# Patient Record
Sex: Female | Born: 1935 | Race: White | Hispanic: No | Smoking: Former smoker
Health system: Southern US, Community
[De-identification: ages and names within clinical notes are randomized; demographics above are authoritative.]

## PROBLEM LIST (undated history)

## (undated) DIAGNOSIS — K2970 Gastritis, unspecified, without bleeding: Secondary | ICD-10-CM

## (undated) DIAGNOSIS — E0590 Thyrotoxicosis, unspecified without thyrotoxic crisis or storm: Secondary | ICD-10-CM

## (undated) DIAGNOSIS — K449 Diaphragmatic hernia without obstruction or gangrene: Secondary | ICD-10-CM

## (undated) DIAGNOSIS — D751 Secondary polycythemia: Secondary | ICD-10-CM

## (undated) DIAGNOSIS — R0989 Other specified symptoms and signs involving the circulatory and respiratory systems: Secondary | ICD-10-CM

## (undated) DIAGNOSIS — Z860100 Personal history of colon polyps, unspecified: Secondary | ICD-10-CM

## (undated) DIAGNOSIS — Z9889 Other specified postprocedural states: Secondary | ICD-10-CM

## (undated) DIAGNOSIS — Z720 Tobacco use: Secondary | ICD-10-CM

## (undated) DIAGNOSIS — E119 Type 2 diabetes mellitus without complications: Secondary | ICD-10-CM

## (undated) DIAGNOSIS — E785 Hyperlipidemia, unspecified: Secondary | ICD-10-CM

## (undated) DIAGNOSIS — K2270 Barrett's esophagus without dysplasia: Secondary | ICD-10-CM

## (undated) DIAGNOSIS — K219 Gastro-esophageal reflux disease without esophagitis: Secondary | ICD-10-CM

## (undated) DIAGNOSIS — I10 Essential (primary) hypertension: Secondary | ICD-10-CM

## (undated) DIAGNOSIS — J189 Pneumonia, unspecified organism: Secondary | ICD-10-CM

## (undated) DIAGNOSIS — N63 Unspecified lump in unspecified breast: Secondary | ICD-10-CM

## (undated) HISTORY — DX: Gastro-esophageal reflux disease without esophagitis: K21.9

## (undated) HISTORY — PX: CHOLECYSTECTOMY: SHX55

## (undated) HISTORY — DX: Gastritis, unspecified, without bleeding: K29.70

## (undated) HISTORY — DX: Other specified symptoms and signs involving the circulatory and respiratory systems: R09.89

## (undated) HISTORY — DX: Personal history of colon polyps, unspecified: Z86.0100

## (undated) HISTORY — DX: Unspecified lump in unspecified breast: N63.0

## (undated) HISTORY — DX: Hyperlipidemia, unspecified: E78.5

## (undated) HISTORY — DX: Pneumonia, unspecified organism: J18.9

## (undated) HISTORY — PX: ABDOMINAL HYSTERECTOMY: SHX81

## (undated) HISTORY — DX: Tobacco use: Z72.0

## (undated) HISTORY — PX: APPENDECTOMY: SHX54

## (undated) HISTORY — DX: Diaphragmatic hernia without obstruction or gangrene: K44.9

## (undated) HISTORY — DX: Essential (primary) hypertension: I10

## (undated) HISTORY — DX: Personal history of colonic polyps: Z86.010

## (undated) SURGERY — Surgical Case
Anesthesia: *Unknown

## (undated) NOTE — *Deleted (*Deleted)
[facility] ([facility]) Care Management  09/07/2019  Regina Harris 07/04/1936 [id number]    Telephone Assessment-post SNF discharge  RN was able to speak briefly with the pt's granddaughter ([name]- [phone]) who provided permission to contact the pt directly. States she was very busy at this time but indicated pt has an sitter in the home that I can also speak with if needed. RN will follow up with pt at the provided contact number and further inquired on possible services.   RN spoke with pt who indicates she was doing well and able to verify her identifiers. Pt states she has assistance in the home at this time and provied permission to speak with the sitter in the home (Tina). RN provided information on the [facility] program and services.  RN offered assistance with listing pt into a program based upon the information discussed however pt does not want weekly Transition of care as indicated. Pt states she is doing very well and does not need close monitoring on a weekly bases due to the involved services with [facility] (PT/ aide and a personal sitter daily who provides 3 meals a day and administered all medications). RN verified pt has made contact with her provider since discharged from the facility and has all prescribed medications as the daughter delivery after preparing for the aide to administer. No signs or symptoms of previous UTI or coughing. RN further inquired on HTN/DM as aide unable aware these diagnosis with no encountered issues. States these diagnoses are not monitored. States pt is doing very well eating with a good appetite no GI/GU issues as pt continue to adhere to her ongoing recovery.    Again the request has been for monthly and does not wish to receive weekly transition of care calls. RN has explained and inquired further on information needed for the initial assessment as RN has been directed to contact the granddaughter for this information at a later date. Will initiate  the preventive program and notify the primary provider of pt's disposition with [facility] services.  PLAN: RN will follow up next week with the granddaughter on obtaining additional information for the initial assessment due to her busy working schedule today.   [facility] CM Care Plan Problem One     Most Recent Value  Care Plan Problem One  Preventive Measures related to recent SNF d/c  Role Documenting the Problem One  Care Management Telephonic Coordinator  Care Plan for Problem One  Active  [facility] Long Term Goal   Pt will not have any readmission to a hospital or SNF over the next 60 days.  [facility] Long Term Goal Start Date  09/07/19  Interventions for Problem One Long Term Goal  Discussed the importance of SNF/hospital prevention to avoid the risk by seek medical attention with acute issues and needs.  [facility] CM Short Term Goal #1   Adherence with medications post op SNF.  [facility] CM Short Term Goal #1 Start Date  09/07/19  Interventions for Short Term Goal #1  Discussed the importance of taking all her medications as prescribed to avoid acute issues from occuring  [facility] CM Short Term Goal #2   Adherence with all medical appointments  [facility] CM Short Term Goal #2 Start Date  09/07/19  Interventions for Short Term Goal #2  Verified pt has sufficient transportation to all medical appointments and offer additional resources such as SCATs if needed      Lisa Matthews, RN Care Management Coordinator [facility]   Office [phone] 

---

## 1994-11-05 HISTORY — PX: BREAST BIOPSY: SHX20

## 2000-08-05 ENCOUNTER — Encounter: Admission: RE | Admit: 2000-08-05 | Discharge: 2000-08-05 | Payer: Self-pay | Admitting: Surgery

## 2000-08-05 ENCOUNTER — Encounter: Payer: Self-pay | Admitting: Surgery

## 2001-08-12 ENCOUNTER — Encounter: Admission: RE | Admit: 2001-08-12 | Discharge: 2001-08-12 | Payer: Self-pay | Admitting: Surgery

## 2001-08-12 ENCOUNTER — Encounter: Payer: Self-pay | Admitting: Surgery

## 2001-08-22 ENCOUNTER — Encounter (INDEPENDENT_AMBULATORY_CARE_PROVIDER_SITE_OTHER): Payer: Self-pay | Admitting: *Deleted

## 2001-08-22 ENCOUNTER — Encounter: Admission: RE | Admit: 2001-08-22 | Discharge: 2001-08-22 | Payer: Self-pay | Admitting: Surgery

## 2001-08-22 ENCOUNTER — Encounter: Payer: Self-pay | Admitting: Surgery

## 2001-08-23 ENCOUNTER — Other Ambulatory Visit: Admission: RE | Admit: 2001-08-23 | Discharge: 2001-08-23 | Payer: Self-pay | Admitting: Surgery

## 2002-08-24 ENCOUNTER — Encounter: Payer: Self-pay | Admitting: Internal Medicine

## 2002-08-24 ENCOUNTER — Encounter: Admission: RE | Admit: 2002-08-24 | Discharge: 2002-08-24 | Payer: Self-pay | Admitting: Internal Medicine

## 2003-08-26 ENCOUNTER — Encounter: Payer: Self-pay | Admitting: Internal Medicine

## 2003-08-26 ENCOUNTER — Encounter: Admission: RE | Admit: 2003-08-26 | Discharge: 2003-08-26 | Payer: Self-pay | Admitting: Internal Medicine

## 2004-03-02 ENCOUNTER — Other Ambulatory Visit: Admission: RE | Admit: 2004-03-02 | Discharge: 2004-03-02 | Payer: Self-pay | Admitting: Family Medicine

## 2004-03-02 ENCOUNTER — Encounter: Payer: Self-pay | Admitting: Family Medicine

## 2004-03-02 LAB — CONVERTED CEMR LAB: Pap Smear: NORMAL

## 2004-04-05 LAB — HM DEXA SCAN: HM Dexa Scan: NORMAL

## 2004-08-28 ENCOUNTER — Encounter: Admission: RE | Admit: 2004-08-28 | Discharge: 2004-08-28 | Payer: Self-pay | Admitting: Family Medicine

## 2004-10-06 ENCOUNTER — Ambulatory Visit: Payer: Self-pay | Admitting: Family Medicine

## 2004-11-04 ENCOUNTER — Ambulatory Visit: Payer: Self-pay

## 2005-01-05 ENCOUNTER — Ambulatory Visit: Payer: Self-pay | Admitting: Family Medicine

## 2005-04-09 ENCOUNTER — Ambulatory Visit: Payer: Self-pay | Admitting: Family Medicine

## 2005-04-23 ENCOUNTER — Ambulatory Visit: Payer: Self-pay | Admitting: Family Medicine

## 2005-07-13 ENCOUNTER — Ambulatory Visit: Payer: Self-pay | Admitting: Family Medicine

## 2005-08-30 ENCOUNTER — Encounter: Admission: RE | Admit: 2005-08-30 | Discharge: 2005-08-30 | Payer: Self-pay | Admitting: Family Medicine

## 2005-10-05 ENCOUNTER — Ambulatory Visit: Payer: Self-pay | Admitting: Family Medicine

## 2005-10-18 ENCOUNTER — Ambulatory Visit: Payer: Self-pay | Admitting: Family Medicine

## 2005-11-29 ENCOUNTER — Ambulatory Visit: Payer: Self-pay | Admitting: Family Medicine

## 2006-03-04 ENCOUNTER — Ambulatory Visit: Payer: Self-pay | Admitting: Family Medicine

## 2006-06-10 ENCOUNTER — Ambulatory Visit: Payer: Self-pay | Admitting: Family Medicine

## 2006-06-10 LAB — CONVERTED CEMR LAB: Hgb A1c MFr Bld: 5.5 %

## 2006-06-11 ENCOUNTER — Emergency Department: Payer: Self-pay | Admitting: Emergency Medicine

## 2006-06-13 ENCOUNTER — Ambulatory Visit: Payer: Self-pay | Admitting: Urology

## 2006-06-13 ENCOUNTER — Ambulatory Visit: Payer: Self-pay | Admitting: Family Medicine

## 2006-06-17 ENCOUNTER — Ambulatory Visit: Payer: Self-pay | Admitting: Family Medicine

## 2006-09-02 ENCOUNTER — Encounter: Admission: RE | Admit: 2006-09-02 | Discharge: 2006-09-02 | Payer: Self-pay | Admitting: Family Medicine

## 2006-09-18 ENCOUNTER — Ambulatory Visit: Payer: Self-pay | Admitting: Unknown Physician Specialty

## 2006-12-18 ENCOUNTER — Ambulatory Visit: Payer: Self-pay | Admitting: Family Medicine

## 2006-12-18 LAB — CONVERTED CEMR LAB
ALT: 17 units/L (ref 0–40)
AST: 17 units/L (ref 0–37)
Albumin: 3.5 g/dL (ref 3.5–5.2)
BUN: 14 mg/dL (ref 6–23)
Basophils Absolute: 0.1 10*3/uL (ref 0.0–0.1)
Basophils Relative: 0.7 % (ref 0.0–1.0)
CO2: 31 meq/L (ref 19–32)
Calcium: 9.7 mg/dL (ref 8.4–10.5)
Chloride: 103 meq/L (ref 96–112)
Creatinine, Ser: 0.8 mg/dL (ref 0.4–1.2)
Eosinophils Absolute: 0.2 10*3/uL (ref 0.0–0.6)
Eosinophils Relative: 3 % (ref 0.0–5.0)
GFR calc Af Amer: 92 mL/min
GFR calc non Af Amer: 76 mL/min
Glucose, Bld: 106 mg/dL — ABNORMAL HIGH (ref 70–99)
HCT: 50.1 % — ABNORMAL HIGH (ref 36.0–46.0)
Hemoglobin: 17.5 g/dL — ABNORMAL HIGH (ref 12.0–15.0)
Hgb A1c MFr Bld: 6.2 % — ABNORMAL HIGH (ref 4.6–6.0)
Lymphocytes Relative: 28 % (ref 12.0–46.0)
MCHC: 34.9 g/dL (ref 30.0–36.0)
MCV: 92.3 fL (ref 78.0–100.0)
Monocytes Absolute: 0.6 10*3/uL (ref 0.2–0.7)
Monocytes Relative: 7.9 % (ref 3.0–11.0)
Neutro Abs: 4.6 10*3/uL (ref 1.4–7.7)
Neutrophils Relative %: 60.4 % (ref 43.0–77.0)
Phosphorus: 3.5 mg/dL (ref 2.3–4.6)
Platelets: 201 10*3/uL (ref 150–400)
Potassium: 3.8 meq/L (ref 3.5–5.1)
RBC: 5.42 M/uL — ABNORMAL HIGH (ref 3.87–5.11)
RDW: 11.9 % (ref 11.5–14.6)
Sodium: 142 meq/L (ref 135–145)
WBC: 7.7 10*3/uL (ref 4.5–10.5)

## 2007-03-19 ENCOUNTER — Encounter: Payer: Self-pay | Admitting: Family Medicine

## 2007-03-19 DIAGNOSIS — R0989 Other specified symptoms and signs involving the circulatory and respiratory systems: Secondary | ICD-10-CM

## 2007-03-19 DIAGNOSIS — Z860100 Personal history of colon polyps, unspecified: Secondary | ICD-10-CM | POA: Insufficient documentation

## 2007-03-19 DIAGNOSIS — I10 Essential (primary) hypertension: Secondary | ICD-10-CM | POA: Insufficient documentation

## 2007-03-19 DIAGNOSIS — F17200 Nicotine dependence, unspecified, uncomplicated: Secondary | ICD-10-CM

## 2007-04-01 ENCOUNTER — Ambulatory Visit: Payer: Self-pay | Admitting: Family Medicine

## 2007-04-02 LAB — CONVERTED CEMR LAB
Cholesterol: 173 mg/dL (ref 0–200)
Glucose, Bld: 118 mg/dL — ABNORMAL HIGH (ref 70–99)
HDL: 37.9 mg/dL — ABNORMAL LOW (ref >39.0)
Hgb A1c MFr Bld: 6.3 % — ABNORMAL HIGH (ref 4.6–6.0)
LDL Cholesterol: 108 mg/dL — ABNORMAL HIGH (ref 0–99)
Total CHOL/HDL Ratio: 4.6
Triglycerides: 137 mg/dL (ref 0–149)
VLDL: 27 mg/dL (ref 0–40)

## 2007-07-10 ENCOUNTER — Ambulatory Visit: Payer: Self-pay | Admitting: Family Medicine

## 2007-07-10 DIAGNOSIS — E7800 Pure hypercholesterolemia, unspecified: Secondary | ICD-10-CM

## 2007-07-14 LAB — CONVERTED CEMR LAB
ALT: 15 units/L (ref 0–35)
AST: 16 units/L (ref 0–37)
Cholesterol: 181 mg/dL (ref 0–200)
HDL: 36.9 mg/dL — ABNORMAL LOW (ref >39.0)
Hgb A1c MFr Bld: 6.3 % — ABNORMAL HIGH (ref 4.6–6.0)
LDL Cholesterol: 120 mg/dL — ABNORMAL HIGH (ref 0–99)
Total CHOL/HDL Ratio: 4.9
Triglycerides: 120 mg/dL (ref 0–149)
VLDL: 24 mg/dL (ref 0–40)

## 2007-11-06 HISTORY — PX: COLONOSCOPY: SHX174

## 2008-05-14 ENCOUNTER — Ambulatory Visit: Payer: Self-pay | Admitting: Family Medicine

## 2008-05-17 ENCOUNTER — Ambulatory Visit: Payer: Self-pay | Admitting: Family Medicine

## 2008-05-17 ENCOUNTER — Encounter: Payer: Self-pay | Admitting: Family Medicine

## 2008-09-14 ENCOUNTER — Encounter: Payer: Self-pay | Admitting: Family Medicine

## 2008-10-26 ENCOUNTER — Ambulatory Visit: Payer: Self-pay | Admitting: Family Medicine

## 2008-11-01 ENCOUNTER — Telehealth: Payer: Self-pay | Admitting: Family Medicine

## 2008-12-08 ENCOUNTER — Encounter: Payer: Self-pay | Admitting: Family Medicine

## 2009-01-13 ENCOUNTER — Encounter: Payer: Self-pay | Admitting: Family Medicine

## 2009-01-13 ENCOUNTER — Ambulatory Visit: Payer: Self-pay | Admitting: Unknown Physician Specialty

## 2009-01-13 LAB — HM COLONOSCOPY

## 2009-10-27 ENCOUNTER — Telehealth: Payer: Self-pay | Admitting: Family Medicine

## 2009-11-03 ENCOUNTER — Encounter: Admission: RE | Admit: 2009-11-03 | Discharge: 2009-11-03 | Payer: Self-pay | Admitting: Family Medicine

## 2009-11-07 ENCOUNTER — Encounter (INDEPENDENT_AMBULATORY_CARE_PROVIDER_SITE_OTHER): Payer: Self-pay | Admitting: *Deleted

## 2009-11-08 ENCOUNTER — Encounter: Payer: Self-pay | Admitting: Family Medicine

## 2010-01-03 ENCOUNTER — Ambulatory Visit: Payer: Self-pay | Admitting: Family Medicine

## 2010-01-03 HISTORY — PX: CATARACT EXTRACTION: SUR2

## 2010-01-13 ENCOUNTER — Ambulatory Visit: Payer: Self-pay | Admitting: Family Medicine

## 2010-01-13 LAB — HM DIABETES EYE EXAM: HM Diabetic Eye Exam: NORMAL

## 2010-01-16 DIAGNOSIS — J984 Other disorders of lung: Secondary | ICD-10-CM

## 2010-01-16 LAB — CONVERTED CEMR LAB
ALT: 18 units/L (ref 0–35)
AST: 17 units/L (ref 0–37)
Albumin: 3.6 g/dL (ref 3.5–5.2)
Alkaline Phosphatase: 53 units/L (ref 39–117)
BUN: 13 mg/dL (ref 6–23)
Basophils Absolute: 0 10*3/uL (ref 0.0–0.1)
Basophils Relative: 0.1 % (ref 0.0–3.0)
Bilirubin, Direct: 0 mg/dL (ref 0.0–0.3)
CO2: 33 meq/L — ABNORMAL HIGH (ref 19–32)
Calcium: 10.1 mg/dL (ref 8.4–10.5)
Chloride: 106 meq/L (ref 96–112)
Cholesterol: 165 mg/dL (ref 0–200)
Creatinine, Ser: 0.7 mg/dL (ref 0.4–1.2)
Creatinine,U: 72.6 mg/dL
Eosinophils Absolute: 0.2 10*3/uL (ref 0.0–0.7)
Eosinophils Relative: 1.8 % (ref 0.0–5.0)
GFR calc non Af Amer: 87.44 mL/min (ref >60)
Glucose, Bld: 115 mg/dL — ABNORMAL HIGH (ref 70–99)
HCT: 52.8 % — ABNORMAL HIGH (ref 36.0–46.0)
HDL: 45.9 mg/dL (ref >39.00)
Hemoglobin: 17.7 g/dL — ABNORMAL HIGH (ref 12.0–15.0)
Hgb A1c MFr Bld: 6.2 % (ref 4.6–6.5)
LDL Cholesterol: 90 mg/dL (ref 0–99)
Lymphocytes Relative: 20.9 % (ref 12.0–46.0)
Lymphs Abs: 1.8 10*3/uL (ref 0.7–4.0)
MCHC: 33.5 g/dL (ref 30.0–36.0)
MCV: 97.7 fL (ref 78.0–100.0)
Microalb Creat Ratio: 8.3 mg/g (ref 0.0–30.0)
Microalb, Ur: 0.6 mg/dL (ref 0.0–1.9)
Monocytes Absolute: 0.6 10*3/uL (ref 0.1–1.0)
Monocytes Relative: 7.1 % (ref 3.0–12.0)
Neutro Abs: 6.2 10*3/uL (ref 1.4–7.7)
Neutrophils Relative %: 70.1 % (ref 43.0–77.0)
Phosphorus: 3 mg/dL (ref 2.3–4.6)
Platelets: 179 10*3/uL (ref 150.0–400.0)
Potassium: 3.9 meq/L (ref 3.5–5.1)
RBC: 5.41 M/uL — ABNORMAL HIGH (ref 3.87–5.11)
RDW: 11.9 % (ref 11.5–14.6)
Sodium: 142 meq/L (ref 135–145)
TSH: 0.52 microintl units/mL (ref 0.35–5.50)
Total Bilirubin: 0.3 mg/dL (ref 0.3–1.2)
Total CHOL/HDL Ratio: 4
Total Protein: 7.1 g/dL (ref 6.0–8.3)
Triglycerides: 144 mg/dL (ref 0.0–149.0)
VLDL: 28.8 mg/dL (ref 0.0–40.0)
WBC: 8.8 10*3/uL (ref 4.5–10.5)

## 2010-01-18 ENCOUNTER — Telehealth: Payer: Self-pay | Admitting: Family Medicine

## 2010-02-13 ENCOUNTER — Encounter: Payer: Self-pay | Admitting: Family Medicine

## 2010-02-14 ENCOUNTER — Ambulatory Visit: Payer: Self-pay | Admitting: Family Medicine

## 2010-07-17 ENCOUNTER — Encounter: Payer: Self-pay | Admitting: Family Medicine

## 2010-07-17 ENCOUNTER — Ambulatory Visit: Payer: Self-pay | Admitting: Family Medicine

## 2010-07-18 ENCOUNTER — Encounter (INDEPENDENT_AMBULATORY_CARE_PROVIDER_SITE_OTHER): Payer: Self-pay | Admitting: *Deleted

## 2010-07-19 ENCOUNTER — Encounter: Payer: Self-pay | Admitting: Family Medicine

## 2010-07-24 DIAGNOSIS — D490 Neoplasm of unspecified behavior of digestive system: Secondary | ICD-10-CM | POA: Insufficient documentation

## 2010-08-02 ENCOUNTER — Encounter: Payer: Self-pay | Admitting: Family Medicine

## 2010-08-02 ENCOUNTER — Ambulatory Visit: Payer: Self-pay | Admitting: Unknown Physician Specialty

## 2010-08-03 LAB — PATHOLOGY REPORT

## 2010-08-14 ENCOUNTER — Ambulatory Visit: Payer: Self-pay | Admitting: Family Medicine

## 2010-08-14 LAB — CONVERTED CEMR LAB
ALT: 15 units/L (ref 0–35)
AST: 18 units/L (ref 0–37)
Albumin: 3.6 g/dL (ref 3.5–5.2)
BUN: 16 mg/dL (ref 6–23)
CO2: 31 meq/L (ref 19–32)
Calcium: 10.2 mg/dL (ref 8.4–10.5)
Chloride: 102 meq/L (ref 96–112)
Cholesterol: 178 mg/dL (ref 0–200)
Creatinine, Ser: 0.8 mg/dL (ref 0.4–1.2)
GFR calc non Af Amer: 73.77 mL/min (ref >60)
Glucose, Bld: 95 mg/dL (ref 70–99)
HDL: 38.5 mg/dL — ABNORMAL LOW (ref >39.00)
Hgb A1c MFr Bld: 6.4 % (ref 4.6–6.5)
LDL Cholesterol: 118 mg/dL — ABNORMAL HIGH (ref 0–99)
Phosphorus: 3.7 mg/dL (ref 2.3–4.6)
Potassium: 3.6 meq/L (ref 3.5–5.1)
Sodium: 142 meq/L (ref 135–145)
Total CHOL/HDL Ratio: 5
Triglycerides: 109 mg/dL (ref 0.0–149.0)
VLDL: 21.8 mg/dL (ref 0.0–40.0)

## 2010-08-21 ENCOUNTER — Ambulatory Visit: Payer: Self-pay | Admitting: Family Medicine

## 2010-08-21 DIAGNOSIS — R7309 Other abnormal glucose: Secondary | ICD-10-CM

## 2010-08-21 LAB — HM DIABETES FOOT EXAM

## 2010-12-05 NOTE — Letter (Signed)
Summary: [place] No Show Letter  [place] [facility]  [address] [place], [place] [address]   Phone: [phone]  Fax: [phone]    07/18/2010 MRN: [id number]  Regina Harris [address] [place], [place]  [address]   Dear [name],   Our records indicate that you missed your scheduled appointment with ______Lab_______________ on __9-12-11__________.  Please contact this office to reschedule your appointment as soon as possible.  It is important that you keep your scheduled appointments with your physician, so we can provide you the best care possible.  Please be advised that there may be a charge for "no show" appointments.    Sincerely,   [place] [facility]

## 2010-12-05 NOTE — Miscellaneous (Signed)
Summary: mammogram results  Clinical Lists Changes  Observations: Added new observation of MAMMO DUE: 11/2010 (11/07/2009 8:39) Added new observation of MAMMOGRAM: normal (11/07/2009 8:39)      Preventive Care Screening  Mammogram:    Date:  11/07/2009    Next Due:  11/2010    Results:  normal

## 2010-12-05 NOTE — Consult Note (Signed)
Summary: [facility]  [facility]   Imported By: Laural Benes 02/27/2010 15:48:48  _____________________________________________________________________  External Attachment:    Type:   Image     Comment:   External Document

## 2010-12-05 NOTE — Assessment & Plan Note (Signed)
Summary: NOT ANY BETTER   Vital Signs:  Patient profile:   75 year old female Height:      66 inches Weight:      209.25 pounds BMI:     33.90 Temp:     98.2 degrees F oral Pulse rate:   80 / minute Pulse rhythm:   regular BP sitting:   128 / 78  (left arm) Cuff size:   large  Vitals Entered By: Ozzie Hoyle LPN (March 11, 624THL [id number] AM)  History of Present Illness: is still hurting in the same spot -- in L upper chest  worse when seatbelt rubs on it or when she presses on it   no pain to take deep breath and no cough    had cataract surgery last week- that went well with no problems   no sob/ wheeze no nausea / sweating or exertional symptoms  no fever or malaise no rash or skin change     Allergies (verified): No Known Drug Allergies  Past History:  Past Medical History: Last updated: 05/14/2008 Colonic polyps, hx of Diabetes mellitus, type II Hypertension tabacco abuse carotid bruit  hyperlipidemia   Past Surgical History: Last updated: 02/05/2009 Appendectomy Cholecystectomy Hysterectomy/ BSO Lumpectomy x 2 Neg. work up for hematuria (1995) Carotid doppler o.k.-bruit (01/2004) Dexa- normal (04/2004) Pneumonia (10/2004) Colonoscopy- polyps (09/2006) colonoscopy polyps (01/2009)- hyperplastic  Family History: Last updated: 03/19/2007 Father: Renal cancer Mother: DM Siblings:   Social History: Last updated: 03/19/2007 Marital Status:  Children:  Occupation:  Current Smoker  Risk Factors: Smoking Status: current (03/19/2007)  Review of Systems General:  Denies chills, fatigue, fever, loss of appetite, malaise, and weight loss. Eyes:  Denies blurring and eye irritation. ENT:  Denies sinus pressure and sore throat. CV:  Complains of chest pain or discomfort; denies lightheadness, palpitations, shortness of breath with exertion, and swelling of feet. Resp:  Denies cough and wheezing. GI:  Denies abdominal pain, bloody stools, and change in  bowel habits. MS:  Denies joint pain and thoracic pain. Derm:  Denies itching, lesion(s), poor wound healing, and rash. Neuro:  Denies headaches, numbness, and tingling. Endo:  Denies cold intolerance and heat intolerance. Heme:  Denies abnormal bruising and bleeding.  Physical Exam  Msk:  point tender over L mid ant chest and ribs no skin change or crepitice or breast change   Impression & Recommendations:  Problem # 1:  CHEST WALL PAIN, ACUTE (ICD-786.52) Assessment Unchanged ongoing with smoker -- and no back pain (and no acute change on her x rays )  will order ct chest with and without contrast to eval further did urge to quit smoking  update if worse or any sob Her updated medication list for this problem includes:    Aspirin 325 Mg Tabs (Aspirin) .Marland Kitchen... Take one by mouth once daily  Orders: Radiology Referral (Radiology)  Problem # 2:  Hx of TOBACCO ABUSE (ICD-305.1) Assessment: Unchanged stressed need to quit with copd  pt is thinking about it - not very motivated overall discussed in detail risks of smoking, and possible outcomes including COPD, vascular dz, cancer and also respiratory infections/sinus problems  - pt voiced understanding of thhis   Problem # 3:  HYPERCHOLESTEROLEMIA, PURE (ICD-272.0) Assessment: Comment Only overdue lab and f/u lab today and f/u 73mo  Orders: Venipuncture [id number]) TLB-Lipid Panel (80061-LIPID) TLB-Renal Function Panel (80069-RENAL) TLB-CBC Platelet - w/Differential (85025-CBCD) TLB-Hepatic/Liver Function Pnl (80076-HEPATIC) TLB-TSH (Thyroid Stimulating Hormone) (84443-TSH) TLB-A1C / Hgb A1C (Glycohemoglobin) (83036-A1C) TLB-Microalbumin/Creat Ratio,  Urine (82043-MALB)  Problem # 4:  HYPERTENSION (ICD-401.9) Assessment: Unchanged overdue lab - done today bp is stable Her updated medication list for this problem includes:    Hydrochlorothiazide 25 Mg Tabs (Hydrochlorothiazide) .Marland Kitchen... Take one by mouth  daily  Orders: Venipuncture [id number]) TLB-Lipid Panel (80061-LIPID) TLB-Renal Function Panel (80069-RENAL) TLB-CBC Platelet - w/Differential (85025-CBCD) TLB-Hepatic/Liver Function Pnl (80076-HEPATIC) TLB-TSH (Thyroid Stimulating Hormone) (84443-TSH) TLB-A1C / Hgb A1C (Glycohemoglobin) (83036-A1C) TLB-Microalbumin/Creat Ratio, Urine (82043-MALB)  BP today: 128/78 Prior BP: 148/80 (01/03/2010)  Labs Reviewed: K+: 3.8 (12/18/2006) Creat: : 0.8 (12/18/2006)   Chol: 181 (07/10/2007)   HDL: 36.9 (07/10/2007)   LDL: 120 (07/10/2007)   TG: 120 (07/10/2007)  Problem # 5:  DIABETES MELLITUS, TYPE II (ICD-250.00) Assessment: Unchanged  overdue lab and f/u eats DM diet with husb utd opthy  f/u 1 mo  Her updated medication list for this problem includes:    Aspirin 325 Mg Tabs (Aspirin) .Marland Kitchen... Take one by mouth once daily  Orders: Venipuncture [id number]) TLB-Lipid Panel (80061-LIPID) TLB-Renal Function Panel (80069-RENAL) TLB-CBC Platelet - w/Differential (85025-CBCD) TLB-Hepatic/Liver Function Pnl (80076-HEPATIC) TLB-TSH (Thyroid Stimulating Hormone) (84443-TSH) TLB-A1C / Hgb A1C (Glycohemoglobin) (83036-A1C) TLB-Microalbumin/Creat Ratio, Urine (82043-MALB)  Labs Reviewed: Creat: 0.8 (12/18/2006)    Reviewed HgBA1c results: 6.3 (07/10/2007)  6.3 (04/01/2007)  Complete Medication List: 1)  Aspirin 325 Mg Tabs (Aspirin) .... Take one by mouth once daily 2)  Hydrochlorothiazide 25 Mg Tabs (Hydrochlorothiazide) .... Take one by mouth daily 3)  Fish Oil Caps (Omega-3 fatty acids caps) .... Take by mouth daily as directed 4)  Calcium-vitamin D Tabs (Calcium-vitamin d tabs) .... Take one by mouth daily 5)  Multiple Vitamins Tabs (Multiple vitamin) .... Take by mouth as directed  Patient Instructions: 1)  we will set up ct of chest at check out  2)  make best effort you can to quit smoking 3)  let me know if pain worsens or any other symptoms 4)  lab today 5)  follow up in 1 month to  review labs   Current Allergies (reviewed today): No known allergies

## 2010-12-05 NOTE — Assessment & Plan Note (Signed)
Summary: knot above breast/ alc   Vital Signs:  Patient profile:   75 year old female Height:      66 inches Weight:      209.25 pounds BMI:     33.90 Temp:     97.9 degrees F oral Pulse rate:   80 / minute Pulse rhythm:   regular BP sitting:   148 / 80  (left arm) Cuff size:   large  Vitals Entered By: Ozzie Hoyle LPN (March  1, 624THL [id number] AM)  Serial Vital Signs/Assessments:  Time      Position  BP       Pulse  Resp  Temp     By                     138/80                         Allena Earing MD   History of Present Illness: here to address knot on chest today is over L breast at times is painful- when seatbelt rubs on it 2-3 mo now  is not getting  bigger  is not movable  no rash or skin change or nipple discharge   had mam and focal dx [place] in dec- all nl  wt is up 7 lb today  still smoking  thinks about quitting some time - but not motivated or ready yet  knows the risks   bp up a bit on first check today - has been well controlled   Allergies (verified): No Known Drug Allergies  Past History:  Past Medical History: Last updated: 05/14/2008 Colonic polyps, hx of Diabetes mellitus, type II Hypertension tabacco abuse carotid bruit  hyperlipidemia   Past Surgical History: Last updated: 02/05/2009 Appendectomy Cholecystectomy Hysterectomy/ BSO Lumpectomy x 2 Neg. work up for hematuria (1995) Carotid doppler o.k.-bruit (01/2004) Dexa- normal (04/2004) Pneumonia (10/2004) Colonoscopy- polyps (09/2006) colonoscopy polyps (01/2009)- hyperplastic  Family History: Last updated: 03/19/2007 Father: Renal cancer Mother: DM Siblings:   Social History: Last updated: 03/19/2007 Marital Status:  Children:  Occupation:  Current Smoker  Risk Factors: Smoking Status: current (03/19/2007)  Review of Systems General:  Denies fatigue, fever, loss of appetite, and malaise. Eyes:  Denies blurring and eye irritation. CV:  Denies chest pain or  discomfort, lightheadness, palpitations, and shortness of breath with exertion. Resp:  Denies cough and wheezing. GI:  Denies abdominal pain, bloody stools, change in bowel habits, indigestion, and nausea. GU:  Denies discharge and dysuria. MS:  Complains of stiffness; denies cramps and muscle weakness. Derm:  Denies itching, lesion(s), poor wound healing, and rash. Neuro:  Denies numbness and tingling. Psych:  Denies anxiety and depression. Endo:  Denies cold intolerance, excessive thirst, excessive urination, and heat intolerance. Heme:  Denies abnormal bruising and bleeding.  Physical Exam  General:  overweight but generally well appearing  Head:  normocephalic, atraumatic, and no abnormalities observed.   Eyes:  vision grossly intact, pupils equal, pupils round, and pupils reactive to light.  no conjunctival pallor, injection or icterus  Mouth:  pharynx pink and moist.   Neck:  supple with full rom and no masses or thyromegally, no JVD or carotid bruit  Chest Wall:  small tender area on ant L chest wall  no  skin changes  Breasts:  no breast lumps felt  tender area in L ant cw in upper breast without skin change or crepitice no nipple d/c  Lungs:  diffusely distant bs without wheeze today Heart:  Normal rate and regular rhythm. S1 and S2 normal without gallop, murmur, click, rub or other extra sounds. Abdomen:  Bowel sounds positive,abdomen soft and non-tender without masses, organomegaly or hernias noted. Msk:  no acute joint changes  Extremities:  no cce Neurologic:  sensation intact to light touch, gait normal, and DTRs symmetrical and normal.   Skin:  SKs diffusely on back Cervical Nodes:  No lymphadenopathy noted Axillary Nodes:  No palpable lymphadenopathy Psych:  normal affect, talkative and pleasant    Impression & Recommendations:  Problem # 1:  CHEST WALL PAIN, ACUTE [id number]) Assessment New chest wall pain - also L breast with tender area  sched cxr and rib  films - esp in light of smoking  if allneg- she may be interested in surgeon exam  I could not appreciate mass today adv to quit smoking rev recent mam and [place] neg today Her updated medication list for this problem includes:    Aspirin 325 Mg Tabs (Aspirin) .Marland Kitchen... Take one by mouth once daily  Orders: Radiology Referral (Radiology)  Problem # 2:  HYPERTENSION (ICD-401.9) Assessment: Unchanged  bp better on second check today- pt is nervous Her updated medication list for this problem includes:    Hydrochlorothiazide 25 Mg Tabs (Hydrochlorothiazide) .Marland Kitchen... Take one by mouth daily  BP today: 148/80-- re check 136/80 at rest  Prior BP: 142/80 (10/26/2008)  Labs Reviewed: K+: 3.8 (12/18/2006) Creat: : 0.8 (12/18/2006)   Chol: 181 (07/10/2007)   HDL: 36.9 (07/10/2007)   LDL: 120 (07/10/2007)   TG: 120 (07/10/2007)  Problem # 3:  Hx of TOBACCO ABUSE (ICD-305.1) Assessment: Unchanged discussed in detail risks of smoking, and possible outcomes including COPD, vascular dz, cancer and also respiratory infections/sinus problems   pt voiced understanding - but states she is not ready to quit at this time Orders: Radiology Referral (Radiology)  Complete Medication List: 1)  Aspirin 325 Mg Tabs (Aspirin) .... Take one by mouth once daily 2)  Hydrochlorothiazide 25 Mg Tabs (Hydrochlorothiazide) .... Take one by mouth daily 3)  Fish Oil Caps (Omega-3 fatty acids caps) .... Take by mouth daily as directed 4)  Calcium-vitamin D Tabs (Calcium-vitamin d tabs) .... Take one by mouth daily 5)  Multiple Vitamins Tabs (Multiple vitamin) .... Take by mouth as directed  Other Orders: Flu Vaccine 14yrs + [id number]) Admin 1st Vaccine [id number])  Patient Instructions: 1)  we will set up chest x ray at check out  2)  let me know if any increase in breast pain   Current Allergies (reviewed today): No known allergies    Immunizations Administered:  Influenza Vaccine # 1:    Vaccine Type: Fluvax 3+     Site: right deltoid    Mfr: GlaxoSmithKline    Dose: 0.5 ml    Route: IM    Given by: Ozzie Hoyle LPN    Exp. Date: 05/04/2010    Lot #: [id number]    VIS given: 05/29/07 version given January 03, 2010.  Flu Vaccine Consent Questions:    Do you have a history of severe allergic reactions to this vaccine? no    Any prior history of allergic reactions to egg and/or gelatin? no    Do you have a sensitivity to the preservative Thimersol? no    Do you have a past history of Guillan-Barre Syndrome? no    Do you currently have an acute febrile illness? no    Have you  ever had a severe reaction to latex? no    Vaccine information given and explained to patient? yes    Are you currently pregnant? no

## 2010-12-05 NOTE — Procedures (Signed)
Summary: Upper GI Endoscopy by Dr.Robert [facility]  Upper GI Endoscopy by Dr[facility]Robert [facility]   Imported By: Virgia Land 08/08/2010 09:33:34  _____________________________________________________________________  External Attachment:    Type:   Image     Comment:   External Document  Appended Document: Upper GI Endoscopy by Dr.Robert [facility]    Clinical Lists Changes  Observations: Added new observation of PAST MED HX: Colonic polyps, hx of Diabetes mellitus, type II Hypertension tabacco abuse carotid bruit  hyperlipidemia  gerd / esophagitis/ HH    GI- Dr Vira Agar   (08/08/2010 19:44) Added new observation of PAST SURG HX: Appendectomy Cholecystectomy Hysterectomy/ BSO Lumpectomy x 2 Neg. work up for hematuria (1995) Carotid doppler o.k.-bruit (01/2004) Dexa- normal (04/2004) Pneumonia (10/2004) Colonoscopy- polyps (09/2006) colonoscopy polyps (01/2009)- hyperplastic 3/11 cataract surg Dr Charise Killian 9/11 CT of chest - stable nodule (thickened wall of esophagus vs artifact) EGD - 10/11 HH and gastritis and esophagitis   (08/08/2010 19:44)       Past Medical History:    Colonic polyps, hx of    Diabetes mellitus, type II    Hypertension    tabacco abuse    carotid bruit     hyperlipidemia     gerd / esophagitis/ HH                GI- Dr Vira Agar      Past Surgical History:    Appendectomy    Cholecystectomy    Hysterectomy/ BSO    Lumpectomy x 2    Neg. work up for hematuria (1995)    Carotid doppler o.k.-bruit (01/2004)    Dexa- normal (04/2004)    Pneumonia (10/2004)    Colonoscopy- polyps (09/2006)    colonoscopy polyps (01/2009)- hyperplastic    3/11 cataract surg Dr Charise Killian    9/11 CT of chest - stable nodule (thickened wall of esophagus vs artifact)    EGD - 10/11 HH and gastritis and esophagitis

## 2010-12-05 NOTE — Progress Notes (Signed)
  Phone Note From Other Clinic   Caller: Referral Coordinator Summary of Call: Called and faxxed all notes and diagnostice studies on this patient over to [facility]. Their Dr reviewed the notes and said that he thought the patient needed to see a general surgeon not Orthopedics, so they would not schedule an appt with their office. Pls advise? Initial call taken by: Haynes Bast,  January 18, 2010 12:34 PM  Follow-up for Phone Call        that is fine with me - let pt know that and I will do ref for general surgeon instead  Follow-up by: Allena Earing MD,  January 18, 2010 1:12 PM  Additional Follow-up for Phone Call Additional follow up Details #1::        Appt made with Dr Bary Castilla on 02/13/2010 at 9:15.  Additional Follow-up by: Haynes Bast,  January 20, 2010 3:10 PM

## 2010-12-05 NOTE — Assessment & Plan Note (Signed)
Summary: 6 MONTH FOLLOW UP/[facility]   Vital Signs:  Patient profile:   75 year old female Height:      66 inches Weight:      203.25 pounds BMI:     32.92 Temp:     97.6 degrees F oral Pulse rate:   84 / minute Pulse rhythm:   regular BP sitting:   110 / 66  (left arm) Cuff size:   large  Vitals Entered By: Ozzie Hoyle LPN (October 17, 624THL 9:05 AM) CC: six month f/u   History of Present Illness: here forf/u of HTN and lipids and DM wt is down 6 lb  is feeling ok overall  just had endoscopy was dx with gastritis and duodenitis  doing better now  omeprazole two times a day   bp is great at 110/66--very well controlled   DM is fair with AIC of 6.4 -- up from 6.2 not as much exercise as she should  eats sweets - once in a while  does not eat much bread / pasta or starches   tab-- about the same  thinks about quitting but not ready yet    lipids up a bit -- LDL 118 from the 90s did eat some more cholesterol   diet thinks she can eat a little   Allergies (verified): No Known Drug Allergies  Past History:  Past Medical History: Last updated: 08/08/2010 Colonic polyps, hx of Diabetes mellitus, type II Hypertension tabacco abuse carotid bruit  hyperlipidemia  gerd / esophagitis/ HH    GI- Dr Vira Agar  Past Surgical History: Last updated: 08/08/2010 Appendectomy Cholecystectomy Hysterectomy/ BSO Lumpectomy x 2 Neg. work up for hematuria (1995) Carotid doppler o.k.-bruit (01/2004) Dexa- normal (04/2004) Pneumonia (10/2004) Colonoscopy- polyps (09/2006) colonoscopy polyps (01/2009)- hyperplastic 3/11 cataract surg Dr Charise Killian 9/11 CT of chest - stable nodule (thickened wall of esophagus vs artifact) EGD - 10/11 HH and gastritis and esophagitis   Family History: Last updated: 03/19/2007 Father: Renal cancer Mother: DM Siblings:   Social History: Last updated: 03/19/2007 Marital Status:  Children:  Occupation:  Current Smoker  Risk Factors: Smoking  Status: current (03/19/2007)  Review of Systems General:  Denies fatigue, loss of appetite, and malaise. Eyes:  Denies blurring and eye irritation. CV:  Denies chest pain or discomfort, lightheadness, and palpitations. Resp:  Denies cough, shortness of breath, and wheezing. GI:  Denies abdominal pain, change in bowel habits, and indigestion. GU:  Denies discharge and urinary frequency. MS:  Denies joint pain, joint swelling, and loss of strength. Derm:  Denies itching, lesion(s), poor wound healing, and rash. Neuro:  Denies numbness and tingling. Psych:  mood is ok . Endo:  Denies cold intolerance, excessive thirst, excessive urination, and heat intolerance. Heme:  Denies abnormal bruising and bleeding.  Physical Exam  General:  overweight but generally well appearing  Head:  normocephalic, atraumatic, and no abnormalities observed.   Eyes:  vision grossly intact, pupils equal, pupils round, and pupils reactive to light.  no conjunctival pallor, injection or icterus  Mouth:  pharynx pink and moist.   Neck:  carotid bruit is stable  supple/ no M or thyromeg or jvd  Lungs:  diffusely distant bs without wheeze today Heart:  Normal rate and regular rhythm. S1 and S2 normal without gallop, murmur, click, rub or other extra sounds. Abdomen:  Bowel sounds positive,abdomen soft and non-tender without masses, organomegaly or hernias noted. no renal bruits  Msk:  No deformity or scoliosis noted of thoracic or lumbar  spine.   Pulses:  R and L carotid,radial,femoral,dorsalis pedis and posterior tibial pulses are full and equal bilaterally Extremities:  no cce Neurologic:  sensation intact to light touch, gait normal, and DTRs symmetrical and normal.   Skin:  Intact without suspicious lesions or rashes Cervical Nodes:  No lymphadenopathy noted Inguinal Nodes:  No significant adenopathy Psych:  normal affect, talkative and pleasant   Diabetes Management Exam:    Foot Exam (with socks and/or  shoes not present):       Sensory-Pinprick/Light touch:          Left medial foot (L-4): normal          Left dorsal foot (L-5): normal          Left lateral foot (S-1): normal          Right medial foot (L-4): normal          Right dorsal foot (L-5): normal          Right lateral foot (S-1): normal       Sensory-Monofilament:          Left foot: normal          Right foot: normal       Inspection:          Left foot: normal          Right foot: normal       Nails:          Left foot: normal          Right foot: normal   Impression & Recommendations:  Problem # 1:  HYPERCHOLESTEROLEMIA, PURE (ICD-272.0) Assessment Unchanged  this is up a bit and disc cv risks - esp in light of smoking rev low sat fat diet  rev labs with pt  re check 6 mo and f/u  Labs Reviewed: SGOT: 18 (08/14/2010)   SGPT: 15 (08/14/2010)   HDL:38.50 (08/14/2010), 45.90 (01/13/2010)  LDL:118 (08/14/2010), 90 (01/13/2010)  Chol:178 (08/14/2010), 165 (01/13/2010)  Trig:109.0 (08/14/2010), 144.0 (01/13/2010)  Problem # 2:  HYPERGLYCEMIA (ICD-790.29) Assessment: Unchanged  hyperglycemia is controlled with diet at this time disc imp of low glycemic diet in detail  re check AIC and f/u in 6 mo    Labs Reviewed: Creat: 0.8 (08/14/2010)     Last Eye Exam: normal (01/13/2010) Reviewed HgBA1c results: 6.4 (08/14/2010)  6.2 (01/13/2010)  Problem # 3:  Hx of TOBACCO ABUSE (ICD-305.1) Assessment: Unchanged once again - counseled briefly on smoking cessation discussed in detail risks of smoking, and possible outcomes including COPD, vascular dz, cancer and also respiratory infections/sinus problems  pt is contemplating cessation but  not ready yet  voices understanding of above  Problem # 4:  HYPERTENSION (ICD-401.9) Assessment: Unchanged  well controlled with hctz at this time  no changes  labs reviewed  Her updated medication list for this problem includes:    Hydrochlorothiazide 25 Mg Tabs  (Hydrochlorothiazide) .Marland Kitchen... Take one by mouth daily  BP today: 110/66 Prior BP: 126/74 (02/14/2010)  Labs Reviewed: K+: 3.6 (08/14/2010) Creat: : 0.8 (08/14/2010)   Chol: 178 (08/14/2010)   HDL: 38.50 (08/14/2010)   LDL: 118 (08/14/2010)   TG: 109.0 (08/14/2010)  Complete Medication List: 1)  Aspirin 325 Mg Tabs (Aspirin) .... Take one by mouth once daily 2)  Hydrochlorothiazide 25 Mg Tabs (Hydrochlorothiazide) .... Take one by mouth daily 3)  Fish Oil Caps (Omega-3 fatty acids caps) .... Take by mouth daily as directed 4)  Calcium-vitamin D Tabs (Calcium-vitamin  d tabs) .... Take one by mouth daily 5)  Multiple Vitamins Tabs (Multiple vitamin) .... Take by mouth as directed 6)  Omeprazole 20 Mg Cpdr (Omeprazole) .... One tablet by mouth twice a day 7)  Clarithromycin 500 Mg Xr24h-tab (Clarithromycin) .... Two tabs daily for 10 days 8)  Amoxicillin 500 Mg Caps (Amoxicillin) .... Two capsules by mouth twice daily for 10 days  Other Orders: Flu Vaccine 70yrs + MEDICARE PATIENTS [id number]) Administration Flu vaccine - MCR [id number])  Patient Instructions: 1)  please watch diet for sugar and fats  2)  you can raise your HDL (good cholesterol) by increasing exercise and eating omega 3 fatty acid supplement like fish oil or flax seed oil over the counter 3)  you can lower LDL (bad cholesterol) by limiting saturated fats in diet like red meat, fried foods, egg yolks, fatty breakfast meats, high fat dairy products and shellfish  4)  blood pressure is good  5)  no change in medicines  6)  schedule fasting lab and follow up in 6 months -- lipid/ ast /alt/ renal /AIc for hyperglycemia and 272 and 401.1   Orders Added: 1)  Flu Vaccine 36yrs + MEDICARE PATIENTS [Q2039] 2)  Administration Flu vaccine - MCR [[id number]] 3)  Est. Patient Level IV [id number]    Current Allergies (reviewed today): No known allergies    Flu Vaccine Consent Questions     Do you have a history of severe allergic reactions to  this vaccine? no    Any prior history of allergic reactions to egg and/or gelatin? no    Do you have a sensitivity to the preservative Thimersol? no    Do you have a past history of Guillan-Barre Syndrome? no    Do you currently have an acute febrile illness? no    Have you ever had a severe reaction to latex? no    Vaccine information given and explained to patient? yes    Are you currently pregnant? no    Lot Number:[id number]   Exp Date:05/05/2011   Site Given  Right Deltoid [id number] Ozzie Hoyle LPN  October 17, 624THL 9:41 AM

## 2010-12-05 NOTE — Letter (Signed)
Summary: Regina Harris   Imported By: Virgia Land 07/20/2010 15:11:17  _____________________________________________________________________  External Attachment:    Type:   Image     Comment:   External Document

## 2010-12-05 NOTE — Assessment & Plan Note (Signed)
Summary: 1 month follow up review labs/rbh   Vital Signs:  Patient profile:   75 year old female Height:      66 inches Weight:      209.25 pounds BMI:     33.90 Temp:     97.9 degrees F oral Pulse rate:   84 / minute Pulse rhythm:   regular BP sitting:   126 / 74  (left arm) Cuff size:   large  Vitals Entered By: Ozzie Hoyle LPN (April 12, 624THL [id number] AM) CC: one month follow up and review labs   History of Present Illness: here for f/u of HTN and lipids and mild DM2 and tab  wt is stable at bmi 33  bp well controlled with 126/74 today   DM -- AIC good at 6.2 opthy- last week also has cataract surg - Dr Precious Bard - no retinop  microalb is ok   tab-- is about the same- no plans to quit yet -- not ready   lipids are improved with trig of 144/ HDL 45 and LDL down from 120 to 90  is improving diet in generally -- not eating as much  some fatty foods   chest wall pain saw Dr Fleet Contras yesterday -- said he thinks it is more of a strain than anything  pain is on and off/ better at times  overall tolerating it   otherwise feeling good   Allergies (verified): No Known Drug Allergies  Past History:  Past Medical History: Last updated: 05/14/2008 Colonic polyps, hx of Diabetes mellitus, type II Hypertension tabacco abuse carotid bruit  hyperlipidemia   Family History: Last updated: 03/19/2007 Father: Renal cancer Mother: DM Siblings:   Social History: Last updated: 03/19/2007 Marital Status:  Children:  Occupation:  Current Smoker  Risk Factors: Smoking Status: current (03/19/2007)  Past Surgical History: Appendectomy Cholecystectomy Hysterectomy/ BSO Lumpectomy x 2 Neg. work up for hematuria (1995) Carotid doppler o.k.-bruit (01/2004) Dexa- normal (04/2004) Pneumonia (10/2004) Colonoscopy- polyps (09/2006) colonoscopy polyps (01/2009)- hyperplastic 3/11 cataract surg Dr Charise Killian  Review of Systems General:  Denies fatigue, fever, loss of appetite, and  malaise. Eyes:  Denies eye irritation. CV:  Denies chest pain or discomfort, lightheadness, palpitations, and shortness of breath with exertion. Resp:  Complains of chest pain with inspiration; denies cough, shortness of breath, and wheezing. GI:  Denies abdominal pain, change in bowel habits, indigestion, and nausea. MS:  Denies joint pain, joint redness, and joint swelling. Derm:  Denies itching, lesion(s), poor wound healing, and rash. Neuro:  Denies numbness and tingling. Psych:  Denies anxiety and depression. Endo:  Denies cold intolerance, excessive thirst, excessive urination, and heat intolerance. Heme:  Denies abnormal bruising and bleeding.  Physical Exam  General:  overweight but generally well appearing  Head:  normocephalic, atraumatic, and no abnormalities observed.   Eyes:  vision grossly intact, pupils equal, pupils round, and pupils reactive to light.  no conjunctival pallor, injection or icterus  Mouth:  pharynx pink and moist.   Neck:  carotid bruit is stable  supple/ no M or thyromeg or jvd  Chest Wall:  small tender area on ant L chest wall  no  skin changes  Lungs:  diffusely distant bs without wheeze today Heart:  Normal rate and regular rhythm. S1 and S2 normal without gallop, murmur, click, rub or other extra sounds. Abdomen:  Bowel sounds positive,abdomen soft and non-tender without masses, organomegaly or hernias noted. no renal bruits  Msk:  No deformity or scoliosis noted of  thoracic or lumbar spine.   Pulses:  R and L carotid,radial,femoral,dorsalis pedis and posterior tibial pulses are full and equal bilaterally Extremities:  no cce Neurologic:  sensation intact to light touch, gait normal, and DTRs symmetrical and normal.   Skin:  Intact without suspicious lesions or rashes is tanned  Cervical Nodes:  No lymphadenopathy noted Psych:  normal affect, talkative and pleasant   Diabetes Management Exam:    Foot Exam (with socks and/or shoes not  present):       Sensory-Pinprick/Light touch:          Left medial foot (L-4): normal          Left dorsal foot (L-5): normal          Left lateral foot (S-1): normal          Right medial foot (L-4): normal          Right dorsal foot (L-5): normal          Right lateral foot (S-1): normal       Sensory-Monofilament:          Left foot: normal          Right foot: normal       Inspection:          Left foot: normal          Right foot: normal       Nails:          Left foot: thickened          Right foot: thickened    Eye Exam:       Eye Exam done elsewhere          Date: 01/13/2010          Results: normal          Done by: Dr Precious Bard    Impression & Recommendations:  Problem # 1:  HYPERCHOLESTEROLEMIA, PURE (ICD-272.0) Assessment Improved  overall improved and commended on that with LDL of 90  goal is 70- may be able to get there with diet disc low sat fat foods and what to avoid f/u in 6 mo after lab   Labs Reviewed: SGOT: 17 (01/13/2010)   SGPT: 18 (01/13/2010)   HDL:45.90 (01/13/2010), 36.9 (07/10/2007)  LDL:90 (01/13/2010), 120 (07/10/2007)  Chol:165 (01/13/2010), 181 (07/10/2007)  Trig:144.0 (01/13/2010), 120 (07/10/2007)  Problem # 2:  CHEST WALL PAIN, ACUTE (ICD-786.52) Assessment: Improved suspect sprain-/ strain- is slowly imp pending surgeon note  chest x ray and CT are ok  enc smoking cessaton Her updated medication list for this problem includes:    Aspirin 325 Mg Tabs (Aspirin) .Marland Kitchen... Take one by mouth once daily  Problem # 3:  Hx of TOBACCO ABUSE (ICD-305.1) Assessment: Comment Only discussed in detail risks of smoking, and possible outcomes including COPD, vascular dz, cancer and also respiratory infections/sinus problems   pt voiced understanding is not ready to quit yet   Problem # 4:  HYPERTENSION (ICD-401.9) Assessment: Unchanged  very good control with hct now  disc exercise and wt loss rev all labs re check lab and f/u in 6 mo  Her  updated medication list for this problem includes:    Hydrochlorothiazide 25 Mg Tabs (Hydrochlorothiazide) .Marland Kitchen... Take one by mouth daily  BP today: 126/74 Prior BP: 128/78 (01/13/2010)  Labs Reviewed: K+: 3.9 (01/13/2010) Creat: : 0.7 (01/13/2010)   Chol: 165 (01/13/2010)   HDL: 45.90 (01/13/2010)   LDL: 90 (01/13/2010)   TG: 144.0 (01/13/2010)  Problem # 5:  DIABETES MELLITUS, TYPE II (ICD-250.00) Assessment: Unchanged  overall control is good with diet  opthy up to date foot care good  no symptoms disc healthy diet (low simple sugar/ choose complex carbs/ low sat fat) diet and exercise in detail  lab and f/u 6 mo  Her updated medication list for this problem includes:    Aspirin 325 Mg Tabs (Aspirin) .Marland Kitchen... Take one by mouth once daily  Labs Reviewed: Creat: 0.7 (01/13/2010)    Reviewed HgBA1c results: 6.2 (01/13/2010)  6.3 (07/10/2007)  Complete Medication List: 1)  Aspirin 325 Mg Tabs (Aspirin) .... Take one by mouth once daily 2)  Hydrochlorothiazide 25 Mg Tabs (Hydrochlorothiazide) .... Take one by mouth daily 3)  Fish Oil Caps (Omega-3 fatty acids caps) .... Take by mouth daily as directed 4)  Calcium-vitamin D Tabs (Calcium-vitamin d tabs) .... Take one by mouth daily 5)  Multiple Vitamins Tabs (Multiple vitamin) .... Take by mouth as directed  Patient Instructions: 1)  you can raise your HDL (good cholesterol) by increasing exercise and eating omega 3 fatty acid supplement like fish oil or flax seed oil over the counter 2)  you can lower LDL (bad cholesterol) by limiting saturated fats in diet like red meat, fried foods, egg yolks, fatty breakfast meats, high fat dairy products and shellfish  3)  if you make these changes- we could get cholesterol to goal without medication  4)  labs are ok  5)  work on weight loss  6)  keep thinking about quitting smoking  7)  schedule fasting lab and f/u in 6 months lipid/ast/alt/renal / AIC 250.0, 272 , 401.1   Current Allergies  (reviewed today): No known allergies

## 2010-12-06 ENCOUNTER — Other Ambulatory Visit: Payer: Self-pay | Admitting: Family Medicine

## 2010-12-06 DIAGNOSIS — Z1231 Encounter for screening mammogram for malignant neoplasm of breast: Secondary | ICD-10-CM

## 2010-12-06 DIAGNOSIS — Z1239 Encounter for other screening for malignant neoplasm of breast: Secondary | ICD-10-CM

## 2010-12-14 ENCOUNTER — Ambulatory Visit
Admission: RE | Admit: 2010-12-14 | Discharge: 2010-12-14 | Disposition: A | Discharge status: Home / Self Care | Payer: Medicare Other | Source: Ambulatory Visit | Attending: Family Medicine | Admitting: Family Medicine

## 2010-12-14 DIAGNOSIS — Z1231 Encounter for screening mammogram for malignant neoplasm of breast: Secondary | ICD-10-CM

## 2010-12-14 LAB — HM MAMMOGRAPHY: HM Mammogram: NORMAL

## 2010-12-18 ENCOUNTER — Encounter (INDEPENDENT_AMBULATORY_CARE_PROVIDER_SITE_OTHER): Payer: Self-pay | Admitting: *Deleted

## 2010-12-27 NOTE — Letter (Signed)
Summary: Results Follow up Letter  [place] [facility]  [address] [place], [place] [address]   Phone: [phone]  Fax: [phone]    12/18/2010 MRN: [id number]  [place] [address], [place]  [address]  Dear Ms. Altergott,  The following are the results of your recent test(s):  Test         Result    Pap Smear:        Normal _____  Not Normal _____ Comments: ______________________________________________________ Cholesterol: LDL(Bad cholesterol):         Your goal is less than:         HDL (Good cholesterol):       Your goal is more than: Comments:  ______________________________________________________ Mammogram:        Normal __X___  Not Normal _____ Comments: Repeat in 1 year.  ___________________________________________________________________ Hemoccult:        Normal _____  Not normal _______ Comments:    _____________________________________________________________________ Other Tests:    We routinely do not discuss normal results over the telephone.  If you desire a copy of the results, or you have any questions about this information we can discuss them at your next office visit.   Sincerely,      Sheldon Silvan for Dr. Loura Pardon

## 2010-12-27 NOTE — Miscellaneous (Signed)
Summary: Mammogram to flow sheet  Clinical Lists Changes  Observations: Added new observation of MAMMO DUE: 12/2011 (12/18/2010 14:45) Added new observation of MAMMOGRAM: normal (12/14/2010 14:45)      Preventive Care Screening  Mammogram:    Date:  12/14/2010    Next Due:  12/2011    Results:  normal

## 2011-01-06 ENCOUNTER — Encounter: Payer: Self-pay | Admitting: Family Medicine

## 2011-02-14 ENCOUNTER — Other Ambulatory Visit: Payer: Self-pay | Admitting: Family Medicine

## 2011-02-14 DIAGNOSIS — I10 Essential (primary) hypertension: Secondary | ICD-10-CM

## 2011-02-14 DIAGNOSIS — E7800 Pure hypercholesterolemia, unspecified: Secondary | ICD-10-CM

## 2011-02-19 ENCOUNTER — Other Ambulatory Visit (INDEPENDENT_AMBULATORY_CARE_PROVIDER_SITE_OTHER): Payer: Medicare Other | Admitting: Family Medicine

## 2011-02-19 DIAGNOSIS — E7800 Pure hypercholesterolemia, unspecified: Secondary | ICD-10-CM

## 2011-02-19 DIAGNOSIS — I10 Essential (primary) hypertension: Secondary | ICD-10-CM

## 2011-02-19 LAB — RENAL FUNCTION PANEL
Albumin: 3.4 g/dL — ABNORMAL LOW (ref 3.5–5.2)
BUN: 20 mg/dL (ref 6–23)
CO2: 31 mEq/L (ref 19–32)
Calcium: 10.3 mg/dL (ref 8.4–10.5)
Chloride: 101 mEq/L (ref 96–112)
Creatinine, Ser: 0.8 mg/dL (ref 0.4–1.2)
GFR: 71.61 mL/min (ref >60.00)
Glucose, Bld: 105 mg/dL — ABNORMAL HIGH (ref 70–99)
Phosphorus: 3.5 mg/dL (ref 2.3–4.6)
Potassium: 3.5 mEq/L (ref 3.5–5.1)
Sodium: 140 mEq/L (ref 135–145)

## 2011-02-19 LAB — LIPID PANEL
Cholesterol: 179 mg/dL (ref 0–200)
HDL: 47 mg/dL (ref >39.00)
LDL Cholesterol: 115 mg/dL — ABNORMAL HIGH (ref 0–99)
Total CHOL/HDL Ratio: 4
Triglycerides: 84 mg/dL (ref 0.0–149.0)
VLDL: 16.8 mg/dL (ref 0.0–40.0)

## 2011-02-19 LAB — AST: AST: 17 U/L (ref 0–37)

## 2011-02-19 LAB — ALT: ALT: 16 U/L (ref 0–35)

## 2011-02-19 LAB — HEMOGLOBIN A1C: Hgb A1c MFr Bld: 6.6 % — ABNORMAL HIGH (ref 4.6–6.5)

## 2011-02-21 ENCOUNTER — Ambulatory Visit: Payer: Self-pay | Admitting: Family Medicine

## 2011-02-25 ENCOUNTER — Other Ambulatory Visit: Payer: Self-pay | Admitting: Family Medicine

## 2011-03-12 ENCOUNTER — Encounter: Payer: Self-pay | Admitting: Family Medicine

## 2011-03-12 ENCOUNTER — Ambulatory Visit (INDEPENDENT_AMBULATORY_CARE_PROVIDER_SITE_OTHER): Payer: Medicare Other | Admitting: Family Medicine

## 2011-03-12 VITALS — BP 128/70 | HR 72 | Temp 97.9°F | Ht 66.0 in | Wt 200.2 lb

## 2011-03-12 DIAGNOSIS — R739 Hyperglycemia, unspecified: Secondary | ICD-10-CM | POA: Insufficient documentation

## 2011-03-12 DIAGNOSIS — F17200 Nicotine dependence, unspecified, uncomplicated: Secondary | ICD-10-CM

## 2011-03-12 DIAGNOSIS — R7303 Prediabetes: Secondary | ICD-10-CM | POA: Insufficient documentation

## 2011-03-12 DIAGNOSIS — E119 Type 2 diabetes mellitus without complications: Secondary | ICD-10-CM

## 2011-03-12 DIAGNOSIS — I10 Essential (primary) hypertension: Secondary | ICD-10-CM

## 2011-03-12 DIAGNOSIS — E7800 Pure hypercholesterolemia, unspecified: Secondary | ICD-10-CM

## 2011-03-12 NOTE — Assessment & Plan Note (Signed)
Greatly commended on being quit for 2 days! Sounds motivated  Disc what to expect

## 2011-03-12 NOTE — Progress Notes (Signed)
Subjective:    Patient ID: Regina Harris, female    DOB: February 22, 1936, 75 y.o.   MRN: [id number]  HPI Here for f/u of hyperlipidemia and HTN and tab abuse and hyperglycemia  Also obese Feeling fine overall   Has not smoked in 2 days -- decided to stop  Feeling fine overall  No nicotine withdrawal yet  Has had never quit before -not as bad as before  Wants to get her daughter to quit too   Wt is down 2 lb with high bmi  HTN - in good control 128/70 today No cp or edema or headaches  Lipids are fairly stable and overall fair LDL 115- a little improved and HDL over 40 Lab Results  Component Value Date   CHOL 179 02/19/2011   CHOL 178 08/14/2010   CHOL 165 01/13/2010   Lab Results  Component Value Date   HDL 47.00 02/19/2011   HDL 38.50* 08/14/2010   HDL 45.90 01/13/2010   Lab Results  Component Value Date   LDLCALC 115* 02/19/2011   LDLCALC 118* 08/14/2010   LDLCALC 90 01/13/2010   Lab Results  Component Value Date   TRIG 84.0 02/19/2011   TRIG 109.0 08/14/2010   TRIG 144.0 01/13/2010   Lab Results  Component Value Date   CHOLHDL 4 02/19/2011   CHOLHDL 5 08/14/2010   CHOLHDL 4 01/13/2010   No results found for this basename: LDLDIRECT    Hyperglycemia is a bit worse with Aic of 6.6 up from 6.4 This puts her into DM range She went to DM classes with her husband - and remembers what to do  Thinks she could give up sweets  Does not eat a lot of bread and no potato  Likes her green vegetables  Does a little walking   Past Medical History  Diagnosis Date  . History of colonic polyps   . Diabetes mellitus   . Hypertension   . Tobacco abuse   . Carotid bruit   . Hyperlipidemia   . GERD (gastroesophageal reflux disease)   . Esophagitis   . HH (hiatus hernia)   . Pneumonia     History   Social History  . Marital Status: Married    Spouse Name: N/A    Number of Children: N/A  . Years of Education: N/A   Occupational History  . Not on file.   Social History  Main Topics  . Smoking status: Former Smoker    Quit date: 03/21/1911  . Smokeless tobacco: Not on file  . Alcohol Use: Not on file  . Drug Use: Not on file  . Sexually Active: Not on file   Other Topics Concern  . Not on file   Social History Narrative  . No narrative on file    Family History  Problem Relation Age of Onset  . Kidney cancer Father   . Diabetes Mother         Review of Systems Review of Systems  Constitutional: Negative for fever, appetite change, fatigue and unexpected weight change.  Eyes: Negative for pain and visual disturbance.  Respiratory: Negative for cough and shortness of breath.   Cardiovascular: Negative for cp or sob or edema   Gastrointestinal: Negative for nausea, diarrhea and constipation.  Genitourinary: Negative for urgency and frequency.  Skin: Negative for pallor.  Neurological: Negative for weakness, light-headedness, numbness and headaches.  Hematological: Negative for adenopathy. Does not bruise/bleed easily.  Psychiatric/Behavioral: Negative for dysphoric mood. The patient is not nervous/anxious.  Objective:   Physical Exam  Constitutional: She appears well-developed and well-nourished. No distress.       overwt and well appearing   HENT:  Head: Normocephalic and atraumatic.  Eyes: Conjunctivae and EOM are normal. Pupils are equal, round, and reactive to light.  Neck: Normal range of motion. Neck supple. No JVD present. Carotid bruit is present. No thyromegaly present.  Cardiovascular: Normal rate, regular rhythm and normal heart sounds.   Pulmonary/Chest: Effort normal and breath sounds normal. No respiratory distress. She has no wheezes.       Diffusely distant bs   Abdominal: Soft. Bowel sounds are normal. She exhibits no distension, no abdominal bruit and no mass. There is no tenderness.  Musculoskeletal: She exhibits no edema and no tenderness.  Lymphadenopathy:    She has no cervical adenopathy.    Neurological: She is alert. She has normal reflexes. Coordination normal.  Skin: Skin is warm and dry. No rash noted. No erythema. No pallor.  Psychiatric: She has a normal mood and affect.          Assessment & Plan:

## 2011-03-12 NOTE — Assessment & Plan Note (Signed)
bp is stable No change in meds  Disc imp of exercise

## 2011-03-12 NOTE — Patient Instructions (Signed)
Great job so far with quitting smoking! Keep up the good work  For diabetes - stop sweets and sugar drinks and juices Work hard on weight loss -- with exercise 5 days per week 20-30 minutes Also cut portions of starches (bread/ pasta/ rice/ potato)- to 2-3 oz per meal  Eat small servings of fruit Lots of green vegatables Schedule non fasting labs in 3 months for a1c and then follow up

## 2011-03-12 NOTE — Assessment & Plan Note (Signed)
New DM with a1c of 6.6 Pt declined DM teaching as she has gone with husb Disc inc exercise to 5 d per week Disc wt loss plan and low glycemic diet a1c and f/u in 3 mo

## 2011-03-12 NOTE — Assessment & Plan Note (Signed)
This is overall stable Would like LDL to be under 100 in light of Dm status  Disc low sat fat diet

## 2011-06-07 ENCOUNTER — Other Ambulatory Visit (INDEPENDENT_AMBULATORY_CARE_PROVIDER_SITE_OTHER): Payer: Medicare Other

## 2011-06-07 DIAGNOSIS — E119 Type 2 diabetes mellitus without complications: Secondary | ICD-10-CM

## 2011-06-07 LAB — HEMOGLOBIN A1C: Hgb A1c MFr Bld: 6.1 % (ref 4.6–6.5)

## 2011-06-12 ENCOUNTER — Encounter: Payer: Self-pay | Admitting: Family Medicine

## 2011-06-12 ENCOUNTER — Ambulatory Visit (INDEPENDENT_AMBULATORY_CARE_PROVIDER_SITE_OTHER): Payer: Medicare Other | Admitting: Family Medicine

## 2011-06-12 DIAGNOSIS — E119 Type 2 diabetes mellitus without complications: Secondary | ICD-10-CM

## 2011-06-12 DIAGNOSIS — E7800 Pure hypercholesterolemia, unspecified: Secondary | ICD-10-CM

## 2011-06-12 DIAGNOSIS — F17200 Nicotine dependence, unspecified, uncomplicated: Secondary | ICD-10-CM

## 2011-06-12 DIAGNOSIS — I10 Essential (primary) hypertension: Secondary | ICD-10-CM

## 2011-06-12 NOTE — Patient Instructions (Signed)
Keep cutting portions Drink water and avoid other beverages  Eat sweets only on holidays/ special occasions  Start exercise at 5 minutes a day-  Gradually work up to 10 and then 20 --- at least 5 days per week Walk outside or get video to walk in front in front of TV  Schedule fasting lab and then follow up in 6 months

## 2011-06-12 NOTE — Assessment & Plan Note (Signed)
Good control No change in med Disc gradual inc in exercise

## 2011-06-12 NOTE — Progress Notes (Signed)
Subjective:    Patient ID: Regina Harris, female    DOB: 1936-07-14, 75 y.o.   MRN: [id number]  HPI Here for f/u of new DM and smoking   HTN in good control 118/72  Last visit disc a1c of 6.6 and diet/ exercise to change that Despite wt gain of 2 lb -- a1c down to 6.1-- good!! Cutting portions in general  Also cut out sweet drinks and has a lot of water  Does eat sweets -- maybe once per week  Is prettty careful with portions of starches  Not exercising  Can walk 5-10 minutes  Is busy running after children - until they go back to school   Smoking - has cut back a whole lot  4-5 cig per day - doing ok with that  Some days does not smoke any Thinks she is ready to quit   Patient Active Problem List  Diagnoses  . NEOPLASM UNSPECIFIED NATURE DIGESTIVE SYSTEM  . HYPERCHOLESTEROLEMIA, PURE  . TOBACCO ABUSE  . HYPERTENSION  . LUNG NODULE  . CAROTID BRUIT, RIGHT  . COLONIC POLYPS, HX OF  . Diabetes type 2, controlled   Past Medical History  Diagnosis Date  . History of colonic polyps   . Diabetes mellitus   . Hypertension   . Tobacco abuse   . Carotid bruit   . Hyperlipidemia   . GERD (gastroesophageal reflux disease)   . Esophagitis   . HH (hiatus hernia)   . Pneumonia    Past Surgical History  Procedure Date  . Appendectomy   . Cholecystectomy   . Abdominal hysterectomy   . Cataract extraction 3/11    Dr Charise Killian   History  Substance Use Topics  . Smoking status: Current Everyday Smoker -- 0.3 packs/day    Last Attempt to Quit: 03/21/1911  . Smokeless tobacco: Not on file  . Alcohol Use: Not on file   Family History  Problem Relation Age of Onset  . Kidney cancer Father   . Diabetes Mother    No Known Allergies Current Outpatient Prescriptions on File Prior to Visit  Medication Sig Dispense Refill  . aspirin 325 MG tablet Take 325 mg by mouth daily.        Marland Kitchen CALCIUM-VITAMIN D PO Take 1 tablet by mouth daily.        . hydrochlorothiazide 25 MG tablet  TAKE ONE BY MOUTH DAILY  30 tablet  6  . Multiple Vitamin (MULTIVITAMIN) tablet Take 1 tablet by mouth daily.        . Omega-3 Fatty Acids (FISH OIL) 1000 MG CAPS Take 1 capsule by mouth as directed.        . pantoprazole (PROTONIX) 40 MG tablet Take 40 mg by mouth daily.        Marland Kitchen omeprazole (PRILOSEC) 20 MG capsule Take 20 mg by mouth 2 (two) times daily.          Review of Systems Review of Systems  Constitutional: Negative for fever, appetite change, fatigue and unexpected weight change.  Eyes: Negative for pain and visual disturbance.  Respiratory: Negative for cough and shortness of breath.   Cardiovascular: Negative.for cp or palpitations   Gastrointestinal: Negative for nausea, diarrhea and constipation.  Genitourinary: Negative for urgency and frequency.  Skin: Negative for pallor. or rash Neurological: Negative for weakness, light-headedness, numbness and headaches.  Hematological: Negative for adenopathy. Does not bruise/bleed easily.  Psychiatric/Behavioral: Negative for dysphoric mood. The patient is not nervous/anxious.  Objective:   Physical Exam  Constitutional: She appears well-developed and well-nourished. No distress.       overwt and well appearing   HENT:  Head: Normocephalic and atraumatic.  Mouth/Throat: Oropharynx is clear and moist.  Eyes: Conjunctivae and EOM are normal. Pupils are equal, round, and reactive to light. Right eye exhibits no discharge. Left eye exhibits no discharge.  Neck: Normal range of motion. Neck supple. No JVD present. Carotid bruit is not present. Erythema present. No thyromegaly present.  Cardiovascular: Normal rate, regular rhythm, normal heart sounds and intact distal pulses.   Pulmonary/Chest: Effort normal and breath sounds normal. No respiratory distress. She has no wheezes. She exhibits no tenderness.       Diffusely distant bs   Abdominal: Soft. Bowel sounds are normal. She exhibits no distension, no abdominal bruit and  no mass. There is no tenderness.  Musculoskeletal: Normal range of motion. She exhibits no edema and no tenderness.  Lymphadenopathy:    She has no cervical adenopathy.  Neurological: She is alert. She has normal reflexes. No cranial nerve deficit. Coordination normal.  Skin: Skin is warm and dry. No rash noted. No erythema. No pallor.  Psychiatric: She has a normal mood and affect.          Assessment & Plan:

## 2011-06-12 NOTE — Assessment & Plan Note (Signed)
Improved and I am thrilled about this  Disc plan to further cut sugar in diet and exercise a1c down to 6.1  Lab and f/u 6 months

## 2011-06-12 NOTE — Assessment & Plan Note (Signed)
Commended on cutting down  Has not set a quit date Disc in detail risks of smoking and possible outcomes including copd, vascular/ heart disease, cancer , respiratory and sinus infections  Pt voices understanding  Given info on [facility] with dates

## 2011-06-19 ENCOUNTER — Encounter: Payer: Self-pay | Admitting: Family Medicine

## 2011-06-19 ENCOUNTER — Ambulatory Visit (INDEPENDENT_AMBULATORY_CARE_PROVIDER_SITE_OTHER): Payer: Medicare Other | Admitting: Family Medicine

## 2011-06-19 VITALS — BP 140/70 | HR 108 | Temp 98.3°F | Ht 67.0 in | Wt 200.8 lb

## 2011-06-19 DIAGNOSIS — J40 Bronchitis, not specified as acute or chronic: Secondary | ICD-10-CM

## 2011-06-19 MED ORDER — ALBUTEROL SULFATE HFA 108 (90 BASE) MCG/ACT IN AERS: 2.0000 | INHALATION_SPRAY | Freq: Four times a day (QID) | RESPIRATORY_TRACT | Status: DC | PRN

## 2011-06-19 MED ORDER — MOXIFLOXACIN HCL 400 MG PO TABS: 400.0000 mg | ORAL_TABLET | Freq: Every day | ORAL | Status: AC

## 2011-06-19 NOTE — Progress Notes (Signed)
Subjective:    Patient ID: Regina Harris, female    DOB: [birth year], 75 y.o.   MRN: [id number]  HPI 75 yo new to me here for cough, congestion, wheezing.  Started last week as some mild congestion. Does not have a h/o COPD or PNA. She is however a smoker.  Last few days, she can feel herself wheezing and cannot catch her breath. Cough is productive of yellow mucous.  No fever that she is aware of.   Patient Active Problem List  Diagnoses  . NEOPLASM UNSPECIFIED NATURE DIGESTIVE SYSTEM  . HYPERCHOLESTEROLEMIA, PURE  . TOBACCO ABUSE  . HYPERTENSION  . LUNG NODULE  . CAROTID BRUIT, RIGHT  . COLONIC POLYPS, HX OF  . Diabetes type 2, controlled   Past Medical History  Diagnosis Date  . History of colonic polyps   . Diabetes mellitus   . Hypertension   . Tobacco abuse   . Carotid bruit   . Hyperlipidemia   . GERD (gastroesophageal reflux disease)   . Esophagitis   . HH (hiatus hernia)   . Pneumonia    Past Surgical History  Procedure Date  . Appendectomy   . Cholecystectomy   . Abdominal hysterectomy   . Cataract extraction 3/11    Dr Charise Killian   History  Substance Use Topics  . Smoking status: Current Everyday Smoker -- 0.3 packs/day    Last Attempt to Quit: 03/21/1911  . Smokeless tobacco: Not on file  . Alcohol Use: Not on file   Family History  Problem Relation Age of Onset  . Kidney cancer Father   . Diabetes Mother    No Known Allergies Current Outpatient Prescriptions on File Prior to Visit  Medication Sig Dispense Refill  . aspirin 325 MG tablet Take 325 mg by mouth daily.        Marland Kitchen CALCIUM-VITAMIN D PO Take 1 tablet by mouth daily.        . hydrochlorothiazide 25 MG tablet TAKE ONE BY MOUTH DAILY  30 tablet  6  . Multiple Vitamin (MULTIVITAMIN) tablet Take 1 tablet by mouth daily.        . Omega-3 Fatty Acids (FISH OIL) 1000 MG CAPS Take 1 capsule by mouth as directed.        . pantoprazole (PROTONIX) 40 MG tablet Take 40 mg by mouth daily.            The PMH, PSH, Social History, Family History, Medications, and allergies have been reviewed in [facility], and have been updated if relevant.  Review of Systems See HPI Cardiovascular: Negative.for cp or palpitations   Gastrointestinal: Negative for nausea, diarrhea and constipation.  Skin: Negative for pallor. or rash      Objective:   Physical Exam  BP 140/70  Pulse 108  Temp(Src) 98.3 F (36.8 C) (Oral)  Ht 5\' 7"  (1.702 m)  Wt 200 lb 12.8 oz (91.082 kg)  BMI 31.45 kg/m2  SpO2 93% O2 sats increased to 95%, HR 82 after albuterol/atrovent nebs.  Constitutional: She appears well-developed and well-nourished. No distress.  HENT:  Head: Normocephalic and atraumatic.  Mouth/Throat: +PND Eyes: Conjunctivae and EOM are normal. Pupils are equal, round, and reactive to light. Right eye exhibits no discharge. Left eye exhibits no discharge.  Neck: Normal range of motion. Neck supple. No JVD present. Carotid bruit is not present. Erythema present. No thyromegaly present.  Cardiovascular: Normal rate, regular rhythm, normal heart sounds and intact distal pulses.   Pulmonary/Chest: No increased WOB, +diffuse exp  wheeze, rhonchi RLL  Abdominal: Soft. Bowel sounds are normal. She exhibits no distension, no abdominal bruit and no mass. There is no tenderness.  Musculoskeletal: Normal range of motion. She exhibits no edema and no tenderness.  Lymphadenopathy:    She has no cervical adenopathy.  Neurological: She is alert. She has normal reflexes. No cranial nerve deficit. Coordination normal.  Skin: Skin is warm and dry. No rash noted. No erythema. No pallor.  Psychiatric: She has a normal mood and affect.      Assessment & Plan:   1. Bronchitis   with ?PNA. Will not get CXR as it will not change treatment. Will place on Avelox 400 mg x 7 days and albuterol inh. The patient indicates understanding of these issues and agrees with the plan.

## 2011-08-22 ENCOUNTER — Other Ambulatory Visit: Payer: Self-pay | Admitting: *Deleted

## 2011-08-22 MED ORDER — PANTOPRAZOLE SODIUM 40 MG PO TBEC: 40.0000 mg | DELAYED_RELEASE_TABLET | Freq: Every day | ORAL | Status: DC

## 2011-09-03 ENCOUNTER — Other Ambulatory Visit: Payer: Self-pay | Admitting: Family Medicine

## 2011-09-03 NOTE — Telephone Encounter (Signed)
Left message on machine to call back  

## 2011-09-03 NOTE — Telephone Encounter (Signed)
Called a week ago for refills and she received a call from local [facility] that it was ready.  She wanted to see if the refill can be sent to [place] because it will be less.  The [facility] number is [phone].  Please call patient to confirm med refill request info at [phone].  Thanks

## 2011-09-04 ENCOUNTER — Other Ambulatory Visit: Payer: Self-pay

## 2011-09-04 MED ORDER — PANTOPRAZOLE SODIUM 40 MG PO TBEC: 40.0000 mg | DELAYED_RELEASE_TABLET | Freq: Every day | ORAL | Status: DC

## 2011-09-04 NOTE — Telephone Encounter (Signed)
[facility] cancelled protonix at their location and cannot forward to [facility]. Medication sent electronically to [facility] as instructed. Patient notified as instructed by telephone.

## 2011-09-04 NOTE — Telephone Encounter (Signed)
Pt called and wanted med sent to [facility] instead of [place]. Mikki Santee at [facility] said he cannot transfer but will cancel refills at [place]. Medication sent electronically to [facility] as instructed. Patient notified as instructed by telephone.

## 2011-10-05 ENCOUNTER — Other Ambulatory Visit: Payer: Self-pay | Admitting: Family Medicine

## 2011-10-05 NOTE — Telephone Encounter (Signed)
[facility] [address] request refill [id number] #[id number] x 6.

## 2011-11-06 DIAGNOSIS — K2970 Gastritis, unspecified, without bleeding: Secondary | ICD-10-CM

## 2011-11-06 HISTORY — PX: UPPER GI ENDOSCOPY: SHX6162

## 2011-11-06 HISTORY — DX: Gastritis, unspecified, without bleeding: K29.70

## 2011-12-11 ENCOUNTER — Other Ambulatory Visit (INDEPENDENT_AMBULATORY_CARE_PROVIDER_SITE_OTHER): Payer: Medicare Other

## 2011-12-11 DIAGNOSIS — E119 Type 2 diabetes mellitus without complications: Secondary | ICD-10-CM

## 2011-12-11 DIAGNOSIS — I10 Essential (primary) hypertension: Secondary | ICD-10-CM | POA: Diagnosis not present

## 2011-12-11 DIAGNOSIS — E7800 Pure hypercholesterolemia, unspecified: Secondary | ICD-10-CM | POA: Diagnosis not present

## 2011-12-11 LAB — COMPREHENSIVE METABOLIC PANEL
ALT: 14 U/L (ref 0–35)
AST: 18 U/L (ref 0–37)
Alkaline Phosphatase: 56 U/L (ref 39–117)
Sodium: 141 mEq/L (ref 135–145)
Total Bilirubin: 1 mg/dL (ref 0.3–1.2)
Total Protein: 7.1 g/dL (ref 6.0–8.3)

## 2011-12-11 LAB — LIPID PANEL
HDL: 45.8 mg/dL (ref >39.00)
LDL Cholesterol: 116 mg/dL — ABNORMAL HIGH (ref 0–99)
Total CHOL/HDL Ratio: 4
VLDL: 27.4 mg/dL (ref 0.0–40.0)

## 2011-12-11 LAB — HEMOGLOBIN A1C: Hgb A1c MFr Bld: 6.3 % (ref 4.6–6.5)

## 2011-12-17 ENCOUNTER — Ambulatory Visit: Payer: Medicare Other | Admitting: Family Medicine

## 2011-12-24 ENCOUNTER — Encounter: Payer: Self-pay | Admitting: Family Medicine

## 2011-12-24 ENCOUNTER — Ambulatory Visit (INDEPENDENT_AMBULATORY_CARE_PROVIDER_SITE_OTHER): Payer: Medicare Other | Admitting: Family Medicine

## 2011-12-24 VITALS — BP 116/72 | HR 80 | Temp 97.9°F | Ht 67.0 in | Wt 202.2 lb

## 2011-12-24 DIAGNOSIS — E669 Obesity, unspecified: Secondary | ICD-10-CM

## 2011-12-24 DIAGNOSIS — F17200 Nicotine dependence, unspecified, uncomplicated: Secondary | ICD-10-CM | POA: Diagnosis not present

## 2011-12-24 DIAGNOSIS — R739 Hyperglycemia, unspecified: Secondary | ICD-10-CM

## 2011-12-24 DIAGNOSIS — I10 Essential (primary) hypertension: Secondary | ICD-10-CM | POA: Diagnosis not present

## 2011-12-24 DIAGNOSIS — R7309 Other abnormal glucose: Secondary | ICD-10-CM

## 2011-12-24 DIAGNOSIS — E7800 Pure hypercholesterolemia, unspecified: Secondary | ICD-10-CM

## 2011-12-24 MED ORDER — HYDROCHLOROTHIAZIDE 25 MG PO TABS: 25.0000 mg | ORAL_TABLET | Freq: Every day | ORAL | Status: DC

## 2011-12-24 NOTE — Assessment & Plan Note (Signed)
Disc goals for lipids and reasons to control them Rev labs with pt Rev low sat fat diet in detail  Could be better - will watch diet more carefully I would like LDL under 100 in light of smoking

## 2011-12-24 NOTE — Assessment & Plan Note (Signed)
Lab Results  Component Value Date   HGBA1C 6.3 12/11/2011   up a bit  Disc need for wt loss and low glycemic diet

## 2011-12-24 NOTE — Patient Instructions (Addendum)
Cholesterol is stable but could be better  Avoid red meat/ fried foods/ egg yolks/ fatty breakfast meats/ butter, cheese and high fat dairy/ and shellfish   Try to incorporate exercise 5 days per week 20-30 minutes indoors or out - to help loose weight Eat a small banana per day for potassium Also get rid of sweets for sugar level and weight  Plan annual exam in 6 months with labs prior  If you are interested in a shingles/zoster vaccine - call your insurance to check on coverage,( you should not get it within 1 month of other vaccines) , then call us for a prescription  for it to take to a pharmacy that gives the shot or get it here

## 2011-12-24 NOTE — Assessment & Plan Note (Signed)
bp in fair control at this time  No changes needed  Disc lifstyle change with low sodium diet and exercise   

## 2011-12-24 NOTE — Assessment & Plan Note (Signed)
Disc in detail risks of smoking and possible outcomes including copd, vascular/ heart disease, cancer , respiratory and sinus infections  Pt voices understanding  She is not ready to quit yet She does have some smokers cough She skipped flu shot this year-- expl this is very important- not to miss it in the future  Also needs shingles vaccine if covered- she will check this out

## 2011-12-24 NOTE — Assessment & Plan Note (Signed)
Discussed how this problem influences overall health and the risks it imposes  Reviewed plan for weight loss with lower calorie diet (via better food choices and also portion control or program like weight watchers) and exercise building up to or more than 30 minutes 5 days per week including some aerobic activity    

## 2011-12-24 NOTE — Progress Notes (Signed)
Subjective:    Patient ID: Regina Harris, female    DOB: [birth year], 76 y.o.   MRN: [id number]  HPI Here for chronic medical problems  Feels fine    Lab Results  Component Value Date   CHOL 189 12/11/2011   CHOL 179 02/19/2011   CHOL 178 08/14/2010   Lab Results  Component Value Date   HDL 45.80 12/11/2011   HDL 47.00 02/19/2011   HDL 38.50* 08/14/2010   Lab Results  Component Value Date   LDLCALC 116* 12/11/2011   LDLCALC 115* 02/19/2011   LDLCALC 118* 08/14/2010   Lab Results  Component Value Date   TRIG 137.0 12/11/2011   TRIG 84.0 02/19/2011   TRIG 109.0 08/14/2010   Lab Results  Component Value Date   CHOLHDL 4 12/11/2011   CHOLHDL 4 02/19/2011   CHOLHDL 5 08/14/2010   No results found for this basename: LDLDIRECT   overall stable  No meds Diet-does eat some fried foods and red meat - not much   Wt is up 2 lb with bmi of 31- ? From the holidays  Diet-- is eating less that she used to - more fruit and vegetables  Exercise- walks a little , not much    Still smokes No desire to quit - not yet , did cut down a bit , though (she did quit for about 3 days) Breathing -- no change / still coughs chronically  bp is  116/72   Today No cp or palpitations or headaches or edema  No side effects to medicines     Chemistry      Component Value Date/Time   NA 141 12/11/2011 0820   K 3.4* 12/11/2011 0820   CL 105 12/11/2011 0820   CO2 28 12/11/2011 0820   BUN 18 12/11/2011 0820   CREATININE 0.8 12/11/2011 0820      Component Value Date/Time   CALCIUM 9.7 12/11/2011 0820   ALKPHOS 56 12/11/2011 0820   AST 18 12/11/2011 0820   ALT 14 12/11/2011 0820   BILITOT 1.0 12/11/2011 0820     K is slt low - is on hctz  Is on mvi  Needs high K food   Hyperglycemia fairly stable  a1c 6.3 Does not eat as many sweets / sugars   Patient Active Problem List  Diagnoses  . NEOPLASM UNSPECIFIED NATURE DIGESTIVE SYSTEM  . HYPERCHOLESTEROLEMIA, PURE  . TOBACCO ABUSE  . HYPERTENSION  . LUNG NODULE  .  CAROTID BRUIT, RIGHT  . COLONIC POLYPS, HX OF  . Hyperglycemia  . Bronchitis  . Obesity   Past Medical History  Diagnosis Date  . History of colonic polyps   . Diabetes mellitus   . Hypertension   . Tobacco abuse   . Carotid bruit   . Hyperlipidemia   . GERD (gastroesophageal reflux disease)   . Esophagitis   . HH (hiatus hernia)   . Pneumonia    Past Surgical History  Procedure Date  . Appendectomy   . Cholecystectomy   . Abdominal hysterectomy   . Cataract extraction 3/11    Dr Charise Killian   History  Substance Use Topics  . Smoking status: Current Everyday Smoker -- 0.3 packs/day    Last Attempt to Quit: 03/21/1911  . Smokeless tobacco: Not on file  . Alcohol Use: Not on file   Family History  Problem Relation Age of Onset  . Kidney cancer Father   . Diabetes Mother    No Known Allergies Current Outpatient  Prescriptions on File Prior to Visit  Medication Sig Dispense Refill  . albuterol (PROVENTIL HFA;VENTOLIN HFA) 108 (90 BASE) MCG/ACT inhaler Inhale 2 puffs into the lungs every 6 (six) hours as needed for wheezing.  1 Inhaler  0  . aspirin 325 MG tablet Take 325 mg by mouth daily.        Marland Kitchen CALCIUM-VITAMIN D PO Take 1 tablet by mouth daily.        . Multiple Vitamin (MULTIVITAMIN) tablet Take 1 tablet by mouth daily.        . Omega-3 Fatty Acids (FISH OIL) 1000 MG CAPS Take 1 capsule by mouth as directed.        . pantoprazole (PROTONIX) 40 MG tablet Take 1 tablet (40 mg total) by mouth daily.  90 tablet  2     Review of Systems Review of Systems  Constitutional: Negative for fever, appetite change, fatigue and unexpected weight change.  Eyes: Negative for pain and visual disturbance.  Respiratory: Negative for shortness of breath.  pos for occ smokers cough Cardiovascular: Negative for cp or palpitations    Gastrointestinal: Negative for nausea, diarrhea and constipation.  Genitourinary: Negative for urgency and frequency.  Skin: Negative for pallor or rash     Neurological: Negative for weakness, light-headedness, numbness and headaches.  Hematological: Negative for adenopathy. Does not bruise/bleed easily.  Psychiatric/Behavioral: Negative for dysphoric mood. The patient is not nervous/anxious.          Objective:   Physical Exam  Constitutional: She appears well-developed and well-nourished. No distress.  HENT:  Head: Normocephalic and atraumatic.  Mouth/Throat: Oropharynx is clear and moist.  Eyes: Conjunctivae and EOM are normal. Pupils are equal, round, and reactive to light. No scleral icterus.  Neck: Normal range of motion. Neck supple. No JVD present. Carotid bruit is present. No thyromegaly present.       Baseline R carotid bruit  Cardiovascular: Normal rate, regular rhythm, normal heart sounds and intact distal pulses.  Exam reveals no gallop.   Pulmonary/Chest: Effort normal and breath sounds normal. No respiratory distress. She has no wheezes.       Diffusely distant bs   Abdominal: Soft. Bowel sounds are normal. She exhibits no distension, no abdominal bruit and no mass. There is no tenderness.  Musculoskeletal: She exhibits no edema and no tenderness.  Lymphadenopathy:    She has no cervical adenopathy.  Neurological: She is alert. She has normal reflexes. No cranial nerve deficit. She exhibits normal muscle tone. Coordination normal.  Skin: Skin is warm and dry. No rash noted. No erythema. No pallor.  Psychiatric: She has a normal mood and affect.          Assessment & Plan:

## 2012-04-15 ENCOUNTER — Ambulatory Visit: Payer: Self-pay | Admitting: General Surgery

## 2012-04-15 DIAGNOSIS — N63 Unspecified lump in unspecified breast: Secondary | ICD-10-CM | POA: Diagnosis not present

## 2012-04-15 DIAGNOSIS — R920 Mammographic microcalcification found on diagnostic imaging of breast: Secondary | ICD-10-CM | POA: Diagnosis not present

## 2012-06-16 ENCOUNTER — Telehealth: Payer: Self-pay | Admitting: Family Medicine

## 2012-06-16 DIAGNOSIS — E7800 Pure hypercholesterolemia, unspecified: Secondary | ICD-10-CM

## 2012-06-16 DIAGNOSIS — I10 Essential (primary) hypertension: Secondary | ICD-10-CM

## 2012-06-16 DIAGNOSIS — R739 Hyperglycemia, unspecified: Secondary | ICD-10-CM

## 2012-06-16 NOTE — Telephone Encounter (Signed)
Message copied by Abner Greenspan on Mon Jun 16, 2012 10:27 PM ------      Message from: Ellamae Sia      Created: Wed Jun 11, 2012 12:44 PM      Regarding: Lab orders for Tuesday, 8.13.13       Patient is scheduled for CPX labs, please order future labs, Thanks , Karna Christmas

## 2012-06-17 ENCOUNTER — Other Ambulatory Visit (INDEPENDENT_AMBULATORY_CARE_PROVIDER_SITE_OTHER): Payer: Medicare Other

## 2012-06-17 DIAGNOSIS — E7800 Pure hypercholesterolemia, unspecified: Secondary | ICD-10-CM | POA: Diagnosis not present

## 2012-06-17 DIAGNOSIS — I10 Essential (primary) hypertension: Secondary | ICD-10-CM | POA: Diagnosis not present

## 2012-06-17 DIAGNOSIS — R739 Hyperglycemia, unspecified: Secondary | ICD-10-CM

## 2012-06-17 DIAGNOSIS — R7309 Other abnormal glucose: Secondary | ICD-10-CM

## 2012-06-17 LAB — LIPID PANEL
Cholesterol: 171 mg/dL (ref 0–200)
LDL Cholesterol: 100 mg/dL — ABNORMAL HIGH (ref 0–99)
Total CHOL/HDL Ratio: 4
VLDL: 22.2 mg/dL (ref 0.0–40.0)

## 2012-06-17 LAB — COMPREHENSIVE METABOLIC PANEL
ALT: 15 U/L (ref 0–35)
AST: 17 U/L (ref 0–37)
Albumin: 3.7 g/dL (ref 3.5–5.2)
Alkaline Phosphatase: 52 U/L (ref 39–117)
BUN: 20 mg/dL (ref 6–23)
Potassium: 3.7 mEq/L (ref 3.5–5.1)
Sodium: 138 mEq/L (ref 135–145)

## 2012-06-17 LAB — CBC WITH DIFFERENTIAL/PLATELET
Basophils Absolute: 0 10*3/uL (ref 0.0–0.1)
Basophils Relative: 0.4 % (ref 0.0–3.0)
Eosinophils Absolute: 0.3 10*3/uL (ref 0.0–0.7)
Lymphocytes Relative: 30.9 % (ref 12.0–46.0)
MCHC: 33.2 g/dL (ref 30.0–36.0)
MCV: 98 fl (ref 78.0–100.0)
Monocytes Absolute: 0.6 10*3/uL (ref 0.1–1.0)
Neutrophils Relative %: 57 % (ref 43.0–77.0)
Platelets: 164 10*3/uL (ref 150.0–400.0)
RDW: 13.1 % (ref 11.5–14.6)

## 2012-06-24 ENCOUNTER — Ambulatory Visit (INDEPENDENT_AMBULATORY_CARE_PROVIDER_SITE_OTHER): Payer: Medicare Other | Admitting: Family Medicine

## 2012-06-24 ENCOUNTER — Encounter: Payer: Self-pay | Admitting: Family Medicine

## 2012-06-24 VITALS — BP 138/80 | HR 87 | Temp 97.7°F | Ht 67.0 in | Wt 190.8 lb

## 2012-06-24 DIAGNOSIS — E669 Obesity, unspecified: Secondary | ICD-10-CM

## 2012-06-24 DIAGNOSIS — Z780 Asymptomatic menopausal state: Secondary | ICD-10-CM

## 2012-06-24 DIAGNOSIS — Z86010 Personal history of colonic polyps: Secondary | ICD-10-CM

## 2012-06-24 DIAGNOSIS — R7309 Other abnormal glucose: Secondary | ICD-10-CM

## 2012-06-24 DIAGNOSIS — F17200 Nicotine dependence, unspecified, uncomplicated: Secondary | ICD-10-CM

## 2012-06-24 DIAGNOSIS — E7800 Pure hypercholesterolemia, unspecified: Secondary | ICD-10-CM

## 2012-06-24 DIAGNOSIS — Z23 Encounter for immunization: Secondary | ICD-10-CM | POA: Diagnosis not present

## 2012-06-24 DIAGNOSIS — I10 Essential (primary) hypertension: Secondary | ICD-10-CM

## 2012-06-24 DIAGNOSIS — R739 Hyperglycemia, unspecified: Secondary | ICD-10-CM

## 2012-06-24 NOTE — Assessment & Plan Note (Signed)
Improved with better diet  Enc to keep up the good work Disc goals for lipids and reasons to control them Rev labs with pt Rev low sat fat diet in detail

## 2012-06-24 NOTE — Assessment & Plan Note (Signed)
Disc in detail risks of smoking and possible outcomes including copd, vascular/ heart disease, cancer , respiratory and sinus infections  Pt voices understanding In addn has high hb Pt states she is not ready to quit

## 2012-06-24 NOTE — Assessment & Plan Note (Signed)
utd colonosc  No bowel changes

## 2012-06-24 NOTE — Assessment & Plan Note (Signed)
bp in fair control at this time  No changes needed  Disc lifstyle change with low sodium diet and exercise  Labs rev Adv to quit smoking

## 2012-06-24 NOTE — Assessment & Plan Note (Signed)
Stable Lab Results  Component Value Date   HGBA1C 6.0 06/17/2012   disc low glycemic diet Enc further wt loss

## 2012-06-24 NOTE — Patient Instructions (Signed)
Tetanus shot today  If you are interested in a shingles/zoster vaccine - call your insurance to check on coverage,( you should not get it within 1 month of other vaccines) , then call us for a prescription  for it to take to a pharmacy that gives the shot   we will refer you for bone density test at check out

## 2012-06-24 NOTE — Assessment & Plan Note (Signed)
Better diet and exercise Wt down 12 lb Enc her to keep going- congratulated

## 2012-06-24 NOTE — Progress Notes (Signed)
Subjective:    Patient ID: Regina Harris, female    DOB: November 17, 1935, 76 y.o.   MRN: [id number]  HPI Here for check up of chronic medical conditions and to review health mt list   Wt is down 12 lb - bmi of29  Is working on it  Is working outdoors and walking more  Is eating pretty healthy   bp is stable today  No cp or palpitations or headaches or edema  No side effects to medicines  BP Readings from Last 3 Encounters:  06/24/12 138/80  12/24/11 116/72  06/19/11 140/70     Hb is 17.7 in a smoker-stable Lab Results  Component Value Date   WBC 7.4 06/17/2012   HGB 17.7* 06/17/2012   HCT 53.4* 06/17/2012   MCV 98.0 06/17/2012   PLT 164.0 06/17/2012     Smoking status-- smoking 1-11/2 pk per day  Not quite ready to quit yet - thinking about it   dexa 05 Has not had one  Never broken a bone  Takes ca and vit D   mammo 6/13-- turned out fine  Self exam- tends to be lumpy Saw Dr Fleet Contras for her breast exam in June - was reassured  She does not want a breast exam today  She has a carotid bruit Still declines any further w/u of that Takes an asa daily  Zoster status Never had dz or vaccine  ? If interested in vaccine   Td due-will do today  F/u shot- last fall   colonosc 3/10 - 5-10 year recal? Hx of polyps No stool changes  Lipids Lab Results  Component Value Date   CHOL 171 06/17/2012   CHOL 189 12/11/2011   CHOL 179 02/19/2011   Lab Results  Component Value Date   HDL 48.80 06/17/2012   HDL 45.80 12/11/2011   HDL 47.00 02/19/2011   Lab Results  Component Value Date   LDLCALC 100* 06/17/2012   LDLCALC 116* 12/11/2011   LDLCALC 115* 02/19/2011   Lab Results  Component Value Date   TRIG 111.0 06/17/2012   TRIG 137.0 12/11/2011   TRIG 84.0 02/19/2011   Lab Results  Component Value Date   CHOLHDL 4 06/17/2012   CHOLHDL 4 12/11/2011   CHOLHDL 4 02/19/2011   No results found for this basename: LDLDIRECT   is eating better   Patient Active Problem List  Diagnosis    . NEOPLASM UNSPECIFIED NATURE DIGESTIVE SYSTEM  . HYPERCHOLESTEROLEMIA, PURE  . TOBACCO ABUSE  . HYPERTENSION  . LUNG NODULE  . CAROTID BRUIT, RIGHT  . COLONIC POLYPS, HX OF  . Hyperglycemia  . Bronchitis  . Obesity   Past Medical History  Diagnosis Date  . History of colonic polyps   . Diabetes mellitus   . Hypertension   . Tobacco abuse   . Carotid bruit   . Hyperlipidemia   . GERD (gastroesophageal reflux disease)   . Esophagitis   . HH (hiatus hernia)   . Pneumonia    Past Surgical History  Procedure Date  . Appendectomy   . Cholecystectomy   . Abdominal hysterectomy   . Cataract extraction 3/11    Dr Charise Killian   History  Substance Use Topics  . Smoking status: Current Everyday Smoker -- 0.3 packs/day    Last Attempt to Quit: 03/21/1911  . Smokeless tobacco: Not on file  . Alcohol Use: No   Family History  Problem Relation Age of Onset  . Kidney cancer Father   .  Diabetes Mother    No Known Allergies Current Outpatient Prescriptions on File Prior to Visit  Medication Sig Dispense Refill  . aspirin 325 MG tablet Take 325 mg by mouth daily.        Marland Kitchen CALCIUM-VITAMIN D PO Take 1 tablet by mouth daily.        . hydrochlorothiazide (HYDRODIURIL) 25 MG tablet Take 1 tablet (25 mg total) by mouth daily.  90 tablet  3  . Multiple Vitamin (MULTIVITAMIN) tablet Take 1 tablet by mouth daily.        . Omega-3 Fatty Acids (FISH OIL) 1000 MG CAPS Take 1 capsule by mouth as directed.        Marland Kitchen albuterol (PROVENTIL HFA;VENTOLIN HFA) 108 (90 BASE) MCG/ACT inhaler Inhale 2 puffs into the lungs every 6 (six) hours as needed for wheezing.  1 Inhaler  0  . pantoprazole (PROTONIX) 40 MG tablet Take 1 tablet (40 mg total) by mouth daily.  90 tablet  2      Review of Systems    Review of Systems  Constitutional: Negative for fever, appetite change, fatigue and unexpected weight change.  Eyes: Negative for pain and visual disturbance.  Respiratory: Negative for cough and shortness  of breath.   Cardiovascular: Negative for cp or palpitations    Gastrointestinal: Negative for nausea, diarrhea and constipation.  Genitourinary: Negative for urgency and frequency.  Skin: Negative for pallor or rash   Neurological: Negative for weakness, light-headedness, numbness and headaches.  Hematological: Negative for adenopathy. Does not bruise/bleed easily.  Psychiatric/Behavioral: Negative for dysphoric mood. The patient is not nervous/anxious.      Objective:   Physical Exam  Constitutional: She appears well-developed and well-nourished. No distress.       obese and well appearing   HENT:  Head: Normocephalic and atraumatic.  Right Ear: External ear normal.  Left Ear: External ear normal.  Nose: Nose normal.  Mouth/Throat: Oropharynx is clear and moist.  Eyes: Conjunctivae and EOM are normal. Pupils are equal, round, and reactive to light. No scleral icterus.  Neck: Normal range of motion. Neck supple. No JVD present. Carotid bruit is present. No thyromegaly present.  Cardiovascular: Normal rate, regular rhythm, normal heart sounds and intact distal pulses.  Exam reveals no gallop.   Pulmonary/Chest: Effort normal and breath sounds normal. No respiratory distress. She has no wheezes.       Diffusely distant bs   Abdominal: Soft. Bowel sounds are normal. She exhibits no distension, no abdominal bruit and no mass. There is no tenderness.  Musculoskeletal: Normal range of motion. She exhibits no edema and no tenderness.  Lymphadenopathy:    She has no cervical adenopathy.  Neurological: She is alert. She has normal reflexes. No cranial nerve deficit. She exhibits normal muscle tone. Coordination normal.  Skin: Skin is warm and dry. No rash noted. No erythema. No pallor.       Very tanned lentigos and solar aging Some SKs  Psychiatric: She has a normal mood and affect.          Assessment & Plan:

## 2012-06-24 NOTE — Assessment & Plan Note (Signed)
Smoker also At risk for OP sched dexa

## 2012-07-03 ENCOUNTER — Ambulatory Visit: Payer: Self-pay | Admitting: Family Medicine

## 2012-07-03 DIAGNOSIS — N959 Unspecified menopausal and perimenopausal disorder: Secondary | ICD-10-CM | POA: Diagnosis not present

## 2012-07-03 DIAGNOSIS — M810 Age-related osteoporosis without current pathological fracture: Secondary | ICD-10-CM | POA: Diagnosis not present

## 2012-07-09 ENCOUNTER — Encounter: Payer: Self-pay | Admitting: Family Medicine

## 2012-07-14 ENCOUNTER — Encounter: Payer: Self-pay | Admitting: Family Medicine

## 2012-09-10 DIAGNOSIS — Z961 Presence of intraocular lens: Secondary | ICD-10-CM | POA: Diagnosis not present

## 2012-10-07 ENCOUNTER — Ambulatory Visit: Payer: Self-pay | Admitting: Unknown Physician Specialty

## 2012-10-07 DIAGNOSIS — K2970 Gastritis, unspecified, without bleeding: Secondary | ICD-10-CM | POA: Diagnosis not present

## 2012-10-07 DIAGNOSIS — I10 Essential (primary) hypertension: Secondary | ICD-10-CM | POA: Diagnosis not present

## 2012-10-07 DIAGNOSIS — Z09 Encounter for follow-up examination after completed treatment for conditions other than malignant neoplasm: Secondary | ICD-10-CM | POA: Diagnosis not present

## 2012-10-07 DIAGNOSIS — K2990 Gastroduodenitis, unspecified, without bleeding: Secondary | ICD-10-CM | POA: Diagnosis not present

## 2012-10-07 DIAGNOSIS — K2270 Barrett's esophagus without dysplasia: Secondary | ICD-10-CM | POA: Diagnosis not present

## 2012-10-07 DIAGNOSIS — Z7982 Long term (current) use of aspirin: Secondary | ICD-10-CM | POA: Diagnosis not present

## 2012-10-07 DIAGNOSIS — Z79899 Other long term (current) drug therapy: Secondary | ICD-10-CM | POA: Diagnosis not present

## 2012-10-07 DIAGNOSIS — Z90710 Acquired absence of both cervix and uterus: Secondary | ICD-10-CM | POA: Diagnosis not present

## 2012-10-08 LAB — PATHOLOGY REPORT

## 2012-10-09 DIAGNOSIS — N63 Unspecified lump in unspecified breast: Secondary | ICD-10-CM | POA: Diagnosis not present

## 2012-10-09 DIAGNOSIS — N641 Fat necrosis of breast: Secondary | ICD-10-CM | POA: Diagnosis not present

## 2012-10-09 HISTORY — PX: BREAST BIOPSY: SHX20

## 2012-10-27 ENCOUNTER — Encounter: Payer: Self-pay | Admitting: Family Medicine

## 2013-01-03 ENCOUNTER — Other Ambulatory Visit: Payer: Self-pay | Admitting: Family Medicine

## 2013-03-09 ENCOUNTER — Encounter: Payer: Self-pay | Admitting: *Deleted

## 2013-04-16 ENCOUNTER — Ambulatory Visit: Payer: Self-pay | Admitting: General Surgery

## 2013-04-16 DIAGNOSIS — N63 Unspecified lump in unspecified breast: Secondary | ICD-10-CM | POA: Diagnosis not present

## 2013-04-16 DIAGNOSIS — R928 Other abnormal and inconclusive findings on diagnostic imaging of breast: Secondary | ICD-10-CM | POA: Diagnosis not present

## 2013-04-17 ENCOUNTER — Encounter: Payer: Self-pay | Admitting: General Surgery

## 2013-05-06 ENCOUNTER — Encounter: Payer: Self-pay | Admitting: General Surgery

## 2013-05-06 ENCOUNTER — Ambulatory Visit (INDEPENDENT_AMBULATORY_CARE_PROVIDER_SITE_OTHER): Payer: Medicare Other | Admitting: General Surgery

## 2013-05-06 VITALS — BP 132/60 | HR 68 | Resp 14 | Ht 68.0 in | Wt 198.0 lb

## 2013-05-06 DIAGNOSIS — N63 Unspecified lump in unspecified breast: Secondary | ICD-10-CM | POA: Diagnosis not present

## 2013-05-06 NOTE — Progress Notes (Signed)
Patient ID: Regina Harris, female   DOB: Feb 11, 1936, 77 y.o.   MRN: [id number]  No chief complaint on file.   HPI Regina Harris is a 77 y.o. female who presents for a breast evaluation. The most recent mammogram was done on 04/17/13 with a birad category 2. Patient does perform regular self breast checks and gets regular mammograms done. No family history of breast cancer. The patient states she noticed a left breast lump after having her mammogram done.    HPI  Past Medical History  Diagnosis Date  . History of colonic polyps   . Diabetes mellitus   . Hypertension   . Tobacco abuse   . Carotid bruit   . Hyperlipidemia   . GERD (gastroesophageal reflux disease)   . Esophagitis   . HH (hiatus hernia)   . Pneumonia   . Gastritis 2013  . Lump or mass in breast     Past Surgical History  Procedure Laterality Date  . Appendectomy    . Cholecystectomy    . Abdominal hysterectomy    . Cataract extraction  3/11    Dr Charise Killian  . Breast biopsy Right 1996  . Upper gi endoscopy  2013  . Colonoscopy  2009    Family History  Problem Relation Age of Onset  . Kidney cancer Father   . Diabetes Mother     Social History History  Substance Use Topics  . Smoking status: Current Every Day Smoker -- 1.50 packs/day    Last Attempt to Quit: 03/21/1911  . Smokeless tobacco: Never Used  . Alcohol Use: No    No Known Allergies  Current Outpatient Prescriptions  Medication Sig Dispense Refill  . aspirin 325 MG tablet Take 325 mg by mouth daily.        Marland Kitchen CALCIUM-VITAMIN D PO Take 1 tablet by mouth daily.        Marland Kitchen docusate sodium (COLACE) 100 MG capsule Take 100 mg by mouth 2 (two) times daily.      . hydrochlorothiazide (HYDRODIURIL) 25 MG tablet TAKE 1 TABLET (25 MG TOTAL) BY MOUTH DAILY.  90 tablet  1  . Multiple Vitamin (MULTIVITAMIN) tablet Take 1 tablet by mouth daily.        . Omega-3 Fatty Acids (FISH OIL) 1000 MG CAPS Take 1 capsule by mouth as directed.        Marland Kitchen albuterol  (PROVENTIL HFA;VENTOLIN HFA) 108 (90 BASE) MCG/ACT inhaler Inhale 2 puffs into the lungs every 6 (six) hours as needed for wheezing.  1 Inhaler  0   No current facility-administered medications for this visit.    Review of Systems Review of Systems  Blood pressure 132/60, pulse 68, resp. rate 14, height 5\' 8"  (1.727 m), weight 198 lb (89.812 kg).  Physical Exam Physical Exam  Constitutional: She appears well-developed and well-nourished.  Cardiovascular: Normal rate, regular rhythm and normal heart sounds.   No murmur heard. Pulmonary/Chest: Effort normal and breath sounds normal. Right breast exhibits no inverted nipple, no mass, no nipple discharge, no skin change and no tenderness. Left breast exhibits no inverted nipple, no mass, no nipple discharge, no skin change and no tenderness.  Left breast larger than right which is unchanged.   Lymphadenopathy:    She has no cervical adenopathy.    She has no axillary adenopathy.  Neurological: She is alert.  Skin: Skin is warm and dry.    Data Reviewed Bilateral mammograms dated 04/17/2013 were reviewed. Stable diffuse microcalcifications. No new  areas of concern. BI-RAD-2.  Left breast [facility] biopsy completed 10/09/2012 showed benign breast tissue with focal fat necrosis and focal periductal chronic inflammation.  Assessment    Benign breast exam.    Plan    The patient should continue annual screening mammograms and clinical breast exam both her primary care provider.       Robert Bellow 05/06/2013, 11:04 AM

## 2013-05-06 NOTE — Patient Instructions (Addendum)
Patient to return as needed. 

## 2013-07-01 ENCOUNTER — Other Ambulatory Visit: Payer: Self-pay | Admitting: Family Medicine

## 2013-07-01 NOTE — Telephone Encounter (Signed)
appt scheduled for 08/24/13 and med refilled until then

## 2013-07-01 NOTE — Telephone Encounter (Signed)
Electronic refill request, no recent/future appt., please advise  

## 2013-07-01 NOTE — Telephone Encounter (Signed)
Please schedule f/u and refill until then, thanks 

## 2013-08-24 ENCOUNTER — Encounter: Payer: Self-pay | Admitting: Family Medicine

## 2013-08-24 ENCOUNTER — Ambulatory Visit (INDEPENDENT_AMBULATORY_CARE_PROVIDER_SITE_OTHER): Payer: Medicare Other | Admitting: Family Medicine

## 2013-08-24 VITALS — BP 130/68 | HR 76 | Temp 97.7°F | Ht 68.0 in | Wt 200.5 lb

## 2013-08-24 DIAGNOSIS — R7309 Other abnormal glucose: Secondary | ICD-10-CM

## 2013-08-24 DIAGNOSIS — I10 Essential (primary) hypertension: Secondary | ICD-10-CM

## 2013-08-24 DIAGNOSIS — E7800 Pure hypercholesterolemia, unspecified: Secondary | ICD-10-CM | POA: Diagnosis not present

## 2013-08-24 DIAGNOSIS — Z23 Encounter for immunization: Secondary | ICD-10-CM | POA: Diagnosis not present

## 2013-08-24 DIAGNOSIS — K219 Gastro-esophageal reflux disease without esophagitis: Secondary | ICD-10-CM | POA: Insufficient documentation

## 2013-08-24 DIAGNOSIS — F17200 Nicotine dependence, unspecified, uncomplicated: Secondary | ICD-10-CM

## 2013-08-24 DIAGNOSIS — R739 Hyperglycemia, unspecified: Secondary | ICD-10-CM

## 2013-08-24 LAB — LIPID PANEL
HDL: 46.2 mg/dL (ref >39.00)
LDL Cholesterol: 111 mg/dL — ABNORMAL HIGH (ref 0–99)
Total CHOL/HDL Ratio: 4
Triglycerides: 103 mg/dL (ref 0.0–149.0)
VLDL: 20.6 mg/dL (ref 0.0–40.0)

## 2013-08-24 LAB — COMPREHENSIVE METABOLIC PANEL
Alkaline Phosphatase: 55 U/L (ref 39–117)
BUN: 16 mg/dL (ref 6–23)
Glucose, Bld: 112 mg/dL — ABNORMAL HIGH (ref 70–99)
Total Bilirubin: 0.5 mg/dL (ref 0.3–1.2)

## 2013-08-24 LAB — CBC WITH DIFFERENTIAL/PLATELET
Basophils Relative: 0.4 % (ref 0.0–3.0)
Eosinophils Relative: 1.5 % (ref 0.0–5.0)
HCT: 52.2 % — ABNORMAL HIGH (ref 36.0–46.0)
Lymphs Abs: 2 10*3/uL (ref 0.7–4.0)
MCV: 95.9 fl (ref 78.0–100.0)
Monocytes Absolute: 0.8 10*3/uL (ref 0.1–1.0)
RBC: 5.44 Mil/uL — ABNORMAL HIGH (ref 3.87–5.11)
WBC: 9.8 10*3/uL (ref 4.5–10.5)

## 2013-08-24 LAB — HEMOGLOBIN A1C: Hgb A1c MFr Bld: 6.4 % (ref 4.6–6.5)

## 2013-08-24 MED ORDER — OMEPRAZOLE 20 MG PO CPDR: 20.0000 mg | DELAYED_RELEASE_CAPSULE | Freq: Two times a day (BID) | ORAL | Status: DC

## 2013-08-24 MED ORDER — HYDROCHLOROTHIAZIDE 25 MG PO TABS: 25.0000 mg | ORAL_TABLET | Freq: Every day | ORAL | Status: DC

## 2013-08-24 NOTE — Assessment & Plan Note (Signed)
Doing ok on prilosec 20 mg Disc anti reflux measures

## 2013-08-24 NOTE — Progress Notes (Signed)
Subjective:    Patient ID: [name], female    DOB: 11-Dec-1935, 77 y.o.   MRN: [id number]  HPI Here for f/u of chronic medical problems   Head is stopped up today- was outdoors a lot this weekend  ? Allergies   Still smoking - and not ready to quit at this time   Wt is up 2lb with bmi of 30  Zoster status- called insurance and they will not pay, she cannot afford the vaccine   Mammogram- had that in June   Flu vaccine- got that today   bp is stable today  No cp or palpitations or headaches or edema  No side effects to medicines  BP Readings from Last 3 Encounters:  08/24/13 130/68  05/06/13 132/60  06/24/12 138/80     Hx of carotid bruit Does not want to work that up Understands her inc risk for cva  On asa 325  Hyperlipidemia Diet controlled  Lab Results  Component Value Date   CHOL 171 06/17/2012   HDL 48.80 06/17/2012   LDLCALC 100* 06/17/2012   TRIG 111.0 06/17/2012   CHOLHDL 4 06/17/2012   Due for a check  Diet is so/so -- she makes the effort to avoid fried food/ grease  She does eat a lot of eggs , not a lot of cheese   Hyperglycemia  Wt is up 2 lb with bmi of 30 Lab Results  Component Value Date   HGBA1C 6.0 06/17/2012   avoids sweets for the most part She watches her bread/ pasta   Some exercise- daily activity  Some walking - but not enough   Patient Active Problem List   Diagnosis Date Noted  . Lump or mass in breast 05/06/2013  . Post-menopausal 06/24/2012  . Obesity 12/24/2011  . Bronchitis 06/19/2011  . Hyperglycemia 03/12/2011  . NEOPLASM UNSPECIFIED NATURE DIGESTIVE SYSTEM 07/24/2010  . LUNG NODULE 01/16/2010  . HYPERCHOLESTEROLEMIA, PURE 07/10/2007  . TOBACCO ABUSE 03/19/2007  . HYPERTENSION 03/19/2007  . CAROTID BRUIT, RIGHT 03/19/2007  . COLONIC POLYPS, HX OF 03/19/2007   Past Medical History  Diagnosis Date  . History of colonic polyps   . Diabetes mellitus   . Hypertension   . Tobacco abuse   . Carotid bruit   .  Hyperlipidemia   . GERD (gastroesophageal reflux disease)   . Esophagitis   . HH (hiatus hernia)   . Pneumonia   . Gastritis 2013  . Lump or mass in breast    Past Surgical History  Procedure Laterality Date  . Appendectomy    . Cholecystectomy    . Abdominal hysterectomy    . Cataract extraction  3/11    Dr Charise Killian  . Upper gi endoscopy  2013  . Colonoscopy  2009  . Breast biopsy Right 1996  . Breast biopsy Left 10/09/2012    Benign breast tissue with focal fat necrosis and focal periductal chronic inflammation.  . Breast biopsy Left 10/09/2012    Benign breast tissue with focal fat necrosis and focal periductal chronic inflammation.   History  Substance Use Topics  . Smoking status: Current Every Day Smoker -- 1.50 packs/day  . Smokeless tobacco: Never Used  . Alcohol Use: No   Family History  Problem Relation Age of Onset  . Kidney cancer Father   . Diabetes Mother    No Known Allergies Current Outpatient Prescriptions on File Prior to Visit  Medication Sig Dispense Refill  . albuterol (PROVENTIL HFA;VENTOLIN HFA) 108 (90 BASE)  MCG/ACT inhaler Inhale 2 puffs into the lungs every 6 (six) hours as needed for wheezing.  1 Inhaler  0  . aspirin 325 MG tablet Take 325 mg by mouth daily.        Marland Kitchen CALCIUM-VITAMIN D PO Take 1 tablet by mouth daily.        Marland Kitchen docusate sodium (COLACE) 100 MG capsule Take 100 mg by mouth 2 (two) times daily.      . hydrochlorothiazide (HYDRODIURIL) 25 MG tablet TAKE 1 TABLET BY MOUTH EVERY DAY  90 tablet  0  . Multiple Vitamin (MULTIVITAMIN) tablet Take 1 tablet by mouth daily.        . Omega-3 Fatty Acids (FISH OIL) 1000 MG CAPS Take 1 capsule by mouth as directed.         No current facility-administered medications on file prior to visit.      Review of Systems Review of Systems  Constitutional: Negative for fever, appetite change, fatigue and unexpected weight change.  Eyes: Negative for pain and visual disturbance.  Respiratory: Negative  for cough and shortness of breath. Neg for wheezing   Cardiovascular: Negative for cp or palpitations    Gastrointestinal: Negative for nausea, diarrhea and constipation.  Genitourinary: Negative for urgency and frequency.  Skin: Negative for pallor or rash   Neurological: Negative for weakness, light-headedness, numbness and headaches.  Hematological: Negative for adenopathy. Does not bruise/bleed easily.  Psychiatric/Behavioral: Negative for dysphoric mood. The patient is not nervous/anxious.         Objective:   Physical Exam  Constitutional: She appears well-developed and well-nourished. No distress.  obese and well appearing   HENT:  Head: Normocephalic and atraumatic.  Mouth/Throat: Oropharynx is clear and moist.  Eyes: Conjunctivae and EOM are normal. Pupils are equal, round, and reactive to light. No scleral icterus.  Neck: Normal range of motion. Neck supple. No JVD present. Carotid bruit is present. No thyromegaly present.  Cardiovascular: Normal rate, regular rhythm, normal heart sounds and intact distal pulses.  Exam reveals no gallop.   Pulmonary/Chest: Effort normal and breath sounds normal. No respiratory distress. She has no wheezes. She exhibits no tenderness.  Diffusely distant bs   Abdominal: Soft. Bowel sounds are normal. She exhibits no distension, no abdominal bruit and no mass. There is no tenderness.  Genitourinary: No breast swelling, tenderness, discharge or bleeding.  Musculoskeletal: Normal range of motion. She exhibits no edema.  Lymphadenopathy:    She has no cervical adenopathy.  Neurological: She is alert. She has normal reflexes. No cranial nerve deficit. She exhibits normal muscle tone. Coordination normal.  Skin: Skin is warm and dry. No rash noted. No erythema. No pallor.  Psychiatric: She has a normal mood and affect.          Assessment & Plan:

## 2013-08-24 NOTE — Assessment & Plan Note (Signed)
bp in fair control at this time  No changes needed  Disc lifstyle change with low sodium diet and exercise  BP: 130/68 mmHg   Lab today

## 2013-08-24 NOTE — Assessment & Plan Note (Signed)
Disc in detail risks of smoking and possible outcomes including copd, vascular/ heart disease, cancer , respiratory and sinus infections  Pt voices understanding Pt has no desire to quit at this time She is aware of high stroke risk as well

## 2013-08-24 NOTE — Patient Instructions (Addendum)
Keep thinking about quitting smoking  Flu shot today  Labs today  Take care of yourself - and think about some extra exercise like walking every day  Try to avoid fats and sugar in diet  Follow up in 6 months for annual exam with labs prior

## 2013-08-24 NOTE — Assessment & Plan Note (Signed)
Lipid panel today Diet is fair Disc goals for lipids and reasons to control them Rev low sat fat diet in detail

## 2013-08-24 NOTE — Assessment & Plan Note (Signed)
A1c today Diet is fair-could be better  Would benefit from wt loss  Disc low glycemic diet and inc physical activity

## 2013-08-26 ENCOUNTER — Encounter: Payer: Self-pay | Admitting: *Deleted

## 2013-08-31 ENCOUNTER — Telehealth: Payer: Self-pay | Admitting: Family Medicine

## 2013-08-31 DIAGNOSIS — I10 Essential (primary) hypertension: Secondary | ICD-10-CM

## 2013-08-31 DIAGNOSIS — R739 Hyperglycemia, unspecified: Secondary | ICD-10-CM

## 2013-08-31 NOTE — Telephone Encounter (Signed)
Message copied by Abner Greenspan on Mon Aug 31, 2013  9:42 PM ------      Message from: Ellamae Sia      Created: Mon Aug 31, 2013  4:54 PM      Regarding: Lab orders for Tuesday, 10.28.14       Lab orders with no f/u appt. Thanks ------

## 2013-08-31 NOTE — Telephone Encounter (Signed)
Message copied by Abner Greenspan on Mon Aug 31, 2013  9:41 PM ------      Message from: Ellamae Sia      Created: Mon Aug 31, 2013  4:54 PM      Regarding: Lab orders for Tuesday, 10.28.14       Lab orders with no f/u appt. Thanks ------

## 2013-09-01 ENCOUNTER — Other Ambulatory Visit (INDEPENDENT_AMBULATORY_CARE_PROVIDER_SITE_OTHER): Payer: Medicare Other

## 2013-09-01 DIAGNOSIS — R739 Hyperglycemia, unspecified: Secondary | ICD-10-CM

## 2013-09-01 DIAGNOSIS — I10 Essential (primary) hypertension: Secondary | ICD-10-CM | POA: Diagnosis not present

## 2013-09-01 DIAGNOSIS — R7309 Other abnormal glucose: Secondary | ICD-10-CM

## 2013-09-01 LAB — COMPREHENSIVE METABOLIC PANEL
AST: 17 U/L (ref 0–37)
BUN: 19 mg/dL (ref 6–23)
CO2: 31 mEq/L (ref 19–32)
Calcium: 9.7 mg/dL (ref 8.4–10.5)
Chloride: 99 mEq/L (ref 96–112)
Creatinine, Ser: 0.9 mg/dL (ref 0.4–1.2)
GFR: 65.62 mL/min (ref >60.00)
Sodium: 139 mEq/L (ref 135–145)
Total Bilirubin: 0.7 mg/dL (ref 0.3–1.2)

## 2013-09-01 LAB — TSH: TSH: 0.53 u[IU]/mL (ref 0.35–5.50)

## 2013-09-02 ENCOUNTER — Telehealth: Payer: Self-pay | Admitting: Family Medicine

## 2013-09-02 MED ORDER — POTASSIUM CHLORIDE ER 10 MEQ PO TBCR: 10.0000 meq | EXTENDED_RELEASE_TABLET | Freq: Every day | ORAL | Status: DC

## 2013-09-02 NOTE — Telephone Encounter (Signed)
A1c is up a bit- work on wt loss and lower sugar diet  K is a bit low Please call in KCL Re check K in 2 wk please for hypokalemia

## 2013-09-04 NOTE — Telephone Encounter (Signed)
Left voicemail requesting pt to call office 

## 2013-09-07 MED ORDER — POTASSIUM CHLORIDE ER 10 MEQ PO TBCR: 10.0000 meq | EXTENDED_RELEASE_TABLET | Freq: Every day | ORAL | Status: DC

## 2013-09-07 NOTE — Telephone Encounter (Signed)
Pt notified of lab results and Dr. Marliss Coots comments, K sent to pharmacy and lab appt scheduled for 09/21/13

## 2013-09-15 ENCOUNTER — Other Ambulatory Visit: Payer: Self-pay | Admitting: Family Medicine

## 2013-09-15 DIAGNOSIS — E876 Hypokalemia: Secondary | ICD-10-CM

## 2013-09-21 ENCOUNTER — Other Ambulatory Visit (INDEPENDENT_AMBULATORY_CARE_PROVIDER_SITE_OTHER): Payer: Medicare Other

## 2013-09-21 DIAGNOSIS — E876 Hypokalemia: Secondary | ICD-10-CM | POA: Diagnosis not present

## 2013-09-21 LAB — POTASSIUM: Potassium: 3.9 mEq/L (ref 3.5–5.1)

## 2014-01-04 ENCOUNTER — Other Ambulatory Visit: Payer: Self-pay | Admitting: *Deleted

## 2014-01-04 MED ORDER — POTASSIUM CHLORIDE ER 10 MEQ PO TBCR: 10.0000 meq | EXTENDED_RELEASE_TABLET | Freq: Every day | ORAL | Status: DC

## 2014-01-26 DIAGNOSIS — Z86010 Personal history of colonic polyps: Secondary | ICD-10-CM | POA: Diagnosis not present

## 2014-02-09 DIAGNOSIS — Z860100 Personal history of colon polyps, unspecified: Secondary | ICD-10-CM | POA: Diagnosis not present

## 2014-02-09 DIAGNOSIS — D126 Benign neoplasm of colon, unspecified: Secondary | ICD-10-CM | POA: Diagnosis not present

## 2014-02-09 DIAGNOSIS — K5730 Diverticulosis of large intestine without perforation or abscess without bleeding: Secondary | ICD-10-CM | POA: Diagnosis not present

## 2014-02-09 DIAGNOSIS — K648 Other hemorrhoids: Secondary | ICD-10-CM | POA: Diagnosis not present

## 2014-02-09 DIAGNOSIS — Z09 Encounter for follow-up examination after completed treatment for conditions other than malignant neoplasm: Secondary | ICD-10-CM | POA: Diagnosis not present

## 2014-02-11 ENCOUNTER — Telehealth: Payer: Self-pay | Admitting: Family Medicine

## 2014-02-11 DIAGNOSIS — I10 Essential (primary) hypertension: Secondary | ICD-10-CM

## 2014-02-11 DIAGNOSIS — E7800 Pure hypercholesterolemia, unspecified: Secondary | ICD-10-CM

## 2014-02-11 DIAGNOSIS — R739 Hyperglycemia, unspecified: Secondary | ICD-10-CM

## 2014-02-11 NOTE — Telephone Encounter (Signed)
Message copied by Abner Greenspan on Thu Feb 11, 2014 10:18 PM ------      Message from: Ellamae Sia      Created: Tue Feb 02, 2014 12:40 PM      Regarding: Lab orders for Monday, 4.13.15       Patient is scheduled for CPX labs, please order future labs, Thanks , Terri       ------

## 2014-02-15 ENCOUNTER — Telehealth: Payer: Self-pay | Admitting: Radiology

## 2014-02-15 ENCOUNTER — Other Ambulatory Visit (INDEPENDENT_AMBULATORY_CARE_PROVIDER_SITE_OTHER): Payer: Medicare Other

## 2014-02-15 DIAGNOSIS — E7800 Pure hypercholesterolemia, unspecified: Secondary | ICD-10-CM

## 2014-02-15 DIAGNOSIS — R739 Hyperglycemia, unspecified: Secondary | ICD-10-CM

## 2014-02-15 DIAGNOSIS — R7309 Other abnormal glucose: Secondary | ICD-10-CM

## 2014-02-15 DIAGNOSIS — I10 Essential (primary) hypertension: Secondary | ICD-10-CM | POA: Diagnosis not present

## 2014-02-15 LAB — CBC WITH DIFFERENTIAL/PLATELET
BASOS PCT: 0.4 % (ref 0.0–3.0)
Basophils Absolute: 0 10*3/uL (ref 0.0–0.1)
Eosinophils Absolute: 0.2 10*3/uL (ref 0.0–0.7)
Eosinophils Relative: 3 % (ref 0.0–5.0)
HCT: 53.1 % — ABNORMAL HIGH (ref 36.0–46.0)
Hemoglobin: 18.1 g/dL (ref 12.0–15.0)
LYMPHS ABS: 2.5 10*3/uL (ref 0.7–4.0)
Lymphocytes Relative: 30.1 % (ref 12.0–46.0)
MCHC: 34 g/dL (ref 30.0–36.0)
MCV: 95.1 fl (ref 78.0–100.0)
Monocytes Absolute: 0.7 10*3/uL (ref 0.1–1.0)
Monocytes Relative: 8.1 % (ref 3.0–12.0)
Neutro Abs: 4.8 10*3/uL (ref 1.4–7.7)
Neutrophils Relative %: 58.4 % (ref 43.0–77.0)
Platelets: 194 10*3/uL (ref 150.0–400.0)
RBC: 5.59 Mil/uL — AB (ref 3.87–5.11)
RDW: 12.8 % (ref 11.5–14.6)
WBC: 8.3 10*3/uL (ref 4.5–10.5)

## 2014-02-15 LAB — COMPREHENSIVE METABOLIC PANEL
ALT: 14 U/L (ref 0–35)
AST: 18 U/L (ref 0–37)
Albumin: 3.7 g/dL (ref 3.5–5.2)
Alkaline Phosphatase: 57 U/L (ref 39–117)
BILIRUBIN TOTAL: 0.6 mg/dL (ref 0.3–1.2)
BUN: 16 mg/dL (ref 6–23)
CALCIUM: 10 mg/dL (ref 8.4–10.5)
CO2: 30 mEq/L (ref 19–32)
CREATININE: 0.9 mg/dL (ref 0.4–1.2)
Chloride: 100 mEq/L (ref 96–112)
GFR: 67.28 mL/min (ref >60.00)
Glucose, Bld: 105 mg/dL — ABNORMAL HIGH (ref 70–99)
Potassium: 3.7 mEq/L (ref 3.5–5.1)
Sodium: 140 mEq/L (ref 135–145)
Total Protein: 7.1 g/dL (ref 6.0–8.3)

## 2014-02-15 LAB — LIPID PANEL
CHOLESTEROL: 190 mg/dL (ref 0–200)
HDL: 47 mg/dL (ref >39.00)
LDL Cholesterol: 122 mg/dL — ABNORMAL HIGH (ref 0–99)
Total CHOL/HDL Ratio: 4
Triglycerides: 104 mg/dL (ref 0.0–149.0)
VLDL: 20.8 mg/dL (ref 0.0–40.0)

## 2014-02-15 LAB — HEMOGLOBIN A1C: Hgb A1c MFr Bld: 6.3 % (ref 4.6–6.5)

## 2014-02-15 LAB — TSH: TSH: 0.36 u[IU]/mL (ref 0.35–5.50)

## 2014-02-15 NOTE — Telephone Encounter (Signed)
Santiago Glad from [facility] called a critical HGB- 18.1. Results given to Dr tower

## 2014-02-15 NOTE — Telephone Encounter (Signed)
Aware-she is a smoker - will review profile when it returns

## 2014-02-22 ENCOUNTER — Encounter: Payer: Self-pay | Admitting: Family Medicine

## 2014-02-22 ENCOUNTER — Ambulatory Visit (INDEPENDENT_AMBULATORY_CARE_PROVIDER_SITE_OTHER): Payer: Medicare Other | Admitting: Family Medicine

## 2014-02-22 VITALS — BP 142/70 | HR 73 | Temp 97.6°F | Ht 66.0 in | Wt 199.8 lb

## 2014-02-22 DIAGNOSIS — R7309 Other abnormal glucose: Secondary | ICD-10-CM | POA: Diagnosis not present

## 2014-02-22 DIAGNOSIS — R739 Hyperglycemia, unspecified: Secondary | ICD-10-CM

## 2014-02-22 DIAGNOSIS — F17200 Nicotine dependence, unspecified, uncomplicated: Secondary | ICD-10-CM

## 2014-02-22 DIAGNOSIS — I10 Essential (primary) hypertension: Secondary | ICD-10-CM

## 2014-02-22 DIAGNOSIS — Z23 Encounter for immunization: Secondary | ICD-10-CM

## 2014-02-22 DIAGNOSIS — D751 Secondary polycythemia: Secondary | ICD-10-CM | POA: Insufficient documentation

## 2014-02-22 DIAGNOSIS — E669 Obesity, unspecified: Secondary | ICD-10-CM | POA: Diagnosis not present

## 2014-02-22 DIAGNOSIS — E7800 Pure hypercholesterolemia, unspecified: Secondary | ICD-10-CM

## 2014-02-22 DIAGNOSIS — R0989 Other specified symptoms and signs involving the circulatory and respiratory systems: Secondary | ICD-10-CM

## 2014-02-22 DIAGNOSIS — Z0000 Encounter for general adult medical examination without abnormal findings: Secondary | ICD-10-CM | POA: Diagnosis not present

## 2014-02-22 NOTE — Assessment & Plan Note (Signed)
Pt continues to decline eval or tx for this  Disc risks

## 2014-02-22 NOTE — Assessment & Plan Note (Signed)
Disc goals for lipids and reasons to control them Rev labs with pt Rev low sat fat diet in detail This is up  Enc low fat diet and gave handout as well

## 2014-02-22 NOTE — Assessment & Plan Note (Signed)
Reviewed health habits including diet and exercise and skin cancer prevention Reviewed appropriate screening tests for age  Also reviewed health mt list, fam hx and immunization status , as well as social and family history   See HPI Labs reviewed Counseled on smoking cessation

## 2014-02-22 NOTE — Assessment & Plan Note (Signed)
bp in fair control at this time  BP Readings from Last 1 Encounters:  02/22/14 142/70   No changes needed Disc lifstyle change with low sodium diet and exercise  Labs reviewed  Enc walking and wt loss

## 2014-02-22 NOTE — Assessment & Plan Note (Signed)
Suspect from smoking Enc pt strongly to cut back or better yet quit for this and disc risks of it  She thinks she can make some progress with that  Re check planned  May need to consider heme ref if no imp

## 2014-02-22 NOTE — Progress Notes (Signed)
Pre visit review using our clinic review tool, if applicable. No additional management support is needed unless otherwise documented below in the visit note. 

## 2014-02-22 NOTE — Patient Instructions (Signed)
Don't forget to schedule your mammogram in June  For cholesterol Avoid red meat/ fried foods/ egg yolks/ fatty breakfast meats/ butter, cheese and high fat dairy/ and shellfish   prevnar pneumonia vaccine today if we have it  Your hemoglobin is high from smoking- so cut down the best you can (or quit if possible) and schedule labs in 4-6 weeks to re check this  You do not need to fast for that lab     Fat and Cholesterol Control Diet Fat and cholesterol levels in your blood and organs are influenced by your diet. High levels of fat and cholesterol may lead to diseases of the heart, small and large blood vessels, gallbladder, liver, and pancreas. CONTROLLING FAT AND CHOLESTEROL WITH DIET Although exercise and lifestyle factors are important, your diet is key. That is because certain foods are known to raise cholesterol and others to lower it. The goal is to balance foods for their effect on cholesterol and more importantly, to replace saturated and trans fat with other types of fat, such as monounsaturated fat, polyunsaturated fat, and omega-3 fatty acids. On average, a person should consume no more than 15 to 17 g of saturated fat daily. Saturated and trans fats are considered "bad" fats, and they will raise LDL cholesterol. Saturated fats are primarily found in animal products such as meats, butter, and cream. However, that does not mean you need to give up all your favorite foods. Today, there are good tasting, low-fat, low-cholesterol substitutes for most of the things you like to eat. Choose low-fat or nonfat alternatives. Choose round or loin cuts of red meat. These types of cuts are lowest in fat and cholesterol. Chicken (without the skin), fish, veal, and ground [place] breast are great choices. Eliminate fatty meats, such as hot dogs and salami. Even shellfish have little or no saturated fat. Have a 3 oz (85 g) portion when you eat lean meat, poultry, or fish. Trans fats are also called  "partially hydrogenated oils." They are oils that have been scientifically manipulated so that they are solid at room temperature resulting in a longer shelf life and improved taste and texture of foods in which they are added. Trans fats are found in stick margarine, some tub margarines, cookies, crackers, and baked goods.  When baking and cooking, oils are a great substitute for butter. The monounsaturated oils are especially beneficial since it is believed they lower LDL and raise HDL. The oils you should avoid entirely are saturated tropical oils, such as coconut and palm.  Remember to eat a lot from food groups that are naturally free of saturated and trans fat, including fish, fruit, vegetables, beans, grains (barley, rice, couscous, bulgur wheat), and pasta (without cream sauces).  IDENTIFYING FOODS THAT LOWER FAT AND CHOLESTEROL  Soluble fiber may lower your cholesterol. This type of fiber is found in fruits such as apples, vegetables such as broccoli, potatoes, and carrots, legumes such as beans, peas, and lentils, and grains such as barley. Foods fortified with plant sterols (phytosterol) may also lower cholesterol. You should eat at least 2 g per day of these foods for a cholesterol lowering effect.  Read package labels to identify low-saturated fats, trans fat free, and low-fat foods at the supermarket. Select cheeses that have only 2 to 3 g saturated fat per ounce. Use a heart-healthy tub margarine that is free of trans fats or partially hydrogenated oil. When buying baked goods (cookies, crackers), avoid partially hydrogenated oils. Breads and muffins should be  made from whole grains (whole-wheat or whole oat flour, instead of "flour" or "enriched flour"). Buy non-creamy canned soups with reduced salt and no added fats.  FOOD PREPARATION TECHNIQUES  Never deep-fry. If you must fry, either stir-fry, which uses very little fat, or use non-stick cooking sprays. When possible, broil, bake, or roast  meats, and steam vegetables. Instead of putting butter or margarine on vegetables, use lemon and herbs, applesauce, and cinnamon (for squash and sweet potatoes). Use nonfat yogurt, salsa, and low-fat dressings for salads.  LOW-SATURATED FAT / LOW-FAT FOOD SUBSTITUTES Meats / Saturated Fat (g)  Avoid: Steak, marbled (3 oz/85 g) / 11 g  Choose: Steak, lean (3 oz/85 g) / 4 g  Avoid: Hamburger (3 oz/85 g) / 7 g  Choose: Hamburger, lean (3 oz/85 g) / 5 g  Avoid: Ham (3 oz/85 g) / 6 g  Choose: Ham, lean cut (3 oz/85 g) / 2.4 g  Avoid: Chicken, with skin, dark meat (3 oz/85 g) / 4 g  Choose: Chicken, skin removed, dark meat (3 oz/85 g) / 2 g  Avoid: Chicken, with skin, light meat (3 oz/85 g) / 2.5 g  Choose: Chicken, skin removed, light meat (3 oz/85 g) / 1 g Dairy / Saturated Fat (g)  Avoid: Whole milk (1 cup) / 5 g  Choose: Low-fat milk, 2% (1 cup) / 3 g  Choose: Low-fat milk, 1% (1 cup) / 1.5 g  Choose: Skim milk (1 cup) / 0.3 g  Avoid: Hard cheese (1 oz/28 g) / 6 g  Choose: Skim milk cheese (1 oz/28 g) / 2 to 3 g  Avoid: Cottage cheese, 4% fat (1 cup) / 6.5 g  Choose: Low-fat cottage cheese, 1% fat (1 cup) / 1.5 g  Avoid: Ice cream (1 cup) / 9 g  Choose: Sherbet (1 cup) / 2.5 g  Choose: Nonfat frozen yogurt (1 cup) / 0.3 g  Choose: Frozen fruit bar / trace  Avoid: Whipped cream (1 tbs) / 3.5 g  Choose: Nondairy whipped topping (1 tbs) / 1 g Condiments / Saturated Fat (g)  Avoid: Mayonnaise (1 tbs) / 2 g  Choose: Low-fat mayonnaise (1 tbs) / 1 g  Avoid: Butter (1 tbs) / 7 g  Choose: Extra light margarine (1 tbs) / 1 g  Avoid: Coconut oil (1 tbs) / 11.8 g  Choose: Olive oil (1 tbs) / 1.8 g  Choose: Corn oil (1 tbs) / 1.7 g  Choose: Safflower oil (1 tbs) / 1.2 g  Choose: Sunflower oil (1 tbs) / 1.4 g  Choose: Soybean oil (1 tbs) / 2.4 g  Choose: Canola oil (1 tbs) / 1 g Document Released: 10/22/2005 Document Revised: 02/16/2013 Document Reviewed:  04/12/2011 Exit[employer] Patient Information 2014 [place], [place].

## 2014-02-22 NOTE — Assessment & Plan Note (Signed)
Stable Lab Results  Component Value Date   HGBA1C 6.3 02/15/2014   enc further low glycemic diet and wt loss to prevent DM

## 2014-02-22 NOTE — Progress Notes (Signed)
Subjective:    Patient ID: Regina Harris, female    DOB: 01-17-1936, 78 y.o.   MRN: [id number]  HPI I have personally reviewed the Medicare Annual Wellness questionnaire and have noted 1. The patient's medical and social history 2. Their use of alcohol, tobacco or illicit drugs 3. Their current medications and supplements 4. The patient's functional ability including ADL's, fall risks, home safety risks and hearing or visual             impairment. 5. Diet and physical activities 6. Evidence for depression or mood disorders  The patients weight, height, BMI have been recorded in the chart and visual acuity is per eye clinic.  I have made referrals, counseling and provided education to the patient based review of the above and I have provided the pt with a written personalized care plan for preventive services.  See scanned forms.  Routine anticipatory guidance given to patient.  See health maintenance. Colon cancer screening 4/15 - found one polyp - 5 year recall  Breast cancer screening mammo (after bx) was 6/14 - Dr Fleet Contras Self breast exam-no new lumps or changes  Flu vaccine 10/14  Tetanus vaccine 8/13  Pneumovax 6/06  She is interested in prevnar vaccine  Zoster vaccine -insurance will not cover it - cannot afford it   Advance directive- has a living will set up  Cognitive function addressed- see scanned forms- and if abnormal then additional documentation follows. -no problems at all   PMH and [place] reviewed  Meds, vitals, and allergies reviewed.   ROS: See HPI.  Otherwise negative.      hyperlipidemia Lab Results  Component Value Date   CHOL 190 02/15/2014   CHOL 178 08/24/2013   CHOL 171 06/17/2012   Lab Results  Component Value Date   HDL 47.00 02/15/2014   HDL 46.20 08/24/2013   HDL 48.80 06/17/2012   Lab Results  Component Value Date   LDLCALC 122* 02/15/2014   LDLCALC 111* 08/24/2013   LDLCALC 100* 06/17/2012   Lab Results  Component Value Date   TRIG  104.0 02/15/2014   TRIG 103.0 08/24/2013   TRIG 111.0 06/17/2012   Lab Results  Component Value Date   CHOLHDL 4 02/15/2014   CHOLHDL 4 08/24/2013   CHOLHDL 4 06/17/2012   No results found for this basename: LDLDIRECT  this is up slightly  She does not always eat a lot fat diet -thinks she can do better with this     dexa 2013 nl No fractures Takes ca and D   HB is high Lab Results  Component Value Date   WBC 8.3 02/15/2014   HGB 18.1 Repeated and verified X2.* 02/15/2014   HCT 53.1* 02/15/2014   MCV 95.1 02/15/2014   PLT 194.0 02/15/2014   she is a smoker - a pack or more a day She does think about quitting - she did quit once for 3 days Thinks she could cut back    bp is stable today  No cp or palpitations or headaches or edema  No side effects to medicines  BP Readings from Last 3 Encounters:  02/22/14 142/70  08/24/13 130/68  05/06/13 132/60     Hyperglycemia Lab Results  Component Value Date   HGBA1C 6.3 02/15/2014   she has been eating less sugar  Some walking for exercise    Patient Active Problem List   Diagnosis Date Noted  . Encounter for Medicare annual wellness exam 02/22/2014  . Polycythemia,  secondary 02/22/2014  . GERD (gastroesophageal reflux disease) 08/24/2013  . Lump or mass in breast 05/06/2013  . Post-menopausal 06/24/2012  . Obesity 12/24/2011  . Hyperglycemia 03/12/2011  . NEOPLASM UNSPECIFIED NATURE DIGESTIVE SYSTEM 07/24/2010  . LUNG NODULE 01/16/2010  . HYPERCHOLESTEROLEMIA, PURE 07/10/2007  . TOBACCO ABUSE 03/19/2007  . HYPERTENSION 03/19/2007  . CAROTID BRUIT, RIGHT 03/19/2007  . COLONIC POLYPS, HX OF 03/19/2007   Past Medical History  Diagnosis Date  . History of colonic polyps   . Diabetes mellitus   . Hypertension   . Tobacco abuse   . Carotid bruit   . Hyperlipidemia   . GERD (gastroesophageal reflux disease)   . Esophagitis   . HH (hiatus hernia)   . Pneumonia   . Gastritis 2013  . Lump or mass in breast    Past  Surgical History  Procedure Laterality Date  . Appendectomy    . Cholecystectomy    . Abdominal hysterectomy    . Cataract extraction  3/11    Dr Charise Killian  . Upper gi endoscopy  2013  . Colonoscopy  2009  . Breast biopsy Right 1996  . Breast biopsy Left 10/09/2012    Benign breast tissue with focal fat necrosis and focal periductal chronic inflammation.  . Breast biopsy Left 10/09/2012    Benign breast tissue with focal fat necrosis and focal periductal chronic inflammation.   History  Substance Use Topics  . Smoking status: Current Every Day Smoker -- 1.00 packs/day    Types: Cigarettes  . Smokeless tobacco: Never Used  . Alcohol Use: No   Family History  Problem Relation Age of Onset  . Kidney cancer Father   . Diabetes Mother    No Known Allergies Current Outpatient Prescriptions on File Prior to Visit  Medication Sig Dispense Refill  . aspirin 325 MG tablet Take 325 mg by mouth daily.        Marland Kitchen CALCIUM-VITAMIN D PO Take 1 tablet by mouth daily.        Marland Kitchen docusate sodium (COLACE) 100 MG capsule Take 100 mg by mouth 2 (two) times daily.      . hydrochlorothiazide (HYDRODIURIL) 25 MG tablet Take 1 tablet (25 mg total) by mouth daily.  90 tablet  3  . Multiple Vitamin (MULTIVITAMIN) tablet Take 1 tablet by mouth daily.        . Omega-3 Fatty Acids (FISH OIL) 1000 MG CAPS Take 1 capsule by mouth as directed.        Marland Kitchen omeprazole (PRILOSEC) 20 MG capsule Take 1 capsule (20 mg total) by mouth 2 (two) times daily.  180 capsule  3  . potassium chloride (K-DUR) 10 MEQ tablet Take 1 tablet (10 mEq total) by mouth daily.  30 tablet  3   No current facility-administered medications on file prior to visit.    Review of Systems Review of Systems  Constitutional: Negative for fever, appetite change,  and unexpected weight change.  Eyes: Negative for pain and visual disturbance.  Respiratory: Negative for cough and shortness of breath.   Cardiovascular: Negative for cp or palpitations      Gastrointestinal: Negative for nausea, diarrhea and constipation.  Genitourinary: Negative for urgency and frequency.  Skin: Negative for pallor or rash  MSK pos for some arthritis pain   Neurological: Negative for weakness, light-headedness, numbness and headaches.  Hematological: Negative for adenopathy. Does not bruise/bleed easily.  Psychiatric/Behavioral: Negative for dysphoric mood. The patient is not nervous/anxious.  Objective:   Physical Exam  Constitutional: She appears well-developed and well-nourished. No distress.  obese and well appearing   HENT:  Head: Normocephalic and atraumatic.  Right Ear: External ear normal.  Left Ear: External ear normal.  Mouth/Throat: Oropharynx is clear and moist.  Eyes: Conjunctivae and EOM are normal. Pupils are equal, round, and reactive to light. No scleral icterus.  Neck: Normal range of motion. Neck supple. No JVD present. Carotid bruit is present. No thyromegaly present.  Right carotid bruit baseline   Cardiovascular: Normal rate, regular rhythm, normal heart sounds and intact distal pulses.  Exam reveals no gallop.   Pulmonary/Chest: Effort normal and breath sounds normal. No respiratory distress. She has no wheezes. She exhibits no tenderness.  Diffusely distant bs   Abdominal: Soft. Bowel sounds are normal. She exhibits no distension, no abdominal bruit and no mass. There is no tenderness.  Genitourinary: No breast swelling, tenderness, discharge or bleeding.  Musculoskeletal: Normal range of motion. She exhibits no edema and no tenderness.  Lymphadenopathy:    She has no cervical adenopathy.  Neurological: She is alert. She has normal reflexes. No cranial nerve deficit. She exhibits normal muscle tone. Coordination normal.  Skin: Skin is warm and dry. No rash noted. No erythema. No pallor.  Psychiatric: She has a normal mood and affect.          Assessment & Plan:

## 2014-02-22 NOTE — Assessment & Plan Note (Addendum)
Disc in detail risks of smoking and possible outcomes including copd, vascular/ heart disease, cancer , respiratory and sinus infections  Pt voices understanding  She is not ready to quit but in interest of elevated Hb will try to cut back

## 2014-02-22 NOTE — Assessment & Plan Note (Signed)
Discussed how this problem influences overall health and the risks it imposes  Reviewed plan for weight loss with lower calorie diet (via better food choices and also portion control or program like weight watchers) and exercise building up to or more than 30 minutes 5 days per week including some aerobic activity    

## 2014-02-23 ENCOUNTER — Telehealth: Payer: Self-pay | Admitting: Family Medicine

## 2014-02-23 NOTE — Telephone Encounter (Signed)
Relevant patient education assigned to patient using Emmi. ° °

## 2014-04-05 ENCOUNTER — Other Ambulatory Visit (INDEPENDENT_AMBULATORY_CARE_PROVIDER_SITE_OTHER): Payer: Medicare Other

## 2014-04-05 DIAGNOSIS — D751 Secondary polycythemia: Secondary | ICD-10-CM

## 2014-04-05 LAB — CBC WITH DIFFERENTIAL/PLATELET
Basophils Absolute: 0 10*3/uL (ref 0.0–0.1)
Basophils Relative: 0.4 % (ref 0.0–3.0)
EOS ABS: 0.2 10*3/uL (ref 0.0–0.7)
Eosinophils Relative: 2.7 % (ref 0.0–5.0)
HCT: 51.2 % — ABNORMAL HIGH (ref 36.0–46.0)
Hemoglobin: 17.3 g/dL — ABNORMAL HIGH (ref 12.0–15.0)
Lymphocytes Relative: 26.2 % (ref 12.0–46.0)
Lymphs Abs: 2.2 10*3/uL (ref 0.7–4.0)
MCHC: 33.8 g/dL (ref 30.0–36.0)
MCV: 95.6 fl (ref 78.0–100.0)
MONO ABS: 0.6 10*3/uL (ref 0.1–1.0)
Monocytes Relative: 7.2 % (ref 3.0–12.0)
NEUTROS ABS: 5.4 10*3/uL (ref 1.4–7.7)
NEUTROS PCT: 63.5 % (ref 43.0–77.0)
Platelets: 190 10*3/uL (ref 150.0–400.0)
RBC: 5.35 Mil/uL — ABNORMAL HIGH (ref 3.87–5.11)
RDW: 12.6 % (ref 11.5–15.5)
WBC: 8.5 10*3/uL (ref 4.0–10.5)

## 2014-04-07 ENCOUNTER — Telehealth: Payer: Self-pay | Admitting: Family Medicine

## 2014-04-07 NOTE — Telephone Encounter (Signed)
Patient returned your call you can call her back at home or at [phone].

## 2014-04-07 NOTE — Telephone Encounter (Signed)
Addressed through her labs

## 2014-05-02 ENCOUNTER — Other Ambulatory Visit: Payer: Self-pay | Admitting: Family Medicine

## 2014-06-17 DIAGNOSIS — H2510 Age-related nuclear cataract, unspecified eye: Secondary | ICD-10-CM | POA: Diagnosis not present

## 2014-06-30 ENCOUNTER — Telehealth: Payer: Self-pay | Admitting: *Deleted

## 2014-06-30 ENCOUNTER — Ambulatory Visit: Payer: Self-pay | Admitting: General Surgery

## 2014-06-30 DIAGNOSIS — N6320 Unspecified lump in the left breast, unspecified quadrant: Secondary | ICD-10-CM | POA: Insufficient documentation

## 2014-06-30 NOTE — Telephone Encounter (Signed)
Spoke with Estill Bamberg and pt didn't want to wait the 1-2 hrs it may take to do the diagnostic mammogram and [place] so pt r/s appt until 07/07/14, they still need order done but it's not STAT anymore

## 2014-06-30 NOTE — Telephone Encounter (Signed)
Pt is there for screening mammogram but she advise them that she felt a left breast mass so they need an order for a diagnostic mammogram. Estill Bamberg said the order needs to say bilateral diagnostic mammogram and the reason has to be left breast mass, also the radiologist will to a [place] so they said include that in the order too but they want to do an [place] on both breast so you have to say, [place] of left and right breast also.  Estill Bamberg said order needs to be faxed to her at fax # [phone]

## 2014-06-30 NOTE — Telephone Encounter (Signed)
done

## 2014-06-30 NOTE — Telephone Encounter (Signed)
Orders faxed

## 2014-07-07 ENCOUNTER — Encounter: Payer: Self-pay | Admitting: Family Medicine

## 2014-07-07 ENCOUNTER — Encounter: Payer: Self-pay | Admitting: *Deleted

## 2014-07-07 ENCOUNTER — Ambulatory Visit: Payer: Self-pay | Admitting: Family Medicine

## 2014-07-07 DIAGNOSIS — R922 Inconclusive mammogram: Secondary | ICD-10-CM | POA: Diagnosis not present

## 2014-07-07 DIAGNOSIS — N63 Unspecified lump in unspecified breast: Secondary | ICD-10-CM | POA: Diagnosis not present

## 2014-08-08 ENCOUNTER — Other Ambulatory Visit: Payer: Self-pay | Admitting: Family Medicine

## 2014-09-06 ENCOUNTER — Encounter: Payer: Self-pay | Admitting: Family Medicine

## 2014-09-21 ENCOUNTER — Ambulatory Visit: Payer: Medicare Other

## 2014-09-21 ENCOUNTER — Ambulatory Visit (INDEPENDENT_AMBULATORY_CARE_PROVIDER_SITE_OTHER): Payer: Medicare Other | Admitting: *Deleted

## 2014-09-21 DIAGNOSIS — Z23 Encounter for immunization: Secondary | ICD-10-CM | POA: Diagnosis not present

## 2014-09-24 ENCOUNTER — Other Ambulatory Visit: Payer: Self-pay | Admitting: Family Medicine

## 2014-09-27 ENCOUNTER — Other Ambulatory Visit: Payer: Self-pay

## 2014-09-27 MED ORDER — OMEPRAZOLE 20 MG PO CPDR: 20.0000 mg | DELAYED_RELEASE_CAPSULE | Freq: Two times a day (BID) | ORAL | Status: DC

## 2014-09-27 NOTE — Telephone Encounter (Signed)
[name] pts granddaughter request refill omeprazole to [facility]; done.

## 2014-10-05 ENCOUNTER — Encounter: Payer: Self-pay | Admitting: General Surgery

## 2014-10-05 ENCOUNTER — Ambulatory Visit (INDEPENDENT_AMBULATORY_CARE_PROVIDER_SITE_OTHER): Payer: Medicare Other | Admitting: General Surgery

## 2014-10-05 VITALS — BP 140/82 | HR 78 | Resp 12 | Ht 66.0 in | Wt 210.0 lb

## 2014-10-05 DIAGNOSIS — R222 Localized swelling, mass and lump, trunk: Secondary | ICD-10-CM

## 2014-10-05 DIAGNOSIS — M940 Chondrocostal junction syndrome [Tietze]: Secondary | ICD-10-CM | POA: Diagnosis not present

## 2014-10-05 NOTE — Progress Notes (Signed)
Patient ID: Regina Harris, female   DOB: [birth year], 78 y.o.   MRN: [id number]  Chief Complaint  Patient presents with  . Breast Problem    HPI Regina Harris is a 78 y.o. female.  Here today for evaluation of a knot right chest wall near her breast. States it has been there about a month. It has not changed in size and no pain. The patient noticed this after she had been splitting wood.   HPI  Past Medical History  Diagnosis Date  . History of colonic polyps   . Diabetes mellitus   . Hypertension   . Tobacco abuse   . Carotid bruit   . Hyperlipidemia   . GERD (gastroesophageal reflux disease)   . Esophagitis   . HH (hiatus hernia)   . Pneumonia   . Gastritis 2013  . Lump or mass in breast     Past Surgical History  Procedure Laterality Date  . Appendectomy    . Cholecystectomy    . Abdominal hysterectomy    . Cataract extraction  3/11    Dr Charise Killian  . Upper gi endoscopy  2013  . Colonoscopy  2009  . Breast biopsy Right 1996  . Breast biopsy Left 10/09/2012    Benign breast tissue with focal fat necrosis and focal periductal chronic inflammation.  . Breast biopsy Left 10/09/2012    Benign breast tissue with focal fat necrosis and focal periductal chronic inflammation.    Family History  Problem Relation Age of Onset  . Kidney cancer Father   . Diabetes Mother     Social History History  Substance Use Topics  . Smoking status: Current Every Day Smoker -- 1.00 packs/day    Types: Cigarettes  . Smokeless tobacco: Never Used  . Alcohol Use: No    No Known Allergies  Current Outpatient Prescriptions  Medication Sig Dispense Refill  . aspirin 325 MG tablet Take 325 mg by mouth daily.      Marland Kitchen CALCIUM-VITAMIN D PO Take 1 tablet by mouth daily.      Marland Kitchen docusate sodium (COLACE) 100 MG capsule Take 100 mg by mouth 2 (two) times daily.    . hydrochlorothiazide (HYDRODIURIL) 25 MG tablet TAKE 1 TABLET DAILY 90 tablet 1  . KLOR-CON M10 10 MEQ tablet TAKE 1 TABLET (10 MEQ  TOTAL) BY MOUTH DAILY. 30 tablet 5  . Multiple Vitamin (MULTIVITAMIN) tablet Take 1 tablet by mouth daily.      . Omega-3 Fatty Acids (FISH OIL) 1000 MG CAPS Take 1 capsule by mouth as directed.      Marland Kitchen omeprazole (PRILOSEC) 20 MG capsule Take 1 capsule (20 mg total) by mouth 2 (two) times daily. 180 capsule 1  . [DISCONTINUED] potassium chloride (K-DUR) 10 MEQ tablet Take 1 tablet (10 mEq total) by mouth daily. 30 tablet 3   No current facility-administered medications for this visit.    Review of Systems Review of Systems  Blood pressure 140/82, pulse 78, resp. rate 12, height 5\' 6"  (1.676 m), weight 210 lb (95.255 kg).  Physical Exam Physical Exam  Constitutional: She is oriented to person, place, and time. She appears well-developed and well-nourished.  Neck: Neck supple.  Cardiovascular: Normal rate, regular rhythm and normal heart sounds.   Pulmonary/Chest: Effort normal and breath sounds normal.    Lymphadenopathy:    She has no cervical adenopathy.  Neurological: She is alert and oriented to person, place, and time.  Skin: Skin is warm and dry.  tenderness 3rd right costal junction      Assessment    Prominence of the third right costochondral junction.    Plan    Because of the extensive bruising the patient has been experiencing it has been recommended she make use of an 81 mg aspirin rather than 325. She had chosen 325 dose on her own by report. No indication for intervention regarding the prominence of the chest wall joint at this time.  Follow up as needed.    PCP:  Tower, [place] 10/06/2014, 9:32 PM

## 2014-10-05 NOTE — Patient Instructions (Addendum)
The patient is aware to call back for any questions or concerns. Recommend 81 mg aspirin in place of the 325 mg aspirin

## 2014-10-06 DIAGNOSIS — R222 Localized swelling, mass and lump, trunk: Secondary | ICD-10-CM | POA: Insufficient documentation

## 2014-10-29 ENCOUNTER — Telehealth: Payer: Self-pay | Admitting: Family Medicine

## 2014-11-01 NOTE — Telephone Encounter (Signed)
Appointment 5/11/ Pt aware Please close

## 2014-11-01 NOTE — Telephone Encounter (Signed)
Med refilled.

## 2014-11-01 NOTE — Telephone Encounter (Signed)
Electronic refill request, no recent/future appt., please advise  

## 2014-11-01 NOTE — Telephone Encounter (Signed)
Please schedule annual exam after 4/20 and refill until then

## 2015-03-08 ENCOUNTER — Other Ambulatory Visit: Payer: Self-pay | Admitting: Family Medicine

## 2015-03-16 ENCOUNTER — Ambulatory Visit (INDEPENDENT_AMBULATORY_CARE_PROVIDER_SITE_OTHER): Payer: Medicare Other | Admitting: Family Medicine

## 2015-03-16 ENCOUNTER — Encounter: Payer: Self-pay | Admitting: Family Medicine

## 2015-03-16 VITALS — BP 118/64 | HR 64 | Temp 97.8°F | Ht 66.5 in | Wt 203.0 lb

## 2015-03-16 DIAGNOSIS — Z0000 Encounter for general adult medical examination without abnormal findings: Secondary | ICD-10-CM

## 2015-03-16 DIAGNOSIS — E669 Obesity, unspecified: Secondary | ICD-10-CM

## 2015-03-16 DIAGNOSIS — I10 Essential (primary) hypertension: Secondary | ICD-10-CM

## 2015-03-16 DIAGNOSIS — R739 Hyperglycemia, unspecified: Secondary | ICD-10-CM | POA: Diagnosis not present

## 2015-03-16 DIAGNOSIS — R0989 Other specified symptoms and signs involving the circulatory and respiratory systems: Secondary | ICD-10-CM

## 2015-03-16 DIAGNOSIS — E7800 Pure hypercholesterolemia, unspecified: Secondary | ICD-10-CM

## 2015-03-16 LAB — CBC WITH DIFFERENTIAL/PLATELET
BASOS ABS: 0 10*3/uL (ref 0.0–0.1)
Basophils Relative: 0.4 % (ref 0.0–3.0)
Eosinophils Absolute: 0.2 10*3/uL (ref 0.0–0.7)
Eosinophils Relative: 1.9 % (ref 0.0–5.0)
HCT: 50.8 % — ABNORMAL HIGH (ref 36.0–46.0)
Hemoglobin: 17.2 g/dL — ABNORMAL HIGH (ref 12.0–15.0)
Lymphocytes Relative: 22.9 % (ref 12.0–46.0)
Lymphs Abs: 2.1 10*3/uL (ref 0.7–4.0)
MCHC: 33.8 g/dL (ref 30.0–36.0)
MCV: 92.6 fl (ref 78.0–100.0)
Monocytes Absolute: 0.7 10*3/uL (ref 0.1–1.0)
Monocytes Relative: 7.4 % (ref 3.0–12.0)
NEUTROS PCT: 67.4 % (ref 43.0–77.0)
Neutro Abs: 6.2 10*3/uL (ref 1.4–7.7)
Platelets: 197 10*3/uL (ref 150.0–400.0)
RBC: 5.49 Mil/uL — ABNORMAL HIGH (ref 3.87–5.11)
RDW: 12.7 % (ref 11.5–15.5)
WBC: 9.2 10*3/uL (ref 4.0–10.5)

## 2015-03-16 LAB — COMPREHENSIVE METABOLIC PANEL
ALT: 13 U/L (ref 0–35)
AST: 16 U/L (ref 0–37)
Albumin: 3.8 g/dL (ref 3.5–5.2)
Alkaline Phosphatase: 68 U/L (ref 39–117)
BUN: 17 mg/dL (ref 6–23)
CALCIUM: 9.9 mg/dL (ref 8.4–10.5)
CO2: 30 meq/L (ref 19–32)
Chloride: 101 mEq/L (ref 96–112)
Creatinine, Ser: 0.85 mg/dL (ref 0.40–1.20)
GFR: 68.91 mL/min (ref >60.00)
Glucose, Bld: 109 mg/dL — ABNORMAL HIGH (ref 70–99)
Potassium: 3.5 mEq/L (ref 3.5–5.1)
SODIUM: 138 meq/L (ref 135–145)
TOTAL PROTEIN: 6.9 g/dL (ref 6.0–8.3)
Total Bilirubin: 0.3 mg/dL (ref 0.2–1.2)

## 2015-03-16 LAB — TSH: TSH: 0.41 u[IU]/mL (ref 0.35–4.50)

## 2015-03-16 LAB — LIPID PANEL
Cholesterol: 187 mg/dL (ref 0–200)
HDL: 53.4 mg/dL (ref >39.00)
LDL Cholesterol: 109 mg/dL — ABNORMAL HIGH (ref 0–99)
NONHDL: 133.6
Total CHOL/HDL Ratio: 4
Triglycerides: 124 mg/dL (ref 0.0–149.0)
VLDL: 24.8 mg/dL (ref 0.0–40.0)

## 2015-03-16 LAB — HEMOGLOBIN A1C: HEMOGLOBIN A1C: 6.1 % (ref 4.6–6.5)

## 2015-03-16 NOTE — Assessment & Plan Note (Signed)
Discussed how this problem influences overall health and the risks it imposes  Reviewed plan for weight loss with lower calorie diet (via better food choices and also portion control or program like weight watchers) and exercise building up to or more than 30 minutes 5 days per week including some aerobic activity   Commended on 7 lb wt loss and inc activity

## 2015-03-16 NOTE — Progress Notes (Signed)
Subjective:    Patient ID: Regina Harris, female    DOB: July 23, 1936, 79 y.o.   MRN: [id number]  HPI Here for annual medicare wellness visit as well as chronic/acute medical problems   I have personally reviewed the Medicare Annual Wellness questionnaire and have noted 1. The patient's medical and social history 2. Their use of alcohol, tobacco or illicit drugs 3. Their current medications and supplements 4. The patient's functional ability including ADL's, fall risks, home safety risks and hearing or visual             impairment. 5. Diet and physical activities 6. Evidence for depression or mood disorders  The patients weight, height, BMI have been recorded in the chart and visual acuity is per eye clinic.  I have made referrals, counseling and provided education to the patient based review of the above and I have provided the pt with a written personalized care plan for preventive services. Reviewed and updated provider list, see scanned forms.  Feeling good  Feet hurt - they stay cold but do not tingle   See scanned forms.  Routine anticipatory guidance given to patient.  See health maintenance. Colon cancer screening  -thinks she had 5 year recall ? 2010 or 2015 Breast cancer screening mammogram 9/15  Self breast exam no lumps  Flu vaccine this season 11/15 Tetanus vaccine 8/13 Pneumovax complete 4/15  Zoster vaccine-cannot afford  Bone density nl dexa 9/13  Advance directive she had a living will and cannot find it  Cognitive function addressed- see scanned forms- and if abnormal then additional documentation follows. No concerns at all   PMH and SH reviewed  Meds, vitals, and allergies reviewed.   ROS: See HPI.  Otherwise negative.    Quit smoking in April! - hopes to stay with it   (cold [place])  Feels pretty good /not a lot of craving   Lost 7 lb also bmi 32  Is eating better and busy - splitting a lot of wood-keeping busy   bp is stable today  No cp or  palpitations or headaches or edema  No side effects to medicines  BP Readings from Last 3 Encounters:  03/16/15 118/64  10/05/14 140/82  02/22/14 142/70     Hyperglycemia Lab Results  Component Value Date   HGBA1C 6.3 02/15/2014    Due for chol check Lab Results  Component Value Date   CHOL 190 02/15/2014   HDL 47.00 02/15/2014   LDLCALC 122* 02/15/2014   TRIG 104.0 02/15/2014   CHOLHDL 4 02/15/2014       Patient Active Problem List   Diagnosis Date Noted  . Nodule of chest wall 10/06/2014  . Left breast mass 06/30/2014  . Encounter for Medicare annual wellness exam 02/22/2014  . Polycythemia, secondary 02/22/2014  . GERD (gastroesophageal reflux disease) 08/24/2013  . Lump or mass in breast 05/06/2013  . Post-menopausal 06/24/2012  . Obesity 12/24/2011  . Hyperglycemia 03/12/2011  . NEOPLASM UNSPECIFIED NATURE DIGESTIVE SYSTEM 07/24/2010  . LUNG NODULE 01/16/2010  . HYPERCHOLESTEROLEMIA, PURE 07/10/2007  . TOBACCO ABUSE 03/19/2007  . Essential hypertension 03/19/2007  . CAROTID BRUIT, RIGHT 03/19/2007  . COLONIC POLYPS, HX OF 03/19/2007   Past Medical History  Diagnosis Date  . History of colonic polyps   . Diabetes mellitus   . Hypertension   . Tobacco abuse   . Carotid bruit   . Hyperlipidemia   . GERD (gastroesophageal reflux disease)   . Esophagitis   . HH (  hiatus hernia)   . Pneumonia   . Gastritis 2013  . Lump or mass in breast    Past Surgical History  Procedure Laterality Date  . Appendectomy    . Cholecystectomy    . Abdominal hysterectomy    . Cataract extraction  3/11    Dr Charise Killian  . Upper gi endoscopy  2013  . Colonoscopy  2009  . Breast biopsy Right 1996  . Breast biopsy Left 10/09/2012    Benign breast tissue with focal fat necrosis and focal periductal chronic inflammation.  . Breast biopsy Left 10/09/2012    Benign breast tissue with focal fat necrosis and focal periductal chronic inflammation.   History  Substance Use Topics    . Smoking status: Former Smoker    Types: Cigarettes    Start date: 02/04/2015  . Smokeless tobacco: Never Used  . Alcohol Use: No   Family History  Problem Relation Age of Onset  . Kidney cancer Father   . Diabetes Mother    No Known Allergies Current Outpatient Prescriptions on File Prior to Visit  Medication Sig Dispense Refill  . CALCIUM-VITAMIN D PO Take 1 tablet by mouth daily.      Marland Kitchen docusate sodium (COLACE) 100 MG capsule Take 100 mg by mouth 2 (two) times daily.    . hydrochlorothiazide (HYDRODIURIL) 25 MG tablet TAKE 1 TABLET DAILY 90 tablet 1  . KLOR-CON M10 10 MEQ tablet TAKE 1 TABLET (10 MEQ TOTAL) BY MOUTH DAILY. 30 tablet 5  . Multiple Vitamin (MULTIVITAMIN) tablet Take 1 tablet by mouth daily.      . Omega-3 Fatty Acids (FISH OIL) 1000 MG CAPS Take 1 capsule by mouth as directed.      Marland Kitchen omeprazole (PRILOSEC) 20 MG capsule TAKE 1 CAPSULE TWICE DAILY 180 capsule 3  . [DISCONTINUED] potassium chloride (K-DUR) 10 MEQ tablet Take 1 tablet (10 mEq total) by mouth daily. 30 tablet 3   No current facility-administered medications on file prior to visit.    Review of Systems    Review of Systems  Constitutional: Negative for fever, appetite change, fatigue and unexpected weight change.  Eyes: Negative for pain and visual disturbance.  Respiratory: Negative for cough and shortness of breath.   Cardiovascular: Negative for cp or palpitations    Gastrointestinal: Negative for nausea, diarrhea and constipation.  Genitourinary: Negative for urgency and frequency.  Skin: Negative for pallor or rash   Neurological: Negative for weakness, light-headedness, numbness and headaches.  MSK pos for chronic foot pain , neg for claudication symptoms  Hematological: Negative for adenopathy. Does not bruise/bleed easily.  Psychiatric/Behavioral: Negative for dysphoric mood. The patient is not nervous/anxious.      Objective:   Physical Exam  Constitutional: She appears  well-developed and well-nourished. No distress.  obese and well appearing   HENT:  Head: Normocephalic and atraumatic.  Right Ear: External ear normal.  Left Ear: External ear normal.  Mouth/Throat: Oropharynx is clear and moist.  Eyes: Conjunctivae and EOM are normal. Pupils are equal, round, and reactive to light. No scleral icterus.  Neck: Normal range of motion. Neck supple. No JVD present. Carotid bruit is present. No thyromegaly present.  Cardiovascular: Normal rate, regular rhythm, normal heart sounds and intact distal pulses.  Exam reveals no gallop.   Pulmonary/Chest: Effort normal and breath sounds normal. No respiratory distress. She has no wheezes. She exhibits no tenderness.  Diffusely distant bs  No wheeze Fair air exch  Abdominal: Soft. Bowel sounds are normal.  She exhibits no distension, no abdominal bruit and no mass. There is no tenderness.  Genitourinary: No breast swelling, tenderness, discharge or bleeding.  Breast exam: No mass, nodules, thickening, tenderness, bulging, retraction, inflamation, nipple discharge or skin changes noted.  No axillary or clavicular LA.      Musculoskeletal: Normal range of motion. She exhibits no edema or tenderness.  Lymphadenopathy:    She has no cervical adenopathy.  Neurological: She is alert. She has normal reflexes. No cranial nerve deficit. She exhibits normal muscle tone. Coordination normal.  Skin: Skin is warm and dry. No rash noted. No erythema. No pallor.  Solar aging and lentigo noted diffusely  Psychiatric: She has a normal mood and affect.          Assessment & Plan:   Problem List Items Addressed This Visit    Carotid bruit    Right Pt continues to decline further eval or tx  Will continue ASA      Encounter for Medicare annual wellness exam    Reviewed health habits including diet and exercise and skin cancer prevention Reviewed appropriate screening tests for age  Also reviewed health mt list, fam hx and  immunization status , as well as social and family history   See HPI Labs today  Pt declines w/u of carotid bruit  Will work on Therapist, occupational hypertension - Primary    bp in fair control at this time  BP Readings from Last 1 Encounters:  03/16/15 118/64   No changes needed Disc lifstyle change with low sodium diet and exercise  Labs today      Relevant Medications   aspirin 81 MG tablet   Other Relevant Orders   CBC with Differential/Platelet (Completed)   Comprehensive metabolic panel (Completed)   TSH (Completed)   Lipid panel (Completed)   HYPERCHOLESTEROLEMIA, PURE    Lipid panel today Disc goals for chol and low sat fat diet       Relevant Medications   aspirin 81 MG tablet   Other Relevant Orders   Lipid panel (Completed)   Hyperglycemia    A1C today  Disc imp of control to prev DM  7 lb wt loss noted       Relevant Orders   Hemoglobin A1c (Completed)   Obesity    Discussed how this problem influences overall health and the risks it imposes  Reviewed plan for weight loss with lower calorie diet (via better food choices and also portion control or program like weight watchers) and exercise building up to or more than 30 minutes 5 days per week including some aerobic activity   Commended on 7 lb wt loss and inc activity

## 2015-03-16 NOTE — Assessment & Plan Note (Signed)
Lipid panel today Disc goals for chol and low sat fat diet

## 2015-03-16 NOTE — Assessment & Plan Note (Signed)
bp in fair control at this time  BP Readings from Last 1 Encounters:  03/16/15 118/64   No changes needed Disc lifstyle change with low sodium diet and exercise  Labs today

## 2015-03-16 NOTE — Progress Notes (Signed)
Pre visit review using our clinic review tool, if applicable. No additional management support is needed unless otherwise documented below in the visit note. 

## 2015-03-16 NOTE — Patient Instructions (Signed)
Labs today  Please work on and advance directive/living will if you cannot find yours - I gave you materials Excellent work quitting smoking - keep up the good work

## 2015-03-16 NOTE — Assessment & Plan Note (Signed)
A1C today  Disc imp of control to prev DM  7 lb wt loss noted

## 2015-03-16 NOTE — Assessment & Plan Note (Signed)
Reviewed health habits including diet and exercise and skin cancer prevention Reviewed appropriate screening tests for age  Also reviewed health mt list, fam hx and immunization status , as well as social and family history   See HPI Labs today  Pt declines w/u of carotid bruit  Will work on Insurance underwriter

## 2015-03-17 NOTE — Assessment & Plan Note (Signed)
Right Pt continues to decline further eval or tx  Will continue ASA

## 2015-04-23 ENCOUNTER — Other Ambulatory Visit: Payer: Self-pay | Admitting: Family Medicine

## 2015-06-04 ENCOUNTER — Other Ambulatory Visit: Payer: Self-pay | Admitting: Family Medicine

## 2015-07-24 ENCOUNTER — Emergency Department
Admission: EM | Admit: 2015-07-24 | Discharge: 2015-07-24 | Disposition: A | Discharge status: Home / Self Care | Payer: Medicare Other | Attending: Emergency Medicine | Admitting: Emergency Medicine

## 2015-07-24 ENCOUNTER — Encounter: Payer: Self-pay | Admitting: Emergency Medicine

## 2015-07-24 DIAGNOSIS — I10 Essential (primary) hypertension: Secondary | ICD-10-CM | POA: Insufficient documentation

## 2015-07-24 DIAGNOSIS — Z79899 Other long term (current) drug therapy: Secondary | ICD-10-CM | POA: Insufficient documentation

## 2015-07-24 DIAGNOSIS — Y9289 Other specified places as the place of occurrence of the external cause: Secondary | ICD-10-CM | POA: Diagnosis not present

## 2015-07-24 DIAGNOSIS — Z7982 Long term (current) use of aspirin: Secondary | ICD-10-CM | POA: Insufficient documentation

## 2015-07-24 DIAGNOSIS — Z87891 Personal history of nicotine dependence: Secondary | ICD-10-CM | POA: Diagnosis not present

## 2015-07-24 DIAGNOSIS — E119 Type 2 diabetes mellitus without complications: Secondary | ICD-10-CM | POA: Diagnosis not present

## 2015-07-24 DIAGNOSIS — S51812A Laceration without foreign body of left forearm, initial encounter: Secondary | ICD-10-CM | POA: Insufficient documentation

## 2015-07-24 DIAGNOSIS — Y9389 Activity, other specified: Secondary | ICD-10-CM | POA: Insufficient documentation

## 2015-07-24 DIAGNOSIS — Y998 Other external cause status: Secondary | ICD-10-CM | POA: Insufficient documentation

## 2015-07-24 DIAGNOSIS — Z23 Encounter for immunization: Secondary | ICD-10-CM | POA: Insufficient documentation

## 2015-07-24 MED ORDER — ACETAMINOPHEN 500 MG PO TABS
1000.0000 mg | ORAL_TABLET | Freq: Once | ORAL | Status: AC
  Administered 2015-07-24: 1000 mg via ORAL
  Filled 2015-07-24: qty 2

## 2015-07-24 MED ORDER — LIDOCAINE-EPINEPHRINE (PF) 1 %-1:200000 IJ SOLN
INTRAMUSCULAR | Status: AC
  Administered 2015-07-24: 17:00:00
  Filled 2015-07-24: qty 30

## 2015-07-24 MED ORDER — TETANUS-DIPHTH-ACELL PERTUSSIS 5-2.5-18.5 LF-MCG/0.5 IM SUSP
0.5000 mL | Freq: Once | INTRAMUSCULAR | Status: AC
  Administered 2015-07-24: 0.5 mL via INTRAMUSCULAR
  Filled 2015-07-24: qty 0.5

## 2015-07-24 MED ORDER — CEPHALEXIN 500 MG PO CAPS: 500.0000 mg | ORAL_CAPSULE | Freq: Three times a day (TID) | ORAL | Status: AC

## 2015-07-24 MED ORDER — TRAMADOL HCL 50 MG PO TABS: 50.0000 mg | ORAL_TABLET | Freq: Four times a day (QID) | ORAL | Status: DC | PRN

## 2015-07-24 NOTE — Discharge Instructions (Signed)

## 2015-07-24 NOTE — ED Notes (Signed)
States she was putting bird feed in a feeder  Slipped  Large laceration note to left forearm

## 2015-07-24 NOTE — ED Provider Notes (Signed)
CSN: [id number]     Arrival date & time 07/24/15  1457 History   First MD Initiated Contact with Patient 07/24/15 1530     Chief Complaint  Patient presents with  . Fall     (Consider location/radiation/quality/duration/timing/severity/associated sxs/prior Treatment) HPI  79 year old female presents to the emergency department with family for evaluation of left forearm laceration. Patient was riding a golf cart, stepped off, fell and is unsure what grazed her left arm. Patient had skin loss with laceration to the left forearm. Patient states pain is mild. She denies any numbness or tingling in left upper extremity. Denies any other injuries to her body. Patient is ambulatory in the ER. Patient has not had any medications for pain. Her tetanus is not up-to-date.  Past Medical History  Diagnosis Date  . History of colonic polyps   . Diabetes mellitus   . Hypertension   . Tobacco abuse   . Carotid bruit   . Hyperlipidemia   . GERD (gastroesophageal reflux disease)   . Esophagitis   . HH (hiatus hernia)   . Pneumonia   . Gastritis 2013  . Lump or mass in breast    Past Surgical History  Procedure Laterality Date  . Appendectomy    . Cholecystectomy    . Abdominal hysterectomy    . Cataract extraction  3/11    Dr Charise Killian  . Upper gi endoscopy  2013  . Colonoscopy  2009  . Breast biopsy Right 1996  . Breast biopsy Left 10/09/2012    Benign breast tissue with focal fat necrosis and focal periductal chronic inflammation.  . Breast biopsy Left 10/09/2012    Benign breast tissue with focal fat necrosis and focal periductal chronic inflammation.   Family History  Problem Relation Age of Onset  . Kidney cancer Father   . Diabetes Mother    Social History  Substance Use Topics  . Smoking status: Former Smoker    Types: Cigarettes    Start date: 02/04/2015  . Smokeless tobacco: Never Used  . Alcohol Use: No   OB History    Gravida Para Term Preterm AB TAB SAB Ectopic Multiple  Living   1 1        1       Obstetric Comments   1st Menstrual Cycle:  15 1st Pregnancy: 22     Review of Systems  Constitutional: Negative for fever, chills, activity change and fatigue.  HENT: Negative for congestion, sinus pressure and sore throat.   Eyes: Negative for visual disturbance.  Respiratory: Negative for cough, chest tightness and shortness of breath.   Cardiovascular: Negative for chest pain and leg swelling.  Gastrointestinal: Negative for nausea, vomiting, abdominal pain and diarrhea.  Genitourinary: Negative for dysuria.  Musculoskeletal: Negative for arthralgias and gait problem.  Skin: Positive for wound (left arm). Negative for rash.  Neurological: Negative for weakness, numbness and headaches.  Hematological: Negative for adenopathy.  Psychiatric/Behavioral: Negative for behavioral problems, confusion and agitation.      Allergies  Review of patient's allergies indicates no known allergies.  Home Medications   Prior to Admission medications   Medication Sig Start Date End Date Taking? Authorizing Gaberiel Youngblood  aspirin 81 MG tablet Take 81 mg by mouth daily.    Historical Fady Stamps, MD  CALCIUM-VITAMIN D PO Take 1 tablet by mouth daily.      Historical Jakayden Cancio, MD  docusate sodium (COLACE) 100 MG capsule Take 100 mg by mouth 2 (two) times daily.    Historical  Demetria Iwai, MD  hydrochlorothiazide (HYDRODIURIL) 25 MG tablet TAKE 1 TABLET DAILY 04/25/15   Abner Greenspan, MD  KLOR-CON M10 10 MEQ tablet TAKE 1 TABLET (10 MEQ TOTAL) BY MOUTH DAILY. 06/06/15   Abner Greenspan, MD  Multiple Vitamin (MULTIVITAMIN) tablet Take 1 tablet by mouth daily.      Historical Ceonna Frazzini, MD  Omega-3 Fatty Acids (FISH OIL) 1000 MG CAPS Take 1 capsule by mouth as directed.      Historical Jakota Manthei, MD  omeprazole (PRILOSEC) 20 MG capsule TAKE 1 CAPSULE TWICE DAILY 03/08/15   Abner Greenspan, MD   BP 153/68 mmHg  Pulse 106  Temp(Src) 97.5 F (36.4 C) (Oral)  Resp 20  Ht 5\' 7"  (1.702 m)  Wt  203 lb (92.08 kg)  BMI 31.79 kg/m2  SpO2 96% Physical Exam  Constitutional: She is oriented to person, place, and time. She appears well-developed and well-nourished. No distress.  HENT:  Head: Normocephalic and atraumatic.  Mouth/Throat: Oropharynx is clear and moist.  Eyes: EOM are normal. Pupils are equal, round, and reactive to light. Right eye exhibits no discharge. Left eye exhibits no discharge.  Neck: Normal range of motion. Neck supple.  Cardiovascular: Normal rate, regular rhythm and intact distal pulses.   Pulmonary/Chest: Effort normal and breath sounds normal. No respiratory distress. She exhibits no tenderness.  Abdominal: Soft. She exhibits no distension. There is no tenderness.  Musculoskeletal: Normal range of motion. She exhibits no edema.  Left upper extremity with 10 x 6 cm laceration with skin tear. Wound is along the volar/radial aspect of the left forearm. Patient has full composite fist with grip strength 5 out of 5. No sensation loss no tendon deficits. No signs of foreign body  Neurological: She is alert and oriented to person, place, and time. She has normal reflexes.  Skin: Skin is warm and dry.  Psychiatric: She has a normal mood and affect. Her behavior is normal. Thought content normal.  Nursing note and vitals reviewed.   ED Course  Procedures (including critical care time) Laceration was repaired by Dr. Lenise Arena. Sterile dressing was applied by myself.  Labs Review Labs Reviewed - No data to display  Imaging Review No results found. I have personally reviewed and evaluated these images and lab results as part of my medical decision-making.   EKG Interpretation None      MDM   Final diagnoses:  Forearm laceration, left, initial encounter  Skin tear of left forearm without complication, initial encounter    79 year old female with left forearm laceration and skin tear. Laceration was repaired with sutures. Vaseline gauze dressing was  applied. Patient will return to the ER or PCP in 8-10 days for suture removal. She was given Keflex for  prophylaxis. Tetanus was updated in the emergency department today. Patient will monitor for any redness warmth or drainage.    Duanne Guess, PA-C 07/24/15 1620   LACERATION REPAIR Performed by: Earleen Newport Authorized by: Lenise Arena E Consent: Verbal consent obtained. Risks and benefits: risks, benefits and alternatives were discussed Consent given by: patient Patient identity confirmed: provided demographic data Prepped and Draped in normal sterile fashion Wound explored, no foreign bodies were identified.  Laceration Location: Left forearm  Laceration Length: 12cm  No Foreign Bodies seen or palpated  Anesthesia: local infiltration  Local anesthetic: lidocaine 1 % with epinephrine  Anesthetic total: 6 ml  Irrigation method: syringe Amount of cleaning: standard  Skin closure: 4-0 Ethilon   Number of  sutures: 16   Technique: Running and simple interrupted   Patient tolerance: Patient tolerated the procedure well with no immediate complications.    Patient with complex left forearm laceration. There is overall good cosmetic result to the wound. This was more of a maceration of the left forearm. Parts of the wound could not be closed entirely. She is encouraged to follow-up in 2 days for a recheck. Xeroform dressings will be applied to the wound and they're encouraged to continue using topical antibiotic ointment.    Medical screening examination/treatment/procedure(s) were performed by non-physician practitioner and as supervising physician I was immediately available for consultation/collaboration.        Earleen Newport, MD 07/24/15 1640

## 2015-09-26 DIAGNOSIS — K219 Gastro-esophageal reflux disease without esophagitis: Secondary | ICD-10-CM | POA: Diagnosis not present

## 2015-09-26 DIAGNOSIS — K2270 Barrett's esophagus without dysplasia: Secondary | ICD-10-CM | POA: Diagnosis not present

## 2015-10-06 ENCOUNTER — Ambulatory Visit (INDEPENDENT_AMBULATORY_CARE_PROVIDER_SITE_OTHER): Payer: Medicare Other

## 2015-10-06 DIAGNOSIS — Z23 Encounter for immunization: Secondary | ICD-10-CM | POA: Diagnosis not present

## 2015-11-01 DIAGNOSIS — Z7982 Long term (current) use of aspirin: Secondary | ICD-10-CM | POA: Diagnosis not present

## 2015-11-01 DIAGNOSIS — K2270 Barrett's esophagus without dysplasia: Secondary | ICD-10-CM | POA: Diagnosis not present

## 2015-11-01 DIAGNOSIS — Z86010 Personal history of colonic polyps: Secondary | ICD-10-CM | POA: Diagnosis not present

## 2015-11-01 DIAGNOSIS — E785 Hyperlipidemia, unspecified: Secondary | ICD-10-CM | POA: Diagnosis not present

## 2015-11-01 DIAGNOSIS — Z79899 Other long term (current) drug therapy: Secondary | ICD-10-CM | POA: Diagnosis not present

## 2015-11-01 DIAGNOSIS — K219 Gastro-esophageal reflux disease without esophagitis: Secondary | ICD-10-CM | POA: Diagnosis not present

## 2015-11-01 DIAGNOSIS — Z87891 Personal history of nicotine dependence: Secondary | ICD-10-CM | POA: Diagnosis not present

## 2015-11-01 DIAGNOSIS — I10 Essential (primary) hypertension: Secondary | ICD-10-CM | POA: Diagnosis not present

## 2015-11-02 ENCOUNTER — Ambulatory Visit: Payer: Medicare Other | Admitting: Anesthesiology

## 2015-11-02 ENCOUNTER — Ambulatory Visit
Admission: RE | Admit: 2015-11-02 | Discharge: 2015-11-02 | Disposition: A | Discharge status: Home / Self Care | Payer: Medicare Other | Source: Ambulatory Visit | Attending: Unknown Physician Specialty | Admitting: Unknown Physician Specialty

## 2015-11-02 ENCOUNTER — Encounter: Payer: Self-pay | Admitting: *Deleted

## 2015-11-02 ENCOUNTER — Encounter
Admission: RE | Disposition: A | Discharge status: Home / Self Care | Payer: Self-pay | Source: Ambulatory Visit | Attending: Unknown Physician Specialty

## 2015-11-02 DIAGNOSIS — K219 Gastro-esophageal reflux disease without esophagitis: Secondary | ICD-10-CM | POA: Diagnosis not present

## 2015-11-02 DIAGNOSIS — Z87891 Personal history of nicotine dependence: Secondary | ICD-10-CM | POA: Insufficient documentation

## 2015-11-02 DIAGNOSIS — K2270 Barrett's esophagus without dysplasia: Secondary | ICD-10-CM | POA: Insufficient documentation

## 2015-11-02 DIAGNOSIS — Z86010 Personal history of colonic polyps: Secondary | ICD-10-CM | POA: Insufficient documentation

## 2015-11-02 DIAGNOSIS — I10 Essential (primary) hypertension: Secondary | ICD-10-CM | POA: Insufficient documentation

## 2015-11-02 DIAGNOSIS — E785 Hyperlipidemia, unspecified: Secondary | ICD-10-CM | POA: Insufficient documentation

## 2015-11-02 DIAGNOSIS — Z7982 Long term (current) use of aspirin: Secondary | ICD-10-CM | POA: Insufficient documentation

## 2015-11-02 DIAGNOSIS — Z79899 Other long term (current) drug therapy: Secondary | ICD-10-CM | POA: Insufficient documentation

## 2015-11-02 HISTORY — PX: ESOPHAGOGASTRODUODENOSCOPY (EGD) WITH PROPOFOL: SHX5813

## 2015-11-02 HISTORY — DX: Barrett's esophagus without dysplasia: K22.70

## 2015-11-02 SURGERY — ESOPHAGOGASTRODUODENOSCOPY (EGD) WITH PROPOFOL
Anesthesia: General

## 2015-11-02 MED ORDER — GLYCOPYRROLATE 0.2 MG/ML IJ SOLN
INTRAMUSCULAR | Status: DC | PRN
  Administered 2015-11-02: 0.2 mg via INTRAVENOUS

## 2015-11-02 MED ORDER — SODIUM CHLORIDE 0.9 % IV SOLN
INTRAVENOUS | Status: DC
Start: 2015-11-02 — End: 2015-11-02

## 2015-11-02 MED ORDER — PROPOFOL 10 MG/ML IV BOLUS
INTRAVENOUS | Status: DC | PRN
  Administered 2015-11-02: 60 mg via INTRAVENOUS

## 2015-11-02 MED ORDER — PROPOFOL 500 MG/50ML IV EMUL
INTRAVENOUS | Status: DC | PRN
  Administered 2015-11-02: 120 ug/kg/min via INTRAVENOUS

## 2015-11-02 MED ORDER — SODIUM CHLORIDE 0.9 % IV SOLN
INTRAVENOUS | Status: DC
Start: 2015-11-02 — End: 2015-11-02
  Administered 2015-11-02: 09:00:00 via INTRAVENOUS

## 2015-11-02 MED ORDER — LIDOCAINE HCL (CARDIAC) 20 MG/ML IV SOLN
INTRAVENOUS | Status: DC | PRN
  Administered 2015-11-02: 100 mg via INTRAVENOUS

## 2015-11-02 NOTE — Op Note (Addendum)
[facility] Gastroenterology Patient Name: Regina Harris Procedure Date: 11/02/2015 8:57 AM MRN: [id number] Account #: [id number] Date of Birth: [birth year] Admit Type: Outpatient Age: 79 Room: [facility] ENDO ROOM 4 Gender: Female Note Status: Supervisor Override Procedure:         Upper GI endoscopy Indications:       Follow-up of Barrett's esophagus Providers:         Manya Silvas, MD Referring MD:      Wynelle Fanny. Tower, MD (Referring MD) Medicines:         Propofol per Anesthesia Complications:     No immediate complications. Procedure:         Pre-Anesthesia Assessment:                    - After reviewing the risks and benefits, the patient was                     deemed in satisfactory condition to undergo the procedure.                    After obtaining informed consent, the endoscope was passed                     under direct vision. Throughout the procedure, the                     patient's blood pressure, pulse, and oxygen saturations                     were monitored continuously. The Endoscope was introduced                     through the mouth, and advanced to the second part of                     duodenum. The upper GI endoscopy was accomplished without                     difficulty. The patient tolerated the procedure well. Findings:      There were esophageal mucosal changes secondary to established       short-segment Barrett's disease present at the lower esophageal       sphincter and at the gastroesophageal junction. The maximum longitudinal       extent of these mucosal changes was 2 cm in length. Mucosa was biopsied       times three with a cold forceps for histology. One specimen bottle was       sent to pathology. GEJ at 39cm.      The stomach was normal.      The examined duodenum was normal. Impression:        - Esophageal mucosal changes secondary to established                     short-segment Barrett's disease. Biopsied.            - Normal stomach.                    - Normal examined duodenum. Recommendation:    - Await pathology results. Manya Silvas, MD 11/02/2015 9:09:24 AM This report has been signed electronically. Number of Addenda: 0 Note Initiated On: 11/02/2015 8:57 AM      [facility]

## 2015-11-02 NOTE — H&P (Signed)
Primary Care Physician:  Loura Pardon, MD Primary Gastroenterologist:  Dr. Vira Agar  Pre-Procedure History & Physical: HPI:  Regina Harris is a 79 y.o. female is here for an endoscopy.   Past Medical History  Diagnosis Date  . History of colonic polyps   . Hypertension   . Tobacco abuse   . Carotid bruit   . Hyperlipidemia   . GERD (gastroesophageal reflux disease)   . Esophagitis   . HH (hiatus hernia)   . Pneumonia   . Gastritis 2013  . Lump or mass in breast   . Barrett esophagus     Past Surgical History  Procedure Laterality Date  . Appendectomy    . Cholecystectomy    . Abdominal hysterectomy    . Cataract extraction  3/11    Dr Charise Killian  . Upper gi endoscopy  2013  . Colonoscopy  2009  . Breast biopsy Right 1996  . Breast biopsy Left 10/09/2012    Benign breast tissue with focal fat necrosis and focal periductal chronic inflammation.  . Breast biopsy Left 10/09/2012    Benign breast tissue with focal fat necrosis and focal periductal chronic inflammation.    Prior to Admission medications   Medication Sig Start Date End Date Taking? Authorizing Provider  aspirin 81 MG tablet Take 81 mg by mouth daily.   Yes Historical Provider, MD  CALCIUM-VITAMIN D PO Take 1 tablet by mouth daily.      Historical Provider, MD  docusate sodium (COLACE) 100 MG capsule Take 100 mg by mouth 2 (two) times daily.    Historical Provider, MD  hydrochlorothiazide (HYDRODIURIL) 25 MG tablet TAKE 1 TABLET DAILY 04/25/15   Abner Greenspan, MD  KLOR-CON M10 10 MEQ tablet TAKE 1 TABLET (10 MEQ TOTAL) BY MOUTH DAILY. 06/06/15   Abner Greenspan, MD  Multiple Vitamin (MULTIVITAMIN) tablet Take 1 tablet by mouth daily.      Historical Provider, MD  Omega-3 Fatty Acids (FISH OIL) 1000 MG CAPS Take 1 capsule by mouth as directed.      Historical Provider, MD  omeprazole (PRILOSEC) 20 MG capsule TAKE 1 CAPSULE TWICE DAILY 03/08/15   Abner Greenspan, MD  traMADol (ULTRAM) 50 MG tablet Take 1 tablet (50 mg total)  by mouth every 6 (six) hours as needed. 07/24/15   Duanne Guess, PA-C    Allergies as of 10/14/2015  . (No Known Allergies)    Family History  Problem Relation Age of Onset  . Kidney cancer Father   . Diabetes Mother     Social History   Social History  . Marital Status: Married    Spouse Name: N/A  . Number of Children: N/A  . Years of Education: N/A   Occupational History  . Not on file.   Social History Main Topics  . Smoking status: Former Smoker    Types: Cigarettes    Start date: 02/04/2015  . Smokeless tobacco: Never Used  . Alcohol Use: No  . Drug Use: No  . Sexual Activity: Not on file   Other Topics Concern  . Not on file   Social History Narrative    Review of Systems: See HPI, otherwise negative ROS  Physical Exam: BP 143/76 mmHg  Pulse 74  Temp(Src) 97.3 F (36.3 C) (Tympanic)  Resp 20  Ht 5\' 7"  (1.702 m)  Wt 97.07 kg (214 lb)  BMI 33.51 kg/m2  SpO2 97% General:   Alert,  pleasant and cooperative in NAD Head:  Normocephalic and atraumatic. Neck:  Supple; no masses or thyromegaly. Lungs:  Clear throughout to auscultation.    Heart:  Regular rate and rhythm. Abdomen:  Soft, nontender and nondistended. Normal bowel sounds, without guarding, and without rebound.   Neurologic:  Alert and  oriented x4;  grossly normal neurologically.  Impression/Plan: [name] is here for an endoscopy to be performed for Barrett's esophagus follow up  Risks, benefits, limitations, and alternatives regarding  endoscopy have been reviewed with the patient.  Questions have been answered.  All parties agreeable.   Gaylyn Cheers, MD  11/02/2015, 8:52 AM

## 2015-11-02 NOTE — Transfer of Care (Signed)
Immediate Anesthesia Transfer of Care Note  Patient: Regina Harris  Procedure(s) Performed: Procedure(s): ESOPHAGOGASTRODUODENOSCOPY (EGD) WITH PROPOFOL (N/A)  Patient Location: Endoscopy Unit  Anesthesia Type:General  Level of Consciousness: sedated  Airway & Oxygen Therapy: Patient Spontanous Breathing and Patient connected to nasal cannula oxygen  Post-op Assessment: Report given to RN and Post -op Vital signs reviewed and stable  Post vital signs: Reviewed and stable  Last Vitals:  Filed Vitals:   11/02/15 0834  BP: 143/76  Pulse: 74  Temp: 36.3 C  Resp: 20    Complications: No apparent anesthesia complications

## 2015-11-02 NOTE — Anesthesia Postprocedure Evaluation (Signed)
Anesthesia Post Note  Patient: Regina Harris  Procedure(s) Performed: Procedure(s) (LRB): ESOPHAGOGASTRODUODENOSCOPY (EGD) WITH PROPOFOL (N/A)  Patient location during evaluation: Endoscopy Anesthesia Type: General Level of consciousness: awake and alert Pain management: pain level controlled Vital Signs Assessment: post-procedure vital signs reviewed and stable Respiratory status: spontaneous breathing, nonlabored ventilation, respiratory function stable and patient connected to nasal cannula oxygen Cardiovascular status: blood pressure returned to baseline and stable Postop Assessment: no signs of nausea or vomiting Anesthetic complications: no    Last Vitals:  Filed Vitals:   11/02/15 0920 11/02/15 0930  BP: 127/80 144/80  Pulse: 85 70  Temp:    Resp: 17 14    Last Pain: There were no vitals filed for this visit.               Precious Haws Piscitello

## 2015-11-02 NOTE — Anesthesia Preprocedure Evaluation (Signed)
Anesthesia Evaluation  Patient identified by MRN, date of birth, ID band Patient awake    Reviewed: Allergy & Precautions, H&P , NPO status , Patient's Chart, lab work & pertinent test results  History of Anesthesia Complications Negative for: history of anesthetic complications  Airway Mallampati: III  TM Distance: >3 FB Neck ROM: limited    Dental  (+) Poor Dentition, Missing, Upper Dentures, Lower Dentures   Pulmonary neg shortness of breath, pneumonia, resolved, former smoker,    Pulmonary exam normal breath sounds clear to auscultation       Cardiovascular Exercise Tolerance: Good hypertension, (-) angina(-) Past MI and (-) DOE Normal cardiovascular exam Rhythm:regular Rate:Normal     Neuro/Psych  Neuromuscular disease negative psych ROS   GI/Hepatic Neg liver ROS, hiatal hernia, GERD  Controlled and Medicated,  Endo/Other  negative endocrine ROS  Renal/GU negative Renal ROS  negative genitourinary   Musculoskeletal   Abdominal   Peds  Hematology negative hematology ROS (+)   Anesthesia Other Findings Past Medical History:   History of colonic polyps                                    Hypertension                                                 Tobacco abuse                                                Carotid bruit                                                Hyperlipidemia                                               GERD (gastroesophageal reflux disease)                       Esophagitis                                                  HH (hiatus hernia)                                           Pneumonia                                                    Gastritis  2013         Lump or mass in breast                                       Barrett esophagus                                           Past Surgical History:   APPENDECTOMY                                                   CHOLECYSTECTOMY                                               ABDOMINAL HYSTERECTOMY                                        CATARACT EXTRACTION                              3/11           Comment:Dr Epes   UPPER GI ENDOSCOPY                               2013         COLONOSCOPY                                      2009         BREAST BIOPSY                                   Right 1996         BREAST BIOPSY                                   Left 10/09/2012     Comment:Benign breast tissue with focal fat necrosis               and focal periductal chronic inflammation.   BREAST BIOPSY                                   Left 10/09/2012     Comment:Benign breast tissue with focal fat necrosis               and focal periductal chronic inflammation.  BMI    Body Mass Index   33.50 kg/m 2      Reproductive/Obstetrics negative OB ROS  Anesthesia Physical Anesthesia Plan  ASA: III  Anesthesia Plan: General   Post-op Pain Management:    Induction:   Airway Management Planned:   Additional Equipment:   Intra-op Plan:   Post-operative Plan:   Informed Consent: I have reviewed the patients History and Physical, chart, labs and discussed the procedure including the risks, benefits and alternatives for the proposed anesthesia with the patient or authorized representative who has indicated his/her understanding and acceptance.   Dental Advisory Given  Plan Discussed with: Anesthesiologist, CRNA and Surgeon  Anesthesia Plan Comments:         Anesthesia Quick Evaluation

## 2015-11-03 LAB — SURGICAL PATHOLOGY

## 2015-11-22 ENCOUNTER — Other Ambulatory Visit: Payer: Self-pay | Admitting: Family Medicine

## 2015-12-23 ENCOUNTER — Other Ambulatory Visit: Payer: Self-pay | Admitting: Family Medicine

## 2016-02-13 DIAGNOSIS — M25511 Pain in right shoulder: Secondary | ICD-10-CM | POA: Diagnosis not present

## 2016-02-15 ENCOUNTER — Other Ambulatory Visit: Payer: Self-pay | Admitting: Orthopedic Surgery

## 2016-02-15 DIAGNOSIS — M25511 Pain in right shoulder: Secondary | ICD-10-CM

## 2016-02-22 ENCOUNTER — Ambulatory Visit
Admission: RE | Admit: 2016-02-22 | Discharge: 2016-02-22 | Disposition: A | Discharge status: Home / Self Care | Payer: Medicare Other | Source: Ambulatory Visit | Attending: Orthopedic Surgery | Admitting: Orthopedic Surgery

## 2016-02-22 DIAGNOSIS — M75111 Incomplete rotator cuff tear or rupture of right shoulder, not specified as traumatic: Secondary | ICD-10-CM | POA: Diagnosis not present

## 2016-02-22 DIAGNOSIS — M25511 Pain in right shoulder: Secondary | ICD-10-CM

## 2016-02-27 ENCOUNTER — Other Ambulatory Visit: Payer: Self-pay | Admitting: Specialist

## 2016-02-27 ENCOUNTER — Encounter
Admission: RE | Admit: 2016-02-27 | Discharge: 2016-02-27 | Disposition: A | Discharge status: Home / Self Care | Payer: Medicare Other | Source: Ambulatory Visit | Attending: Specialist | Admitting: Specialist

## 2016-02-27 DIAGNOSIS — Z01812 Encounter for preprocedural laboratory examination: Secondary | ICD-10-CM | POA: Diagnosis not present

## 2016-02-27 DIAGNOSIS — Z01810 Encounter for preprocedural cardiovascular examination: Secondary | ICD-10-CM | POA: Insufficient documentation

## 2016-02-27 DIAGNOSIS — M25511 Pain in right shoulder: Secondary | ICD-10-CM | POA: Diagnosis not present

## 2016-02-27 HISTORY — DX: Secondary polycythemia: D75.1

## 2016-02-27 HISTORY — DX: Type 2 diabetes mellitus without complications: E11.9

## 2016-02-27 LAB — CBC
HCT: 48.6 % — ABNORMAL HIGH (ref 35.0–47.0)
Hemoglobin: 16.3 g/dL — ABNORMAL HIGH (ref 12.0–16.0)
MCH: 31.7 pg (ref 26.0–34.0)
MCHC: 33.6 g/dL (ref 32.0–36.0)
MCV: 94.4 fL (ref 80.0–100.0)
PLATELETS: 178 10*3/uL (ref 150–440)
RBC: 5.15 MIL/uL (ref 3.80–5.20)
RDW: 12.5 % (ref 11.5–14.5)
WBC: 8.3 10*3/uL (ref 3.6–11.0)

## 2016-02-27 LAB — BASIC METABOLIC PANEL
Anion gap: 8 (ref 5–15)
BUN: 14 mg/dL (ref 6–20)
CALCIUM: 9.8 mg/dL (ref 8.9–10.3)
CHLORIDE: 103 mmol/L (ref 101–111)
CO2: 30 mmol/L (ref 22–32)
CREATININE: 0.88 mg/dL (ref 0.44–1.00)
GFR calc non Af Amer: >60 mL/min (ref >60)
Glucose, Bld: 124 mg/dL — ABNORMAL HIGH (ref 65–99)
Potassium: 3.1 mmol/L — ABNORMAL LOW (ref 3.5–5.1)
SODIUM: 141 mmol/L (ref 135–145)

## 2016-02-27 NOTE — Patient Instructions (Signed)
Your procedure is scheduled on: Wednesday 02/29/16 Report to Day Surgery. 2ND FLOOR MEDICAL MALL ENTRANCE To find out your arrival time please call [phone] between 1PM - 3PM on Tuesday 02/28/16.  Remember: Instructions that are not followed completely may result in serious medical risk, up to and including death, or upon the discretion of your surgeon and anesthesiologist your surgery may need to be rescheduled.    __X__ 1. Do not eat food or drink liquids after midnight. No gum chewing or hard candies.     __X__ 2. No Alcohol for 24 hours before or after surgery.   ____ 3. Bring all medications with you on the day of surgery if instructed.    __X__ 4. Notify your doctor if there is any change in your medical condition     (cold, fever, infections).     Do not wear jewelry, make-up, hairpins, clips or nail polish.  Do not wear lotions, powders, or perfumes.   Do not shave 48 hours prior to surgery. Men may shave face and neck.  Do not bring valuables to the hospital.    [facility] is not responsible for any belongings or valuables.               Contacts, dentures or bridgework may not be worn into surgery.  Leave your suitcase in the car. After surgery it may be brought to your room.  For patients admitted to the hospital, discharge time is determined by your                treatment team.   Patients discharged the day of surgery will not be allowed to drive home.   Please read over the following fact sheets that you were given:   Surgical Site Infection Prevention   __X__ Take these medicines the morning of surgery with A SIP OF WATER:    1. OMEPRAZOLE  2.   3.   4.  5.  6.  ____ Fleet Enema (as directed)   _X__ Use CHG Soap as directed  ____ Use inhalers on the day of surgery  ____ Stop metformin 2 days prior to surgery    ____ Take 1/2 of usual insulin dose the night before surgery and none on the morning of surgery.   __X__ Stop Coumadin/Plavix/aspirin on  TODAY  ____ Stop Anti-inflammatories on    __X__ Stop supplements until after surgery. STOP FISH OIL TODAY   ____ Bring C-Pap to the hospital.

## 2016-02-27 NOTE — Pre-Procedure Instructions (Signed)
MEDICAL CLEARANCE REQUEST/EKG AS INSTRUCTED BY DR Gildardo Griffes FAXED TO Marjon Doxtater AT [place].

## 2016-02-28 ENCOUNTER — Telehealth: Payer: Self-pay | Admitting: *Deleted

## 2016-02-28 ENCOUNTER — Telehealth: Payer: Self-pay | Admitting: Family Medicine

## 2016-02-28 NOTE — Pre-Procedure Instructions (Signed)
Potassium results sent to Dr. Sabra Heck for review.

## 2016-02-28 NOTE — Telephone Encounter (Signed)
I would feel more comfortable seeing her first if the surgery is not emergent Let me know

## 2016-02-28 NOTE — Telephone Encounter (Signed)
Per Dr. Glori Bickers appt scheduled tomorrow at 4:00pm, pt notified and I left a voicemail on Joni's VM letting her know

## 2016-02-28 NOTE — Telephone Encounter (Signed)
Regina Harris was not available but I did speak with the nurse and advised her of Dr. Marliss Coots comments, she will relay them to [facility] and see what her and Dr. Sabra Heck wants to do

## 2016-02-28 NOTE — Telephone Encounter (Signed)
Received call from [place] at [facility]) Dr. Delman Cheadle office. Pt has Surgery scheduled for tomorrow 02/29/16, but Dr. Sabra Heck is cancelling this and rescheduling to 03/05/16.  He received the moderate surgical risk for carotid bruit on pt by Dr. Glori Bickers. Dr. Sabra Heck is requesting pt be seen by Dr. Glori Bickers before May 1 to make sure she is clear. Please advise.  Joni call back # [phone] ext [phone]

## 2016-02-28 NOTE — Pre-Procedure Instructions (Addendum)
RECEIVED CLEARANCE FROM DR Jerilynn Mages TOWER MODERATE RISK. PRIOR SMOKER WITH CAROTID BRUIT,IF SURGERY IS NECESSARY AND EMERGENT FOR 02/29/16. NOTIFIED DR Amie Critchley WHO INSTRUCTED DR Jerilynn Mages TOWER BE CONTACTED TO SEE IF OPTIMIZATION PREFERABLE SINCE SURGERY IS SHOULDER ARTHROSCOPY WITH ROTATOR CUFF REPAIR. ALSO LEFT MESSAGE WITH Shernell Saldierna AT DR Arby Barrette.`SPOKE WITH LACHELLE AT DR TOWERS. SHE IS GOING TO DISCUSS CLEARANCE AND CALL BACK.

## 2016-02-28 NOTE — Telephone Encounter (Signed)
Cherry the anesthesiologist that is going to do pt's surgery called because she received your Surgical Clearance letter and had some questions. The anesthesiologist wasn't to comfortable doing surgery after reading your comments. She has a call placed to pt's surgeon Dr. Sabra Heck too but she wanted to know if you would feel more comfortable seeing pt for a eval of her issues/ carotid bruit, and they reschedule pt's surgery. [facility] hasn't talked to Dr. Ammie Ferrier office yet so she isn't sure if this is an emergent surgery/ how much pain pt is in but the anesthesiologist would feel more comfortable doing the surgery if you don't think she needs to f/u with you 1st, please advise   If you have time they would like a call back from Dr. Glori Bickers directly if possible (Cherry's # is [phone], if she's not available you can ask to speak to the nurse on call)

## 2016-02-28 NOTE — Telephone Encounter (Signed)
Please make her a pre op office visit (30 min) , thanks

## 2016-02-29 ENCOUNTER — Encounter: Payer: Self-pay | Admitting: Family Medicine

## 2016-02-29 ENCOUNTER — Ambulatory Visit (INDEPENDENT_AMBULATORY_CARE_PROVIDER_SITE_OTHER): Payer: Medicare Other | Admitting: Family Medicine

## 2016-02-29 VITALS — BP 122/70 | HR 66 | Temp 97.7°F | Ht 66.0 in | Wt 205.8 lb

## 2016-02-29 DIAGNOSIS — R739 Hyperglycemia, unspecified: Secondary | ICD-10-CM

## 2016-02-29 DIAGNOSIS — Z01810 Encounter for preprocedural cardiovascular examination: Secondary | ICD-10-CM | POA: Diagnosis not present

## 2016-02-29 DIAGNOSIS — D751 Secondary polycythemia: Secondary | ICD-10-CM | POA: Diagnosis not present

## 2016-02-29 DIAGNOSIS — Z01818 Encounter for other preprocedural examination: Secondary | ICD-10-CM | POA: Diagnosis not present

## 2016-02-29 DIAGNOSIS — I10 Essential (primary) hypertension: Secondary | ICD-10-CM | POA: Diagnosis not present

## 2016-02-29 DIAGNOSIS — E669 Obesity, unspecified: Secondary | ICD-10-CM

## 2016-02-29 DIAGNOSIS — E876 Hypokalemia: Secondary | ICD-10-CM

## 2016-02-29 DIAGNOSIS — R0989 Other specified symptoms and signs involving the circulatory and respiratory systems: Secondary | ICD-10-CM

## 2016-02-29 DIAGNOSIS — Z87891 Personal history of nicotine dependence: Secondary | ICD-10-CM

## 2016-02-29 NOTE — Telephone Encounter (Signed)
I will see her later today as planned and have the forms

## 2016-02-29 NOTE — Telephone Encounter (Signed)
Clearance form was faxed this morning.  In [place] Inbox

## 2016-02-29 NOTE — Patient Instructions (Addendum)
Stay on the 30 meq dose of potassium until surgery  Then drop back to 20 or whatever they tell you in the hospital  We will have to call you about getting a carotid doppler test in the am - if it is before surgery and ok - then can proceed on Monday with surgery  Stay off aspirin for now  EKG looks much better  Breathing sounds ok - great job quitting smoking

## 2016-02-29 NOTE — Telephone Encounter (Signed)
Form is on your desk with old surgical clearance form

## 2016-02-29 NOTE — Progress Notes (Signed)
Subjective:    Patient ID: Regina Harris, female    DOB: 11-18-1935, 80 y.o.   MRN: [id number]  HPI Here to discuss medical clearance for upcoming shoulder surgery with Dr Sabra Heck  R shoulder arthroscopy Complete rotator tear - after a fall (she slipped)   Surgery is planned for monday   Will be general anesthesia   Had labs at hospital Results for orders placed or performed during the hospital encounter of 0000000  Basic metabolic panel  Result Value Ref Range   Sodium 141 135 - 145 mmol/L   Potassium 3.1 (L) 3.5 - 5.1 mmol/L   Chloride 103 101 - 111 mmol/L   CO2 30 22 - 32 mmol/L   Glucose, Bld 124 (H) 65 - 99 mg/dL   BUN 14 6 - 20 mg/dL   Creatinine, Ser 0.88 0.44 - 1.00 mg/dL   Calcium 9.8 8.9 - 10.3 mg/dL   GFR calc non Af Amer >60 >60 mL/min   GFR calc Af Amer >60 >60 mL/min   Anion gap 8 5 - 15  CBC  Result Value Ref Range   WBC 8.3 3.6 - 11.0 K/uL   RBC 5.15 3.80 - 5.20 MIL/uL   Hemoglobin 16.3 (H) 12.0 - 16.0 g/dL   HCT 48.6 (H) 35.0 - 47.0 %   MCV 94.4 80.0 - 100.0 fL   MCH 31.7 26.0 - 34.0 pg   MCHC 33.6 32.0 - 36.0 g/dL   RDW 12.5 11.5 - 14.5 %   Platelets 178 150 - 440 K/uL    The ortho office called in extra 20 meq daily   Polycythemia is stable  Smoked in the past   Former smoker - last cig was about a month ago - great! Was smoking 10 cig per day for the past year  Heavier before that  02 is 94% on RA    Wt is up 3 lb with bmi of 33 Obese  bp is stable today  No cp or palpitations or headaches or edema  No side effects to medicines  BP Readings from Last 3 Encounters:  02/29/16 122/70  02/27/16 119/57  11/02/15 144/80     Hx of hyperglycemia Lab Results  Component Value Date   HGBA1C 6.1 03/16/2015    Takes asa 325 daily - last dose was Monday    Has hx of carotid bruit  EKG today is re assuring with NSR rate of 68  No medicine allergies  No med intolerances  No anaphylaxis   Never had a blood clot   Patient Active  Problem List   Diagnosis Date Noted  . Hypokalemia 03/01/2016  . Preoperative examination 02/29/2016  . Nodule of chest wall 10/06/2014  . Left breast mass 06/30/2014  . Encounter for Medicare annual wellness exam 02/22/2014  . Polycythemia, secondary 02/22/2014  . GERD (gastroesophageal reflux disease) 08/24/2013  . Lump or mass in breast 05/06/2013  . Post-menopausal 06/24/2012  . Obesity 12/24/2011  . Hyperglycemia 03/12/2011  . NEOPLASM UNSPECIFIED NATURE DIGESTIVE SYSTEM 07/24/2010  . LUNG NODULE 01/16/2010  . HYPERCHOLESTEROLEMIA, PURE 07/10/2007  . Former smoker 03/19/2007  . Essential hypertension 03/19/2007  . Carotid bruit 03/19/2007  . COLONIC POLYPS, HX OF 03/19/2007   Past Medical History  Diagnosis Date  . History of colonic polyps   . Hypertension   . Tobacco abuse   . Carotid bruit   . Hyperlipidemia   . GERD (gastroesophageal reflux disease)   . Esophagitis   . HH (hiatus  hernia)   . Pneumonia   . Gastritis 2013  . Lump or mass in breast   . Barrett esophagus   . Diabetes mellitus without complication ([place])   . Polycythemia, secondary    Past Surgical History  Procedure Laterality Date  . Appendectomy    . Cholecystectomy    . Abdominal hysterectomy    . Cataract extraction  3/11    Dr Charise Killian  . Upper gi endoscopy  2013  . Colonoscopy  2009  . Breast biopsy Right 1996  . Breast biopsy Left 10/09/2012    Benign breast tissue with focal fat necrosis and focal periductal chronic inflammation.  . Breast biopsy Left 10/09/2012    Benign breast tissue with focal fat necrosis and focal periductal chronic inflammation.  . Esophagogastroduodenoscopy (egd) with propofol N/A 11/02/2015    Procedure: ESOPHAGOGASTRODUODENOSCOPY (EGD) WITH PROPOFOL;  Surgeon: Manya Silvas, MD;  Location: [facility] ENDOSCOPY;  Service: Endoscopy;  Laterality: N/A;   Social History  Substance Use Topics  . Smoking status: Former Smoker    Types: Cigarettes    Start date:  02/04/2015    Quit date: 02/04/2015  . Smokeless tobacco: Never Used  . Alcohol Use: No   Family History  Problem Relation Age of Onset  . Kidney cancer Father   . Diabetes Mother    No Known Allergies Current Outpatient Prescriptions on File Prior to Visit  Medication Sig Dispense Refill  . aspirin 325 MG tablet Take 325 mg by mouth daily.    Marland Kitchen CALCIUM-VITAMIN D PO Take 1 tablet by mouth daily.      Marland Kitchen docusate sodium (COLACE) 100 MG capsule Take 100 mg by mouth daily as needed.     . hydrochlorothiazide (HYDRODIURIL) 25 MG tablet TAKE 1 TABLET DAILY 90 tablet 0  . KLOR-CON M10 10 MEQ tablet TAKE 1 TABLET (10 MEQ TOTAL) BY MOUTH DAILY. 30 tablet 3  . Multiple Vitamin (MULTIVITAMIN) tablet Take 1 tablet by mouth daily.      . Omega-3 Fatty Acids (FISH OIL) 1000 MG CAPS Take 1 capsule by mouth as directed.      Marland Kitchen omeprazole (PRILOSEC) 20 MG capsule TAKE 1 CAPSULE TWICE DAILY 180 capsule 3  . [DISCONTINUED] potassium chloride (K-DUR) 10 MEQ tablet Take 1 tablet (10 mEq total) by mouth daily. 30 tablet 3   No current facility-administered medications on file prior to visit.      Review of Systems Review of Systems  Constitutional: Negative for fever, appetite change,  and unexpected weight change.  Eyes: Negative for pain and visual disturbance.  Respiratory: Negative for cough and shortness of breath.   Cardiovascular: Negative for cp or palpitations    Gastrointestinal: Negative for nausea, diarrhea and constipation.  Genitourinary: Negative for urgency and frequency.  Skin: Negative for pallor or rash   MSK pos for R shoulder pain  Neurological: Negative for weakness, light-headedness, numbness and headaches.  Hematological: Negative for adenopathy. Does not bruise/bleed easily.  Psychiatric/Behavioral: Negative for dysphoric mood. The patient is not nervous/anxious.  pos for stressor of caring for her husband       Objective:   Physical Exam  Constitutional: She appears  well-developed and well-nourished. No distress.  obese and well appearing   HENT:  Head: Normocephalic and atraumatic.  Mouth/Throat: Oropharynx is clear and moist.  Eyes: Conjunctivae and EOM are normal. Pupils are equal, round, and reactive to light.  Neck: Normal range of motion. Neck supple. No JVD present. Carotid bruit is present.  No thyromegaly present.  Baseline R carotid bruit  Cardiovascular: Normal rate, regular rhythm, normal heart sounds and intact distal pulses.  Exam reveals no gallop.   Pulmonary/Chest: Effort normal and breath sounds normal. No respiratory distress. She has no wheezes. She has no rales.  Diffusely distant bs Good air exch No crackles  Abdominal: Soft. Bowel sounds are normal. She exhibits no distension, no abdominal bruit and no mass. There is no tenderness.  Musculoskeletal: She exhibits no edema.  Poor rom R shoulder  Lymphadenopathy:    She has no cervical adenopathy.  Neurological: She is alert. She has normal reflexes. No cranial nerve deficit. She exhibits normal muscle tone. Coordination normal.  Skin: Skin is warm and dry. No rash noted. No pallor.  Psychiatric: She has a normal mood and affect.  Helpful family present           Assessment & Plan:   Problem List Items Addressed This Visit      Cardiovascular and Mediastinum   Essential hypertension    bp in fair control at this time  BP Readings from Last 1 Encounters:  02/29/16 122/70   No changes needed Disc lifstyle change with low sodium diet and exercise  Labs from hospital reviewed        Other   Carotid bruit    Pt declined w/u in the past - but I feel we need to check it out before her upcoming surgery  She has to hold asa for surgery  Ref for carotid doppler      Relevant Orders   Carotid   Former smoker    Now smoke free Good air exch on exam today      Hyperglycemia    This has been controlled with diet      Hypokalemia    K of 3.1 at the  hospital Will take 30 meq of K until surgery and then cut to 20  Will re check here afterwards (it will also be checked in hospital) No symptoms       Obesity    Aware Discussed how this problem influences overall health and the risks it imposes  Reviewed plan for weight loss with lower calorie diet (via better food choices and also portion control or program like weight watchers) and exercise building up to or more than 30 minutes 5 days per week including some aerobic activity (when able to and shoulder is healed)        Polycythemia, secondary    Rev labs This has improved since quitting smoking      Preoperative examination    EKG here today is much improved (without artifact) Former smoker- air exch is good today No hx of cardiac dz Will investigate carotid bruit with doppler before surgery if possible  She has held asa for poss shoulder surg on Monday  Reassuring vitals No hx of med allergies/anaphylaxis or anesthesia problems        Other Visit Diagnoses    Pre-operative cardiovascular examination    -  Primary    Relevant Orders    EKG 12-Lead (Completed)

## 2016-02-29 NOTE — Progress Notes (Signed)
Pre visit review using our clinic review tool, if applicable. No additional management support is needed unless otherwise documented below in the visit note. 

## 2016-03-01 ENCOUNTER — Ambulatory Visit: Payer: Medicare Other

## 2016-03-01 DIAGNOSIS — R0989 Other specified symptoms and signs involving the circulatory and respiratory systems: Secondary | ICD-10-CM | POA: Diagnosis not present

## 2016-03-01 DIAGNOSIS — E876 Hypokalemia: Secondary | ICD-10-CM | POA: Insufficient documentation

## 2016-03-01 NOTE — Assessment & Plan Note (Signed)
bp in fair control at this time  BP Readings from Last 1 Encounters:  02/29/16 122/70   No changes needed Disc lifstyle change with low sodium diet and exercise  Labs from hospital reviewed

## 2016-03-01 NOTE — Assessment & Plan Note (Signed)
Aware Discussed how this problem influences overall health and the risks it imposes  Reviewed plan for weight loss with lower calorie diet (via better food choices and also portion control or program like weight watchers) and exercise building up to or more than 30 minutes 5 days per week including some aerobic activity (when able to and shoulder is healed)

## 2016-03-01 NOTE — Assessment & Plan Note (Signed)
Rev labs This has improved since quitting smoking

## 2016-03-01 NOTE — Assessment & Plan Note (Signed)
EKG here today is much improved (without artifact) Former smoker- air exch is good today No hx of cardiac dz Will investigate carotid bruit with doppler before surgery if possible  She has held asa for poss shoulder surg on Monday  Reassuring vitals No hx of med allergies/anaphylaxis or anesthesia problems

## 2016-03-01 NOTE — Assessment & Plan Note (Signed)
This has been controlled with diet

## 2016-03-01 NOTE — Assessment & Plan Note (Signed)
Pt declined w/u in the past - but I feel we need to check it out before her upcoming surgery  She has to hold asa for surgery  Ref for carotid doppler

## 2016-03-01 NOTE — Assessment & Plan Note (Signed)
Now smoke free Good air exch on exam today

## 2016-03-01 NOTE — Assessment & Plan Note (Signed)
K of 3.1 at the hospital Will take 30 meq of K until surgery and then cut to 20  Will re check here afterwards (it will also be checked in hospital) No symptoms

## 2016-03-05 ENCOUNTER — Ambulatory Visit: Payer: Medicare Other | Admitting: Anesthesiology

## 2016-03-05 ENCOUNTER — Encounter
Admission: RE | Disposition: A | Discharge status: Home / Self Care | Payer: Self-pay | Source: Ambulatory Visit | Attending: Specialist

## 2016-03-05 ENCOUNTER — Encounter: Payer: Self-pay | Admitting: *Deleted

## 2016-03-05 ENCOUNTER — Ambulatory Visit
Admission: RE | Admit: 2016-03-05 | Discharge: 2016-03-05 | Disposition: A | Discharge status: Home / Self Care | Payer: Medicare Other | Source: Ambulatory Visit | Attending: Specialist | Admitting: Specialist

## 2016-03-05 DIAGNOSIS — M75101 Unspecified rotator cuff tear or rupture of right shoulder, not specified as traumatic: Secondary | ICD-10-CM | POA: Insufficient documentation

## 2016-03-05 DIAGNOSIS — M7521 Bicipital tendinitis, right shoulder: Secondary | ICD-10-CM | POA: Insufficient documentation

## 2016-03-05 DIAGNOSIS — Z87891 Personal history of nicotine dependence: Secondary | ICD-10-CM | POA: Insufficient documentation

## 2016-03-05 DIAGNOSIS — Z7982 Long term (current) use of aspirin: Secondary | ICD-10-CM | POA: Diagnosis not present

## 2016-03-05 DIAGNOSIS — Z79899 Other long term (current) drug therapy: Secondary | ICD-10-CM | POA: Diagnosis not present

## 2016-03-05 DIAGNOSIS — J449 Chronic obstructive pulmonary disease, unspecified: Secondary | ICD-10-CM | POA: Insufficient documentation

## 2016-03-05 DIAGNOSIS — S46091D Other injury of muscle(s) and tendon(s) of the rotator cuff of right shoulder, subsequent encounter: Secondary | ICD-10-CM | POA: Diagnosis not present

## 2016-03-05 DIAGNOSIS — M7541 Impingement syndrome of right shoulder: Secondary | ICD-10-CM | POA: Diagnosis not present

## 2016-03-05 DIAGNOSIS — Z5333 Arthroscopic surgical procedure converted to open procedure: Secondary | ICD-10-CM | POA: Diagnosis not present

## 2016-03-05 DIAGNOSIS — K219 Gastro-esophageal reflux disease without esophagitis: Secondary | ICD-10-CM | POA: Diagnosis not present

## 2016-03-05 DIAGNOSIS — K449 Diaphragmatic hernia without obstruction or gangrene: Secondary | ICD-10-CM | POA: Insufficient documentation

## 2016-03-05 DIAGNOSIS — M19211 Secondary osteoarthritis, right shoulder: Secondary | ICD-10-CM | POA: Diagnosis not present

## 2016-03-05 DIAGNOSIS — E119 Type 2 diabetes mellitus without complications: Secondary | ICD-10-CM | POA: Diagnosis not present

## 2016-03-05 DIAGNOSIS — M19011 Primary osteoarthritis, right shoulder: Secondary | ICD-10-CM | POA: Diagnosis not present

## 2016-03-05 DIAGNOSIS — S43421A Sprain of right rotator cuff capsule, initial encounter: Secondary | ICD-10-CM | POA: Diagnosis not present

## 2016-03-05 DIAGNOSIS — I10 Essential (primary) hypertension: Secondary | ICD-10-CM | POA: Diagnosis not present

## 2016-03-05 HISTORY — PX: SHOULDER ARTHROSCOPY WITH OPEN ROTATOR CUFF REPAIR: SHX6092

## 2016-03-05 LAB — POCT I-STAT 4, (NA,K, GLUC, HGB,HCT)
GLUCOSE: 116 mg/dL — AB (ref 65–99)
HEMATOCRIT: 54 % — AB (ref 36.0–46.0)
HEMOGLOBIN: 18.4 g/dL — AB (ref 12.0–15.0)
POTASSIUM: 4.1 mmol/L (ref 3.5–5.1)
SODIUM: 141 mmol/L (ref 135–145)

## 2016-03-05 LAB — GLUCOSE, CAPILLARY
GLUCOSE-CAPILLARY: 104 mg/dL — AB (ref 65–99)
GLUCOSE-CAPILLARY: 138 mg/dL — AB (ref 65–99)

## 2016-03-05 SURGERY — ARTHROSCOPY, SHOULDER WITH REPAIR, ROTATOR CUFF, OPEN
Anesthesia: General | Site: Shoulder | Laterality: Right | Wound class: Clean

## 2016-03-05 MED ORDER — FENTANYL CITRATE (PF) 100 MCG/2ML IJ SOLN
INTRAMUSCULAR | Status: DC | PRN
  Administered 2016-03-05 (×4): 50 ug via INTRAVENOUS

## 2016-03-05 MED ORDER — GABAPENTIN 400 MG PO CAPS: 400.0000 mg | ORAL_CAPSULE | Freq: Three times a day (TID) | ORAL | Status: DC

## 2016-03-05 MED ORDER — BUPIVACAINE-EPINEPHRINE (PF) 0.5% -1:200000 IJ SOLN
INTRAMUSCULAR | Status: AC
  Filled 2016-03-05: qty 60

## 2016-03-05 MED ORDER — BUPIVACAINE-EPINEPHRINE (PF) 0.25% -1:200000 IJ SOLN
INTRAMUSCULAR | Status: AC
  Filled 2016-03-05: qty 30

## 2016-03-05 MED ORDER — MELOXICAM 7.5 MG PO TABS
7.5000 mg | ORAL_TABLET | Freq: Once | ORAL | Status: AC
  Administered 2016-03-05: 7.5 mg via ORAL

## 2016-03-05 MED ORDER — CEFAZOLIN SODIUM-DEXTROSE 2-4 GM/100ML-% IV SOLN
2.0000 g | INTRAVENOUS | Status: AC
  Administered 2016-03-05: 2 g via INTRAVENOUS

## 2016-03-05 MED ORDER — PROPOFOL 10 MG/ML IV BOLUS
INTRAVENOUS | Status: DC | PRN
  Administered 2016-03-05: 160 mg via INTRAVENOUS

## 2016-03-05 MED ORDER — NEOMYCIN-POLYMYXIN B GU 40-200000 IR SOLN
Status: DC | PRN
  Administered 2016-03-05: 2 mL

## 2016-03-05 MED ORDER — CLINDAMYCIN PHOSPHATE 600 MG/50ML IV SOLN
INTRAVENOUS | Status: AC
  Filled 2016-03-05: qty 50

## 2016-03-05 MED ORDER — MIDAZOLAM HCL 2 MG/2ML IJ SOLN
INTRAMUSCULAR | Status: DC | PRN
Start: 2016-03-05 — End: 2016-03-05
  Administered 2016-03-05: 2 mg via INTRAVENOUS

## 2016-03-05 MED ORDER — CLINDAMYCIN PHOSPHATE 600 MG/50ML IV SOLN
600.0000 mg | Freq: Once | INTRAVENOUS | Status: AC
  Administered 2016-03-05: 600 mg via INTRAVENOUS

## 2016-03-05 MED ORDER — MORPHINE SULFATE (PF) 4 MG/ML IV SOLN
INTRAVENOUS | Status: AC
  Filled 2016-03-05: qty 1

## 2016-03-05 MED ORDER — SUCCINYLCHOLINE CHLORIDE 20 MG/ML IJ SOLN
INTRAMUSCULAR | Status: DC | PRN
  Administered 2016-03-05: 100 mg via INTRAVENOUS

## 2016-03-05 MED ORDER — BUPIVACAINE-EPINEPHRINE 0.5% -1:200000 IJ SOLN
INTRAMUSCULAR | Status: DC | PRN
  Administered 2016-03-05: 30 mL

## 2016-03-05 MED ORDER — MELOXICAM 7.5 MG PO TABS
ORAL_TABLET | ORAL | Status: AC
  Filled 2016-03-05: qty 1

## 2016-03-05 MED ORDER — FENTANYL CITRATE (PF) 100 MCG/2ML IJ SOLN: 25.0000 ug | INTRAMUSCULAR | Status: DC | PRN

## 2016-03-05 MED ORDER — SUGAMMADEX SODIUM 200 MG/2ML IV SOLN
INTRAVENOUS | Status: DC | PRN
  Administered 2016-03-05: 200 mg via INTRAVENOUS

## 2016-03-05 MED ORDER — EPHEDRINE SULFATE 50 MG/ML IJ SOLN
INTRAMUSCULAR | Status: DC | PRN
  Administered 2016-03-05: 5 mg via INTRAVENOUS

## 2016-03-05 MED ORDER — GABAPENTIN 400 MG PO CAPS
400.0000 mg | ORAL_CAPSULE | Freq: Once | ORAL | Status: AC
  Administered 2016-03-05: 400 mg via ORAL

## 2016-03-05 MED ORDER — ROCURONIUM BROMIDE 100 MG/10ML IV SOLN
INTRAVENOUS | Status: DC | PRN
  Administered 2016-03-05: 30 mg via INTRAVENOUS

## 2016-03-05 MED ORDER — EPINEPHRINE HCL 1 MG/ML IJ SOLN
INTRAMUSCULAR | Status: AC
  Filled 2016-03-05: qty 1

## 2016-03-05 MED ORDER — HYDROCODONE-ACETAMINOPHEN 5-325 MG PO TABS: 1.0000 | ORAL_TABLET | Freq: Four times a day (QID) | ORAL | Status: DC | PRN

## 2016-03-05 MED ORDER — CEFAZOLIN SODIUM-DEXTROSE 2-4 GM/100ML-% IV SOLN
INTRAVENOUS | Status: AC
  Filled 2016-03-05: qty 100

## 2016-03-05 MED ORDER — ONDANSETRON HCL 4 MG/2ML IJ SOLN: 4.0000 mg | Freq: Once | INTRAMUSCULAR | Status: DC | PRN

## 2016-03-05 MED ORDER — SODIUM CHLORIDE 0.9 % IV SOLN
INTRAVENOUS | Status: DC
  Administered 2016-03-05 (×2): via INTRAVENOUS

## 2016-03-05 MED ORDER — GABAPENTIN 400 MG PO CAPS
ORAL_CAPSULE | ORAL | Status: AC
  Filled 2016-03-05: qty 1

## 2016-03-05 MED ORDER — LIDOCAINE HCL (CARDIAC) 20 MG/ML IV SOLN
INTRAVENOUS | Status: DC | PRN
  Administered 2016-03-05: 100 mg via INTRAVENOUS

## 2016-03-05 MED ORDER — CHLORHEXIDINE GLUCONATE 4 % EX LIQD: 1.0000 "application " | Freq: Once | CUTANEOUS | Status: DC

## 2016-03-05 MED ORDER — EPINEPHRINE HCL 1 MG/ML IJ SOLN
INTRAMUSCULAR | Status: DC | PRN
  Administered 2016-03-05 (×4): 1 mg

## 2016-03-05 MED ORDER — ONDANSETRON HCL 4 MG/2ML IJ SOLN
INTRAMUSCULAR | Status: DC | PRN
  Administered 2016-03-05: 4 mg via INTRAVENOUS

## 2016-03-05 MED ORDER — DEXAMETHASONE SODIUM PHOSPHATE 10 MG/ML IJ SOLN
INTRAMUSCULAR | Status: DC | PRN
  Administered 2016-03-05: 5 mg via INTRAVENOUS

## 2016-03-05 MED ORDER — MORPHINE SULFATE (PF) 4 MG/ML IV SOLN
INTRAVENOUS | Status: DC | PRN
  Administered 2016-03-05: 4 mg

## 2016-03-05 SURGICAL SUPPLY — 67 items
ADAPTER IRRIG TUBE 2 SPIKE SOL (ADAPTER) ×3 IMPLANT
ADPR TBG 2 SPK PMP STRL ASCP (ADAPTER) ×1
BLADE AGGRESSIVE PLUS 4.0 (BLADE) ×1 IMPLANT
BLADE INCISOR PLUS 4.5 (BLADE) ×2 IMPLANT
BLADE SURG SZ11 CARB STEEL (BLADE) ×3 IMPLANT
BUR AGGRESSIVE+ 5.5 (BURR) ×1 IMPLANT
BUR BR 5.5 12 FLUTE (BURR) ×1 IMPLANT
BUR BR 5.5 WIDE MOUTH (BURR) ×2 IMPLANT
BUR RADIUS 4.0X18.5 (BURR) ×1 IMPLANT
BUR RADIUS 5.5 (BURR) ×1 IMPLANT
CANNULA 5.75X7 CRYSTAL CLEAR (CANNULA) ×1 IMPLANT
CANNULA 8.5X75 THRED (CANNULA) ×1 IMPLANT
CANNULA PARTIAL THREAD 2X7 (CANNULA) ×1 IMPLANT
CHLORAPREP W/TINT 26ML (MISCELLANEOUS) ×3 IMPLANT
CONNECTOR PERFECT PASSER (CONNECTOR) ×1 IMPLANT
COVER MAYO STAND STRL (DRAPES) ×3 IMPLANT
DRAPE IMP U-DRAPE 54X76 (DRAPES) ×3 IMPLANT
DRAPE SHEET LG 3/4 BI-LAMINATE (DRAPES) ×3 IMPLANT
DRAPE STERI 35X30 U-POUCH (DRAPES) ×3 IMPLANT
GAUZE PETRO XEROFOAM 1X8 (MISCELLANEOUS) ×3 IMPLANT
GAUZE SPONGE 4X4 12PLY STRL (GAUZE/BANDAGES/DRESSINGS) ×3 IMPLANT
GLOVE SURG ORTHO 8.0 STRL STRW (GLOVE) ×3 IMPLANT
GOWN STRL REUS W/ TWL LRG LVL4 (GOWN DISPOSABLE) ×1 IMPLANT
GOWN STRL REUS W/TWL LRG LVL4 (GOWN DISPOSABLE) ×3
IV LACTATED RINGER IRRG 3000ML (IV SOLUTION) ×18
IV LR IRRIG 3000ML ARTHROMATIC (IV SOLUTION) ×1 IMPLANT
KIT RM TURNOVER STRD PROC AR (KITS) ×3 IMPLANT
KIT SHOULDER TRACTION (DRAPES) ×3 IMPLANT
MANIFOLD NEPTUNE II (INSTRUMENTS) ×3 IMPLANT
MAT BLUE FLOOR 46X72 FLO (MISCELLANEOUS) ×3 IMPLANT
MULTI S KNOTLES FIXATION 5.5 (Orthopedic Implant) ×6 IMPLANT
NDL MAYO CATGUT SZ5 (NEEDLE)
NDL SAFETY 18GX1.5 (NEEDLE) ×3 IMPLANT
NDL SPNL 18GX3.5 QUINCKE PK (NEEDLE) ×1 IMPLANT
NDL SUT 5 .5 CRC TPR PNT MAYO (NEEDLE) ×1 IMPLANT
NEEDLE SPNL 18GX3.5 QUINCKE PK (NEEDLE) ×3 IMPLANT
NS IRRIG 500ML POUR BTL (IV SOLUTION) ×3 IMPLANT
PACK ARTHROSCOPY SHOULDER (MISCELLANEOUS) ×3 IMPLANT
PASSER SUT CAPTURE FIRST (SUTURE) ×3 IMPLANT
SET TUBE SUCT SHAVER OUTFL 24K (TUBING) ×3 IMPLANT
SET TUBE TIP INTRA-ARTICULAR (MISCELLANEOUS) ×3 IMPLANT
SLING ULTRA II LG (MISCELLANEOUS) ×3 IMPLANT
SOL PREP PVP 2OZ (MISCELLANEOUS) ×3
SOLUTION PREP PVP 2OZ (MISCELLANEOUS) ×1 IMPLANT
SUT ETH BLK MONO 3 0 FS 1 12/B (SUTURE) ×1 IMPLANT
SUT ETHIBOND 2-0  6-8 CP-2 (SUTURE)
SUT ETHIBOND 2-0 6-8 CP-2 (SUTURE) ×1 IMPLANT
SUT ETHILON 3 0 FSLX (SUTURE) ×3 IMPLANT
SUT ORTHOCORD W/MULTIPK NDL (SUTURE) ×1 IMPLANT
SUT PDS PLUS 0 (SUTURE)
SUT PDS PLUS AB 0 CT-2 (SUTURE) ×1 IMPLANT
SUT PERFECTPASSER WHITE CART (SUTURE) ×1 IMPLANT
SUT SMART STITCH CARTRIDGE (SUTURE) ×1 IMPLANT
SUT VIC AB 2-0 CT2 27 (SUTURE) ×5 IMPLANT
SUT VIC AB 4-0 SH 27 (SUTURE)
SUT VIC AB 4-0 SH 27XANBCTRL (SUTURE) ×1 IMPLANT
SUT VICRYL 3-0 27IN (SUTURE) ×1 IMPLANT
SUTURE MAGNUM WIRE 2X48 BLK (SUTURE) ×9 IMPLANT
SYR 20CC LL (SYRINGE) ×3 IMPLANT
SYR 30ML LL (SYRINGE) ×3 IMPLANT
SYR 50ML LL SCALE MARK (SYRINGE) ×3 IMPLANT
TAPE TRANSPORE STRL 2 31045 (GAUZE/BANDAGES/DRESSINGS) ×2 IMPLANT
TUBING ARTHRO INFLOW-ONLY STRL (TUBING) ×3 IMPLANT
TUBING CONNECTING 10 (TUBING) ×2 IMPLANT
TUBING CONNECTING 10' (TUBING) ×1
WAND HAND CNTRL MULTIVAC 90 (MISCELLANEOUS) ×3 IMPLANT
WIRE MAGNUM (SUTURE) ×2 IMPLANT

## 2016-03-05 NOTE — Anesthesia Procedure Notes (Signed)
Procedure Name: Intubation Date/Time: 03/05/2016 12:52 PM Performed by: Aline Brochure Pre-anesthesia Checklist: Patient identified, Emergency Drugs available, Suction available and Patient being monitored Patient Re-evaluated:Patient Re-evaluated prior to inductionOxygen Delivery Method: Circle system utilized Preoxygenation: Pre-oxygenation with 100% oxygen Intubation Type: IV induction Ventilation: Mask ventilation without difficulty Laryngoscope Size: Mac and 3 Grade View: Grade I Tube type: Oral Tube size: 7.0 mm Number of attempts: 1 Airway Equipment and Method: Stylet Placement Confirmation: ETT inserted through vocal cords under direct vision,  positive ETCO2 and breath sounds checked- equal and bilateral Secured at: 21 cm Tube secured with: Tape Dental Injury: Teeth and Oropharynx as per pre-operative assessment

## 2016-03-05 NOTE — Op Note (Signed)
03/05/2016  3:30 PM  PATIENT:  Regina Harris    PRE-OPERATIVE DIAGNOSIS:  Large right rotator cuff tear, AC joint arthritis, severe impingement syndrome, and severe fraying of the biceps tendon  POST-OPERATIVE DIAGNOSIS:  Same  PROCEDURE:  SHOULDER ARTHROSCOPY WITH OPEN ROTATOR CUFF REPAIR, arthroscopic subacromial decompression and distal clavicle excision, biceps tenotomy  SURGEON:  Nelwyn Hebdon E, MD   .  ANESTHESIA:   General plus interscalene block  PREOPERATIVE INDICATIONS:  Regina Harris is a  80 y.o. female with a diagnosis of S46.091A Inj musc/tend the rotator cuff of right shoulder, init who failed conservative measures and elected for surgical management.    The risks benefits and alternatives were discussed with the patient preoperatively including but not limited to the risks of infection, bleeding, nerve injury, cardiopulmonary complications, the need for revision surgery, among others, and the patient was willing to proceed.  OPERATIVE IMPLANTS: 2 Smith and nephew multi-Fix anchors  OPERATIVE FINDINGS: The patient had severe subacromial bursitis and impingement. The anterior acromion was prominent. The ACjoint was arthritic. The glenohumeral joint was intact.  Biceps tendon was badly frayed. There was a large tear of the rotator cuff anteriorly which was quite irregular. The labrum was mildly frayed.   OPERATIVE PROCEDURE: The patient was brought to the operating room where satisfactory general endotracheal and interscalene anesthesia were accomplished.The patient was turned into the lateral decubitus position and the shoulder was prepped and draped in a sterile fashion. Arthroscopy was carried out from a posterior portal with accessory portals laterally and anteriorly. The  joint was examined first. The above findings were encountered. The motorized shaver was introduced anteriorly and the undersurface of the rotator cuff probed and lightly debrided. The biceps tendon was  badly frayed..  The ArthroCare wand was introduced and the biceps tendon was released completely. The labrum was trimmed up with the ArthroCare wand as well. The arthroscope was redirected into the subacromial space. There was severe bursitis which was resected with the motorized shaver and ArthroCare wand. The large bur was introduced from a posterior portal and the anterior acromion was debrided. The undersurface of the clavicle was debrided with the bur which was then reintroduced from an anterior portal and the remaining distal clavicle completely excised. The rotator cuff tear was seen to be quite complex with multiple layers of the tendon torn. When quite far anteriorly in front of the bicipital groove. I felt that this would be terribly difficult to do arthroscopically so elected to make a mini open incision. This was carried out entering anterolaterally and dissection carried out by someplace tissue. The deltoid fascia was divided just at the anterior corner of the acromion and muscle fibers spread apart. This was directly over the tear area. There was still a grade 2 of bursal tissue which was removed. Multiple fragments of the rotator cuff were debrided as there were multiple layers of tearing. Numerous torn from in front of the bicipital groove back to the infraspinous. Was also retracted about 2 cm. The tuberosity was debrided and burred to create a layer of bleeding bone. 4 magnum wire sutures were placed into the cuff from posterior to anterior and 2 holes for multi-fix anchors were created anteriorly and posteriorly. The 2 anterior to posterior sutures were placed into the anchors and anchors were then driven into the bone and seated fully. This closed the gap and open area completely. The biceps tendon was sutured to the cuff with multiple sutures. The wound was irrigated. There  was complete closure of the cuff and good range of motion  The joint was flushed and the  deep fascia closed with 0 Vicryl  sutures. Subcutaneous tissue was closed with 2-0 Vicryl. Skin wounds were closed with staples. Sponge and needle counts were correct.   The dry sterile dressing and was applied along with a padded sling and TENS pads. Patient was awakened and taken recovery in good condition.   Basil Dess.D.

## 2016-03-05 NOTE — H&P (Signed)
THE PATIENT WAS SEEN PRIOR TO SURGERY TODAY.  HISTORY, ALLERGIES, HOME MEDICATIONS AND OPERATIVE PROCEDURE WERE REVIEWED. RISKS AND BENEFITS OF SURGERY DISCUSSED WITH PATIENT AGAIN.  NO CHANGES FROM INITIAL HISTORY AND PHYSICAL NOTED.    

## 2016-03-05 NOTE — Transfer of Care (Signed)
Immediate Anesthesia Transfer of Care Note  Patient: Regina Harris  Procedure(s) Performed: Procedure(s): SHOULDER ARTHROSCOPY WITH OPEN ROTATOR CUFF REPAIR (Right)  Patient Location: PACU  Anesthesia Type:General  Level of Consciousness: awake  Airway & Oxygen Therapy: Patient Spontanous Breathing and Patient connected to face mask oxygen  Post-op Assessment: Post -op Vital signs reviewed and stable  Post vital signs: stable  Last Vitals:  Filed Vitals:   03/05/16 1050 03/05/16 1547  BP: 142/63 166/81  Pulse: 73 89  Temp: 36.6 C 36.1 C  Resp: 16 13    Last Pain:  Filed Vitals:   03/05/16 1547  PainSc: 3          Complications: No apparent anesthesia complications

## 2016-03-05 NOTE — Discharge Instructions (Signed)

## 2016-03-05 NOTE — Anesthesia Preprocedure Evaluation (Signed)
Anesthesia Evaluation  Patient identified by MRN, date of birth, ID band Patient awake    Reviewed: Allergy & Precautions, NPO status , Patient's Chart, lab work & pertinent test results  Airway Mallampati: III       Dental  (+) Upper Dentures, Lower Dentures   Pulmonary shortness of breath and with exertion, COPD, former smoker,     + decreased breath sounds      Cardiovascular hypertension,  Rhythm:Regular     Neuro/Psych    GI/Hepatic Neg liver ROS, hiatal hernia, GERD  ,  Endo/Other  diabetes, Type 2  Renal/GU      Musculoskeletal   Abdominal   Peds  Hematology   Anesthesia Other Findings   Reproductive/Obstetrics                             Anesthesia Physical Anesthesia Plan  ASA: III  Anesthesia Plan: General   Post-op Pain Management:    Induction: Intravenous  Airway Management Planned: Oral ETT  Additional Equipment:   Intra-op Plan:   Post-operative Plan: Extubation in OR  Informed Consent: I have reviewed the patients History and Physical, chart, labs and discussed the procedure including the risks, benefits and alternatives for the proposed anesthesia with the patient or authorized representative who has indicated his/her understanding and acceptance.     Plan Discussed with:   Anesthesia Plan Comments:         Anesthesia Quick Evaluation

## 2016-03-06 ENCOUNTER — Encounter: Payer: Self-pay | Admitting: Specialist

## 2016-03-06 NOTE — Anesthesia Postprocedure Evaluation (Signed)
Anesthesia Post Note  Patient: Regina Harris  Procedure(s) Performed: Procedure(s) (LRB): SHOULDER ARTHROSCOPY WITH OPEN ROTATOR CUFF REPAIR (Right)  Patient location during evaluation: PACU Anesthesia Type: General Level of consciousness: awake Pain management: pain level controlled Vital Signs Assessment: post-procedure vital signs reviewed and stable Respiratory status: nonlabored ventilation Cardiovascular status: stable Anesthetic complications: no    Last Vitals:  Filed Vitals:   03/05/16 1639 03/05/16 1712  BP: 161/89 148/67  Pulse:  84  Temp:    Resp:  16    Last Pain:  Filed Vitals:   03/05/16 1713  PainSc: 0-No pain                 VAN STAVEREN,Primitivo Merkey

## 2016-03-07 ENCOUNTER — Other Ambulatory Visit: Payer: Self-pay | Admitting: Family Medicine

## 2016-03-07 MED ORDER — OMEPRAZOLE 20 MG PO CPDR: 20.0000 mg | DELAYED_RELEASE_CAPSULE | Freq: Two times a day (BID) | ORAL | Status: DC

## 2016-03-07 NOTE — Addendum Note (Signed)
Addended by: Tammi Sou on: 03/07/2016 03:10 PM   Modules accepted: Orders

## 2016-03-09 ENCOUNTER — Telehealth: Payer: Self-pay

## 2016-03-09 ENCOUNTER — Encounter: Payer: Self-pay | Admitting: Specialist

## 2016-03-09 DIAGNOSIS — R531 Weakness: Secondary | ICD-10-CM | POA: Insufficient documentation

## 2016-03-09 DIAGNOSIS — M75101 Unspecified rotator cuff tear or rupture of right shoulder, not specified as traumatic: Secondary | ICD-10-CM

## 2016-03-09 NOTE — Telephone Encounter (Signed)
Regina Harris pts granddaughter (DPR signed) left v/m requesting and order to [facility] for home health aide couple of days per week; pt had rotator cuff surgery on 03/05/16. Regina Harris request cb.pt last seen 02/29/16.

## 2016-03-09 NOTE — Telephone Encounter (Signed)
Ref done  Will route to [facility]

## 2016-03-09 NOTE — Telephone Encounter (Signed)
Granddaughter said there is no one coming to the home to do any PT with pt. The Ortho doc gave her home stretches/exercises to do and then she will have PT in the Ortho office in 6 weeks. Granddaughter said that her and her mother both have to go back to work next week so the main ADL pt is going to need help with is bathing and dressing. Pt's family does her med box for her so the aide wouldn't have to do that.  Pt's husband has an aide named Solmon Ice that is through [facility] and she advised them that if we put the order in to advance that she could just be the aide for pt and pt's spouse.

## 2016-03-09 NOTE — Telephone Encounter (Signed)
Is she already getting PT from them?   What activities of daily living does she need help with and for what reasons (anything besides the lack of mobility of the arm?)  Thanks - I will get on it

## 2016-03-12 ENCOUNTER — Telehealth: Payer: Self-pay | Admitting: Family Medicine

## 2016-03-12 ENCOUNTER — Telehealth: Payer: Self-pay

## 2016-03-12 NOTE — Telephone Encounter (Signed)
V/M left that pt is getting close to be out of Klor Con. Wants to know if pt should go back to taking Klor con 10 meq daily;Per v/m prior to surgery pt was increased to Klor con 20 meq bid. Wants to know if needs to have K lab test done or what dosage does Dr Glori Bickers want pt on. Request cb. [facility].  The 02/29/16 office note had pt taking 30 meq of K prior to surgery and then cut back to 20 meq. Please advise.

## 2016-03-12 NOTE — Telephone Encounter (Signed)
Granddaughter notified of Dr. Marliss Coots comments and verbalized understanding, lab appt scheduled for tomorrow

## 2016-03-12 NOTE — Telephone Encounter (Signed)
Error

## 2016-03-12 NOTE — Telephone Encounter (Signed)
Let's get a level Please order bmp for hypokalemia  Thanks  Non fasting

## 2016-03-13 ENCOUNTER — Other Ambulatory Visit (INDEPENDENT_AMBULATORY_CARE_PROVIDER_SITE_OTHER): Payer: Medicare Other

## 2016-03-13 ENCOUNTER — Encounter: Payer: Self-pay | Admitting: Family Medicine

## 2016-03-13 DIAGNOSIS — E876 Hypokalemia: Secondary | ICD-10-CM

## 2016-03-13 LAB — BASIC METABOLIC PANEL
BUN: 21 mg/dL (ref 6–23)
CALCIUM: 10.1 mg/dL (ref 8.4–10.5)
CHLORIDE: 102 meq/L (ref 96–112)
CO2: 32 meq/L (ref 19–32)
CREATININE: 0.78 mg/dL (ref 0.40–1.20)
GFR: 75.9 mL/min (ref >60.00)
GLUCOSE: 120 mg/dL — AB (ref 70–99)
Potassium: 3.9 mEq/L (ref 3.5–5.1)
Sodium: 142 mEq/L (ref 135–145)

## 2016-03-14 ENCOUNTER — Telehealth: Payer: Self-pay | Admitting: *Deleted

## 2016-03-14 MED ORDER — POTASSIUM CHLORIDE CRYS ER 20 MEQ PO TBCR: 20.0000 meq | EXTENDED_RELEASE_TABLET | Freq: Two times a day (BID) | ORAL | Status: DC

## 2016-03-14 NOTE — Telephone Encounter (Signed)
-----   Message from Abner Greenspan, MD sent at 03/14/2016  1:31 PM EDT ----- Stick with bid then -please send in refills for a year to her pharmacy

## 2016-03-14 NOTE — Telephone Encounter (Signed)
done

## 2016-03-15 NOTE — Telephone Encounter (Signed)
[name] called [facility] at [facility]; spoke with laurie at [facility] and Medication phoned toLaurie at [facility] as instructed. rx will be ready for pick up in 1 hr. [name] voiced understanding.

## 2016-03-16 DIAGNOSIS — M19011 Primary osteoarthritis, right shoulder: Secondary | ICD-10-CM | POA: Diagnosis not present

## 2016-03-16 DIAGNOSIS — M7541 Impingement syndrome of right shoulder: Secondary | ICD-10-CM | POA: Diagnosis not present

## 2016-03-16 DIAGNOSIS — S43421D Sprain of right rotator cuff capsule, subsequent encounter: Secondary | ICD-10-CM | POA: Diagnosis not present

## 2016-03-20 ENCOUNTER — Other Ambulatory Visit: Payer: Self-pay | Admitting: Family Medicine

## 2016-03-22 ENCOUNTER — Telehealth: Payer: Self-pay | Admitting: Family Medicine

## 2016-03-22 NOTE — Telephone Encounter (Signed)
Patient Name: Regina Harris  DOB: 1936/07/20    Initial Comment grandmother has fluid in both of her feet for, been keeping them elevated   Nurse Assessment  Nurse: Raphael Gibney, RN, Vanita Ingles Date/Time (Eastern Time): 03/22/2016 9:29:02 AM  Confirm and document reason for call. If symptomatic, describe symptoms. You must click the next button to save text entered. ---Caller states her grandmother has a little swelling in tops and sides of both feet. Grandmother did not want her to call. Edema is trace to 1+. Elevated her feet yesterday. she takes HCTZ. No SOB. She wants to know if Dr. Glori Bickers wants to increase her HCTZ; wants to see her in the office and at what point she needs to see the pt. She is not with the pt.  Has the patient traveled out of the country within the last 30 days? ---Not Applicable  Does the patient have any new or worsening symptoms? ---Yes  Will a triage be completed? ---No  Select reason for no triage. ---Other  Please document clinical information provided and list any resource used. ---Advised caller as per nursing knowledge that pt can call herself for further assessment, and note would be sent to Dr. Glori Bickers regarding the edema in the tops and sides of both feet. No SOB. Please call granddaughter back regarding whether Dr. Glori Bickers wants to see the pt, increase her HCTZ and at which point she needs to see the doctor. Verbalized understanding.     Guidelines    Guideline Title Affirmed Question Affirmed Notes       Final Disposition User   Clinical Call [place], RN, Vanita Ingles

## 2016-03-22 NOTE — Telephone Encounter (Signed)
Give her one extra hctz today now  Then go back to prev dose Watch sodium and processed foods  Push water intake  Elevate feet Try to keep out of the heat  Support stockings may also help  Alert me if symptoms worsen or do not improve thanks

## 2016-03-22 NOTE — Telephone Encounter (Signed)
Granddaughter notified of Dr. Marliss Coots comments and verbalized understanding

## 2016-03-27 DIAGNOSIS — Z961 Presence of intraocular lens: Secondary | ICD-10-CM | POA: Diagnosis not present

## 2016-04-04 NOTE — Telephone Encounter (Signed)
Left voicemail requesting [name] to call office and schedule a f/u for pt

## 2016-04-04 NOTE — Telephone Encounter (Signed)
Emergency Contact [name] Fogleman left message on triage phone, wanted to update PCP on patient.  Pt is not doing any better, still having fluid on both legs, ankles and feet.  The skin "feels really tight".  Please advise.  Best number to call Hansel Starling is [phone]

## 2016-04-04 NOTE — Telephone Encounter (Signed)
Please f/u for a visit when able Thanks

## 2016-04-05 ENCOUNTER — Ambulatory Visit (INDEPENDENT_AMBULATORY_CARE_PROVIDER_SITE_OTHER): Payer: Medicare Other | Admitting: Family Medicine

## 2016-04-05 ENCOUNTER — Encounter: Payer: Self-pay | Admitting: Family Medicine

## 2016-04-05 VITALS — BP 122/74 | HR 82 | Temp 97.8°F | Wt 203.0 lb

## 2016-04-05 DIAGNOSIS — R600 Localized edema: Secondary | ICD-10-CM | POA: Insufficient documentation

## 2016-04-05 LAB — COMPREHENSIVE METABOLIC PANEL
ALBUMIN: 4 g/dL (ref 3.5–5.2)
ALT: 12 U/L (ref 0–35)
AST: 16 U/L (ref 0–37)
Alkaline Phosphatase: 64 U/L (ref 39–117)
BUN: 16 mg/dL (ref 6–23)
CHLORIDE: 100 meq/L (ref 96–112)
CO2: 31 mEq/L (ref 19–32)
CREATININE: 0.79 mg/dL (ref 0.40–1.20)
Calcium: 10.1 mg/dL (ref 8.4–10.5)
GFR: 74.78 mL/min (ref >60.00)
GLUCOSE: 105 mg/dL — AB (ref 70–99)
Potassium: 3.6 mEq/L (ref 3.5–5.1)
SODIUM: 138 meq/L (ref 135–145)
TOTAL PROTEIN: 7.3 g/dL (ref 6.0–8.3)
Total Bilirubin: 0.4 mg/dL (ref 0.2–1.2)

## 2016-04-05 LAB — POC URINALSYSI DIPSTICK (AUTOMATED)
BILIRUBIN UA: NEGATIVE
GLUCOSE UA: NEGATIVE
Ketones, UA: NEGATIVE
LEUKOCYTES UA: NEGATIVE
NITRITE UA: NEGATIVE
Protein, UA: NEGATIVE
RBC UA: NEGATIVE
Spec Grav, UA: 1.015
Urobilinogen, UA: 0.2
pH, UA: 6.5

## 2016-04-05 LAB — BRAIN NATRIURETIC PEPTIDE: Pro B Natriuretic peptide (BNP): 41 pg/mL (ref 0.0–100.0)

## 2016-04-05 LAB — TSH: TSH: 0.08 u[IU]/mL — AB (ref 0.35–4.50)

## 2016-04-05 NOTE — Assessment & Plan Note (Signed)
Unclear etiology of newly worsening dependent edema but not consistent with cardiac or hepatic cause. ?worsening from more sedentary lifestyle after recent shoulder surgery.  Check labs to further evaluate (CMP, BNP, TSH, urinalysis).  Discussed continued supportive care in form of elevation, low sodium diet, increased water and start wearing compression stockings (she has a pair at home). Encouraged good protein intake.  Pending labs will consider short lasix course.

## 2016-04-05 NOTE — Telephone Encounter (Signed)
Pt scheduled an appt with Dr. Darnell Level today at 1:00pm

## 2016-04-05 NOTE — Addendum Note (Signed)
Addended by: Royann Shivers A on: 04/05/2016 02:18 PM   Modules accepted: Orders

## 2016-04-05 NOTE — Progress Notes (Signed)
Pre visit review using our clinic review tool, if applicable. No additional management support is needed unless otherwise documented below in the visit note. 

## 2016-04-05 NOTE — Patient Instructions (Addendum)
Use compression stockings at home. Elevate legs as much as possible.  Continue to avoid salt and drink plenty of water. Blood work today - we may start stronger water pill for short course.   Call grand daughter [name] Cope) [phone] with results

## 2016-04-05 NOTE — Progress Notes (Signed)
BP 122/74 mmHg  Pulse 82  Temp(Src) 97.8 F (36.6 C) (Oral)  Wt 203 lb (92.08 kg)  SpO2 96% RA  CC: check legs  Subjective:    Patient ID: Regina Harris, female    DOB: October 22, 1936, 80 y.o.   MRN: [id number]  HPI: Regina Harris is a 80 y.o. female presenting on 04/05/2016 for Leg Swelling   Presents with grand daughter today.   See recent phone note for details - noticing swelling of legs/feet with skin tightness over last 2 weeks progressively worsening. Tried extra hctz pill, is avoiding salt/sodium, is elevating legs at night. Staying active. Denies chest pain, dyspnea, palpitations, headache, urinary changes. Protein with each meal.   Recent shoulder surgery Sabra Heck). Currently on tramadol - rare use.  No other new medications. Currently off aspirin - discussed should be ok to restart pending approval by ortho.  She is taking hctz 25mg  daily and Kdur 63mEq BID.  Relevant past medical, surgical, family and social history reviewed and updated as indicated. Interim medical history since our last visit reviewed. Allergies and medications reviewed and updated. Current Outpatient Prescriptions on File Prior to Visit  Medication Sig  . aspirin 325 MG tablet Take 325 mg by mouth daily.  Marland Kitchen CALCIUM-VITAMIN D PO Take 1 tablet by mouth daily.    Marland Kitchen docusate sodium (COLACE) 100 MG capsule Take 100 mg by mouth daily as needed.   . gabapentin (NEURONTIN) 400 MG capsule Take 1 capsule (400 mg total) by mouth 3 (three) times daily.  . hydrochlorothiazide (HYDRODIURIL) 25 MG tablet TAKE 1 TABLET DAILY  . Multiple Vitamin (MULTIVITAMIN) tablet Take 1 tablet by mouth daily.    . Omega-3 Fatty Acids (FISH OIL) 1000 MG CAPS Take 1 capsule by mouth as directed.    Marland Kitchen omeprazole (PRILOSEC) 20 MG capsule Take 1 capsule (20 mg total) by mouth 2 (two) times daily.  . potassium chloride SA (K-DUR,KLOR-CON) 20 MEQ tablet Take 1 tablet (20 mEq total) by mouth 2 (two) times daily.  . [DISCONTINUED] potassium  chloride (K-DUR) 10 MEQ tablet Take 1 tablet (10 mEq total) by mouth daily.   No current facility-administered medications on file prior to visit.    Review of Systems Per HPI unless specifically indicated in ROS section     Objective:    BP 122/74 mmHg  Pulse 82  Temp(Src) 97.8 F (36.6 C) (Oral)  Wt 203 lb (92.08 kg)  SpO2 96%  Wt Readings from Last 3 Encounters:  04/05/16 203 lb (92.08 kg)  02/29/16 205 lb 12 oz (93.328 kg)  02/27/16 202 lb (91.627 kg)    Physical Exam  Constitutional: She appears well-developed and well-nourished. No distress.  HENT:  Mouth/Throat: Oropharynx is clear and moist. No oropharyngeal exudate.  Neck: Normal range of motion. Neck supple.  Cardiovascular: Regular rhythm, normal heart sounds and intact distal pulses.  Bradycardia present.   No murmur heard. Pulmonary/Chest: Effort normal and breath sounds normal. No respiratory distress. She has no wheezes. She has no rales.  Musculoskeletal: She exhibits edema (1+ dependent pedal edema to mid calf).  2+ DP bilaterally  Lymphadenopathy:    She has no cervical adenopathy.  Skin: Skin is warm and dry. No rash noted. No erythema.  Psychiatric: She has a normal mood and affect.  Nursing note and vitals reviewed.  Results for orders placed or performed in visit on [id number]  Basic metabolic panel  Result Value Ref Range   Sodium 142 135 - 145 mEq/L  Potassium 3.9 3.5 - 5.1 mEq/L   Chloride 102 96 - 112 mEq/L   CO2 32 19 - 32 mEq/L   Glucose, Bld 120 (H) 70 - 99 mg/dL   BUN 21 6 - 23 mg/dL   Creatinine, Ser 0.78 0.40 - 1.20 mg/dL   Calcium 10.1 8.4 - 10.5 mg/dL   GFR 75.90 >60.00 mL/min   Lab Results  Component Value Date   TSH 0.41 03/16/2015       Assessment & Plan:   Problem List Items Addressed This Visit    Pedal edema - Primary    Unclear etiology of newly worsening dependent edema but not consistent with cardiac or hepatic cause. ?worsening from more sedentary lifestyle after  recent shoulder surgery.  Check labs to further evaluate (CMP, BNP, TSH, urinalysis).  Discussed continued supportive care in form of elevation, low sodium diet, increased water and start wearing compression stockings (she has a pair at home). Encouraged good protein intake.  Pending labs will consider short lasix course.       Relevant Orders   Comprehensive metabolic panel   TSH   Brain natriuretic peptide       Follow up plan: No Follow-up on file.  Ria Bush, MD    Call grand daughter [name] [phone] with results

## 2016-04-07 ENCOUNTER — Other Ambulatory Visit: Payer: Self-pay | Admitting: Family Medicine

## 2016-04-07 DIAGNOSIS — R7989 Other specified abnormal findings of blood chemistry: Secondary | ICD-10-CM

## 2016-04-09 ENCOUNTER — Other Ambulatory Visit: Payer: Self-pay | Admitting: Family Medicine

## 2016-04-09 ENCOUNTER — Other Ambulatory Visit: Payer: Self-pay

## 2016-04-09 MED ORDER — FUROSEMIDE 20 MG PO TABS: 20.0000 mg | ORAL_TABLET | Freq: Every day | ORAL | Status: DC

## 2016-04-10 ENCOUNTER — Other Ambulatory Visit (INDEPENDENT_AMBULATORY_CARE_PROVIDER_SITE_OTHER): Payer: Medicare Other

## 2016-04-10 DIAGNOSIS — R7989 Other specified abnormal findings of blood chemistry: Secondary | ICD-10-CM

## 2016-04-10 DIAGNOSIS — R946 Abnormal results of thyroid function studies: Secondary | ICD-10-CM | POA: Diagnosis not present

## 2016-04-10 LAB — T4, FREE: FREE T4: 0.71 ng/dL (ref 0.60–1.60)

## 2016-04-10 LAB — TSH: TSH: 0.09 u[IU]/mL — AB (ref 0.35–4.50)

## 2016-04-11 LAB — T3: T3, Total: 143 ng/dL (ref 76–181)

## 2016-04-16 ENCOUNTER — Telehealth: Payer: Self-pay | Admitting: Family Medicine

## 2016-04-16 DIAGNOSIS — E039 Hypothyroidism, unspecified: Secondary | ICD-10-CM | POA: Insufficient documentation

## 2016-04-16 DIAGNOSIS — R7989 Other specified abnormal findings of blood chemistry: Secondary | ICD-10-CM

## 2016-04-16 NOTE — Telephone Encounter (Signed)
-----   Message from Marchia Bond sent at 04/13/2016 10:10 AM EDT ----- I notified pt's granddaughter [name] of results, and she verbalized understanding. She states if Dr. Glori Bickers agrees to endo referral, they would like to be referred to a Dr. in [place].

## 2016-04-16 NOTE — Telephone Encounter (Signed)
Endocrinology referral  Will route to [facility]

## 2016-04-23 DIAGNOSIS — E669 Obesity, unspecified: Secondary | ICD-10-CM | POA: Diagnosis not present

## 2016-04-23 DIAGNOSIS — I10 Essential (primary) hypertension: Secondary | ICD-10-CM | POA: Diagnosis not present

## 2016-04-23 DIAGNOSIS — S43421D Sprain of right rotator cuff capsule, subsequent encounter: Secondary | ICD-10-CM | POA: Diagnosis not present

## 2016-04-23 DIAGNOSIS — Z87891 Personal history of nicotine dependence: Secondary | ICD-10-CM | POA: Diagnosis not present

## 2016-04-23 DIAGNOSIS — M7541 Impingement syndrome of right shoulder: Secondary | ICD-10-CM | POA: Diagnosis not present

## 2016-04-23 DIAGNOSIS — R531 Weakness: Secondary | ICD-10-CM | POA: Diagnosis not present

## 2016-04-23 DIAGNOSIS — M19011 Primary osteoarthritis, right shoulder: Secondary | ICD-10-CM | POA: Diagnosis not present

## 2016-04-23 DIAGNOSIS — W19XXXD Unspecified fall, subsequent encounter: Secondary | ICD-10-CM | POA: Diagnosis not present

## 2016-04-23 DIAGNOSIS — Z7982 Long term (current) use of aspirin: Secondary | ICD-10-CM | POA: Diagnosis not present

## 2016-04-23 DIAGNOSIS — R2681 Unsteadiness on feet: Secondary | ICD-10-CM | POA: Diagnosis not present

## 2016-04-23 DIAGNOSIS — D751 Secondary polycythemia: Secondary | ICD-10-CM | POA: Diagnosis not present

## 2016-04-25 DIAGNOSIS — M7541 Impingement syndrome of right shoulder: Secondary | ICD-10-CM | POA: Diagnosis not present

## 2016-04-25 DIAGNOSIS — R2681 Unsteadiness on feet: Secondary | ICD-10-CM | POA: Diagnosis not present

## 2016-04-25 DIAGNOSIS — R531 Weakness: Secondary | ICD-10-CM | POA: Diagnosis not present

## 2016-04-25 DIAGNOSIS — S43421D Sprain of right rotator cuff capsule, subsequent encounter: Secondary | ICD-10-CM | POA: Diagnosis not present

## 2016-04-25 DIAGNOSIS — I10 Essential (primary) hypertension: Secondary | ICD-10-CM | POA: Diagnosis not present

## 2016-04-25 DIAGNOSIS — M19011 Primary osteoarthritis, right shoulder: Secondary | ICD-10-CM | POA: Diagnosis not present

## 2016-05-01 DIAGNOSIS — R531 Weakness: Secondary | ICD-10-CM | POA: Diagnosis not present

## 2016-05-01 DIAGNOSIS — R2681 Unsteadiness on feet: Secondary | ICD-10-CM | POA: Diagnosis not present

## 2016-05-01 DIAGNOSIS — M7541 Impingement syndrome of right shoulder: Secondary | ICD-10-CM | POA: Diagnosis not present

## 2016-05-01 DIAGNOSIS — S43421D Sprain of right rotator cuff capsule, subsequent encounter: Secondary | ICD-10-CM | POA: Diagnosis not present

## 2016-05-01 DIAGNOSIS — M19011 Primary osteoarthritis, right shoulder: Secondary | ICD-10-CM | POA: Diagnosis not present

## 2016-05-01 DIAGNOSIS — I10 Essential (primary) hypertension: Secondary | ICD-10-CM | POA: Diagnosis not present

## 2016-05-04 DIAGNOSIS — R2681 Unsteadiness on feet: Secondary | ICD-10-CM | POA: Diagnosis not present

## 2016-05-04 DIAGNOSIS — M7541 Impingement syndrome of right shoulder: Secondary | ICD-10-CM | POA: Diagnosis not present

## 2016-05-04 DIAGNOSIS — R531 Weakness: Secondary | ICD-10-CM | POA: Diagnosis not present

## 2016-05-04 DIAGNOSIS — I10 Essential (primary) hypertension: Secondary | ICD-10-CM | POA: Diagnosis not present

## 2016-05-04 DIAGNOSIS — M19011 Primary osteoarthritis, right shoulder: Secondary | ICD-10-CM | POA: Diagnosis not present

## 2016-05-04 DIAGNOSIS — S43421D Sprain of right rotator cuff capsule, subsequent encounter: Secondary | ICD-10-CM | POA: Diagnosis not present

## 2016-05-07 DIAGNOSIS — S43421D Sprain of right rotator cuff capsule, subsequent encounter: Secondary | ICD-10-CM | POA: Diagnosis not present

## 2016-05-07 DIAGNOSIS — R531 Weakness: Secondary | ICD-10-CM | POA: Diagnosis not present

## 2016-05-07 DIAGNOSIS — M19011 Primary osteoarthritis, right shoulder: Secondary | ICD-10-CM | POA: Diagnosis not present

## 2016-05-07 DIAGNOSIS — I10 Essential (primary) hypertension: Secondary | ICD-10-CM | POA: Diagnosis not present

## 2016-05-07 DIAGNOSIS — M7541 Impingement syndrome of right shoulder: Secondary | ICD-10-CM | POA: Diagnosis not present

## 2016-05-07 DIAGNOSIS — R2681 Unsteadiness on feet: Secondary | ICD-10-CM | POA: Diagnosis not present

## 2016-05-10 DIAGNOSIS — E0590 Thyrotoxicosis, unspecified without thyrotoxic crisis or storm: Secondary | ICD-10-CM | POA: Diagnosis not present

## 2016-05-10 DIAGNOSIS — R531 Weakness: Secondary | ICD-10-CM | POA: Diagnosis not present

## 2016-05-10 DIAGNOSIS — S43421D Sprain of right rotator cuff capsule, subsequent encounter: Secondary | ICD-10-CM | POA: Diagnosis not present

## 2016-05-10 DIAGNOSIS — M7541 Impingement syndrome of right shoulder: Secondary | ICD-10-CM | POA: Diagnosis not present

## 2016-05-10 DIAGNOSIS — M19011 Primary osteoarthritis, right shoulder: Secondary | ICD-10-CM | POA: Diagnosis not present

## 2016-05-10 DIAGNOSIS — R2681 Unsteadiness on feet: Secondary | ICD-10-CM | POA: Diagnosis not present

## 2016-05-10 DIAGNOSIS — I10 Essential (primary) hypertension: Secondary | ICD-10-CM | POA: Diagnosis not present

## 2016-05-15 DIAGNOSIS — M7541 Impingement syndrome of right shoulder: Secondary | ICD-10-CM | POA: Diagnosis not present

## 2016-05-15 DIAGNOSIS — S43421D Sprain of right rotator cuff capsule, subsequent encounter: Secondary | ICD-10-CM | POA: Diagnosis not present

## 2016-05-15 DIAGNOSIS — M19011 Primary osteoarthritis, right shoulder: Secondary | ICD-10-CM | POA: Diagnosis not present

## 2016-05-15 DIAGNOSIS — I10 Essential (primary) hypertension: Secondary | ICD-10-CM | POA: Diagnosis not present

## 2016-05-15 DIAGNOSIS — R2681 Unsteadiness on feet: Secondary | ICD-10-CM | POA: Diagnosis not present

## 2016-05-15 DIAGNOSIS — R531 Weakness: Secondary | ICD-10-CM | POA: Diagnosis not present

## 2016-05-18 DIAGNOSIS — R2681 Unsteadiness on feet: Secondary | ICD-10-CM | POA: Diagnosis not present

## 2016-05-18 DIAGNOSIS — S43421D Sprain of right rotator cuff capsule, subsequent encounter: Secondary | ICD-10-CM | POA: Diagnosis not present

## 2016-05-18 DIAGNOSIS — M19011 Primary osteoarthritis, right shoulder: Secondary | ICD-10-CM | POA: Diagnosis not present

## 2016-05-18 DIAGNOSIS — I10 Essential (primary) hypertension: Secondary | ICD-10-CM | POA: Diagnosis not present

## 2016-05-18 DIAGNOSIS — R531 Weakness: Secondary | ICD-10-CM | POA: Diagnosis not present

## 2016-05-18 DIAGNOSIS — M7541 Impingement syndrome of right shoulder: Secondary | ICD-10-CM | POA: Diagnosis not present

## 2016-05-20 ENCOUNTER — Other Ambulatory Visit: Payer: Self-pay | Admitting: Family Medicine

## 2016-05-29 DIAGNOSIS — R531 Weakness: Secondary | ICD-10-CM | POA: Diagnosis not present

## 2016-05-29 DIAGNOSIS — I10 Essential (primary) hypertension: Secondary | ICD-10-CM | POA: Diagnosis not present

## 2016-05-29 DIAGNOSIS — R2681 Unsteadiness on feet: Secondary | ICD-10-CM | POA: Diagnosis not present

## 2016-05-29 DIAGNOSIS — S43421D Sprain of right rotator cuff capsule, subsequent encounter: Secondary | ICD-10-CM | POA: Diagnosis not present

## 2016-05-29 DIAGNOSIS — M7541 Impingement syndrome of right shoulder: Secondary | ICD-10-CM | POA: Diagnosis not present

## 2016-05-29 DIAGNOSIS — M19011 Primary osteoarthritis, right shoulder: Secondary | ICD-10-CM | POA: Diagnosis not present

## 2016-06-05 DIAGNOSIS — S43421D Sprain of right rotator cuff capsule, subsequent encounter: Secondary | ICD-10-CM | POA: Diagnosis not present

## 2016-06-05 DIAGNOSIS — M7541 Impingement syndrome of right shoulder: Secondary | ICD-10-CM | POA: Diagnosis not present

## 2016-06-05 DIAGNOSIS — M19011 Primary osteoarthritis, right shoulder: Secondary | ICD-10-CM | POA: Diagnosis not present

## 2016-06-05 DIAGNOSIS — I10 Essential (primary) hypertension: Secondary | ICD-10-CM | POA: Diagnosis not present

## 2016-06-05 DIAGNOSIS — R2681 Unsteadiness on feet: Secondary | ICD-10-CM | POA: Diagnosis not present

## 2016-06-05 DIAGNOSIS — R531 Weakness: Secondary | ICD-10-CM | POA: Diagnosis not present

## 2016-06-08 DIAGNOSIS — R2681 Unsteadiness on feet: Secondary | ICD-10-CM | POA: Diagnosis not present

## 2016-06-08 DIAGNOSIS — I10 Essential (primary) hypertension: Secondary | ICD-10-CM | POA: Diagnosis not present

## 2016-06-08 DIAGNOSIS — M19011 Primary osteoarthritis, right shoulder: Secondary | ICD-10-CM | POA: Diagnosis not present

## 2016-06-08 DIAGNOSIS — R531 Weakness: Secondary | ICD-10-CM | POA: Diagnosis not present

## 2016-06-08 DIAGNOSIS — S43421D Sprain of right rotator cuff capsule, subsequent encounter: Secondary | ICD-10-CM | POA: Diagnosis not present

## 2016-06-08 DIAGNOSIS — M7541 Impingement syndrome of right shoulder: Secondary | ICD-10-CM | POA: Diagnosis not present

## 2016-06-12 DIAGNOSIS — S43421D Sprain of right rotator cuff capsule, subsequent encounter: Secondary | ICD-10-CM | POA: Diagnosis not present

## 2016-06-12 DIAGNOSIS — M7541 Impingement syndrome of right shoulder: Secondary | ICD-10-CM | POA: Diagnosis not present

## 2016-06-12 DIAGNOSIS — I10 Essential (primary) hypertension: Secondary | ICD-10-CM | POA: Diagnosis not present

## 2016-06-12 DIAGNOSIS — R531 Weakness: Secondary | ICD-10-CM | POA: Diagnosis not present

## 2016-06-12 DIAGNOSIS — R2681 Unsteadiness on feet: Secondary | ICD-10-CM | POA: Diagnosis not present

## 2016-06-12 DIAGNOSIS — M19011 Primary osteoarthritis, right shoulder: Secondary | ICD-10-CM | POA: Diagnosis not present

## 2016-06-19 DIAGNOSIS — M7541 Impingement syndrome of right shoulder: Secondary | ICD-10-CM | POA: Diagnosis not present

## 2016-06-19 DIAGNOSIS — I10 Essential (primary) hypertension: Secondary | ICD-10-CM | POA: Diagnosis not present

## 2016-06-19 DIAGNOSIS — S43421D Sprain of right rotator cuff capsule, subsequent encounter: Secondary | ICD-10-CM | POA: Diagnosis not present

## 2016-06-19 DIAGNOSIS — R2681 Unsteadiness on feet: Secondary | ICD-10-CM | POA: Diagnosis not present

## 2016-06-19 DIAGNOSIS — M19011 Primary osteoarthritis, right shoulder: Secondary | ICD-10-CM | POA: Diagnosis not present

## 2016-06-19 DIAGNOSIS — R531 Weakness: Secondary | ICD-10-CM | POA: Diagnosis not present

## 2016-07-04 ENCOUNTER — Other Ambulatory Visit: Payer: Self-pay

## 2016-07-10 DIAGNOSIS — M7541 Impingement syndrome of right shoulder: Secondary | ICD-10-CM | POA: Diagnosis not present

## 2016-07-21 ENCOUNTER — Emergency Department: Payer: Medicare Other

## 2016-07-21 ENCOUNTER — Emergency Department
Admission: EM | Admit: 2016-07-21 | Discharge: 2016-07-21 | Disposition: A | Discharge status: Home / Self Care | Payer: Medicare Other | Attending: Emergency Medicine | Admitting: Emergency Medicine

## 2016-07-21 DIAGNOSIS — Z7982 Long term (current) use of aspirin: Secondary | ICD-10-CM | POA: Diagnosis not present

## 2016-07-21 DIAGNOSIS — W01198A Fall on same level from slipping, tripping and stumbling with subsequent striking against other object, initial encounter: Secondary | ICD-10-CM | POA: Diagnosis not present

## 2016-07-21 DIAGNOSIS — S0101XA Laceration without foreign body of scalp, initial encounter: Secondary | ICD-10-CM | POA: Diagnosis not present

## 2016-07-21 DIAGNOSIS — Z79899 Other long term (current) drug therapy: Secondary | ICD-10-CM | POA: Insufficient documentation

## 2016-07-21 DIAGNOSIS — E119 Type 2 diabetes mellitus without complications: Secondary | ICD-10-CM | POA: Insufficient documentation

## 2016-07-21 DIAGNOSIS — Z87891 Personal history of nicotine dependence: Secondary | ICD-10-CM | POA: Insufficient documentation

## 2016-07-21 DIAGNOSIS — S199XXA Unspecified injury of neck, initial encounter: Secondary | ICD-10-CM | POA: Diagnosis not present

## 2016-07-21 DIAGNOSIS — S0990XA Unspecified injury of head, initial encounter: Secondary | ICD-10-CM

## 2016-07-21 DIAGNOSIS — Z23 Encounter for immunization: Secondary | ICD-10-CM | POA: Diagnosis not present

## 2016-07-21 DIAGNOSIS — W19XXXA Unspecified fall, initial encounter: Secondary | ICD-10-CM

## 2016-07-21 DIAGNOSIS — Y9301 Activity, walking, marching and hiking: Secondary | ICD-10-CM | POA: Insufficient documentation

## 2016-07-21 DIAGNOSIS — I10 Essential (primary) hypertension: Secondary | ICD-10-CM | POA: Insufficient documentation

## 2016-07-21 DIAGNOSIS — Y92129 Unspecified place in nursing home as the place of occurrence of the external cause: Secondary | ICD-10-CM | POA: Insufficient documentation

## 2016-07-21 DIAGNOSIS — Y999 Unspecified external cause status: Secondary | ICD-10-CM | POA: Diagnosis not present

## 2016-07-21 HISTORY — DX: Other specified postprocedural states: Z98.890

## 2016-07-21 MED ORDER — TETANUS-DIPHTH-ACELL PERTUSSIS 5-2.5-18.5 LF-MCG/0.5 IM SUSP
0.5000 mL | Freq: Once | INTRAMUSCULAR | Status: AC
  Administered 2016-07-21: 0.5 mL via INTRAMUSCULAR
  Filled 2016-07-21: qty 0.5

## 2016-07-21 MED ORDER — ACETAMINOPHEN 500 MG PO TABS
1000.0000 mg | ORAL_TABLET | Freq: Once | ORAL | Status: AC
  Administered 2016-07-21: 1000 mg via ORAL
  Filled 2016-07-21: qty 2

## 2016-07-21 NOTE — ED Provider Notes (Signed)
[facility] Emergency Department Provider Note  ____________________________________________  Time seen: Approximately 4:58 PM  I have reviewed the triage vital signs and the nursing notes.   HISTORY  Chief Complaint Fall   HPI Regina Harris is a 80 y.o. female history of diabetes, hypertension, hyperlipidemia who presents for evaluation of scalp laceration status post mechanical fall. Patient was visiting her husband in a nursing home. She was trying to get out of the room that some people are trying to get in. She started to walk backwards and tripped onto a rocking chair falling on the ground. She hit her head onto a piece of furniture. She denies LOC, she denies being on any blood thinners. She has no pain at this time and specifically denies headache, nausea, vomiting, changes in vision, neck pain, back pain, chest pain, abdominal pain. She denies any preceding lightheadedness, palpitations, chest pain or shortness of breath. She does not remember when her last tetanus shot was.  Past Medical History:  Diagnosis Date  . Barrett esophagus   . Carotid bruit   . Diabetes mellitus without complication ([place])   . Esophagitis   . Gastritis 2013  . GERD (gastroesophageal reflux disease)   . HH (hiatus hernia)   . History of colonic polyps   . History of repair of right rotator cuff   . Hyperlipidemia   . Hypertension   . Lump or mass in breast   . Pneumonia   . Polycythemia, secondary   . Tobacco abuse     Patient Active Problem List   Diagnosis Date Noted  . Low TSH level 04/16/2016  . Pedal edema 04/05/2016  . Right rotator cuff tear 03/09/2016  . Weakness 03/09/2016  . Hypokalemia 03/01/2016  . Preoperative examination 02/29/2016  . Nodule of chest wall 10/06/2014  . Left breast mass 06/30/2014  . Encounter for Medicare annual wellness exam 02/22/2014  . Polycythemia, secondary 02/22/2014  . GERD (gastroesophageal reflux disease) 08/24/2013    . Lump or mass in breast 05/06/2013  . Post-menopausal 06/24/2012  . Obesity 12/24/2011  . Hyperglycemia 03/12/2011  . NEOPLASM UNSPECIFIED NATURE DIGESTIVE SYSTEM 07/24/2010  . LUNG NODULE 01/16/2010  . HYPERCHOLESTEROLEMIA, PURE 07/10/2007  . Former smoker 03/19/2007  . Essential hypertension 03/19/2007  . Carotid bruit 03/19/2007  . COLONIC POLYPS, HX OF 03/19/2007    Past Surgical History:  Procedure Laterality Date  . ABDOMINAL HYSTERECTOMY    . APPENDECTOMY    . BREAST BIOPSY Right 1996  . BREAST BIOPSY Left 10/09/2012   Benign breast tissue with focal fat necrosis and focal periductal chronic inflammation.  Marland Kitchen BREAST BIOPSY Left 10/09/2012   Benign breast tissue with focal fat necrosis and focal periductal chronic inflammation.  Marland Kitchen CATARACT EXTRACTION  3/11   Dr Charise Killian  . CHOLECYSTECTOMY    . COLONOSCOPY  2009  . ESOPHAGOGASTRODUODENOSCOPY (EGD) WITH PROPOFOL N/A 11/02/2015   Procedure: ESOPHAGOGASTRODUODENOSCOPY (EGD) WITH PROPOFOL;  Surgeon: Manya Silvas, MD;  Location: [facility] ENDOSCOPY;  Service: Endoscopy;  Laterality: N/A;  . SHOULDER ARTHROSCOPY WITH OPEN ROTATOR CUFF REPAIR Right 03/05/2016   Procedure: SHOULDER ARTHROSCOPY WITH OPEN ROTATOR CUFF REPAIR;  Surgeon: Earnestine Leys, MD;  Location: [facility] ORS;  Service: Orthopedics;  Laterality: Right;  . UPPER GI ENDOSCOPY  2013    Prior to Admission medications   Medication Sig Start Date End Date Taking? Authorizing Provider  aspirin 325 MG tablet Take 325 mg by mouth daily.    Historical Provider, MD  CALCIUM-VITAMIN D  PO Take 1 tablet by mouth daily.      Historical Provider, MD  docusate sodium (COLACE) 100 MG capsule Take 100 mg by mouth daily as needed.     Historical Provider, MD  furosemide (LASIX) 20 MG tablet TAKE 1 TABLET (20 MG TOTAL) BY MOUTH DAILY. 05/21/16   Ria Bush, MD  gabapentin (NEURONTIN) 400 MG capsule Take 1 capsule (400 mg total) by mouth 3 (three) times daily. 03/05/16   Earnestine Leys, MD   hydrochlorothiazide (HYDRODIURIL) 25 MG tablet TAKE 1 TABLET DAILY 03/07/16   Abner Greenspan, MD  Multiple Vitamin (MULTIVITAMIN) tablet Take 1 tablet by mouth daily.      Historical Provider, MD  Omega-3 Fatty Acids (FISH OIL) 1000 MG CAPS Take 1 capsule by mouth as directed.      Historical Provider, MD  omeprazole (PRILOSEC) 20 MG capsule Take 1 capsule (20 mg total) by mouth 2 (two) times daily. 03/07/16   Abner Greenspan, MD  potassium chloride SA (K-DUR,KLOR-CON) 20 MEQ tablet Take 1 tablet (20 mEq total) by mouth 2 (two) times daily. 03/14/16   Abner Greenspan, MD    Allergies Review of patient's allergies indicates no known allergies.  Family History  Problem Relation Age of Onset  . Kidney cancer Father   . Diabetes Mother     Social History Social History  Substance Use Topics  . Smoking status: Former Smoker    Types: Cigarettes    Start date: 02/04/2015    Quit date: 02/04/2015  . Smokeless tobacco: Never Used  . Alcohol use No    Review of Systems Constitutional: Negative for fever. Eyes: Negative for visual changes. ENT: Negative for facial injury or neck injury Cardiovascular: Negative for chest injury. Respiratory: Negative for shortness of breath. Negative for chest wall injury. Gastrointestinal: Negative for abdominal pain or injury. Genitourinary: Negative for dysuria. Musculoskeletal: Negative for back injury, negative for arm or leg pain. Skin: + scalp laceration Neurological: + head injury.   ____________________________________________   PHYSICAL EXAM:  VITAL SIGNS: ED Triage Vitals  Enc Vitals Group     BP 07/21/16 1641 (!) 154/76     Pulse Rate 07/21/16 1641 100     Resp 07/21/16 1641 16     Temp 07/21/16 1641 98.2 F (36.8 C)     Temp Source 07/21/16 1641 Oral     SpO2 07/21/16 1641 96 %     Weight 07/21/16 1641 200 lb (90.7 kg)     Height 07/21/16 1641 5\' 7"  (1.702 m)     Head Circumference --      Peak Flow --      Pain Score 07/21/16 1642 0      Pain Loc --      Pain Edu? --      Excl. in [place]? --     Constitutional: Alert and oriented. No acute distress. Does not appear intoxicated. HEENT Head: Normocephalic and left scalp hematoma and 6cm laceration. Face: No facial bony tenderness. Stable midface Ears: No hemotympanum bilaterally. No Battle sign Eyes: No eye injury. PERRL. No raccoon eyes Nose: Nontender. No epistaxis. No rhinorrhea Mouth/Throat: Mucous membranes are moist. No oropharyngeal blood. No dental injury. Airway patent without stridor. Normal voice. Neck: no C-collar in place. No midline c-spine tenderness.  Cardiovascular: Normal rate, regular rhythm. Normal and symmetric distal pulses are present in all extremities. Pulmonary/Chest: Chest wall is stable and nontender to palpation/compression. Normal respiratory effort. Breath sounds are normal. No crepitus.  Abdominal:  Soft, nontender, non distended. Musculoskeletal: Nontender with normal full range of motion in all extremities. No deformities. No thoracic or lumbar midline spinal tenderness. Pelvis is stable. Skin: Skin is warm, dry and intact. 6 cm laceration to the left scalp  Psychiatric: Speech and behavior are appropriate. Neurological: Normal speech and language. Moves all extremities to command. No gross focal neurologic deficits are appreciated.  Glascow Coma Score: 4 - Opens eyes on own 6 - Follows simple motor commands 5 - Alert and oriented GCS: 15   ____________________________________________   LABS (all labs ordered are listed, but only abnormal results are displayed)  Labs Reviewed - No data to display ____________________________________________  EKG  none  ____________________________________________  RADIOLOGY  Head CT and c-spine:   IMPRESSION: 1. High left parietal scalp laceration. No evidence of acute intracranial abnormality. No evidence of calvarial fracture. 2. Mild chronic small vessel ischemia. 3. No cervical spine  fracture or subluxation. 4. Moderate degenerative changes throughout the cervical spine as described. ____________________________________________   PROCEDURES  Procedure(s) performed:yes .Marland KitchenLaceration Repair Date/Time: 07/21/2016 5:17 PM Performed by: Rudene Re Authorized by: Rudene Re   Consent:    Consent obtained:  Verbal   Consent given by:  Patient   Risks discussed:  Infection, pain and retained foreign body Anesthesia (see MAR for exact dosages):    Anesthesia method:  None Laceration details:    Location:  Scalp   Scalp location:  L parietal   Length (cm):  6 Repair type:    Repair type:  Simple Pre-procedure details:    Preparation:  Patient was prepped and draped in usual sterile fashion and imaging obtained to evaluate for foreign bodies Exploration:    Wound exploration: wound explored through full range of motion     Wound extent: no foreign bodies/material noted     Contaminated: no   Treatment:    Area cleansed with:  Saline   Amount of cleaning:  Standard   Irrigation solution:  Sterile saline   Irrigation method:  Pressure wash   Visualized foreign bodies/material removed: no   Skin repair:    Repair method:  Staples   Number of staples:  7 Approximation:    Approximation:  Close   Vermilion border: well-aligned   Post-procedure details:    Dressing:  Open (no dressing)   Patient tolerance of procedure:  Tolerated well, no immediate complications   Critical Care performed:  None ____________________________________________   INITIAL IMPRESSION / ASSESSMENT AND PLAN / ED COURSE   80 y.o. female history of diabetes, hypertension, hyperlipidemia who presents for evaluation of scalp laceration status post mechanical fall. Laceration repair per procedure note above. We'll renew tetanus shot. We'll get a head CT, CT cervical spine to rule out any further injuries. We'll give Tylenol.  Clinical Course  CT with no acute injury.  Patient remains neurologically intact. We'll discharge home with return precautions, wound care instructions, and follow-up with primary care doctor.  Pertinent labs & imaging results that were available during my care of the patient were reviewed by me and considered in my medical decision making (see chart for details).   I discussed my evaluation of the patient's symptoms, my clinical impression, and my proposed outpatient treatment plan with patient/ family members. We have discussed anticipatory guidance, scheduled follow-up, and careful return precautions. The patient expresses understanding and is comfortable with the discharge plan. All patient's questions were answered.    ____________________________________________   FINAL CLINICAL IMPRESSION(S) / ED DIAGNOSES  Final diagnoses:  Fall, initial encounter  Scalp laceration, initial encounter  Head trauma, initial encounter      NEW MEDICATIONS STARTED DURING THIS VISIT:  New Prescriptions   No medications on file     Note:  This document was prepared using Dragon voice recognition software and may include unintentional dictation errors.    Rudene Re, MD 07/21/16 [phone]

## 2016-07-21 NOTE — Discharge Instructions (Signed)
Keep laceration dry and clean. Wash with warm water and soap. Apply topical bacitracin. Protect from the sun to minimize scarring. Cover it with SPF 72 or higher and use hat when out in the sun for 6-9 months. You received 6 staples that must be removed in 7 days.  Watch for signs of infection: pus, redness of the skin surrounding it, or fever. If these develop see your doctor or return to the ER for antibiotics.  Return to the emergency room if you have a severe headache, back pain, chest pain, shortness of breath, facial droop, difficulty finding words, dizziness, weakness or numbness in one side of her body, or any other symptoms that are concerning to you.

## 2016-07-21 NOTE — ED Triage Notes (Signed)
Pt arrived via ems for laceration to left top of head approx 4" - bleeding controlled at this time - pt states she was turning around and tripped ona piece of furniture and fell - denies any dizziness or vomiting

## 2016-07-21 NOTE — ED Notes (Signed)
Pt arrived via ems for laceration to left top of head approx 4" - bleeding controlled at this time - pt states she was turning around and tripped ona piece of furniture and fell - denies any dizziness or vomiting - denies blurred vision - denies any pain

## 2016-07-31 ENCOUNTER — Ambulatory Visit (INDEPENDENT_AMBULATORY_CARE_PROVIDER_SITE_OTHER): Payer: Medicare Other | Admitting: Family Medicine

## 2016-07-31 ENCOUNTER — Encounter: Payer: Self-pay | Admitting: Family Medicine

## 2016-07-31 VITALS — BP 124/60 | HR 99 | Temp 98.1°F | Ht 66.0 in | Wt 205.5 lb

## 2016-07-31 DIAGNOSIS — S0101XA Laceration without foreign body of scalp, initial encounter: Secondary | ICD-10-CM | POA: Insufficient documentation

## 2016-07-31 DIAGNOSIS — W19XXXA Unspecified fall, initial encounter: Secondary | ICD-10-CM | POA: Insufficient documentation

## 2016-07-31 DIAGNOSIS — W19XXXD Unspecified fall, subsequent encounter: Secondary | ICD-10-CM | POA: Diagnosis not present

## 2016-07-31 DIAGNOSIS — S0101XD Laceration without foreign body of scalp, subsequent encounter: Secondary | ICD-10-CM | POA: Diagnosis not present

## 2016-07-31 DIAGNOSIS — Z23 Encounter for immunization: Secondary | ICD-10-CM | POA: Diagnosis not present

## 2016-07-31 DIAGNOSIS — Y92129 Unspecified place in nursing home as the place of occurrence of the external cause: Secondary | ICD-10-CM

## 2016-07-31 NOTE — Assessment & Plan Note (Signed)
From fall 10 days ago  Rev ED notes and studies-no concussion s/s and wound was closed with staples 6 staples removed today and area cleaned  Good healing -pt tolerated well  Disc aftercare and expectations Rev s/s of concussion-she was inst to alert [place] if she develops any symptoms

## 2016-07-31 NOTE — Patient Instructions (Signed)
Take care of yourself  The laceration on your head is healing well (the staples are out)  If you develop pain or redness or drainage -let me know  If you develop headache or dizziness -also let me know   Be careful to avoid falls

## 2016-07-31 NOTE — Progress Notes (Signed)
Subjective:    Patient ID: Regina Harris, female    DOB: 03-May-1936, 80 y.o.   MRN: [id number]  HPI Here for f/u from ED on 07/21/16 after a fall with head laceration   She was visiting her husband in a nursing home- stepped backwards and tripped over a chair Hit head on furniture  No LOC Denied concussion symptoms  BP Readings from Last 3 Encounters:  07/31/16 124/60  07/21/16 128/68  04/05/16 122/74     Ct Head Wo Contrast  Result Date: 07/21/2016 CLINICAL DATA:  Trauma.  Fall.  Left head injury with laceration. EXAM: CT HEAD WITHOUT CONTRAST CT CERVICAL SPINE WITHOUT CONTRAST TECHNIQUE: Multidetector CT imaging of the head and cervical spine was performed following the standard protocol without intravenous contrast. Multiplanar CT image reconstructions of the cervical spine were also generated. COMPARISON:  None. FINDINGS: CT HEAD FINDINGS Brain: No evidence of parenchymal hemorrhage or extra-axial fluid collection. No mass lesion, mass effect, or midline shift. No CT evidence of acute infarction. Intracranial atherosclerosis. Nonspecific mild subcortical and periventricular white matter hypodensity, most in keeping with chronic small vessel ischemic change. Cerebral volume is age appropriate. No ventriculomegaly. Vascular: No hyperdense vessel or unexpected calcification. Skull: No evidence of calvarial fracture. Sinuses/Orbits: The visualized paranasal sinuses are essentially clear. Other: Skin staples are noted in the high left parietal scalp with associated minimal subcutaneous emphysema and contusion in this location. The mastoid air cells are unopacified. CT CERVICAL SPINE FINDINGS Alignment: Straightening of the cervical spine.  No subluxation. Skull base and vertebrae: No acute fracture. No primary bone lesion or focal pathologic process. Soft tissues and spinal canal: No prevertebral fluid or swelling. No visible canal hematoma. Disc levels: Moderate degenerative disc disease  throughout the cervical spine. Severe bilateral facet arthropathy. Mild degenerative foraminal stenosis on the right at C3-4. Upper chest: Negative. Other: IMPRESSION: 1. High left parietal scalp laceration. No evidence of acute intracranial abnormality. No evidence of calvarial fracture. 2. Mild chronic small vessel ischemia. 3. No cervical spine fracture or subluxation. 4. Moderate degenerative changes throughout the cervical spine as described. Electronically Signed   By: Ilona Sorrel M.D.   On: 07/21/2016 17:44   Ct Cervical Spine Wo Contrast  Result Date: 07/21/2016 CLINICAL DATA:  Trauma.  Fall.  Left head injury with laceration. EXAM: CT HEAD WITHOUT CONTRAST CT CERVICAL SPINE WITHOUT CONTRAST TECHNIQUE: Multidetector CT imaging of the head and cervical spine was performed following the standard protocol without intravenous contrast. Multiplanar CT image reconstructions of the cervical spine were also generated. COMPARISON:  None. FINDINGS: CT HEAD FINDINGS Brain: No evidence of parenchymal hemorrhage or extra-axial fluid collection. No mass lesion, mass effect, or midline shift. No CT evidence of acute infarction. Intracranial atherosclerosis. Nonspecific mild subcortical and periventricular white matter hypodensity, most in keeping with chronic small vessel ischemic change. Cerebral volume is age appropriate. No ventriculomegaly. Vascular: No hyperdense vessel or unexpected calcification. Skull: No evidence of calvarial fracture. Sinuses/Orbits: The visualized paranasal sinuses are essentially clear. Other: Skin staples are noted in the high left parietal scalp with associated minimal subcutaneous emphysema and contusion in this location. The mastoid air cells are unopacified. CT CERVICAL SPINE FINDINGS Alignment: Straightening of the cervical spine.  No subluxation. Skull base and vertebrae: No acute fracture. No primary bone lesion or focal pathologic process. Soft tissues and spinal canal: No  prevertebral fluid or swelling. No visible canal hematoma. Disc levels: Moderate degenerative disc disease throughout the cervical spine. Severe bilateral facet arthropathy.  Mild degenerative foraminal stenosis on the right at C3-4. Upper chest: Negative. Other: IMPRESSION: 1. High left parietal scalp laceration. No evidence of acute intracranial abnormality. No evidence of calvarial fracture. 2. Mild chronic small vessel ischemia. 3. No cervical spine fracture or subluxation. 4. Moderate degenerative changes throughout the cervical spine as described. Electronically Signed   By: Ilona Sorrel M.D.   On: 07/21/2016 17:44     No evidence or hemorrhage or CS fracture   Needs staples removed      Chemistry      Component Value Date/Time   NA 138 04/05/2016 1413   K 3.6 04/05/2016 1413   CL 100 04/05/2016 1413   CO2 31 04/05/2016 1413   BUN 16 04/05/2016 1413   CREATININE 0.79 04/05/2016 1413      Component Value Date/Time   CALCIUM 10.1 04/05/2016 1413   ALKPHOS 64 04/05/2016 1413   AST 16 04/05/2016 1413   ALT 12 04/05/2016 1413   BILITOT 0.4 04/05/2016 1413      Lab Results  Component Value Date   WBC 8.3 02/27/2016   HGB 18.4 (H) 03/05/2016   HCT 54.0 (H) 03/05/2016   MCV 94.4 02/27/2016   PLT 178 02/27/2016    Pt feels fine -not too sore No headache or dizziness or nausea  No after effects    This is her first fall   (except out of bed)  Simply tripped -unfamiliar area  Does not think her balance is bad   A lot of stress Husband is in home hospice program- CHF and renal failure and DM Thinks she is doing fairly well howeve    Wants a flu shot today   Patient Active Problem List   Diagnosis Date Noted  . Scalp laceration 07/31/2016  . Fall at nursing home 07/31/2016  . Low TSH level 04/16/2016  . Pedal edema 04/05/2016  . Right rotator cuff tear 03/09/2016  . Weakness 03/09/2016  . Hypokalemia 03/01/2016  . Preoperative examination 02/29/2016  . Nodule of  chest wall 10/06/2014  . Left breast mass 06/30/2014  . Encounter for Medicare annual wellness exam 02/22/2014  . Polycythemia, secondary 02/22/2014  . GERD (gastroesophageal reflux disease) 08/24/2013  . Lump or mass in breast 05/06/2013  . Post-menopausal 06/24/2012  . Obesity 12/24/2011  . Hyperglycemia 03/12/2011  . NEOPLASM UNSPECIFIED NATURE DIGESTIVE SYSTEM 07/24/2010  . LUNG NODULE 01/16/2010  . HYPERCHOLESTEROLEMIA, PURE 07/10/2007  . Former smoker 03/19/2007  . Essential hypertension 03/19/2007  . Carotid bruit 03/19/2007  . COLONIC POLYPS, HX OF 03/19/2007   Past Medical History:  Diagnosis Date  . Barrett esophagus   . Carotid bruit   . Diabetes mellitus without complication ([place])   . Esophagitis   . Gastritis 2013  . GERD (gastroesophageal reflux disease)   . HH (hiatus hernia)   . History of colonic polyps   . History of repair of right rotator cuff   . Hyperlipidemia   . Hypertension   . Lump or mass in breast   . Pneumonia   . Polycythemia, secondary   . Tobacco abuse    Past Surgical History:  Procedure Laterality Date  . ABDOMINAL HYSTERECTOMY    . APPENDECTOMY    . BREAST BIOPSY Right 1996  . BREAST BIOPSY Left 10/09/2012   Benign breast tissue with focal fat necrosis and focal periductal chronic inflammation.  Marland Kitchen BREAST BIOPSY Left 10/09/2012   Benign breast tissue with focal fat necrosis and focal periductal chronic inflammation.  Marland Kitchen  CATARACT EXTRACTION  3/11   Dr Charise Killian  . CHOLECYSTECTOMY    . COLONOSCOPY  2009  . ESOPHAGOGASTRODUODENOSCOPY (EGD) WITH PROPOFOL N/A 11/02/2015   Procedure: ESOPHAGOGASTRODUODENOSCOPY (EGD) WITH PROPOFOL;  Surgeon: Manya Silvas, MD;  Location: [facility] ENDOSCOPY;  Service: Endoscopy;  Laterality: N/A;  . SHOULDER ARTHROSCOPY WITH OPEN ROTATOR CUFF REPAIR Right 03/05/2016   Procedure: SHOULDER ARTHROSCOPY WITH OPEN ROTATOR CUFF REPAIR;  Surgeon: Earnestine Leys, MD;  Location: [facility] ORS;  Service: Orthopedics;  Laterality:  Right;  . UPPER GI ENDOSCOPY  2013   Social History  Substance Use Topics  . Smoking status: Former Smoker    Types: Cigarettes    Start date: 02/04/2015    Quit date: 02/04/2015  . Smokeless tobacco: Never Used  . Alcohol use No   Family History  Problem Relation Age of Onset  . Kidney cancer Father   . Diabetes Mother    No Known Allergies Current Outpatient Prescriptions on File Prior to Visit  Medication Sig Dispense Refill  . aspirin 325 MG tablet Take 325 mg by mouth daily.    Marland Kitchen CALCIUM-VITAMIN D PO Take 1 tablet by mouth daily.      Marland Kitchen docusate sodium (COLACE) 100 MG capsule Take 100 mg by mouth daily as needed.     . furosemide (LASIX) 20 MG tablet TAKE 1 TABLET (20 MG TOTAL) BY MOUTH DAILY. 15 tablet 0  . gabapentin (NEURONTIN) 400 MG capsule Take 1 capsule (400 mg total) by mouth 3 (three) times daily. 60 capsule 3  . hydrochlorothiazide (HYDRODIURIL) 25 MG tablet TAKE 1 TABLET DAILY 90 tablet 2  . Multiple Vitamin (MULTIVITAMIN) tablet Take 1 tablet by mouth daily.      . Omega-3 Fatty Acids (FISH OIL) 1000 MG CAPS Take 1 capsule by mouth as directed.      Marland Kitchen omeprazole (PRILOSEC) 20 MG capsule Take 1 capsule (20 mg total) by mouth 2 (two) times daily. 180 capsule 2  . potassium chloride SA (K-DUR,KLOR-CON) 20 MEQ tablet Take 1 tablet (20 mEq total) by mouth 2 (two) times daily. 180 tablet 3  . [DISCONTINUED] potassium chloride (K-DUR) 10 MEQ tablet Take 1 tablet (10 mEq total) by mouth daily. 30 tablet 3   No current facility-administered medications on file prior to visit.     Review of Systems Review of Systems  Constitutional: Negative for fever, appetite change, and unexpected weight change.  Eyes: Negative for pain and visual disturbance.  Respiratory: Negative for cough and shortness of breath.   Cardiovascular: Negative for cp or palpitations    Gastrointestinal: Negative for nausea, diarrhea and constipation.  Genitourinary: Negative for urgency and frequency.    Skin: Negative for pallor or rash  pos for wound on head that is not swollen or draining  Neurological: Negative for weakness, light-headedness, numbness and headaches.  Hematological: Negative for adenopathy. Does not bruise/bleed easily.  Psychiatric/Behavioral: Negative for dysphoric mood. The patient is not nervous/anxious.  pos for caregiver stress        Objective:   Physical Exam  Constitutional: She appears well-developed and well-nourished. No distress.  obese and well appearing   HENT:  Head: Normocephalic and atraumatic.  Mouth/Throat: Oropharynx is clear and moist.  Eyes: Conjunctivae and EOM are normal. Pupils are equal, round, and reactive to light.  Neck: Normal range of motion. Neck supple. No JVD present. Carotid bruit is present. No thyromegaly present.  Cardiovascular: Normal rate, regular rhythm, normal heart sounds and intact distal pulses.  Exam reveals  no gallop.   Pulmonary/Chest: Effort normal and breath sounds normal. No respiratory distress. She has no wheezes. She has no rales.  No crackles  Diffusely distant bs   Abdominal: She exhibits no abdominal bruit.  Musculoskeletal: She exhibits no edema.  Lymphadenopathy:    She has no cervical adenopathy.  Neurological: She is alert. She has normal reflexes. She displays no atrophy and no tremor. No cranial nerve deficit or sensory deficit. She exhibits normal muscle tone. Coordination and gait normal.  Skin: Skin is warm and dry. No rash noted. No pallor.  Clean /healing linear wound on L side of scalp/parietal area - wound edges well approx with some mild scabbing  No erythema or drainage or swelling  6 staples removed w/o discomfort  Area cleaned    Psychiatric: She has a normal mood and affect.  Mentally sharp and attentive   occ tearful when disc her husband's illness           Assessment & Plan:   Problem List Items Addressed This Visit      Other   Fall at nursing home    Resulting in a  head laceration-now healing Rev ED notes and studies in detail with pt  Re assuring exam  Spent time disc fall prev in and out of the home  Pt states she was distracted and tripped-understandable  Balance seems fairly nl today  Given handout on fall prev       Scalp laceration    From fall 10 days ago  Rev ED notes and studies-no concussion s/s and wound was closed with staples 6 staples removed today and area cleaned  Good healing -pt tolerated well  Disc aftercare and expectations Rev s/s of concussion-she was inst to alert [place] if she develops any symptoms       Other Visit Diagnoses    Need for influenza vaccination    -  Primary   Relevant Orders   Flu Vaccine QUAD 36+ mos IM (Completed)

## 2016-07-31 NOTE — Assessment & Plan Note (Signed)
Resulting in a head laceration-now healing Rev ED notes and studies in detail with pt  Re assuring exam  Spent time disc fall prev in and out of the home  Pt states she was distracted and tripped-understandable  Balance seems fairly nl today  Given handout on fall prev

## 2016-07-31 NOTE — Progress Notes (Signed)
Pre visit review using our clinic review tool, if applicable. No additional management support is needed unless otherwise documented below in the visit note. 

## 2016-09-10 DIAGNOSIS — E0590 Thyrotoxicosis, unspecified without thyrotoxic crisis or storm: Secondary | ICD-10-CM | POA: Diagnosis not present

## 2016-10-02 DIAGNOSIS — Z780 Asymptomatic menopausal state: Secondary | ICD-10-CM | POA: Diagnosis not present

## 2016-10-02 DIAGNOSIS — E0590 Thyrotoxicosis, unspecified without thyrotoxic crisis or storm: Secondary | ICD-10-CM | POA: Diagnosis not present

## 2016-10-02 DIAGNOSIS — Z13820 Encounter for screening for osteoporosis: Secondary | ICD-10-CM | POA: Diagnosis not present

## 2016-12-05 DIAGNOSIS — M7541 Impingement syndrome of right shoulder: Secondary | ICD-10-CM | POA: Diagnosis not present

## 2016-12-14 ENCOUNTER — Other Ambulatory Visit: Payer: Self-pay | Admitting: Family Medicine

## 2016-12-17 NOTE — Telephone Encounter (Signed)
[name] called to request refill omeprazole to [facility]; advised [name] refilled omeprazole on 12/14/16 to [facility]; [name] will ck with pharmacy.

## 2016-12-30 ENCOUNTER — Other Ambulatory Visit: Payer: Self-pay | Admitting: Family Medicine

## 2016-12-31 DIAGNOSIS — M25511 Pain in right shoulder: Secondary | ICD-10-CM | POA: Diagnosis not present

## 2016-12-31 DIAGNOSIS — M75121 Complete rotator cuff tear or rupture of right shoulder, not specified as traumatic: Secondary | ICD-10-CM | POA: Diagnosis not present

## 2017-01-04 ENCOUNTER — Other Ambulatory Visit: Payer: Self-pay | Admitting: Family Medicine

## 2017-01-04 DIAGNOSIS — M25411 Effusion, right shoulder: Secondary | ICD-10-CM | POA: Diagnosis not present

## 2017-01-04 DIAGNOSIS — S46811A Strain of other muscles, fascia and tendons at shoulder and upper arm level, right arm, initial encounter: Secondary | ICD-10-CM | POA: Diagnosis not present

## 2017-01-04 DIAGNOSIS — M75121 Complete rotator cuff tear or rupture of right shoulder, not specified as traumatic: Secondary | ICD-10-CM | POA: Diagnosis not present

## 2017-02-12 ENCOUNTER — Telehealth: Payer: Self-pay | Admitting: Family Medicine

## 2017-02-12 DIAGNOSIS — I10 Essential (primary) hypertension: Secondary | ICD-10-CM

## 2017-02-12 DIAGNOSIS — R739 Hyperglycemia, unspecified: Secondary | ICD-10-CM

## 2017-02-12 DIAGNOSIS — R7989 Other specified abnormal findings of blood chemistry: Secondary | ICD-10-CM

## 2017-02-12 DIAGNOSIS — E7800 Pure hypercholesterolemia, unspecified: Secondary | ICD-10-CM

## 2017-02-12 NOTE — Telephone Encounter (Signed)
-----   Message from Ria Bush, MD sent at 02/12/2017  9:54 PM EDT ----- Regarding: FW: Lab orders 4/12 [facility] patient. Thanks  ----- Message ----- From: Eustace Pen, LPN Sent: 12/19/863  12:41 PM To: Ria Bush, MD Subject: Lab orders 4/12                                Please place lab orders. Thank you.

## 2017-02-14 ENCOUNTER — Ambulatory Visit (INDEPENDENT_AMBULATORY_CARE_PROVIDER_SITE_OTHER): Payer: Medicare Other

## 2017-02-14 VITALS — BP 120/70 | HR 69 | Temp 97.8°F | Ht 65.5 in | Wt 193.5 lb

## 2017-02-14 DIAGNOSIS — R739 Hyperglycemia, unspecified: Secondary | ICD-10-CM | POA: Diagnosis not present

## 2017-02-14 DIAGNOSIS — I10 Essential (primary) hypertension: Secondary | ICD-10-CM

## 2017-02-14 DIAGNOSIS — E7800 Pure hypercholesterolemia, unspecified: Secondary | ICD-10-CM | POA: Diagnosis not present

## 2017-02-14 DIAGNOSIS — R946 Abnormal results of thyroid function studies: Secondary | ICD-10-CM | POA: Diagnosis not present

## 2017-02-14 DIAGNOSIS — Z0000 Encounter for general adult medical examination without abnormal findings: Secondary | ICD-10-CM | POA: Diagnosis not present

## 2017-02-14 DIAGNOSIS — R7989 Other specified abnormal findings of blood chemistry: Secondary | ICD-10-CM

## 2017-02-14 LAB — CBC WITH DIFFERENTIAL/PLATELET
BASOS ABS: 0 10*3/uL (ref 0.0–0.1)
Basophils Relative: 0.4 % (ref 0.0–3.0)
EOS ABS: 0.2 10*3/uL (ref 0.0–0.7)
Eosinophils Relative: 2.5 % (ref 0.0–5.0)
HCT: 52.7 % — ABNORMAL HIGH (ref 36.0–46.0)
Hemoglobin: 17.9 g/dL — ABNORMAL HIGH (ref 12.0–15.0)
LYMPHS ABS: 2.1 10*3/uL (ref 0.7–4.0)
LYMPHS PCT: 24.2 % (ref 12.0–46.0)
MCHC: 34 g/dL (ref 30.0–36.0)
MCV: 93.2 fl (ref 78.0–100.0)
MONOS PCT: 8.9 % (ref 3.0–12.0)
Monocytes Absolute: 0.8 10*3/uL (ref 0.1–1.0)
NEUTROS ABS: 5.4 10*3/uL (ref 1.4–7.7)
NEUTROS PCT: 64 % (ref 43.0–77.0)
Platelets: 223 10*3/uL (ref 150.0–400.0)
RBC: 5.66 Mil/uL — ABNORMAL HIGH (ref 3.87–5.11)
RDW: 13.7 % (ref 11.5–15.5)
WBC: 8.5 10*3/uL (ref 4.0–10.5)

## 2017-02-14 LAB — LIPID PANEL
CHOLESTEROL: 200 mg/dL (ref 0–200)
HDL: 51.4 mg/dL (ref >39.00)
LDL CALC: 123 mg/dL — AB (ref 0–99)
NonHDL: 148.48
Total CHOL/HDL Ratio: 4
Triglycerides: 127 mg/dL (ref 0.0–149.0)
VLDL: 25.4 mg/dL (ref 0.0–40.0)

## 2017-02-14 LAB — COMPREHENSIVE METABOLIC PANEL
ALT: 12 U/L (ref 0–35)
AST: 15 U/L (ref 0–37)
Albumin: 3.9 g/dL (ref 3.5–5.2)
Alkaline Phosphatase: 71 U/L (ref 39–117)
BUN: 16 mg/dL (ref 6–23)
CHLORIDE: 104 meq/L (ref 96–112)
CO2: 32 mEq/L (ref 19–32)
CREATININE: 0.95 mg/dL (ref 0.40–1.20)
Calcium: 10.1 mg/dL (ref 8.4–10.5)
GFR: 60.31 mL/min (ref >60.00)
GLUCOSE: 108 mg/dL — AB (ref 70–99)
Potassium: 4.4 mEq/L (ref 3.5–5.1)
Sodium: 140 mEq/L (ref 135–145)
Total Bilirubin: 0.7 mg/dL (ref 0.2–1.2)
Total Protein: 7.2 g/dL (ref 6.0–8.3)

## 2017-02-14 LAB — HEMOGLOBIN A1C: Hgb A1c MFr Bld: 6.3 % (ref 4.6–6.5)

## 2017-02-14 LAB — TSH: TSH: 0.1 u[IU]/mL — ABNORMAL LOW (ref 0.35–4.50)

## 2017-02-14 NOTE — Progress Notes (Signed)
Subjective:   Regina Harris is a 81 y.o. female who presents for Medicare Annual (Subsequent) preventive examination.  Review of Systems:  N/A Cardiac Risk Factors include: advanced age (>73men, >41 women);obesity (BMI >30kg/m2);dyslipidemia;hypertension     Objective:     Vitals: BP 120/70 (BP Location: Left Arm, Patient Position: Sitting, Cuff Size: Normal)   Pulse 69   Temp 97.8 F (36.6 C) (Oral)   Ht 5' 5.5" (1.664 m) Comment: no shoes  Wt 193 lb 8 oz (87.8 kg)   SpO2 92%   BMI 31.71 kg/m   Body mass index is 31.71 kg/m.   Tobacco History  Smoking Status  . Former Smoker  . Types: Cigarettes  . Start date: 02/04/2015  . Quit date: 02/04/2015  Smokeless Tobacco  . Never Used     Counseling given: No   Past Medical History:  Diagnosis Date  . Barrett esophagus   . Carotid bruit   . Diabetes mellitus without complication ([place])   . Esophagitis   . Gastritis 2013  . GERD (gastroesophageal reflux disease)   . HH (hiatus hernia)   . History of colonic polyps   . History of repair of right rotator cuff   . Hyperlipidemia   . Hypertension   . Lump or mass in breast   . Pneumonia   . Polycythemia, secondary   . Tobacco abuse    Past Surgical History:  Procedure Laterality Date  . ABDOMINAL HYSTERECTOMY    . APPENDECTOMY    . BREAST BIOPSY Right 1996  . BREAST BIOPSY Left 10/09/2012   Benign breast tissue with focal fat necrosis and focal periductal chronic inflammation.  Marland Kitchen BREAST BIOPSY Left 10/09/2012   Benign breast tissue with focal fat necrosis and focal periductal chronic inflammation.  Marland Kitchen CATARACT EXTRACTION  3/11   Dr Charise Killian  . CHOLECYSTECTOMY    . COLONOSCOPY  2009  . ESOPHAGOGASTRODUODENOSCOPY (EGD) WITH PROPOFOL N/A 11/02/2015   Procedure: ESOPHAGOGASTRODUODENOSCOPY (EGD) WITH PROPOFOL;  Surgeon: Manya Silvas, MD;  Location: [facility] ENDOSCOPY;  Service: Endoscopy;  Laterality: N/A;  . SHOULDER ARTHROSCOPY WITH OPEN ROTATOR CUFF REPAIR Right  03/05/2016   Procedure: SHOULDER ARTHROSCOPY WITH OPEN ROTATOR CUFF REPAIR;  Surgeon: Earnestine Leys, MD;  Location: [facility] ORS;  Service: Orthopedics;  Laterality: Right;  . UPPER GI ENDOSCOPY  2013   Family History  Problem Relation Age of Onset  . Kidney cancer Father   . Diabetes Mother    History  Sexual Activity  . Sexual activity: No    Outpatient Encounter Prescriptions as of 02/14/2017  Medication Sig  . aspirin EC 81 MG tablet Take 81 mg by mouth daily.  Marland Kitchen CALCIUM-VITAMIN D PO Take 1 tablet by mouth daily.    Marland Kitchen gabapentin (NEURONTIN) 400 MG capsule Take 1 capsule (400 mg total) by mouth 3 (three) times daily. (Patient taking differently: Take 400 mg by mouth 2 (two) times daily. )  . hydrochlorothiazide (HYDRODIURIL) 25 MG tablet TAKE 1 TABLET DAILY  . omeprazole (PRILOSEC) 20 MG capsule TAKE 1 CAPSULE TWICE DAILY  . potassium chloride SA (K-DUR,KLOR-CON) 20 MEQ tablet Take 1 tablet (20 mEq total) by mouth 2 (two) times daily.  . furosemide (LASIX) 20 MG tablet TAKE 1 TABLET (20 MG TOTAL) BY MOUTH DAILY. (Patient not taking: Reported on 02/14/2017)  . [DISCONTINUED] aspirin 325 MG tablet Take 325 mg by mouth daily.  . [DISCONTINUED] docusate sodium (COLACE) 100 MG capsule Take 100 mg by mouth daily as needed.   . [  DISCONTINUED] Multiple Vitamin (MULTIVITAMIN) tablet Take 1 tablet by mouth daily.    . [DISCONTINUED] Omega-3 Fatty Acids (FISH OIL) 1000 MG CAPS Take 1 capsule by mouth as directed.     No facility-administered encounter medications on file as of 02/14/2017.     Activities of Daily Living In your present state of health, do you have any difficulty performing the following activities: 02/14/2017 02/27/2016  Hearing? N N  Vision? N N  Difficulty concentrating or making decisions? N N  Walking or climbing stairs? N N  Dressing or bathing? N N  Doing errands, shopping? N N  Preparing Food and eating ? N -  Using the Toilet? N -  In the past six months, have you  accidently leaked urine? N -  Do you have problems with loss of bowel control? N -  Managing your Medications? N -  Managing your Finances? N -  Housekeeping or managing your Housekeeping? N -  Some recent data might be hidden    Patient Care Team: Abner Greenspan, MD as PCP - General Robert Bellow, MD (General Surgery)    Assessment:     Hearing Screening   125Hz  250Hz  500Hz  1000Hz  2000Hz  3000Hz  4000Hz  6000Hz  8000Hz   Right ear:   40 40 40  40    Left ear:   40 40 40  40    Vision Screening Comments: Last vision in exam in 2017 with Dr. Chong Sicilian   Exercise Activities and Dietary recommendations Current Exercise Habits: Home exercise routine, Type of exercise: walking, Time (Minutes): 10, Frequency (Times/Week): 2, Weekly Exercise (Minutes/Week): 20, Intensity: Mild, Exercise limited by: None identified  Goals    . Increase physical activity          Starting 02/14/17, I will continue to walk at least 10 min 2-3 days per week. When weather permits, I will attempt to walk additional days.       Fall Risk Fall Risk  02/14/2017 07/04/2016 03/16/2015 02/22/2014  Falls in the past year? No Yes No No  Number falls in past yr: - 2 or more - -  Injury with Fall? - Yes - -   Depression Screen PHQ 2/9 Scores 02/14/2017 03/16/2015 02/22/2014  PHQ - 2 Score 0 0 0     Cognitive Function MMSE - Mini Mental State Exam 02/14/2017  Orientation to time 5  Orientation to Place 5  Registration 3  Attention/ Calculation 0  Recall 2  Recall-comments pt was unable to recall 1 of 3 words  Language- name 2 objects 0  Language- repeat 1  Language- follow 3 step command 2  Language- follow 3 step command-comments pt was unable to follow 1 step of 3 step command  Language- read & follow direction 0  Write a sentence 0  Copy design 0  Total score 18     PLEASE NOTE: A Mini-Cog screen was completed. Maximum score is 20. A value of 0 denotes this part of [place] MMSE was not completed or the  patient failed this part of the Mini-Cog screening.   Mini-Cog Screening Orientation to Time - Max 5 pts Orientation to Place - Max 5 pts Registration - Max 3 pts Recall - Max 3 pts Language Repeat - Max 1 pts Language Follow 3 Step Command - Max 3 pts     Immunization History  Administered Date(s) Administered  . Influenza Whole 12/18/2006, 01/03/2010, 08/21/2010  . Influenza,inj,Quad PF,36+ Mos 08/24/2013, 09/21/2014, 10/06/2015, 07/31/2016  . Pneumococcal Conjugate-13 02/22/2014  .  Pneumococcal Polysaccharide-23 04/09/2005  . Td 12/19/1998, 06/24/2012  . Tdap 07/24/2015, 07/21/2016   Screening Tests Health Maintenance  Topic Date Due  . MAMMOGRAM  03/04/2026 (Originally 07/08/2015)  . INFLUENZA VACCINE  06/05/2017  . TETANUS/TDAP  07/21/2026  . DEXA SCAN  Completed  . PNA vac Low Risk Adult  Completed      Plan:     I have personally reviewed and addressed the Medicare Annual Wellness questionnaire and have noted the following in the patient's chart:  A. Medical and social history B. Use of alcohol, tobacco or illicit drugs  C. Current medications and supplements D. Functional ability and status E.  Nutritional status F.  Physical activity G. Advance directives H. List of other physicians I.  Hospitalizations, surgeries, and ER visits in previous 12 months J.  [place] to include hearing, vision, cognitive, depression L. Referrals and appointments - none  In addition, I have reviewed and discussed with patient certain preventive protocols, quality metrics, and best practice recommendations. A written personalized care plan for preventive services as well as general preventive health recommendations were provided to patient.  See attached scanned questionnaire for additional information.   Signed,   Lindell Noe, MHA, BS, LPN Health Coach

## 2017-02-14 NOTE — Patient Instructions (Addendum)
Regina Harris , Thank you for taking time to come for your Medicare Wellness Visit. I appreciate your ongoing commitment to your health goals. Please review the following plan we discussed and let me know if I can assist you in the future.   These are the goals we discussed: Goals    . Increase physical activity          Starting 02/14/17, I will continue to walk at least 10 min 2-3 days per week. When weather permits, I will attempt to walk additional days.        This is a list of the screening recommended for you and due dates:  Health Maintenance  Topic Date Due  . Mammogram  03/04/2026*  . Flu Shot  06/05/2017  . Tetanus Vaccine  07/21/2026  . DEXA scan (bone density measurement)  Completed  . Pneumonia vaccines  Completed  *Topic was postponed. The date shown is not the original due date.      Preventive Care for Adults  A healthy lifestyle and preventive care can promote health and wellness. Preventive health guidelines for adults include the following key practices.  . A routine yearly physical is a good way to check with your health care provider about your health and preventive screening. It is a chance to share any concerns and updates on your health and to receive a thorough exam.  . Visit your dentist for a routine exam and preventive care every 6 months. Brush your teeth twice a day and floss once a day. Good oral hygiene prevents tooth decay and gum disease.  . The frequency of eye exams is based on your age, health, family medical history, use  of contact lenses, and other factors. Follow your health care provider's ecommendations for frequency of eye exams.  . Eat a healthy diet. Foods like vegetables, fruits, whole grains, low-fat dairy products, and lean protein foods contain the nutrients you need without too many calories. Decrease your intake of foods high in solid fats, added sugars, and salt. Eat the right amount of calories for you. Get information about a  proper diet from your health care provider, if necessary.  . Regular physical exercise is one of the most important things you can do for your health. Most adults should get at least 150 minutes of moderate-intensity exercise (any activity that increases your heart rate and causes you to sweat) each week. In addition, most adults need muscle-strengthening exercises on 2 or more days a week.  Silver Sneakers may be a benefit available to you. To determine eligibility, you may visit the website: www.silversneakers.com or contact program at 4753436287 Mon-Fri between 8AM-8PM.   . Maintain a healthy weight. The body mass index (BMI) is a screening tool to identify possible weight problems. It provides an estimate of body fat based on height and weight. Your health care provider can find your BMI and can help you achieve or maintain a healthy weight.   For adults 20 years and older: ? A BMI below 18.5 is considered underweight. ? A BMI of 18.5 to 24.9 is normal. ? A BMI of 25 to 29.9 is considered overweight. ? A BMI of 30 and above is considered obese.   . Maintain normal blood lipids and cholesterol levels by exercising and minimizing your intake of saturated fat. Eat a balanced diet with plenty of fruit and vegetables. Blood tests for lipids and cholesterol should begin at age 39 and be repeated every 5 years. If your lipid  or cholesterol levels are high, you are over 50, or you are at high risk for heart disease, you may need your cholesterol levels checked more frequently. Ongoing high lipid and cholesterol levels should be treated with medicines if diet and exercise are not working.  . If you smoke, find out from your health care provider how to quit. If you do not use tobacco, please do not start.  . If you choose to drink alcohol, please do not consume more than 2 drinks per day. One drink is considered to be 12 ounces (355 mL) of beer, 5 ounces (148 mL) of wine, or 1.5 ounces (44 mL) of  liquor.  . If you are 1-65 years old, ask your health care provider if you should take aspirin to prevent strokes.  . Use sunscreen. Apply sunscreen liberally and repeatedly throughout the day. You should seek shade when your shadow is shorter than you. Protect yourself by wearing long sleeves, pants, a wide-brimmed hat, and sunglasses year round, whenever you are outdoors.  . Once a month, do a whole body skin exam, using a mirror to look at the skin on your back. Tell your health care provider of new moles, moles that have irregular borders, moles that are larger than a pencil eraser, or moles that have changed in shape or color.

## 2017-02-14 NOTE — Progress Notes (Signed)
Pre visit review using our clinic review tool, if applicable. No additional management support is needed unless otherwise documented below in the visit note. 

## 2017-02-14 NOTE — Progress Notes (Signed)
PCP notes:   Health maintenance:  Mammogram - pt states she is no longer required to do screening  Abnormal screenings:   Mini-Cog score: 18/20  Patient concerns:   None  Nurse concerns:  None  Next PCP appt:   02/27/17 @ 1000  I reviewed health advisor's note, was available for consultation on the day of service listed in this note, and agree with documentation and plan. Elsie Stain, MD.

## 2017-02-27 ENCOUNTER — Encounter: Payer: Self-pay | Admitting: Family Medicine

## 2017-02-27 ENCOUNTER — Ambulatory Visit (INDEPENDENT_AMBULATORY_CARE_PROVIDER_SITE_OTHER): Payer: Medicare Other | Admitting: Family Medicine

## 2017-02-27 VITALS — BP 126/78 | HR 92 | Temp 97.4°F | Ht 65.5 in | Wt 192.5 lb

## 2017-02-27 DIAGNOSIS — F17200 Nicotine dependence, unspecified, uncomplicated: Secondary | ICD-10-CM

## 2017-02-27 DIAGNOSIS — Z1231 Encounter for screening mammogram for malignant neoplasm of breast: Secondary | ICD-10-CM | POA: Diagnosis not present

## 2017-02-27 DIAGNOSIS — M79672 Pain in left foot: Secondary | ICD-10-CM | POA: Diagnosis not present

## 2017-02-27 DIAGNOSIS — E7800 Pure hypercholesterolemia, unspecified: Secondary | ICD-10-CM

## 2017-02-27 DIAGNOSIS — Z6831 Body mass index (BMI) 31.0-31.9, adult: Secondary | ICD-10-CM | POA: Diagnosis not present

## 2017-02-27 DIAGNOSIS — E6609 Other obesity due to excess calories: Secondary | ICD-10-CM | POA: Diagnosis not present

## 2017-02-27 DIAGNOSIS — I10 Essential (primary) hypertension: Secondary | ICD-10-CM | POA: Diagnosis not present

## 2017-02-27 DIAGNOSIS — M79671 Pain in right foot: Secondary | ICD-10-CM | POA: Diagnosis not present

## 2017-02-27 DIAGNOSIS — R739 Hyperglycemia, unspecified: Secondary | ICD-10-CM

## 2017-02-27 DIAGNOSIS — E2839 Other primary ovarian failure: Secondary | ICD-10-CM | POA: Insufficient documentation

## 2017-02-27 DIAGNOSIS — D751 Secondary polycythemia: Secondary | ICD-10-CM | POA: Diagnosis not present

## 2017-02-27 DIAGNOSIS — E0590 Thyrotoxicosis, unspecified without thyrotoxic crisis or storm: Secondary | ICD-10-CM | POA: Diagnosis not present

## 2017-02-27 MED ORDER — POTASSIUM CHLORIDE CRYS ER 20 MEQ PO TBCR: 20.0000 meq | EXTENDED_RELEASE_TABLET | Freq: Two times a day (BID) | ORAL | 3 refills | Status: DC

## 2017-02-27 MED ORDER — OMEPRAZOLE 20 MG PO CPDR: 20.0000 mg | DELAYED_RELEASE_CAPSULE | Freq: Two times a day (BID) | ORAL | 3 refills | Status: DC

## 2017-02-27 MED ORDER — HYDROCHLOROTHIAZIDE 25 MG PO TABS: 25.0000 mg | ORAL_TABLET | Freq: Every day | ORAL | 3 refills | Status: DC

## 2017-02-27 MED ORDER — GABAPENTIN 400 MG PO CAPS: 400.0000 mg | ORAL_CAPSULE | Freq: Two times a day (BID) | ORAL | 3 refills | Status: DC

## 2017-02-27 MED ORDER — ATORVASTATIN CALCIUM 10 MG PO TABS: 10.0000 mg | ORAL_TABLET | Freq: Every day | ORAL | 11 refills | Status: DC

## 2017-02-27 MED ORDER — FUROSEMIDE 20 MG PO TABS: ORAL_TABLET | ORAL | 3 refills | Status: DC

## 2017-02-27 NOTE — Patient Instructions (Addendum)
Stop at check out for mammogram referral and bone density test   Increase your calcium plus D to one pill twice daily   If you are interested in a shingles/zoster vaccine - call your insurance to check on coverage,( you should not get it within 1 month of other vaccines) , then call us for a prescription  for it to take to a pharmacy that gives the shot , or make a nurse visit to get it here depending on your coverage (Shingrix)  Your sugar is up  Try to eat less starchy food and sugar  Let your family know to be mindful when cooking for you   For cholesterol let''s try a small dose of lipitor  If any side effects-stop it and let me know  Labs in 6 weeks  Avoid red meat/ fried foods/ egg yolks/ fatty breakfast meats/ butter, cheese and high fat dairy/ and shellfish     Keep thinking about quitting smoking when you are ready

## 2017-02-27 NOTE — Progress Notes (Signed)
Subjective:    Patient ID: Regina Harris, female    DOB: [birth year], 81 y.o.   MRN: [id number]  HPI Here for annual f/u of chronic health problems   Doing ok with grief - and has bad and good days  She lost her husband  She stays with daughter 2 nights and goes out to eat frequently with family    Wt Readings from Last 3 Encounters:  02/27/17 192 lb 8 oz (87.3 kg)  02/14/17 193 lb 8 oz (87.8 kg)  07/31/16 205 lb 8 oz (93.2 kg)  feels better a little lighter now  Appetite is good - does not eat or cook as much as she used to  Eating healthier also  A little exercise- some walking - more when the weather gets better  bmi 31.5  Had AMW on 4/12 Mini cog score 18/20- unable to recall 1 of 3 words and follow one step of a command Is socializing a lot for memory  Does work search puzzles and reading  Does her own finances   Mammogram 9/15 nl  - needs to set one up  Self breast exam -no lumps   She has had a hysterectomy -no gyn symptoms at all   dexa 9/13-normal  Due for another  Has hyperthyroidism  Back to smoking some    Colon screening - colonoscopy 3/10 hyperplastic polyp Declines further colon cancer screening at this time   Shingles vaccine - may be interested if it is covered   Quit smoking 4/16 Started back 1ppd  Not ready to quit yet-perhaps in the future Did well quitting cold [place] in the past   bp is stable today  No cp or palpitations or headaches or edema  No side effects to medicines  BP Readings from Last 3 Encounters:  02/27/17 126/78  02/14/17 120/70  07/31/16 124/60     Hx of polycythemia in the past from smoking Lab Results  Component Value Date   WBC 8.5 02/14/2017   HGB 17.9 (H) 02/14/2017   HCT 52.7 (H) 02/14/2017   MCV 93.2 02/14/2017   PLT 223.0 02/14/2017    Hx of low tsh Was ref to endocrinology- getting [place] soon    Hx of hyperglycemia Lab Results  Component Value Date   HGBA1C 6.3 02/14/2017  this is up from 6.1  She  eats a lot of mac and cheese Eating out  Pasta / family brings in food    Hx of hyperlipidemia Lab Results  Component Value Date   CHOL 200 02/14/2017   CHOL 187 03/16/2015   CHOL 190 02/15/2014   Lab Results  Component Value Date   HDL 51.40 02/14/2017   HDL 53.40 03/16/2015   HDL 47.00 02/15/2014   Lab Results  Component Value Date   LDLCALC 123 (H) 02/14/2017   LDLCALC 109 (H) 03/16/2015   LDLCALC 122 (H) 02/15/2014   Lab Results  Component Value Date   TRIG 127.0 02/14/2017   TRIG 124.0 03/16/2015   TRIG 104.0 02/15/2014   Lab Results  Component Value Date   CHOLHDL 4 02/14/2017   CHOLHDL 4 03/16/2015   CHOLHDL 4 02/15/2014   No results found for: LDLDIRECT   LDL is up from 109 to 123  Is open to cholesterol medicine   Patient Active Problem List   Diagnosis Date Noted  . Encounter for screening mammogram for breast cancer 02/27/2017  . Estrogen deficiency 02/27/2017  . Foot pain, bilateral 02/27/2017  . Subclinical  hyperthyroidism 04/16/2016  . Pedal edema 04/05/2016  . Right rotator cuff tear 03/09/2016  . Hypokalemia 03/01/2016  . Nodule of chest wall 10/06/2014  . Left breast mass 06/30/2014  . Encounter for Medicare annual wellness exam 02/22/2014  . Polycythemia, secondary 02/22/2014  . GERD (gastroesophageal reflux disease) 08/24/2013  . Lump or mass in breast 05/06/2013  . Post-menopausal 06/24/2012  . Obesity 12/24/2011  . Hyperglycemia 03/12/2011  . NEOPLASM UNSPECIFIED NATURE DIGESTIVE SYSTEM 07/24/2010  . LUNG NODULE 01/16/2010  . HYPERCHOLESTEROLEMIA, PURE 07/10/2007  . Smoker 03/19/2007  . Essential hypertension 03/19/2007  . Carotid bruit 03/19/2007  . COLONIC POLYPS, HX OF 03/19/2007   Past Medical History:  Diagnosis Date  . Barrett esophagus   . Carotid bruit   . Diabetes mellitus without complication ([place])   . Esophagitis   . Gastritis 2013  . GERD (gastroesophageal reflux disease)   . HH (hiatus hernia)   . History of  colonic polyps   . History of repair of right rotator cuff   . Hyperlipidemia   . Hypertension   . Lump or mass in breast   . Pneumonia   . Polycythemia, secondary   . Tobacco abuse    Past Surgical History:  Procedure Laterality Date  . ABDOMINAL HYSTERECTOMY    . APPENDECTOMY    . BREAST BIOPSY Right 1996  . BREAST BIOPSY Left 10/09/2012   Benign breast tissue with focal fat necrosis and focal periductal chronic inflammation.  Marland Kitchen BREAST BIOPSY Left 10/09/2012   Benign breast tissue with focal fat necrosis and focal periductal chronic inflammation.  Marland Kitchen CATARACT EXTRACTION  3/11   Dr Charise Killian  . CHOLECYSTECTOMY    . COLONOSCOPY  2009  . ESOPHAGOGASTRODUODENOSCOPY (EGD) WITH PROPOFOL N/A 11/02/2015   Procedure: ESOPHAGOGASTRODUODENOSCOPY (EGD) WITH PROPOFOL;  Surgeon: Manya Silvas, MD;  Location[facility] ENDOSCOPY;  Service: Endoscopy;  Laterality: N/A;  . SHOULDER ARTHROSCOPY WITH OPEN ROTATOR CUFF REPAIR Right 03/05/2016   Procedure: SHOULDER ARTHROSCOPY WITH OPEN ROTATOR CUFF REPAIR;  Surgeon: Earnestine Leys, MD;  Location: [facility] ORS;  Service: Orthopedics;  Laterality: Right;  . UPPER GI ENDOSCOPY  2013   Social History  Substance Use Topics  . Smoking status: Current Every Day Smoker    Packs/day: 1.00    Types: Cigarettes    Start date: 02/04/2015    Last attempt to quit: 02/04/2015  . Smokeless tobacco: Never Used  . Alcohol use No   Family History  Problem Relation Age of Onset  . Kidney cancer Father   . Diabetes Mother    No Known Allergies Current Outpatient Prescriptions on File Prior to Visit  Medication Sig Dispense Refill  . aspirin EC 81 MG tablet Take 81 mg by mouth daily.    Marland Kitchen CALCIUM-VITAMIN D PO Take 1 tablet by mouth 2 (two) times daily.     . [DISCONTINUED] potassium chloride (K-DUR) 10 MEQ tablet Take 1 tablet (10 mEq total) by mouth daily. 30 tablet 3   No current facility-administered medications on file prior to visit.     Review of Systems Review of  Systems  Constitutional: Negative for fever, appetite change, fatigue and unexpected weight change.  Eyes: Negative for pain and visual disturbance.  Respiratory: Negative for cough and shortness of breath.   Cardiovascular: Negative for cp or palpitations    Gastrointestinal: Negative for nausea, diarrhea and constipation.  Genitourinary: Negative for urgency and frequency.  Skin: Negative for pallor or rash   MSK pos for pain and loss  of rom of R shoulder- chronic  Neurological: Negative for weakness, light-headedness, numbness and headaches.  Hematological: Negative for adenopathy. Does not bruise/bleed easily.  Psychiatric/Behavioral: Negative for dysphoric mood. The patient is not nervous/anxious.  f pos for grie       Objective:   Physical Exam  Constitutional: She appears well-developed and well-nourished. No distress.  obese and well appearing   HENT:  Head: Normocephalic and atraumatic.  Mouth/Throat: Oropharynx is clear and moist.  Eyes: Conjunctivae and EOM are normal. Pupils are equal, round, and reactive to light.  Neck: Normal range of motion. Neck supple. No JVD present. Carotid bruit is present. No thyromegaly present.  Cardiovascular: Normal rate, regular rhythm, normal heart sounds and intact distal pulses.  Exam reveals no gallop.   Pulmonary/Chest: Effort normal and breath sounds normal. No respiratory distress. She has no wheezes. She has no rales.  No crackles  Diffusely distant bs Good air exch  Abdominal: Soft. Bowel sounds are normal. She exhibits no distension, no abdominal bruit and no mass. There is no tenderness.  Genitourinary:  Genitourinary Comments: Breast exam: No mass, nodules, thickening, tenderness, bulging, retraction, inflamation, nipple discharge or skin changes noted.  No axillary or clavicular LA.      Musculoskeletal: She exhibits no edema.  Lymphadenopathy:    She has no cervical adenopathy.  Neurological: She is alert. She has normal  reflexes.  Skin: Skin is warm and dry. No rash noted. No pallor.  sks and skin tags diffusely  Psychiatric: She has a normal mood and affect.          Assessment & Plan:   Problem List Items Addressed This Visit      Cardiovascular and Mediastinum   Essential hypertension - Primary    bp in fair control at this time  BP Readings from Last 1 Encounters:  02/27/17 126/78   No changes needed Disc lifstyle change with low sodium diet and exercise  Labs reviewed        Relevant Medications   atorvastatin (LIPITOR) 10 MG tablet   furosemide (LASIX) 20 MG tablet   hydrochlorothiazide (HYDRODIURIL) 25 MG tablet     Endocrine   Subclinical hyperthyroidism    Planning thyroid ultrasound and f/u with endocrinologist  Low tsh  Lab Results  Component Value Date   TSH 0.10 (L) 02/14/2017   Nl FT4        Other   Encounter for screening mammogram for breast cancer    Scheduled annual screening mammogram Nl breast exam today  Encouraged monthly self exams        Relevant Orders   MM DIGITAL SCREENING BILATERAL   Estrogen deficiency   Relevant Orders   DG Bone Density   Foot pain, bilateral    We will take over gabapentin prev px by Dr Cordie Grice, PURE    Disc goals for lipids and reasons to control them Rev labs with pt Rev low sat fat diet in detail  LDL going up  Will start 10 mg of lipitor Update if any side eff like muscle pain  Re check lipids in 6 wk Rev diet       Relevant Medications   atorvastatin (LIPITOR) 10 MG tablet   furosemide (LASIX) 20 MG tablet   hydrochlorothiazide (HYDRODIURIL) 25 MG tablet   Hyperglycemia    Lab Results  Component Value Date   HGBA1C 6.3 02/14/2017   Up from 6.1 disc imp of low glycemic diet  and wt loss to prevent DM2  Handout on diet given      Obesity    Commended on wt loss so far  Discussed how this problem influences overall health and the risks it imposes  Reviewed plan for weight  loss with lower calorie diet (via better food choices and also portion control or program like weight watchers) and exercise building up to or more than 30 minutes 5 days per week including some aerobic activity         Polycythemia, secondary    Due to smoking Cbc rev-overall stable No symptoms  Continue to follow      Smoker    Unfortunately has resumed smoking Disc in detail risks of smoking and possible outcomes including copd, vascular/ heart disease, cancer , respiratory and sinus infections  Pt voices understanding

## 2017-02-27 NOTE — Progress Notes (Signed)
Pre visit review using our clinic review tool, if applicable. No additional management support is needed unless otherwise documented below in the visit note. 

## 2017-02-28 NOTE — Assessment & Plan Note (Signed)
Planning thyroid ultrasound and f/u with endocrinologist  Low tsh  Lab Results  Component Value Date   TSH 0.10 (L) 02/14/2017   Nl FT4

## 2017-02-28 NOTE — Assessment & Plan Note (Signed)
Commended on wt loss so far Discussed how this problem influences overall health and the risks it imposes  Reviewed plan for weight loss with lower calorie diet (via better food choices and also portion control or program like weight watchers) and exercise building up to or more than 30 minutes 5 days per week including some aerobic activity    

## 2017-02-28 NOTE — Assessment & Plan Note (Signed)
Disc goals for lipids and reasons to control them Rev labs with pt Rev low sat fat diet in detail  LDL going up  Will start 10 mg of lipitor Update if any side eff like muscle pain  Re check lipids in 6 wk Rev diet

## 2017-02-28 NOTE — Assessment & Plan Note (Signed)
We will take over gabapentin prev px by Dr Sabra Heck

## 2017-02-28 NOTE — Assessment & Plan Note (Signed)
Due to smoking Cbc rev-overall stable No symptoms  Continue to follow

## 2017-02-28 NOTE — Assessment & Plan Note (Signed)
Lab Results  Component Value Date   HGBA1C 6.3 02/14/2017   Up from 6.1 disc imp of low glycemic diet and wt loss to prevent DM2  Handout on diet given

## 2017-02-28 NOTE — Assessment & Plan Note (Signed)
bp in fair control at this time  BP Readings from Last 1 Encounters:  02/27/17 126/78   No changes needed Disc lifstyle change with low sodium diet and exercise  Labs reviewed

## 2017-02-28 NOTE — Assessment & Plan Note (Signed)
Unfortunately has resumed smoking Disc in detail risks of smoking and possible outcomes including copd, vascular/ heart disease, cancer , respiratory and sinus infections  Pt voices understanding

## 2017-02-28 NOTE — Assessment & Plan Note (Signed)
Scheduled annual screening mammogram Nl breast exam today  Encouraged monthly self exams   

## 2017-03-03 ENCOUNTER — Other Ambulatory Visit: Payer: Self-pay | Admitting: Family Medicine

## 2017-03-20 ENCOUNTER — Other Ambulatory Visit: Payer: Self-pay | Admitting: Family Medicine

## 2017-03-20 DIAGNOSIS — I6523 Occlusion and stenosis of bilateral carotid arteries: Secondary | ICD-10-CM

## 2017-03-27 ENCOUNTER — Ambulatory Visit: Payer: Medicare Other

## 2017-03-27 DIAGNOSIS — I6523 Occlusion and stenosis of bilateral carotid arteries: Secondary | ICD-10-CM | POA: Diagnosis not present

## 2017-03-29 LAB — VAS US CAROTID
LCCAPDIAS: 31 cm/s
LEFT ECA DIAS: -19 cm/s
LEFT VERTEBRAL DIAS: -7 cm/s
LICAPDIAS: 47 cm/s
Left CCA dist dias: 25 cm/s
Left CCA dist sys: 92 cm/s
Left CCA prox sys: 120 cm/s
Left ICA dist dias: -32 cm/s
Left ICA dist sys: -87 cm/s
Left ICA prox sys: 242 cm/s
RCCAPDIAS: 25 cm/s
RCCAPSYS: 111 cm/s
RIGHT CCA MID DIAS: -19 cm/s
RIGHT ECA DIAS: -15 cm/s
RIGHT VERTEBRAL DIAS: -15 cm/s
Right cca dist sys: -85 cm/s

## 2017-04-15 ENCOUNTER — Other Ambulatory Visit: Payer: Medicare Other

## 2017-04-15 ENCOUNTER — Ambulatory Visit: Payer: Medicare Other

## 2017-04-25 DIAGNOSIS — Z961 Presence of intraocular lens: Secondary | ICD-10-CM | POA: Diagnosis not present

## 2017-05-23 ENCOUNTER — Ambulatory Visit
Admission: RE | Admit: 2017-05-23 | Discharge: 2017-05-23 | Disposition: A | Discharge status: Home / Self Care | Payer: Medicare Other | Source: Ambulatory Visit | Attending: Family Medicine | Admitting: Family Medicine

## 2017-05-23 DIAGNOSIS — E2839 Other primary ovarian failure: Secondary | ICD-10-CM

## 2017-05-23 DIAGNOSIS — Z13820 Encounter for screening for osteoporosis: Secondary | ICD-10-CM | POA: Diagnosis not present

## 2017-05-23 DIAGNOSIS — Z780 Asymptomatic menopausal state: Secondary | ICD-10-CM | POA: Diagnosis not present

## 2017-05-23 DIAGNOSIS — Z1231 Encounter for screening mammogram for malignant neoplasm of breast: Secondary | ICD-10-CM

## 2017-05-23 LAB — HM DEXA SCAN: HM DEXA SCAN: NORMAL

## 2017-05-24 ENCOUNTER — Encounter: Payer: Self-pay | Admitting: *Deleted

## 2017-05-27 ENCOUNTER — Encounter: Payer: Self-pay | Admitting: *Deleted

## 2017-06-25 DIAGNOSIS — H26491 Other secondary cataract, right eye: Secondary | ICD-10-CM | POA: Diagnosis not present

## 2017-06-25 DIAGNOSIS — H353131 Nonexudative age-related macular degeneration, bilateral, early dry stage: Secondary | ICD-10-CM | POA: Diagnosis not present

## 2017-06-25 DIAGNOSIS — H25812 Combined forms of age-related cataract, left eye: Secondary | ICD-10-CM | POA: Diagnosis not present

## 2017-06-26 DIAGNOSIS — H25812 Combined forms of age-related cataract, left eye: Secondary | ICD-10-CM | POA: Diagnosis not present

## 2017-06-26 DIAGNOSIS — H2512 Age-related nuclear cataract, left eye: Secondary | ICD-10-CM | POA: Diagnosis not present

## 2017-08-07 ENCOUNTER — Other Ambulatory Visit: Payer: Self-pay | Admitting: Family Medicine

## 2017-10-18 ENCOUNTER — Telehealth: Payer: Self-pay | Admitting: Family Medicine

## 2017-10-18 NOTE — Telephone Encounter (Signed)
Granddaughter Investment banker, corporate) advised.

## 2017-10-18 NOTE — Telephone Encounter (Signed)
I cannot treat that over the phone w/o examination  Drink fluids and use nasal saline spray for congestion  If you tolerate expectorants - mucinex or robitussin may help loosen the congestion  Breathe steam also  If symptoms become severe over the weekend - alert [place] Otherwise schedule appt early next week to be seen

## 2017-10-18 NOTE — Telephone Encounter (Signed)
Copied from [place] [phone]. Topic: Quick Communication - See Telephone Encounter >> Oct 18, 2017  3:21 PM Aurelio Brash B wrote: CRM for notification. See Telephone encounter for:  PT's daughter  would like to have a call from the Dr Glori Bickers or the nurse or have Dr Glori Bickers call in something for a sinus infection.   [facility]   10/18/17. Daughter is asking for a call back to let her knowHoyle Sauer [phone]

## 2017-10-18 NOTE — Telephone Encounter (Signed)
I spoke to [place] and pt does not drive further than [place] and pt has head congestion and when blows nose there is green mucus; no fever; no more cough than usual; no SOB, H/A or dizziness. Hoyle Sauer thinks sinus infection. Carolyn request cb. [email or web address]. Pt last seen annual 048/25/18.

## 2017-11-15 DIAGNOSIS — J00 Acute nasopharyngitis [common cold]: Secondary | ICD-10-CM | POA: Diagnosis not present

## 2017-11-18 ENCOUNTER — Ambulatory Visit: Payer: Medicare Other | Admitting: Family Medicine

## 2017-11-19 ENCOUNTER — Encounter: Payer: Self-pay | Admitting: Family Medicine

## 2017-11-19 ENCOUNTER — Ambulatory Visit (INDEPENDENT_AMBULATORY_CARE_PROVIDER_SITE_OTHER): Payer: Medicare Other | Admitting: Family Medicine

## 2017-11-19 VITALS — BP 126/82 | HR 84 | Temp 97.6°F | Ht 65.5 in | Wt 201.5 lb

## 2017-11-19 DIAGNOSIS — J069 Acute upper respiratory infection, unspecified: Secondary | ICD-10-CM | POA: Diagnosis not present

## 2017-11-19 MED ORDER — FLUTICASONE PROPIONATE 50 MCG/ACT NA SUSP: 2.0000 | Freq: Every day | NASAL | 6 refills | Status: DC

## 2017-11-19 NOTE — Progress Notes (Signed)
Subjective:    Patient ID: Regina Harris, female    DOB: Dec 28, 1935, 82 y.o.   MRN: [id number]  HPI Here for symptom of congestion   Thinks she may have a head cold   Went to a walk in clinic last Friday  Given atrovent nasal spray  Diagnosed with a viral cold   Symptoms are not better or worse   Very congested  All the nasal d/c is clear  No sinus pressure or pain  No otc meds or antihistamines  Ears feel ok -no pain or pressure  Throat - sometimes a little sore/mild   Breathing is about the same  Cough- not much/ just occasional - not a lot of chest congestion   Does not think she has had a fever    Temp: 97.6 F (36.4 C)  Pulse ox 955 on RA  Patient Active Problem List   Diagnosis Date Noted  . Viral URI 11/19/2017  . Encounter for screening mammogram for breast cancer 02/27/2017  . Estrogen deficiency 02/27/2017  . Foot pain, bilateral 02/27/2017  . Subclinical hyperthyroidism 04/16/2016  . Pedal edema 04/05/2016  . Right rotator cuff tear 03/09/2016  . Hypokalemia 03/01/2016  . Nodule of chest wall 10/06/2014  . Left breast mass 06/30/2014  . Encounter for Medicare annual wellness exam 02/22/2014  . Polycythemia, secondary 02/22/2014  . GERD (gastroesophageal reflux disease) 08/24/2013  . Lump or mass in breast 05/06/2013  . Post-menopausal 06/24/2012  . Obesity 12/24/2011  . Hyperglycemia 03/12/2011  . NEOPLASM UNSPECIFIED NATURE DIGESTIVE SYSTEM 07/24/2010  . LUNG NODULE 01/16/2010  . HYPERCHOLESTEROLEMIA, PURE 07/10/2007  . Smoker 03/19/2007  . Essential hypertension 03/19/2007  . Carotid bruit 03/19/2007  . COLONIC POLYPS, HX OF 03/19/2007   Past Medical History:  Diagnosis Date  . Barrett esophagus   . Carotid bruit   . Diabetes mellitus without complication ([place])   . Esophagitis   . Gastritis 2013  . GERD (gastroesophageal reflux disease)   . HH (hiatus hernia)   . History of colonic polyps   . History of repair of right rotator cuff     . Hyperlipidemia   . Hypertension   . Lump or mass in breast   . Pneumonia   . Polycythemia, secondary   . Tobacco abuse    Past Surgical History:  Procedure Laterality Date  . ABDOMINAL HYSTERECTOMY    . APPENDECTOMY    . BREAST BIOPSY Right 1996  . BREAST BIOPSY Left 10/09/2012   Benign breast tissue with focal fat necrosis and focal periductal chronic inflammation.  Marland Kitchen BREAST BIOPSY Left 10/09/2012   Benign breast tissue with focal fat necrosis and focal periductal chronic inflammation.  Marland Kitchen CATARACT EXTRACTION  3/11   Dr Charise Killian  . CHOLECYSTECTOMY    . COLONOSCOPY  2009  . ESOPHAGOGASTRODUODENOSCOPY (EGD) WITH PROPOFOL N/A 11/02/2015   Procedure: ESOPHAGOGASTRODUODENOSCOPY (EGD) WITH PROPOFOL;  Surgeon: Manya Silvas, MD;  Location: [facility] ENDOSCOPY;  Service: Endoscopy;  Laterality: N/A;  . SHOULDER ARTHROSCOPY WITH OPEN ROTATOR CUFF REPAIR Right 03/05/2016   Procedure: SHOULDER ARTHROSCOPY WITH OPEN ROTATOR CUFF REPAIR;  Surgeon: Earnestine Leys, MD;  Location: [facility] ORS;  Service: Orthopedics;  Laterality: Right;  . UPPER GI ENDOSCOPY  2013   Social History   Tobacco Use  . Smoking status: Current Every Day Smoker    Packs/day: 1.00    Types: Cigarettes  . Smokeless tobacco: Never Used  Substance Use Topics  . Alcohol use: No    Alcohol/week:  0.0 oz  . Drug use: No   Family History  Problem Relation Age of Onset  . Kidney cancer Father   . Diabetes Mother   . Breast cancer Neg Hx    No Known Allergies Current Outpatient Medications on File Prior to Visit  Medication Sig Dispense Refill  . aspirin EC 81 MG tablet Take 81 mg by mouth daily.    Marland Kitchen atorvastatin (LIPITOR) 10 MG tablet Take 1 tablet (10 mg total) by mouth daily. 30 tablet 11  . CALCIUM-VITAMIN D PO Take 1 tablet by mouth 2 (two) times daily.     . furosemide (LASIX) 20 MG tablet TAKE 1 TABLET (20 MG TOTAL) BY MOUTH DAILY. 90 tablet 3  . gabapentin (NEURONTIN) 400 MG capsule Take 1 capsule (400 mg total) by  mouth 2 (two) times daily. 180 capsule 3  . hydrochlorothiazide (HYDRODIURIL) 25 MG tablet TAKE 1 TABLET DAILY 90 tablet 1  . ipratropium (ATROVENT) 0.06 % nasal spray Place 2 sprays into both nostrils 3 (three) times daily.    Marland Kitchen omeprazole (PRILOSEC) 20 MG capsule Take 1 capsule (20 mg total) by mouth 2 (two) times daily. 180 capsule 3  . potassium chloride SA (K-DUR,KLOR-CON) 20 MEQ tablet Take 1 tablet (20 mEq total) by mouth 2 (two) times daily. 180 tablet 3  . [DISCONTINUED] potassium chloride (K-DUR) 10 MEQ tablet Take 1 tablet (10 mEq total) by mouth daily. 30 tablet 3   No current facility-administered medications on file prior to visit.      Review of Systems  Constitutional: Positive for appetite change and fatigue. Negative for fever.  HENT: Positive for congestion, postnasal drip, rhinorrhea, sinus pressure, sneezing and sore throat. Negative for ear pain.   Eyes: Negative for pain and discharge.  Respiratory: Positive for cough. Negative for shortness of breath, wheezing and stridor.   Cardiovascular: Negative for chest pain.  Gastrointestinal: Negative for diarrhea, nausea and vomiting.  Genitourinary: Negative for frequency, hematuria and urgency.  Musculoskeletal: Negative for arthralgias and myalgias.  Skin: Negative for rash.  Neurological: Positive for headaches. Negative for dizziness, weakness and light-headedness.  Psychiatric/Behavioral: Negative for confusion and dysphoric mood.       Objective:   Physical Exam  Constitutional: She appears well-developed and well-nourished. No distress.  overwt and well app  HENT:  Head: Normocephalic and atraumatic.  Right Ear: External ear normal.  Left Ear: External ear normal.  Mouth/Throat: Oropharynx is clear and moist.  Nares are injected and congested  No sinus tenderness Clear rhinorrhea and post nasal drip   Eyes: Conjunctivae and EOM are normal. Pupils are equal, round, and reactive to light. Right eye exhibits  no discharge. Left eye exhibits no discharge.  Neck: Normal range of motion. Neck supple.  Cardiovascular: Normal rate and normal heart sounds.  Pulmonary/Chest: Effort normal and breath sounds normal. No respiratory distress. She has no wheezes. She has no rales. She exhibits no tenderness.  Cough sounds wet  occ clears throat   Good air exch Diffusely distant bs  No rales or rhonchi  Lymphadenopathy:    She has no cervical adenopathy.  Neurological: She is alert.  Skin: Skin is warm and dry. No rash noted.  Psychiatric: She has a normal mood and affect.          Assessment & Plan:   Problem List Items Addressed This Visit      Respiratory   Viral URI - Primary    Disc symptomatic care and handout given Reassuring  exam  Continue atrovent ns  Add flonase ns  Fluids/rest  Add zyrtec for rhinorrhea otc 10 mg daily  Watch for facial pain/ fever or worse cough or wheeze Update if not starting to improve in a week or if worsening   Enc smoking cessatoin

## 2017-11-19 NOTE — Patient Instructions (Addendum)
I think you are working through a bad cold  Continue the atrovent nasal spray (from urgent care) if it helps I sent in flonase nasal spray to use once daily as directed as well (I sent it to your pharmacy)  Also please get zyrtec 10 mg and take one pill daily as needed for runny nose   These three things should help while it runs its course  A cold can last 2-3 weeks   Update if not starting to improve in a week or if worsening    Get rest and drink fluids     Upper Respiratory Infection, Adult Most upper respiratory infections (URIs) are caused by a virus. A URI affects the nose, throat, and upper air passages. The most common type of URI is often called "the common cold." Follow these instructions at home:  Take medicines only as told by your doctor.  Gargle warm saltwater or take cough drops to comfort your throat as told by your doctor.  Use a warm mist humidifier or inhale steam from a shower to increase air moisture. This may make it easier to breathe.  Drink enough fluid to keep your pee (urine) clear or pale yellow.  Eat soups and other clear broths.  Have a healthy diet.  Rest as needed.  Go back to work when your fever is gone or your doctor says it is okay. ? You may need to stay home longer to avoid giving your URI to others. ? You can also wear a face mask and wash your hands often to prevent spread of the virus.  Use your inhaler more if you have asthma.  Do not use any tobacco products, including cigarettes, chewing tobacco, or electronic cigarettes. If you need help quitting, ask your doctor. Contact a doctor if:  You are getting worse, not better.  Your symptoms are not helped by medicine.  You have chills.  You are getting more short of breath.  You have brown or red mucus.  You have yellow or brown discharge from your nose.  You have pain in your face, especially when you bend forward.  You have a fever.  You have puffy (swollen) neck  glands.  You have pain while swallowing.  You have white areas in the back of your throat. Get help right away if:  You have very bad or constant: ? Headache. ? Ear pain. ? Pain in your forehead, behind your eyes, and over your cheekbones (sinus pain). ? Chest pain.  You have long-lasting (chronic) lung disease and any of the following: ? Wheezing. ? Long-lasting cough. ? Coughing up blood. ? A change in your usual mucus.  You have a stiff neck.  You have changes in your: ? Vision. ? Hearing. ? Thinking. ? Mood. This information is not intended to replace advice given to you by your health care provider. Make sure you discuss any questions you have with your health care provider. Document Released: 04/09/2008 Document Revised: 06/24/2016 Document Reviewed: 01/27/2014 Elsevier Interactive Patient Education  2018 [employer].

## 2017-11-20 ENCOUNTER — Encounter: Payer: Self-pay | Admitting: Family Medicine

## 2017-11-20 NOTE — Assessment & Plan Note (Addendum)
Disc symptomatic care and handout given Reassuring exam  Continue atrovent ns  Add flonase ns  Fluids/rest  Add zyrtec for rhinorrhea otc 10 mg daily  Watch for facial pain/ fever or worse cough or wheeze Update if not starting to improve in a week or if worsening   Enc smoking cessatoin

## 2017-11-22 ENCOUNTER — Other Ambulatory Visit: Payer: Self-pay | Admitting: *Deleted

## 2017-11-22 DIAGNOSIS — I6523 Occlusion and stenosis of bilateral carotid arteries: Secondary | ICD-10-CM

## 2018-01-13 ENCOUNTER — Other Ambulatory Visit: Payer: Self-pay | Admitting: Family Medicine

## 2018-01-15 ENCOUNTER — Other Ambulatory Visit: Payer: Self-pay | Admitting: Family Medicine

## 2018-02-16 ENCOUNTER — Telehealth: Payer: Self-pay | Admitting: Family Medicine

## 2018-02-16 DIAGNOSIS — R739 Hyperglycemia, unspecified: Secondary | ICD-10-CM

## 2018-02-16 DIAGNOSIS — E7800 Pure hypercholesterolemia, unspecified: Secondary | ICD-10-CM

## 2018-02-16 DIAGNOSIS — I10 Essential (primary) hypertension: Secondary | ICD-10-CM

## 2018-02-16 DIAGNOSIS — E0590 Thyrotoxicosis, unspecified without thyrotoxic crisis or storm: Secondary | ICD-10-CM

## 2018-02-16 NOTE — Telephone Encounter (Signed)
-----   Message from Eustace Pen, LPN sent at 12/31/3333 11:34 AM EDT ----- Regarding: Labs 4/15 Lab orders needed. Thank you.  Insurance:  [employer]

## 2018-02-17 ENCOUNTER — Ambulatory Visit (INDEPENDENT_AMBULATORY_CARE_PROVIDER_SITE_OTHER): Payer: Medicare Other

## 2018-02-17 VITALS — BP 118/70 | HR 98 | Temp 97.5°F | Ht 65.75 in | Wt 204.2 lb

## 2018-02-17 DIAGNOSIS — Z0000 Encounter for general adult medical examination without abnormal findings: Secondary | ICD-10-CM

## 2018-02-17 DIAGNOSIS — E7800 Pure hypercholesterolemia, unspecified: Secondary | ICD-10-CM

## 2018-02-17 DIAGNOSIS — E0590 Thyrotoxicosis, unspecified without thyrotoxic crisis or storm: Secondary | ICD-10-CM

## 2018-02-17 DIAGNOSIS — R739 Hyperglycemia, unspecified: Secondary | ICD-10-CM | POA: Diagnosis not present

## 2018-02-17 DIAGNOSIS — I10 Essential (primary) hypertension: Secondary | ICD-10-CM

## 2018-02-17 LAB — CBC WITH DIFFERENTIAL/PLATELET
Basophils Absolute: 0.1 10*3/uL (ref 0.0–0.1)
Basophils Relative: 0.6 % (ref 0.0–3.0)
EOS PCT: 1.3 % (ref 0.0–5.0)
Eosinophils Absolute: 0.1 10*3/uL (ref 0.0–0.7)
HCT: 52.2 % — ABNORMAL HIGH (ref 36.0–46.0)
HEMOGLOBIN: 17.8 g/dL — AB (ref 12.0–15.0)
Lymphocytes Relative: 18.7 % (ref 12.0–46.0)
Lymphs Abs: 1.5 10*3/uL (ref 0.7–4.0)
MCHC: 34.2 g/dL (ref 30.0–36.0)
MCV: 94.7 fl (ref 78.0–100.0)
MONO ABS: 0.7 10*3/uL (ref 0.1–1.0)
Monocytes Relative: 8.6 % (ref 3.0–12.0)
Neutro Abs: 5.7 10*3/uL (ref 1.4–7.7)
Neutrophils Relative %: 70.8 % (ref 43.0–77.0)
Platelets: 210 10*3/uL (ref 150.0–400.0)
RBC: 5.51 Mil/uL — AB (ref 3.87–5.11)
RDW: 12.8 % (ref 11.5–15.5)
WBC: 8 10*3/uL (ref 4.0–10.5)

## 2018-02-17 LAB — COMPREHENSIVE METABOLIC PANEL
ALK PHOS: 63 U/L (ref 39–117)
ALT: 12 U/L (ref 0–35)
AST: 14 U/L (ref 0–37)
Albumin: 3.8 g/dL (ref 3.5–5.2)
BUN: 17 mg/dL (ref 6–23)
CALCIUM: 10 mg/dL (ref 8.4–10.5)
CO2: 29 mEq/L (ref 19–32)
Chloride: 103 mEq/L (ref 96–112)
Creatinine, Ser: 0.88 mg/dL (ref 0.40–1.20)
GFR: 65.71 mL/min (ref >60.00)
Glucose, Bld: 121 mg/dL — ABNORMAL HIGH (ref 70–99)
POTASSIUM: 4.1 meq/L (ref 3.5–5.1)
Sodium: 140 mEq/L (ref 135–145)
TOTAL PROTEIN: 7 g/dL (ref 6.0–8.3)
Total Bilirubin: 0.5 mg/dL (ref 0.2–1.2)

## 2018-02-17 LAB — HEMOGLOBIN A1C: Hgb A1c MFr Bld: 6.5 % (ref 4.6–6.5)

## 2018-02-17 LAB — LIPID PANEL
CHOLESTEROL: 134 mg/dL (ref 0–200)
HDL: 45.9 mg/dL (ref >39.00)
LDL CALC: 69 mg/dL (ref 0–99)
NonHDL: 87.89
TRIGLYCERIDES: 94 mg/dL (ref 0.0–149.0)
Total CHOL/HDL Ratio: 3
VLDL: 18.8 mg/dL (ref 0.0–40.0)

## 2018-02-17 LAB — TSH: TSH: 0.11 u[IU]/mL — ABNORMAL LOW (ref 0.35–4.50)

## 2018-02-17 NOTE — Progress Notes (Signed)
Subjective:   Regina Harris is a 82 y.o. female who presents for Medicare Annual (Subsequent) preventive examination.  Review of Systems:  N/A Cardiac Risk Factors include: advanced age (>79men, >53 women);obesity (BMI >30kg/m2);dyslipidemia;hypertension     Objective:     Vitals: BP 118/70 (BP Location: Right Arm, Patient Position: Sitting, Cuff Size: Normal)   Pulse 98   Temp (!) 97.5 F (36.4 C) (Oral)   Ht 5' 5.75" (1.67 m) Comment: no shoes  Wt 204 lb 4 oz (92.6 kg)   SpO2 94%   BMI 33.22 kg/m   Body mass index is 33.22 kg/m.  Advanced Directives 02/17/2018 02/14/2017 07/21/2016 02/27/2016 11/02/2015  Does Patient Have a Medical Advance Directive? Yes Yes No No No  Type of Advance Directive Living will [place];Living will - - -  Copy of [place] in Chart? - No - copy requested - - -  Would patient like information on creating a medical advance directive? - - No - patient declined information No - patient declined information No - patient declined information    Tobacco Social History   Tobacco Use  Smoking Status Current Every Day Smoker  . Packs/day: 1.00  . Types: Cigarettes  Smokeless Tobacco Never Used     Ready to quit: No Counseling given: No   Clinical Intake:  Pre-visit preparation completed: Yes  Pain : No/denies pain Pain Score: 0-No pain     Nutritional Status: BMI > 30  Obese Nutritional Risks: None Diabetes: No  How often do you need to have someone help you when you read instructions, pamphlets, or other written materials from your doctor or pharmacy?: 1 - Never What is the last grade level you completed in school?: 12th grade  Interpreter Needed?: No  Comments: pt is a widow and lives alone Information entered by :: LPinson, LPN  Past Medical History:  Diagnosis Date  . Barrett esophagus   . Carotid bruit   . Diabetes mellitus without complication ([place])   . Esophagitis   . Gastritis 2013    . GERD (gastroesophageal reflux disease)   . HH (hiatus hernia)   . History of colonic polyps   . History of repair of right rotator cuff   . Hyperlipidemia   . Hypertension   . Lump or mass in breast   . Pneumonia   . Polycythemia, secondary   . Tobacco abuse    Past Surgical History:  Procedure Laterality Date  . ABDOMINAL HYSTERECTOMY    . APPENDECTOMY    . BREAST BIOPSY Right 1996  . BREAST BIOPSY Left 10/09/2012   Benign breast tissue with focal fat necrosis and focal periductal chronic inflammation.  Marland Kitchen BREAST BIOPSY Left 10/09/2012   Benign breast tissue with focal fat necrosis and focal periductal chronic inflammation.  Marland Kitchen CATARACT EXTRACTION  3/11   Dr Charise Killian  . CHOLECYSTECTOMY    . COLONOSCOPY  2009  . ESOPHAGOGASTRODUODENOSCOPY (EGD) WITH PROPOFOL N/A 11/02/2015   Procedure: ESOPHAGOGASTRODUODENOSCOPY (EGD) WITH PROPOFOL;  Surgeon: Manya Silvas, MD;  Location[facility] ENDOSCOPY;  Service: Endoscopy;  Laterality: N/A;  . SHOULDER ARTHROSCOPY WITH OPEN ROTATOR CUFF REPAIR Right 03/05/2016   Procedure: SHOULDER ARTHROSCOPY WITH OPEN ROTATOR CUFF REPAIR;  Surgeon: Earnestine Leys, MD;  Location: [facility] ORS;  Service: Orthopedics;  Laterality: Right;  . UPPER GI ENDOSCOPY  2013   Family History  Problem Relation Age of Onset  . Kidney cancer Father   . Diabetes Mother   .  Breast cancer Neg Hx    Social History   Socioeconomic History  . Marital status: Married    Spouse name: Not on file  . Number of children: Not on file  . Years of education: Not on file  . Highest education level: Not on file  Occupational History  . Not on file  Social Needs  . Financial resource strain: Not on file  . Food insecurity:    Worry: Not on file    Inability: Not on file  . Transportation needs:    Medical: Not on file    Non-medical: Not on file  Tobacco Use  . Smoking status: Current Every Day Smoker    Packs/day: 1.00    Types: Cigarettes  . Smokeless tobacco: Never Used   Substance and Sexual Activity  . Alcohol use: No    Alcohol/week: 0.0 oz  . Drug use: No  . Sexual activity: Never  Lifestyle  . Physical activity:    Days per week: Not on file    Minutes per session: Not on file  . Stress: Not on file  Relationships  . Social connections:    Talks on phone: Not on file    Gets together: Not on file    Attends religious service: Not on file    Active member of club or organization: Not on file    Attends meetings of clubs or organizations: Not on file    Relationship status: Not on file  Other Topics Concern  . Not on file  Social History Narrative  . Not on file    Outpatient Encounter Medications as of 02/17/2018  Medication Sig  . aspirin EC 81 MG tablet Take 81 mg by mouth daily.  Marland Kitchen atorvastatin (LIPITOR) 10 MG tablet TAKE 1 TABLET BY MOUTH ONCE DAILY  . CALCIUM-VITAMIN D PO Take 1 tablet by mouth 2 (two) times daily.   . furosemide (LASIX) 20 MG tablet TAKE 1 TABLET (20 MG TOTAL) BY MOUTH DAILY.  Marland Kitchen gabapentin (NEURONTIN) 400 MG capsule TAKE 1 CAPSULE BY MOUTH TWICE DAILY  . hydrochlorothiazide (HYDRODIURIL) 25 MG tablet TAKE 1 TABLET DAILY  . KLOR-CON M20 20 MEQ tablet TAKE 1 TABLET BY MOUTH TWICE (2) DAILY  . omeprazole (PRILOSEC) 20 MG capsule Take 1 capsule (20 mg total) by mouth 2 (two) times daily.  . [DISCONTINUED] fluticasone (FLONASE) 50 MCG/ACT nasal spray Place 2 sprays into both nostrils daily. As needed for runny nose and congestion  . [DISCONTINUED] ipratropium (ATROVENT) 0.06 % nasal spray Place 2 sprays into both nostrils 3 (three) times daily.  . [DISCONTINUED] potassium chloride (K-DUR) 10 MEQ tablet Take 1 tablet (10 mEq total) by mouth daily.   No facility-administered encounter medications on file as of 02/17/2018.     Activities of Daily Living In your present state of health, do you have any difficulty performing the following activities: 02/17/2018  Hearing? N  Vision? N  Difficulty concentrating or making  decisions? N  Walking or climbing stairs? N  Dressing or bathing? N  Doing errands, shopping? N  Preparing Food and eating ? N  Using the Toilet? N  In the past six months, have you accidently leaked urine? N  Do you have problems with loss of bowel control? N  Managing your Medications? N  Managing your Finances? N  Housekeeping or managing your Housekeeping? N  Some recent data might be hidden    Patient Care Team: Tower, Wynelle Fanny, MD as PCP - General Bary Castilla, Dellis Filbert  W, MD (General Surgery)    Assessment:   This is a routine wellness examination for Shakiah.   Hearing Screening   125Hz  250Hz  500Hz  1000Hz  2000Hz  3000Hz  4000Hz  6000Hz  8000Hz   Right ear:   40 40 40  40    Left ear:   40 40 40  40    Vision Screening Comments: Last vision exam in May 2018 @ [facility]   Exercise Activities and Dietary recommendations Current Exercise Habits: The patient does not participate in regular exercise at present, Exercise limited by: None identified  Goals    . Follow up with Primary Care Provider     Starting 02/17/2018, I will continue to take medications as prescribed and to keep appointments with PCP as scheduled.        Fall Risk Fall Risk  02/17/2018 02/14/2017 07/04/2016 03/16/2015 02/22/2014  Falls in the past year? No No Yes No No  Comment - - Emmi Telephone Survey: data to providers prior to load - -  Number falls in past yr: - - 2 or more - -  Comment - - Emmi Telephone Survey Actual Response = 2 - -  Injury with Fall? - - Yes - -   Depression Screen PHQ 2/9 Scores 02/17/2018 02/14/2017 03/16/2015 02/22/2014  PHQ - 2 Score 0 0 0 0  PHQ- 9 Score 0 - - -     Cognitive Function MMSE - Mini Mental State Exam 02/17/2018 02/14/2017  Orientation to time 5 5  Orientation to Place 5 5  Registration 3 3  Attention/ Calculation 0 0  Recall 1 2  Recall-comments unable to recall 2 of 3 words pt was unable to recall 1 of 3 words  Language- name 2 objects 0 0  Language-  repeat 1 1  Language- follow 3 step command 2 2  Language- follow 3 step command-comments unable to follow 1 step of 3 step command pt was unable to follow 1 step of 3 step command  Language- read & follow direction 0 0  Write a sentence 0 0  Copy design 0 0  Total score 17 18       PLEASE NOTE: A Mini-Cog screen was completed. Maximum score is 20. A value of 0 denotes this part of Folstein MMSE was not completed or the patient failed this part of the Mini-Cog screening.   Mini-Cog Screening Orientation to Time - Max 5 pts Orientation to Place - Max 5 pts Registration - Max 3 pts Recall - Max 3 pts Language Repeat - Max 1 pts Language Follow 3 Step Command - Max 3 pts   Immunization History  Administered Date(s) Administered  . Influenza Whole 12/18/2006, 01/03/2010, 08/21/2010  . Influenza,inj,Quad PF,6+ Mos 08/24/2013, 09/21/2014, 10/06/2015, 07/31/2016  . Influenza-Unspecified 09/03/2017  . Pneumococcal Conjugate-13 02/22/2014  . Pneumococcal Polysaccharide-23 04/09/2005  . Td 12/19/1998, 06/24/2012  . Tdap 07/24/2015, 07/21/2016    Screening Tests Health Maintenance  Topic Date Due  . MAMMOGRAM  05/23/2018  . INFLUENZA VACCINE  06/05/2018  . TETANUS/TDAP  07/21/2026  . DEXA SCAN  Completed  . PNA vac Low Risk Adult  Completed    Plan:     I have personally reviewed, addressed, and noted the following in the patient's chart:  A. Medical and social history B. Use of alcohol, tobacco or illicit drugs  C. Current medications and supplements D. Functional ability and status E.  Nutritional status F.  Physical activity G. Advance directives H. List of other physicians I.  Hospitalizations, surgeries, and ER visits in previous 12 months J.  [place] to include hearing, vision, cognitive, depression L. Referrals and appointments - none  In addition, I have reviewed and discussed with patient certain preventive protocols, quality metrics, and best  practice recommendations. A written personalized care plan for preventive services as well as general preventive health recommendations were provided to patient.  See attached scanned questionnaire for additional information.   Signed,   Lindell Noe, MHA, BS, LPN Health Coach

## 2018-02-17 NOTE — Progress Notes (Signed)
PCP notes:   Health maintenance:  No gaps identified.  Abnormal screenings:   Mini-Cog score: 17/20  Hearing Screening   125Hz  250Hz  500Hz  1000Hz  2000Hz  3000Hz  4000Hz  6000Hz  8000Hz   Right ear:   40 40 40  40    Left ear:   40 40 40  40     Patient concerns:   None  Nurse concerns:  None  Next PCP appt:   02/28/18 @ 1130  I reviewed health advisor's note, was available for consultation, and agree with documentation and plan. Loura Pardon MD

## 2018-02-17 NOTE — Patient Instructions (Addendum)
Regina Harris , Thank you for taking time to come for your Medicare Wellness Visit. I appreciate your ongoing commitment to your health goals. Please review the following plan we discussed and let me know if I can assist you in the future.   These are the goals we discussed: Goals    . Follow up with Primary Care Provider     Starting 02/17/2018, I will continue to take medications as prescribed and to keep appointments with PCP as scheduled.        This is a list of the screening recommended for you and due dates:  Health Maintenance  Topic Date Due  . Mammogram  05/23/2018  . Flu Shot  06/05/2018  . Tetanus Vaccine  07/21/2026  . DEXA scan (bone density measurement)  Completed  . Pneumonia vaccines  Completed   Preventive Care for Adults  A healthy lifestyle and preventive care can promote health and wellness. Preventive health guidelines for adults include the following key practices.  . A routine yearly physical is a good way to check with your health care provider about your health and preventive screening. It is a chance to share any concerns and updates on your health and to receive a thorough exam.  . Visit your dentist for a routine exam and preventive care every 6 months. Brush your teeth twice a day and floss once a day. Good oral hygiene prevents tooth decay and gum disease.  . The frequency of eye exams is based on your age, health, family medical history, use  of contact lenses, and other factors. Follow your health care provider's recommendations for frequency of eye exams.  . Eat a healthy diet. Foods like vegetables, fruits, whole grains, low-fat dairy products, and lean protein foods contain the nutrients you need without too many calories. Decrease your intake of foods high in solid fats, added sugars, and salt. Eat the right amount of calories for you. Get information about a proper diet from your health care provider, if necessary.  . Regular physical exercise is one  of the most important things you can do for your health. Most adults should get at least 150 minutes of moderate-intensity exercise (any activity that increases your heart rate and causes you to sweat) each week. In addition, most adults need muscle-strengthening exercises on 2 or more days a week.  Silver Sneakers may be a benefit available to you. To determine eligibility, you may visit the website: www.silversneakers.com or contact program at 804-024-9065 Mon-Fri between 8AM-8PM.   . Maintain a healthy weight. The body mass index (BMI) is a screening tool to identify possible weight problems. It provides an estimate of body fat based on height and weight. Your health care provider can find your BMI and can help you achieve or maintain a healthy weight.   For adults 20 years and older: ? A BMI below 18.5 is considered underweight. ? A BMI of 18.5 to 24.9 is normal. ? A BMI of 25 to 29.9 is considered overweight. ? A BMI of 30 and above is considered obese.   . Maintain normal blood lipids and cholesterol levels by exercising and minimizing your intake of saturated fat. Eat a balanced diet with plenty of fruit and vegetables. Blood tests for lipids and cholesterol should begin at age 34 and be repeated every 5 years. If your lipid or cholesterol levels are high, you are over 50, or you are at high risk for heart disease, you may need your cholesterol levels checked  more frequently. Ongoing high lipid and cholesterol levels should be treated with medicines if diet and exercise are not working.  . If you smoke, find out from your health care provider how to quit. If you do not use tobacco, please do not start.  . If you choose to drink alcohol, please do not consume more than 2 drinks per day. One drink is considered to be 12 ounces (355 mL) of beer, 5 ounces (148 mL) of wine, or 1.5 ounces (44 mL) of liquor.  . If you are 27-50 years old, ask your health care provider if you should take aspirin  to prevent strokes.  . Use sunscreen. Apply sunscreen liberally and repeatedly throughout the day. You should seek shade when your shadow is shorter than you. Protect yourself by wearing long sleeves, pants, a wide-brimmed hat, and sunglasses year round, whenever you are outdoors.  . Once a month, do a whole body skin exam, using a mirror to look at the skin on your back. Tell your health care provider of new moles, moles that have irregular borders, moles that are larger than a pencil eraser, or moles that have changed in shape or color.

## 2018-02-28 ENCOUNTER — Encounter: Payer: Self-pay | Admitting: Family Medicine

## 2018-02-28 ENCOUNTER — Ambulatory Visit (INDEPENDENT_AMBULATORY_CARE_PROVIDER_SITE_OTHER): Payer: Medicare Other | Admitting: Family Medicine

## 2018-02-28 VITALS — BP 130/80 | HR 96 | Temp 98.3°F | Ht 65.75 in | Wt 204.2 lb

## 2018-02-28 DIAGNOSIS — I10 Essential (primary) hypertension: Secondary | ICD-10-CM | POA: Diagnosis not present

## 2018-02-28 DIAGNOSIS — R0989 Other specified symptoms and signs involving the circulatory and respiratory systems: Secondary | ICD-10-CM

## 2018-02-28 DIAGNOSIS — E7800 Pure hypercholesterolemia, unspecified: Secondary | ICD-10-CM

## 2018-02-28 DIAGNOSIS — M75101 Unspecified rotator cuff tear or rupture of right shoulder, not specified as traumatic: Secondary | ICD-10-CM

## 2018-02-28 DIAGNOSIS — Z6831 Body mass index (BMI) 31.0-31.9, adult: Secondary | ICD-10-CM

## 2018-02-28 DIAGNOSIS — E6609 Other obesity due to excess calories: Secondary | ICD-10-CM | POA: Diagnosis not present

## 2018-02-28 DIAGNOSIS — E0590 Thyrotoxicosis, unspecified without thyrotoxic crisis or storm: Secondary | ICD-10-CM

## 2018-02-28 DIAGNOSIS — I6523 Occlusion and stenosis of bilateral carotid arteries: Secondary | ICD-10-CM | POA: Diagnosis not present

## 2018-02-28 DIAGNOSIS — R7303 Prediabetes: Secondary | ICD-10-CM

## 2018-02-28 DIAGNOSIS — F17200 Nicotine dependence, unspecified, uncomplicated: Secondary | ICD-10-CM | POA: Diagnosis not present

## 2018-02-28 MED ORDER — GABAPENTIN 400 MG PO CAPS: 400.0000 mg | ORAL_CAPSULE | Freq: Two times a day (BID) | ORAL | 3 refills | Status: DC

## 2018-02-28 MED ORDER — OMEPRAZOLE 20 MG PO CPDR: 20.0000 mg | DELAYED_RELEASE_CAPSULE | Freq: Two times a day (BID) | ORAL | 3 refills | Status: DC

## 2018-02-28 MED ORDER — HYDROCHLOROTHIAZIDE 25 MG PO TABS: 25.0000 mg | ORAL_TABLET | Freq: Every day | ORAL | 3 refills | Status: DC

## 2018-02-28 MED ORDER — ATORVASTATIN CALCIUM 10 MG PO TABS: 10.0000 mg | ORAL_TABLET | Freq: Every day | ORAL | 3 refills | Status: DC

## 2018-02-28 MED ORDER — FUROSEMIDE 20 MG PO TABS: ORAL_TABLET | ORAL | 3 refills | Status: DC

## 2018-02-28 MED ORDER — POTASSIUM CHLORIDE CRYS ER 20 MEQ PO TBCR: EXTENDED_RELEASE_TABLET | ORAL | 3 refills | Status: DC

## 2018-02-28 NOTE — Patient Instructions (Addendum)
Your oxygen is low and blood count is high  You really need to quit smoking  Plan a quit date this weekend please  Grab a book or word search instead of cigarettes when you get the urge   If you are interested in the new shingles vaccine (Shingrix) - call your local pharmacy to check on coverage and availability  Then if affordable - get on wait list at [facility]   To prevent diabetes  Try to get most of your carbohydrates from produce (with the exception of white potatoes)  Eat less bread/pasta/rice/snack foods/cereals/sweets and other items from the middle of the grocery store (processed carbs) You are prediabetic so- no more sweet tea with sugar   We will refer you back to endocrinology for thyroid follow up

## 2018-02-28 NOTE — Progress Notes (Signed)
Subjective:    Patient ID: Regina Harris, female    DOB: 01-Jul-1936, 82 y.o.   MRN: [id number]  HPI  Here for annual f/u of chronic medical problems  Doing well -had a good Easter  Not doing a lot  Thinks she is taking care of herself   Wt Readings from Last 3 Encounters:  02/28/18 204 lb 4 oz (92.6 kg)  02/17/18 204 lb 4 oz (92.6 kg)  11/19/17 201 lb 8 oz (91.4 kg)  eating is good = (? If health foods all the time) Is avoiding sweets and high cholesterol foods Not a lot for exercise  33.22 kg/m   Stays with daughter on weekends and gets out  Golden daughter lives close  A lot of support from family since loosing husband   amw 4/15 No concerns or gaps   Mammogram 7/18 neg Self breast exam - no breast lumps   dexa 7/18 -bmd in the normal range  No falls  No fractures  Takes her ca and D as scheduled    ([name] does her medicine)   Zoster status - has not had it Interested in shingrix    Smoking status  Pulse ox is 90% RA Not feeling sob  occ cough - but no wheezing  She feels ready to try quitting again (cold [place])  1ppd   Likes to do word search puzzles    bp is up on first check today  No cp or palpitations or headaches or edema  No side effects to medicines  BP Readings from Last 3 Encounters:  02/28/18 140/70  02/17/18 118/70  11/19/17 126/82      Subclinical hyperthyroid Endocrine f/u Lab Results  Component Value Date   TSH 0.11 (L) 02/17/2018   no symptoms Overdue for her endocrine f/u at [facility] clinic (I think0  Hyperlipidemia Lab Results  Component Value Date   CHOL 134 02/17/2018   CHOL 200 02/14/2017   CHOL 187 03/16/2015   Lab Results  Component Value Date   HDL 45.90 02/17/2018   HDL 51.40 02/14/2017   HDL 53.40 03/16/2015   Lab Results  Component Value Date   LDLCALC 69 02/17/2018   LDLCALC 123 (H) 02/14/2017   LDLCALC 109 (H) 03/16/2015   Lab Results  Component Value Date   TRIG 94.0 02/17/2018   TRIG 127.0  02/14/2017   TRIG 124.0 03/16/2015   Lab Results  Component Value Date   CHOLHDL 3 02/17/2018   CHOLHDL 4 02/14/2017   CHOLHDL 4 03/16/2015   No results found for: LDLDIRECT Atorvastatin and diet  Does not eat as much as she used to   Lab Results  Component Value Date   WBC 8.0 02/17/2018   HGB 17.8 (H) 02/17/2018   HCT 52.2 (H) 02/17/2018   MCV 94.7 02/17/2018   PLT 210.0 02/17/2018   Hb up from the smoking   Lab Results  Component Value Date   HGBA1C 6.5 02/17/2018  up from 6.3 Does eat less sweets  Drinks diet [place] dew    Lab Results  Component Value Date   CREATININE 0.88 02/17/2018   BUN 17 02/17/2018   NA 140 02/17/2018   K 4.1 02/17/2018   CL 103 02/17/2018   CO2 29 02/17/2018   Lab Results  Component Value Date   ALT 12 02/17/2018   AST 14 02/17/2018   ALKPHOS 63 02/17/2018   BILITOT 0.5 02/17/2018     Patient Active Problem List   Diagnosis  Date Noted  . Encounter for screening mammogram for breast cancer 02/27/2017  . Estrogen deficiency 02/27/2017  . Foot pain, bilateral 02/27/2017  . Subclinical hyperthyroidism 04/16/2016  . Pedal edema 04/05/2016  . Right rotator cuff tear 03/09/2016  . Hypokalemia 03/01/2016  . Nodule of chest wall 10/06/2014  . Left breast mass 06/30/2014  . Encounter for Medicare annual wellness exam 02/22/2014  . Polycythemia, secondary 02/22/2014  . GERD (gastroesophageal reflux disease) 08/24/2013  . Lump or mass in breast 05/06/2013  . Post-menopausal 06/24/2012  . Obesity 12/24/2011  . Hyperglycemia 03/12/2011  . NEOPLASM UNSPECIFIED NATURE DIGESTIVE SYSTEM 07/24/2010  . LUNG NODULE 01/16/2010  . HYPERCHOLESTEROLEMIA, PURE 07/10/2007  . Smoker 03/19/2007  . Essential hypertension 03/19/2007  . Carotid bruit 03/19/2007  . COLONIC POLYPS, HX OF 03/19/2007   Past Medical History:  Diagnosis Date  . Barrett esophagus   . Carotid bruit   . Diabetes mellitus without complication ([place])   . Esophagitis     . Gastritis 2013  . GERD (gastroesophageal reflux disease)   . HH (hiatus hernia)   . History of colonic polyps   . History of repair of right rotator cuff   . Hyperlipidemia   . Hypertension   . Lump or mass in breast   . Pneumonia   . Polycythemia, secondary   . Tobacco abuse    Past Surgical History:  Procedure Laterality Date  . ABDOMINAL HYSTERECTOMY    . APPENDECTOMY    . BREAST BIOPSY Right 1996  . BREAST BIOPSY Left 10/09/2012   Benign breast tissue with focal fat necrosis and focal periductal chronic inflammation.  Marland Kitchen BREAST BIOPSY Left 10/09/2012   Benign breast tissue with focal fat necrosis and focal periductal chronic inflammation.  Marland Kitchen CATARACT EXTRACTION  3/11   Dr Charise Killian  . CHOLECYSTECTOMY    . COLONOSCOPY  2009  . ESOPHAGOGASTRODUODENOSCOPY (EGD) WITH PROPOFOL N/A 11/02/2015   Procedure: ESOPHAGOGASTRODUODENOSCOPY (EGD) WITH PROPOFOL;  Surgeon: Manya Silvas, MD;  Location: [facility] ENDOSCOPY;  Service: Endoscopy;  Laterality: N/A;  . SHOULDER ARTHROSCOPY WITH OPEN ROTATOR CUFF REPAIR Right 03/05/2016   Procedure: SHOULDER ARTHROSCOPY WITH OPEN ROTATOR CUFF REPAIR;  Surgeon: Earnestine Leys, MD;  Location: [facility] ORS;  Service: Orthopedics;  Laterality: Right;  . UPPER GI ENDOSCOPY  2013   Social History   Tobacco Use  . Smoking status: Current Every Day Smoker    Packs/day: 1.00    Types: Cigarettes  . Smokeless tobacco: Never Used  Substance Use Topics  . Alcohol use: No    Alcohol/week: 0.0 oz  . Drug use: No   Family History  Problem Relation Age of Onset  . Kidney cancer Father   . Diabetes Mother   . Breast cancer Neg Hx    No Known Allergies Current Outpatient Medications on File Prior to Visit  Medication Sig Dispense Refill  . aspirin EC 81 MG tablet Take 81 mg by mouth daily.    Marland Kitchen CALCIUM-VITAMIN D PO Take 1 tablet by mouth 2 (two) times daily.     . [DISCONTINUED] potassium chloride (K-DUR) 10 MEQ tablet Take 1 tablet (10 mEq total) by mouth  daily. 30 tablet 3   No current facility-administered medications on file prior to visit.     Review of Systems  Constitutional: Positive for fatigue. Negative for activity change, appetite change, fever and unexpected weight change.  HENT: Negative for congestion, ear pain, rhinorrhea, sinus pressure and sore throat.   Eyes: Negative for pain, redness and  visual disturbance.  Respiratory: Negative for cough, chest tightness, shortness of breath and wheezing.   Cardiovascular: Negative for chest pain and palpitations.  Gastrointestinal: Negative for abdominal pain, blood in stool, constipation and diarrhea.  Endocrine: Negative for polydipsia and polyuria.  Genitourinary: Negative for dysuria, frequency and urgency.  Musculoskeletal: Negative for arthralgias, back pain and myalgias.  Skin: Negative for pallor and rash.  Allergic/Immunologic: Negative for environmental allergies.  Neurological: Negative for dizziness, syncope and headaches.  Hematological: Negative for adenopathy. Does not bruise/bleed easily.  Psychiatric/Behavioral: Negative for decreased concentration and dysphoric mood. The patient is not nervous/anxious.        Grief  Good support        Objective:   Physical Exam  Constitutional: She appears well-developed and well-nourished. No distress.  obese and well appearing   HENT:  Head: Normocephalic and atraumatic.  Right Ear: External ear normal.  Left Ear: External ear normal.  Mouth/Throat: Oropharynx is clear and moist.  Eyes: Pupils are equal, round, and reactive to light. Conjunctivae and EOM are normal. No scleral icterus.  Neck: Normal range of motion. Neck supple. No JVD present. Carotid bruit is not present. No thyromegaly present.  Cardiovascular: Normal rate, regular rhythm, normal heart sounds and intact distal pulses. Exam reveals no gallop.  Pulmonary/Chest: Effort normal and breath sounds normal. No respiratory distress. She has no wheezes. She  exhibits no tenderness. No breast tenderness, discharge or bleeding.  Diffusely distant bs Fairly good air exch No wheeze or crackles   Abdominal: Soft. Bowel sounds are normal. She exhibits no distension, no abdominal bruit and no mass. There is no tenderness.  Genitourinary: No breast tenderness, discharge or bleeding.  Musculoskeletal: Normal range of motion. She exhibits no edema or tenderness.  Lymphadenopathy:    She has no cervical adenopathy.  Neurological: She is alert. She has normal reflexes. No cranial nerve deficit. She exhibits normal muscle tone. Coordination normal.  Skin: Skin is warm and dry. No rash noted. No erythema. No pallor.  Rosacea Solar aging sks   Psychiatric: She has a normal mood and affect.  Pleasant           Assessment & Plan:   Problem List Items Addressed This Visit      Cardiovascular and Mediastinum   Essential hypertension - Primary    bp in fair control at this time  BP Readings from Last 1 Encounters:  02/28/18 130/80   No changes needed Disc lifstyle change with low sodium diet and exercise  Labs reviewed       Relevant Medications   atorvastatin (LIPITOR) 10 MG tablet   furosemide (LASIX) 20 MG tablet   hydrochlorothiazide (HYDRODIURIL) 25 MG tablet     Endocrine   Subclinical hyperthyroidism    Overdue for endocrinology f/u  Will refer       Relevant Orders   Ambulatory referral to Endocrinology     Musculoskeletal and Integument   Right rotator cuff tear    Still some rom issues after repair         Other   Carotid bruit    Doppler was 5/18  Worse stenosis on L  No symptoms  rec 1 y f/u-unsure if pt is interested in this  Continue low dose asa Also statin       HYPERCHOLESTEROLEMIA, PURE    Disc goals for lipids and reasons to control them Rev labs with pt Rev low sat fat diet in detail Continue statin and diet  Relevant Medications   atorvastatin (LIPITOR) 10 MG tablet   furosemide (LASIX) 20  MG tablet   hydrochlorothiazide (HYDRODIURIL) 25 MG tablet   Obesity   Prediabetes    Lab Results  Component Value Date   HGBA1C 6.5 02/17/2018   Disc prediabetes  disc imp of low glycemic diet and wt loss to prevent DM2  Needs to stop sweet tea      Smoker    Disc in detail risks of smoking and possible outcomes including copd, vascular/ heart disease, cancer , respiratory and sinus infections  Pt voices understanding  Pulse ox 90 % RA today  Also HB remains high  She is open to idea or quitting-unsure when  Has done it cold [place] in the past  Enc her to quit

## 2018-03-02 NOTE — Assessment & Plan Note (Signed)
Disc goals for lipids and reasons to control them Rev labs with pt Rev low sat fat diet in detail Continue statin and diet

## 2018-03-02 NOTE — Assessment & Plan Note (Signed)
Still some rom issues after repair

## 2018-03-02 NOTE — Assessment & Plan Note (Signed)
Lab Results  Component Value Date   HGBA1C 6.5 02/17/2018   Disc prediabetes  disc imp of low glycemic diet and wt loss to prevent DM2  Needs to stop sweet tea

## 2018-03-02 NOTE — Assessment & Plan Note (Signed)
Disc in detail risks of smoking and possible outcomes including copd, vascular/ heart disease, cancer , respiratory and sinus infections  Pt voices understanding  Pulse ox 90 % RA today  Also HB remains high  She is open to idea or quitting-unsure when  Has done it cold [place] in the past  Enc her to quit

## 2018-03-02 NOTE — Assessment & Plan Note (Signed)
Overdue for endocrinology f/u  Will refer

## 2018-03-02 NOTE — Assessment & Plan Note (Signed)
bp in fair control at this time  BP Readings from Last 1 Encounters:  02/28/18 130/80   No changes needed Disc lifstyle change with low sodium diet and exercise  Labs reviewed

## 2018-03-02 NOTE — Assessment & Plan Note (Signed)
Doppler was 5/18  Worse stenosis on L  No symptoms  rec 1 y f/u-unsure if pt is interested in this  Continue low dose asa Also statin

## 2018-03-10 ENCOUNTER — Other Ambulatory Visit: Payer: Self-pay | Admitting: Internal Medicine

## 2018-03-10 DIAGNOSIS — E0590 Thyrotoxicosis, unspecified without thyrotoxic crisis or storm: Secondary | ICD-10-CM

## 2018-03-20 ENCOUNTER — Encounter
Admission: RE | Admit: 2018-03-20 | Discharge: 2018-03-20 | Disposition: A | Discharge status: Home / Self Care | Payer: Medicare Other | Source: Ambulatory Visit | Attending: Internal Medicine | Admitting: Internal Medicine

## 2018-03-20 DIAGNOSIS — E0590 Thyrotoxicosis, unspecified without thyrotoxic crisis or storm: Secondary | ICD-10-CM

## 2018-03-20 MED ORDER — SODIUM IODIDE I-123 7.4 MBQ CAPS
200.0000 | ORAL_CAPSULE | Freq: Once | ORAL | Status: AC
  Administered 2018-03-20: 145.049 via ORAL

## 2018-03-21 ENCOUNTER — Encounter
Admission: RE | Admit: 2018-03-21 | Discharge: 2018-03-21 | Disposition: A | Discharge status: Home / Self Care | Payer: Medicare Other | Source: Ambulatory Visit | Attending: Internal Medicine | Admitting: Internal Medicine

## 2018-03-21 DIAGNOSIS — E213 Hyperparathyroidism, unspecified: Secondary | ICD-10-CM | POA: Diagnosis not present

## 2018-03-26 ENCOUNTER — Other Ambulatory Visit: Payer: Self-pay | Admitting: Family Medicine

## 2018-03-26 DIAGNOSIS — I6523 Occlusion and stenosis of bilateral carotid arteries: Secondary | ICD-10-CM

## 2018-04-03 ENCOUNTER — Ambulatory Visit (INDEPENDENT_AMBULATORY_CARE_PROVIDER_SITE_OTHER): Payer: Medicare Other

## 2018-04-03 DIAGNOSIS — I6523 Occlusion and stenosis of bilateral carotid arteries: Secondary | ICD-10-CM | POA: Diagnosis not present

## 2018-04-09 ENCOUNTER — Other Ambulatory Visit: Payer: Self-pay | Admitting: Family Medicine

## 2018-04-09 DIAGNOSIS — Z1231 Encounter for screening mammogram for malignant neoplasm of breast: Secondary | ICD-10-CM

## 2018-05-07 DIAGNOSIS — E0590 Thyrotoxicosis, unspecified without thyrotoxic crisis or storm: Secondary | ICD-10-CM | POA: Diagnosis not present

## 2018-05-16 DIAGNOSIS — E0520 Thyrotoxicosis with toxic multinodular goiter without thyrotoxic crisis or storm: Secondary | ICD-10-CM | POA: Diagnosis not present

## 2018-05-22 ENCOUNTER — Other Ambulatory Visit: Payer: Self-pay | Admitting: Internal Medicine

## 2018-05-22 DIAGNOSIS — E0520 Thyrotoxicosis with toxic multinodular goiter without thyrotoxic crisis or storm: Secondary | ICD-10-CM

## 2018-05-26 ENCOUNTER — Ambulatory Visit
Admission: RE | Admit: 2018-05-26 | Discharge: 2018-05-26 | Disposition: A | Discharge status: Home / Self Care | Payer: Medicare Other | Source: Ambulatory Visit | Attending: Family Medicine | Admitting: Family Medicine

## 2018-05-26 DIAGNOSIS — Z1231 Encounter for screening mammogram for malignant neoplasm of breast: Secondary | ICD-10-CM | POA: Insufficient documentation

## 2018-06-09 ENCOUNTER — Ambulatory Visit
Admission: RE | Admit: 2018-06-09 | Discharge: 2018-06-09 | Disposition: A | Discharge status: Home / Self Care | Payer: Medicare Other | Source: Ambulatory Visit | Attending: Family Medicine | Admitting: Family Medicine

## 2018-06-09 ENCOUNTER — Telehealth: Payer: Self-pay | Admitting: Family Medicine

## 2018-06-09 ENCOUNTER — Encounter
Admission: RE | Admit: 2018-06-09 | Discharge: 2018-06-09 | Disposition: A | Discharge status: Home / Self Care | Payer: Medicare Other | Source: Ambulatory Visit | Attending: Internal Medicine | Admitting: Internal Medicine

## 2018-06-09 DIAGNOSIS — E0520 Thyrotoxicosis with toxic multinodular goiter without thyrotoxic crisis or storm: Secondary | ICD-10-CM | POA: Diagnosis not present

## 2018-06-09 DIAGNOSIS — E0590 Thyrotoxicosis, unspecified without thyrotoxic crisis or storm: Secondary | ICD-10-CM

## 2018-06-09 DIAGNOSIS — M7989 Other specified soft tissue disorders: Secondary | ICD-10-CM | POA: Diagnosis not present

## 2018-06-09 DIAGNOSIS — M7121 Synovial cyst of popliteal space [Baker], right knee: Secondary | ICD-10-CM

## 2018-06-09 DIAGNOSIS — E042 Nontoxic multinodular goiter: Secondary | ICD-10-CM | POA: Diagnosis not present

## 2018-06-09 DIAGNOSIS — M25561 Pain in right knee: Secondary | ICD-10-CM | POA: Insufficient documentation

## 2018-06-09 DIAGNOSIS — M79661 Pain in right lower leg: Secondary | ICD-10-CM | POA: Diagnosis not present

## 2018-06-09 HISTORY — DX: Thyrotoxicosis, unspecified without thyrotoxic crisis or storm: E05.90

## 2018-06-09 MED ORDER — SODIUM IODIDE I 131 CAPSULE: 30.0860 | Freq: Once | INTRAVENOUS | Status: DC | PRN

## 2018-06-09 NOTE — Telephone Encounter (Signed)
-----   Message from Tammi Sou, [place] sent at 06/09/2018  3:42 PM EDT ----- Pt's granddaughter [name] notified of Dr. Marliss Coots comments off of mychart. They do agree with referral to Ortho, pt has seen [facility] in the past and if possible they would like pt to go back there. Also FYI pt isn't suppose to be around anyone this week due to treatment today so they would have to schedule appt next week or so, please put referral in and I advise [name] our [facility] will call to schedule appt

## 2018-06-09 NOTE — Telephone Encounter (Signed)
Referral done Will route to [facility] Thanks

## 2018-06-09 NOTE — Telephone Encounter (Signed)
Called granddaughter [name] and told her the Stat order for [place] has been placed and also called the [facility] scheduling line to let them know that the patient was there at [facility] and I would direct her to go to registration desk to let them know that she was there.

## 2018-06-09 NOTE — Telephone Encounter (Signed)
Pt granddaughter, [name] [phone] called for pt. PT is at [facility] to get iodine treatment for thyroid but she has a red swollen knee that needs attention. Pt is requesting a Stat US of R knee and lower extremity to rule out DVT- needs to done prior to iodine treatment.

## 2018-06-10 NOTE — Telephone Encounter (Signed)
Had to [facility] for granddaughter [name] and daughter Hoyle Sauer to call me back about Ortho Appt.

## 2018-06-11 NOTE — Telephone Encounter (Signed)
Appt made and patients grandaughter notified. Records faxed to [facility].

## 2018-06-17 DIAGNOSIS — M25561 Pain in right knee: Secondary | ICD-10-CM | POA: Diagnosis not present

## 2018-08-26 DIAGNOSIS — E0590 Thyrotoxicosis, unspecified without thyrotoxic crisis or storm: Secondary | ICD-10-CM | POA: Diagnosis not present

## 2018-08-26 DIAGNOSIS — E0520 Thyrotoxicosis with toxic multinodular goiter without thyrotoxic crisis or storm: Secondary | ICD-10-CM | POA: Diagnosis not present

## 2018-09-02 DIAGNOSIS — E890 Postprocedural hypothyroidism: Secondary | ICD-10-CM | POA: Insufficient documentation

## 2018-09-04 DIAGNOSIS — Z961 Presence of intraocular lens: Secondary | ICD-10-CM | POA: Diagnosis not present

## 2018-10-01 ENCOUNTER — Ambulatory Visit (INDEPENDENT_AMBULATORY_CARE_PROVIDER_SITE_OTHER): Payer: Medicare Other | Admitting: Family Medicine

## 2018-10-01 ENCOUNTER — Encounter: Payer: Self-pay | Admitting: Family Medicine

## 2018-10-01 VITALS — BP 126/80 | HR 88 | Temp 97.8°F | Ht 65.75 in | Wt 201.8 lb

## 2018-10-01 DIAGNOSIS — I6523 Occlusion and stenosis of bilateral carotid arteries: Secondary | ICD-10-CM

## 2018-10-01 DIAGNOSIS — J209 Acute bronchitis, unspecified: Secondary | ICD-10-CM

## 2018-10-01 DIAGNOSIS — R8290 Unspecified abnormal findings in urine: Secondary | ICD-10-CM | POA: Diagnosis not present

## 2018-10-01 DIAGNOSIS — R5382 Chronic fatigue, unspecified: Secondary | ICD-10-CM

## 2018-10-01 DIAGNOSIS — R350 Frequency of micturition: Secondary | ICD-10-CM | POA: Diagnosis not present

## 2018-10-01 DIAGNOSIS — F17200 Nicotine dependence, unspecified, uncomplicated: Secondary | ICD-10-CM | POA: Diagnosis not present

## 2018-10-01 DIAGNOSIS — R5383 Other fatigue: Secondary | ICD-10-CM | POA: Insufficient documentation

## 2018-10-01 LAB — POCT URINALYSIS DIPSTICK
BILIRUBIN UA: NEGATIVE
Blood, UA: NEGATIVE
Glucose, UA: NEGATIVE
Ketones, UA: NEGATIVE
Nitrite, UA: NEGATIVE
PH UA: 6 (ref 5.0–8.0)
PROTEIN UA: NEGATIVE
Spec Grav, UA: 1.01 (ref 1.010–1.025)
UROBILINOGEN UA: NEGATIVE U/dL — AB

## 2018-10-01 MED ORDER — DOXYCYCLINE HYCLATE 100 MG PO TABS
100.0000 mg | ORAL_TABLET | Freq: Two times a day (BID) | ORAL | 0 refills | Status: DC
Start: 2018-10-01 — End: 2018-10-14

## 2018-10-01 NOTE — Patient Instructions (Addendum)
Goal for fluid intake is 64 oz per day  Mostly water / a few other things are ok   Take the doxycycline as directed for bronchitis  Keep thinking about quitting smoking   Get some rest but go for some walks as well  Heat is helpful for back spasm - 10 minutes   Leave a urine sample on the way out and we will get back to you about the result

## 2018-10-01 NOTE — Progress Notes (Signed)
Subjective:    Patient ID: Regina Harris, female    DOB: 1936/02/04, 82 y.o.   MRN: [id number]  HPI Here for uri symptoms/congestion and fatigue  Has a lot of allergy/resp symptoms on and off This is worse for a week  Really tired - laying around  Perhaps body aches  cough Very stuffy nose  Using flonase every day  Not a lot nasal discharge  No sinus pain or pressure  No headache  Throat -not sore  Ears are ok    No pain to urinate  No change in odor  Urine is a bit darker   Drinks diet mt dew  Not a lot of water    Low back hurts -last few days -in the middle  Improved today   Continues to smoke Pulse ox is 94% today  Denies sob  Lab Results  Component Value Date   TSH 0.11 (L) 02/17/2018   h/o subclinical hyperthyroidism (treated with RI) Now post ablative hypothyroidism  Sees Dr Gabriel Carina Is on levothyroxine 25 mcg daily   TSH in oct was 6.31  Patient Active Problem List   Diagnosis Date Noted  . Acute bronchitis 10/01/2018  . Fatigue 10/01/2018  . Urine frequency 10/01/2018  . Abnormal urinalysis 10/01/2018  . Right leg swelling 06/09/2018  . Right knee pain 06/09/2018  . Baker's cyst of knee, right 06/09/2018  . Encounter for screening mammogram for breast cancer 02/27/2017  . Estrogen deficiency 02/27/2017  . Foot pain, bilateral 02/27/2017  . Subclinical hyperthyroidism 04/16/2016  . Pedal edema 04/05/2016  . Right rotator cuff tear 03/09/2016  . Hypokalemia 03/01/2016  . Nodule of chest wall 10/06/2014  . Left breast mass 06/30/2014  . Encounter for Medicare annual wellness exam 02/22/2014  . Polycythemia, secondary 02/22/2014  . GERD (gastroesophageal reflux disease) 08/24/2013  . Lump or mass in breast 05/06/2013  . Post-menopausal 06/24/2012  . Obesity 12/24/2011  . Prediabetes 03/12/2011  . NEOPLASM UNSPECIFIED NATURE DIGESTIVE SYSTEM 07/24/2010  . LUNG NODULE 01/16/2010  . HYPERCHOLESTEROLEMIA, PURE 07/10/2007  . Smoker 03/19/2007    . Essential hypertension 03/19/2007  . Carotid bruit 03/19/2007  . COLONIC POLYPS, HX OF 03/19/2007   Past Medical History:  Diagnosis Date  . Barrett esophagus   . Carotid bruit   . Diabetes mellitus without complication ([place])   . Esophagitis   . Gastritis 2013  . GERD (gastroesophageal reflux disease)   . HH (hiatus hernia)   . History of colonic polyps   . History of repair of right rotator cuff   . Hyperlipidemia   . Hypertension   . Hyperthyroidism 06/09/2018   Per [place] RAI Therapy for Hyperthyroidism order  . Lump or mass in breast   . Pneumonia   . Polycythemia, secondary   . Tobacco abuse    Past Surgical History:  Procedure Laterality Date  . ABDOMINAL HYSTERECTOMY    . APPENDECTOMY    . BREAST BIOPSY Right 1996  . BREAST BIOPSY Left 10/09/2012   Benign breast tissue with focal fat necrosis and focal periductal chronic inflammation.  Marland Kitchen BREAST BIOPSY Left 10/09/2012   Benign breast tissue with focal fat necrosis and focal periductal chronic inflammation.  Marland Kitchen CATARACT EXTRACTION  3/11   Dr Charise Killian  . CHOLECYSTECTOMY    . COLONOSCOPY  2009  . ESOPHAGOGASTRODUODENOSCOPY (EGD) WITH PROPOFOL N/A 11/02/2015   Procedure: ESOPHAGOGASTRODUODENOSCOPY (EGD) WITH PROPOFOL;  Surgeon: Manya Silvas, MD;  Location: [facility] ENDOSCOPY;  Service: Endoscopy;  Laterality: N/A;  .  SHOULDER ARTHROSCOPY WITH OPEN ROTATOR CUFF REPAIR Right 03/05/2016   Procedure: SHOULDER ARTHROSCOPY WITH OPEN ROTATOR CUFF REPAIR;  Surgeon: Earnestine Leys, MD;  Location: [facility] ORS;  Service: Orthopedics;  Laterality: Right;  . UPPER GI ENDOSCOPY  2013   Social History   Tobacco Use  . Smoking status: Current Every Day Smoker    Packs/day: 1.00    Types: Cigarettes  . Smokeless tobacco: Never Used  Substance Use Topics  . Alcohol use: No    Alcohol/week: 0.0 standard drinks  . Drug use: No   Family History  Problem Relation Age of Onset  . Kidney cancer Father   . Diabetes Mother   . Breast cancer  Neg Hx    No Known Allergies Current Outpatient Medications on File Prior to Visit  Medication Sig Dispense Refill  . aspirin EC 81 MG tablet Take 81 mg by mouth daily.    Marland Kitchen atorvastatin (LIPITOR) 10 MG tablet Take 1 tablet (10 mg total) by mouth daily. 90 tablet 3  . CALCIUM-VITAMIN D PO Take 1 tablet by mouth 2 (two) times daily.     . furosemide (LASIX) 20 MG tablet TAKE 1 TABLET (20 MG TOTAL) BY MOUTH DAILY. 90 tablet 3  . gabapentin (NEURONTIN) 400 MG capsule Take 1 capsule (400 mg total) by mouth 2 (two) times daily. 180 capsule 3  . hydrochlorothiazide (HYDRODIURIL) 25 MG tablet Take 1 tablet (25 mg total) by mouth daily. 90 tablet 3  . levothyroxine (SYNTHROID, LEVOTHROID) 25 MCG tablet Take 25 mcg by mouth daily before breakfast.    . omeprazole (PRILOSEC) 20 MG capsule Take 1 capsule (20 mg total) by mouth 2 (two) times daily. 180 capsule 3  . potassium chloride SA (KLOR-CON M20) 20 MEQ tablet TAKE 1 TABLET BY MOUTH TWICE (2) DAILY 180 tablet 3  . [DISCONTINUED] potassium chloride (K-DUR) 10 MEQ tablet Take 1 tablet (10 mEq total) by mouth daily. 30 tablet 3   No current facility-administered medications on file prior to visit.      Review of Systems  Constitutional: Positive for fatigue. Negative for activity change, appetite change, chills, diaphoresis, fever and unexpected weight change.  HENT: Positive for congestion. Negative for ear pain, rhinorrhea, sinus pressure, sinus pain and sore throat.   Eyes: Negative for pain, redness and visual disturbance.  Respiratory: Positive for cough. Negative for shortness of breath and wheezing.   Cardiovascular: Negative for chest pain and palpitations.  Gastrointestinal: Negative for abdominal pain, blood in stool, constipation and diarrhea.  Endocrine: Negative for polydipsia and polyuria.  Genitourinary: Negative for dysuria, frequency, pelvic pain and urgency.  Musculoskeletal: Negative for arthralgias, back pain and myalgias.    Skin: Negative for pallor and rash.  Allergic/Immunologic: Negative for environmental allergies.  Neurological: Negative for dizziness, syncope and headaches.  Hematological: Negative for adenopathy. Does not bruise/bleed easily.  Psychiatric/Behavioral: Negative for decreased concentration and dysphoric mood. The patient is not nervous/anxious.        Objective:   Physical Exam  Constitutional: She appears well-developed and well-nourished. No distress.  Well appearing but fatigued   HENT:  Head: Normocephalic and atraumatic.  Right Ear: External ear normal.  Left Ear: External ear normal.  Mouth/Throat: Oropharynx is clear and moist. No oropharyngeal exudate.  Nares are injected and congested  Clear pnd  Eyes: Pupils are equal, round, and reactive to light. Conjunctivae and EOM are normal. Right eye exhibits no discharge. Left eye exhibits no discharge. No scleral icterus.  Neck: Normal range  of motion. Neck supple.  Cardiovascular: Normal rate, regular rhythm and normal heart sounds.  Pulmonary/Chest: Effort normal and breath sounds normal. No stridor. No respiratory distress. She has no wheezes.  Diffusely distant bs Harsh bs   Musculoskeletal: She exhibits no edema or tenderness.  Lymphadenopathy:    She has no cervical adenopathy.  Neurological: No cranial nerve deficit. Coordination normal.  Skin: Skin is warm and dry. No rash noted. No erythema.  Rosacea           Assessment & Plan:   Problem List Items Addressed This Visit      Respiratory   Acute bronchitis    Fatigue and malaise with cough in a smoker  Enc smoking cessation  Cover with doxycycline  If no improvement-consider cxr  Expectorant prn  Update if not starting to improve in a week or if worsening          Other   Urine frequency    Culture pending      Relevant Orders   Urine Culture (Completed)   POCT urinalysis dipstick (Completed)   Smoker    Disc in detail risks of smoking and  possible outcomes including copd, vascular/ heart disease, cancer , respiratory and sinus infections  Pt voices understanding Not interested in quitting yet  Denies sob       Fatigue - Primary    Pt has symptoms consistent with bronchitis (cover with doxycycline)  Also hypothyroid sp tx for hyperthyroidism Urine -with leukocytes /sent for cx  Consider cxr and labs if no improvement       Abnormal urinalysis    No urinary symptoms -but states urine was darker than usual (also not drinking a lot of fluids)  Leukocytes on UA Does have some low back pain  Sent for cx-will update       Relevant Orders   Urine Culture (Completed)

## 2018-10-03 LAB — URINE CULTURE
MICRO NUMBER: 91431263
SPECIMEN QUALITY:: ADEQUATE

## 2018-10-03 NOTE — Assessment & Plan Note (Signed)
No urinary symptoms -but states urine was darker than usual (also not drinking a lot of fluids)  Leukocytes on UA Does have some low back pain  Sent for cx-will update

## 2018-10-03 NOTE — Assessment & Plan Note (Signed)
Pt has symptoms consistent with bronchitis (cover with doxycycline)  Also hypothyroid sp tx for hyperthyroidism Urine -with leukocytes /sent for cx  Consider cxr and labs if no improvement

## 2018-10-03 NOTE — Assessment & Plan Note (Signed)
Fatigue and malaise with cough in a smoker  Enc smoking cessation  Cover with doxycycline  If no improvement-consider cxr  Expectorant prn  Update if not starting to improve in a week or if worsening

## 2018-10-03 NOTE — Assessment & Plan Note (Signed)
Culture pending

## 2018-10-03 NOTE — Assessment & Plan Note (Signed)
Disc in detail risks of smoking and possible outcomes including copd, vascular/ heart disease, cancer , respiratory and sinus infections  Pt voices understanding Not interested in quitting yet  Denies sob

## 2018-10-06 MED ORDER — CIPROFLOXACIN HCL 250 MG PO TABS: 250.0000 mg | ORAL_TABLET | Freq: Two times a day (BID) | ORAL | 0 refills | Status: DC

## 2018-10-06 NOTE — Telephone Encounter (Signed)
[name] notified Rx sent and advise of Dr. Marliss Coots comments and she verbalized understanding

## 2018-10-06 NOTE — Telephone Encounter (Signed)
-----   Message from Abner Greenspan, MD sent at 10/06/2018 12:57 PM EST ----- Please send in cipro 250 mg 1 po bid for uti #14 no refills  If any side effects please alert me  If symptoms do not begin to improve in several days also alert me  Enc a good fluid intake

## 2018-10-14 ENCOUNTER — Ambulatory Visit (INDEPENDENT_AMBULATORY_CARE_PROVIDER_SITE_OTHER)
Admission: RE | Admit: 2018-10-14 | Discharge: 2018-10-14 | Disposition: A | Discharge status: Home / Self Care | Payer: Medicare Other | Source: Ambulatory Visit | Attending: Family Medicine | Admitting: Family Medicine

## 2018-10-14 ENCOUNTER — Encounter: Payer: Self-pay | Admitting: Family Medicine

## 2018-10-14 ENCOUNTER — Ambulatory Visit (INDEPENDENT_AMBULATORY_CARE_PROVIDER_SITE_OTHER): Payer: Medicare Other | Admitting: Family Medicine

## 2018-10-14 VITALS — BP 108/68 | HR 71 | Temp 98.1°F | Ht 66.0 in | Wt 196.0 lb

## 2018-10-14 DIAGNOSIS — F17200 Nicotine dependence, unspecified, uncomplicated: Secondary | ICD-10-CM

## 2018-10-14 DIAGNOSIS — I6523 Occlusion and stenosis of bilateral carotid arteries: Secondary | ICD-10-CM

## 2018-10-14 DIAGNOSIS — I10 Essential (primary) hypertension: Secondary | ICD-10-CM | POA: Diagnosis not present

## 2018-10-14 DIAGNOSIS — M545 Low back pain, unspecified: Secondary | ICD-10-CM

## 2018-10-14 DIAGNOSIS — E890 Postprocedural hypothyroidism: Secondary | ICD-10-CM

## 2018-10-14 DIAGNOSIS — J209 Acute bronchitis, unspecified: Secondary | ICD-10-CM

## 2018-10-14 MED ORDER — METHOCARBAMOL 500 MG PO TABS: 500.0000 mg | ORAL_TABLET | Freq: Three times a day (TID) | ORAL | 1 refills | Status: DC | PRN

## 2018-10-14 NOTE — Progress Notes (Signed)
Subjective:    Patient ID: Regina Harris, female    DOB: [birth year], 82 y.o.   MRN: [id number]  HPI Here for f/u of chronic medical problems   Wt Readings from Last 3 Encounters:  10/14/18 196 lb (88.9 kg)  10/01/18 201 lb 12 oz (91.5 kg)  02/28/18 204 lb 4 oz (92.6 kg)   31.64 kg/m   Last seen 11/27 with fatigue /congestion and general malaise  Also back pain  Was diagnosed with uti and bronchitis tx with doxycycline for bronchitis   Then UCx grew e coli - tx with cipro (due to severity of symptoms incl back pain and malaise and dec po intake )  It was pan sensitive   Today back still hurts  Better when lying down  She has stayed in bed  ? If any better with tx of uti  Tried meloxicam -did not help - tried 8-10 days   Pain is in low back  Both sides- R side is worse  Does not shoot down either leg  No numbness or weakness that is new    BP Readings from Last 3 Encounters:  10/14/18 108/68  10/01/18 126/80  02/28/18 130/80    Chest congestion is better  Pulse ox 92% today  Still has a runny nose   Saw Dr Gabriel Carina oct (now hypothyroid) T3 was 98  T4 was 0.79 TSH was 6.3 (a little high)  Levothyroxine 25 mcg  Some mood/depressive symptoms  Has a lot of family to talk to  - a lot of support   Patient Active Problem List   Diagnosis Date Noted  . Low back pain 10/14/2018  . Acute bronchitis 10/01/2018  . Fatigue 10/01/2018  . Abnormal urinalysis 10/01/2018  . Right leg swelling 06/09/2018  . Right knee pain 06/09/2018  . Baker's cyst of knee, right 06/09/2018  . Encounter for screening mammogram for breast cancer 02/27/2017  . Estrogen deficiency 02/27/2017  . Foot pain, bilateral 02/27/2017  . Hypothyroidism 04/16/2016  . Pedal edema 04/05/2016  . Right rotator cuff tear 03/09/2016  . Hypokalemia 03/01/2016  . Nodule of chest wall 10/06/2014  . Left breast mass 06/30/2014  . Encounter for Medicare annual wellness exam 02/22/2014  . Polycythemia,  secondary 02/22/2014  . GERD (gastroesophageal reflux disease) 08/24/2013  . Lump or mass in breast 05/06/2013  . Post-menopausal 06/24/2012  . Obesity 12/24/2011  . Prediabetes 03/12/2011  . NEOPLASM UNSPECIFIED NATURE DIGESTIVE SYSTEM 07/24/2010  . LUNG NODULE 01/16/2010  . HYPERCHOLESTEROLEMIA, PURE 07/10/2007  . Smoker 03/19/2007  . Essential hypertension 03/19/2007  . Carotid bruit 03/19/2007  . COLONIC POLYPS, HX OF 03/19/2007   Past Medical History:  Diagnosis Date  . Barrett esophagus   . Carotid bruit   . Diabetes mellitus without complication ([place])   . Esophagitis   . Gastritis 2013  . GERD (gastroesophageal reflux disease)   . HH (hiatus hernia)   . History of colonic polyps   . History of repair of right rotator cuff   . Hyperlipidemia   . Hypertension   . Hyperthyroidism 06/09/2018   Per [place] RAI Therapy for Hyperthyroidism order  . Lump or mass in breast   . Pneumonia   . Polycythemia, secondary   . Tobacco abuse    Past Surgical History:  Procedure Laterality Date  . ABDOMINAL HYSTERECTOMY    . APPENDECTOMY    . BREAST BIOPSY Right 1996  . BREAST BIOPSY Left 10/09/2012   Benign breast tissue with focal  fat necrosis and focal periductal chronic inflammation.  Marland Kitchen BREAST BIOPSY Left 10/09/2012   Benign breast tissue with focal fat necrosis and focal periductal chronic inflammation.  Marland Kitchen CATARACT EXTRACTION  3/11   Dr Charise Killian  . CHOLECYSTECTOMY    . COLONOSCOPY  2009  . ESOPHAGOGASTRODUODENOSCOPY (EGD) WITH PROPOFOL N/A 11/02/2015   Procedure: ESOPHAGOGASTRODUODENOSCOPY (EGD) WITH PROPOFOL;  Surgeon: Manya Silvas, MD;  Location: [facility] ENDOSCOPY;  Service: Endoscopy;  Laterality: N/A;  . SHOULDER ARTHROSCOPY WITH OPEN ROTATOR CUFF REPAIR Right 03/05/2016   Procedure: SHOULDER ARTHROSCOPY WITH OPEN ROTATOR CUFF REPAIR;  Surgeon: Earnestine Leys, MD;  Location: [facility] ORS;  Service: Orthopedics;  Laterality: Right;  . UPPER GI ENDOSCOPY  2013   Social History    Tobacco Use  . Smoking status: Current Every Day Smoker    Packs/day: 1.00    Types: Cigarettes  . Smokeless tobacco: Never Used  Substance Use Topics  . Alcohol use: No    Alcohol/week: 0.0 standard drinks  . Drug use: No   Family History  Problem Relation Age of Onset  . Kidney cancer Father   . Diabetes Mother   . Breast cancer Neg Hx    No Known Allergies Current Outpatient Medications on File Prior to Visit  Medication Sig Dispense Refill  . aspirin EC 81 MG tablet Take 81 mg by mouth daily.    Marland Kitchen atorvastatin (LIPITOR) 10 MG tablet Take 1 tablet (10 mg total) by mouth daily. 90 tablet 3  . CALCIUM-VITAMIN D PO Take 1 tablet by mouth 2 (two) times daily.     . furosemide (LASIX) 20 MG tablet TAKE 1 TABLET (20 MG TOTAL) BY MOUTH DAILY. 90 tablet 3  . gabapentin (NEURONTIN) 400 MG capsule Take 1 capsule (400 mg total) by mouth 2 (two) times daily. 180 capsule 3  . hydrochlorothiazide (HYDRODIURIL) 25 MG tablet Take 1 tablet (25 mg total) by mouth daily. 90 tablet 3  . levothyroxine (SYNTHROID, LEVOTHROID) 25 MCG tablet Take 25 mcg by mouth daily before breakfast.    . omeprazole (PRILOSEC) 20 MG capsule Take 1 capsule (20 mg total) by mouth 2 (two) times daily. 180 capsule 3  . potassium chloride SA (KLOR-CON M20) 20 MEQ tablet TAKE 1 TABLET BY MOUTH TWICE (2) DAILY 180 tablet 3  . [DISCONTINUED] potassium chloride (K-DUR) 10 MEQ tablet Take 1 tablet (10 mEq total) by mouth daily. 30 tablet 3   No current facility-administered medications on file prior to visit.      Review of Systems  Constitutional: Positive for fatigue. Negative for activity change, appetite change, fever and unexpected weight change.  HENT: Negative for congestion, ear pain, rhinorrhea, sinus pressure and sore throat.   Eyes: Negative for pain, redness and visual disturbance.  Respiratory: Positive for cough. Negative for chest tightness, shortness of breath, wheezing and stridor.   Cardiovascular:  Negative for chest pain and palpitations.  Gastrointestinal: Negative for abdominal pain, blood in stool, constipation and diarrhea.  Endocrine: Negative for polydipsia and polyuria.  Genitourinary: Negative for dysuria, frequency and urgency.  Musculoskeletal: Positive for back pain. Negative for arthralgias and myalgias.  Skin: Negative for pallor and rash.  Allergic/Immunologic: Negative for environmental allergies.  Neurological: Negative for dizziness, syncope and headaches.  Hematological: Negative for adenopathy. Does not bruise/bleed easily.  Psychiatric/Behavioral: Positive for dysphoric mood. Negative for decreased concentration. The patient is not nervous/anxious.        Objective:   Physical Exam  Constitutional: She appears well-developed and well-nourished. No  distress.  obese and well appearing  Less fatigued than last visit   HENT:  Head: Normocephalic and atraumatic.  Mouth/Throat: Oropharynx is clear and moist.  Eyes: Pupils are equal, round, and reactive to light. Conjunctivae and EOM are normal. No scleral icterus.  Neck: Normal range of motion. Neck supple. No JVD present. Carotid bruit is not present. No thyromegaly present.  Cardiovascular: Normal rate, regular rhythm, normal heart sounds and intact distal pulses. Exam reveals no gallop.  Pulmonary/Chest: Effort normal and breath sounds normal. No stridor. No respiratory distress. She has no wheezes. She has no rales.  No crackles  Diffusely distant bs  No wheezing  No rales or rhonchi  Abdominal: Soft. Bowel sounds are normal. She exhibits no distension, no abdominal bruit and no mass. There is no tenderness.  Musculoskeletal: She exhibits tenderness. She exhibits no edema or deformity.       Right shoulder: She exhibits decreased range of motion, tenderness, bony tenderness and spasm. She exhibits no effusion, no crepitus, no deformity, normal pulse and normal strength.       Lumbar back: She exhibits  decreased range of motion, tenderness and spasm. She exhibits no bony tenderness and no edema.  Tenderness over upper LS and in R sided musculature  Nl rom of ext and hips LS flex 90 deg / ext 10 deg (pain) and nl lateral movement  Nl gait today  No foot drop  No neuro changes   Lymphadenopathy:    She has no cervical adenopathy.  Neurological: She is alert. She has normal strength and normal reflexes. She displays no atrophy. No cranial nerve deficit or sensory deficit. She exhibits normal muscle tone. Coordination normal.  Negative SLR  Skin: Skin is warm and dry. No rash noted. No erythema. No pallor.  Psychiatric: Her speech is normal and behavior is normal. She exhibits a depressed mood.  Tearful at times           Assessment & Plan:   Problem List Items Addressed This Visit      Cardiovascular and Mediastinum   Essential hypertension    bp in fair control at this time  BP Readings from Last 1 Encounters:  10/14/18 108/68   No changes needed Most recent labs reviewed  Disc lifstyle change with low sodium diet and exercise          Respiratory   Acute bronchitis    Improved s/p tx with doxycycline - but pulse ox is still 92% in a smoker  Also fatigue  Denies sob and not interested in smoking cessation  CXR today       Relevant Orders   DG Chest 2 View     Endocrine   Hypothyroidism    Fatigue/some more depressed mood  TSH today  On levothyroxine 25 mcg daily (sees Dr Gabriel Carina)       Relevant Orders   TSH     Other   Smoker    Disc in detail risks of smoking and possible outcomes including copd, vascular/ heart disease, cancer , respiratory and sinus infections  Pt voices understanding Suspect copd Pulse ox 92% on RA CXR today  She is not interested in quitting smoking      Relevant Orders   DG Chest 2 View   Low back pain - Primary    W/o trauma or radiating symptoms Some tenderness of upper lumbar spine and R lumbar musculature No imp with  meloxicam Px methocarbamol to try with caution  Enc  walking (lying down all day worsens symptoms- improved today since she is moving) LS films today  Handout on back pain and also rehab given        Relevant Medications   methocarbamol (ROBAXIN) 500 MG tablet   Other Relevant Orders   DG Lumbar Spine Complete

## 2018-10-14 NOTE — Assessment & Plan Note (Signed)
Disc in detail risks of smoking and possible outcomes including copd, vascular/ heart disease, cancer , respiratory and sinus infections  Pt voices understanding Suspect copd Pulse ox 92% on RA CXR today  She is not interested in quitting smoking

## 2018-10-14 NOTE — Assessment & Plan Note (Signed)
W/o trauma or radiating symptoms Some tenderness of upper lumbar spine and R lumbar musculature No imp with meloxicam Px methocarbamol to try with caution  Enc walking (lying down all day worsens symptoms- improved today since she is moving) LS films today  Handout on back pain and also rehab given

## 2018-10-14 NOTE — Assessment & Plan Note (Signed)
Fatigue/some more depressed mood  TSH today  On levothyroxine 25 mcg daily (sees Dr Gabriel Carina)

## 2018-10-14 NOTE — Assessment & Plan Note (Signed)
bp in fair control at this time  BP Readings from Last 1 Encounters:  10/14/18 108/68   No changes needed Most recent labs reviewed  Disc lifstyle change with low sodium diet and exercise

## 2018-10-14 NOTE — Patient Instructions (Addendum)
Try some gentle heat on low back 10 minutes at a time   Get up and walk more (take breaks)   Take a look at the back stretches /exercises  Avoid heavy lifting   Lumbar spine xray to look for causes of pain  Also chest xray to check in with lungs   Think about quitting smoking as well   Let's check TSH as well today

## 2018-10-14 NOTE — Assessment & Plan Note (Signed)
Improved s/p tx with doxycycline - but pulse ox is still 92% in a smoker  Also fatigue  Denies sob and not interested in smoking cessation  CXR today

## 2018-10-15 LAB — TSH: TSH: 4.53 u[IU]/mL — ABNORMAL HIGH (ref 0.35–4.50)

## 2018-12-03 DIAGNOSIS — E890 Postprocedural hypothyroidism: Secondary | ICD-10-CM | POA: Diagnosis not present

## 2018-12-11 DIAGNOSIS — E890 Postprocedural hypothyroidism: Secondary | ICD-10-CM | POA: Diagnosis not present

## 2018-12-15 ENCOUNTER — Ambulatory Visit (INDEPENDENT_AMBULATORY_CARE_PROVIDER_SITE_OTHER): Payer: Medicare Other | Admitting: Podiatry

## 2018-12-15 ENCOUNTER — Encounter: Payer: Self-pay | Admitting: Podiatry

## 2018-12-15 VITALS — BP 119/81

## 2018-12-15 DIAGNOSIS — M79675 Pain in left toe(s): Secondary | ICD-10-CM

## 2018-12-15 DIAGNOSIS — M79674 Pain in right toe(s): Secondary | ICD-10-CM

## 2018-12-15 DIAGNOSIS — B351 Tinea unguium: Secondary | ICD-10-CM

## 2018-12-15 NOTE — Progress Notes (Signed)
Complaint:  Visit Type: Patient presents  to my office for  preventative foot care services. Complaint: Patient states" my nails have grown long and thick and become painful to walk and wear shoes" . The patient presents for preventative foot care services. No changes to ROS  Podiatric Exam: Vascular: dorsalis pedis and posterior tibial pulses are palpable bilateral. Capillary return is immediate. Temperature gradient is WNL. Skin turgor WNL  Sensorium: Normal Semmes Weinstein monofilament test. Normal tactile sensation bilaterally. Nail Exam: Pt has thick disfigured discolored nails with subungual debris noted bilateral entire nail hallux through fifth toenails Ulcer Exam: There is no evidence of ulcer or pre-ulcerative changes or infection. Orthopedic Exam: Muscle tone and strength are WNL. No limitations in general ROM. No crepitus or effusions noted. Foot type and digits show no abnormalities. Bony prominences are unremarkable. Skin: No Porokeratosis. No infection or ulcers  Diagnosis:  Onychomycosis, , Pain in right toe, pain in left toes  Treatment & Plan Procedures and Treatment: Consent by patient was obtained for treatment procedures.   Debridement of mycotic and hypertrophic toenails, 1 through 5 bilateral and clearing of subungual debris. No ulceration, no infection noted.  Return Visit-Office Procedure: Patient instructed to return to the office for a follow up visit 3 months for continued evaluation and treatment.    Gardiner Barefoot DPM

## 2018-12-16 ENCOUNTER — Other Ambulatory Visit: Payer: Self-pay | Admitting: Family Medicine

## 2018-12-17 NOTE — Telephone Encounter (Signed)
CPE scheduled for 03/02/19, last filled on 10/14/18 #30 tabs with 1 refill, please advise

## 2018-12-24 ENCOUNTER — Encounter: Payer: Self-pay | Admitting: Family Medicine

## 2018-12-24 DIAGNOSIS — R296 Repeated falls: Secondary | ICD-10-CM | POA: Diagnosis not present

## 2018-12-24 DIAGNOSIS — M170 Bilateral primary osteoarthritis of knee: Secondary | ICD-10-CM | POA: Diagnosis not present

## 2018-12-24 DIAGNOSIS — M48061 Spinal stenosis, lumbar region without neurogenic claudication: Secondary | ICD-10-CM | POA: Diagnosis not present

## 2018-12-25 ENCOUNTER — Telehealth: Payer: Self-pay | Admitting: Family Medicine

## 2018-12-25 DIAGNOSIS — M48061 Spinal stenosis, lumbar region without neurogenic claudication: Secondary | ICD-10-CM | POA: Diagnosis not present

## 2018-12-25 DIAGNOSIS — Z9181 History of falling: Secondary | ICD-10-CM | POA: Diagnosis not present

## 2018-12-25 DIAGNOSIS — F17210 Nicotine dependence, cigarettes, uncomplicated: Secondary | ICD-10-CM | POA: Diagnosis not present

## 2018-12-25 DIAGNOSIS — Z87440 Personal history of urinary (tract) infections: Secondary | ICD-10-CM | POA: Diagnosis not present

## 2018-12-25 DIAGNOSIS — G629 Polyneuropathy, unspecified: Secondary | ICD-10-CM | POA: Diagnosis not present

## 2018-12-25 DIAGNOSIS — Z7982 Long term (current) use of aspirin: Secondary | ICD-10-CM | POA: Diagnosis not present

## 2018-12-25 DIAGNOSIS — F0390 Unspecified dementia without behavioral disturbance: Secondary | ICD-10-CM | POA: Diagnosis not present

## 2018-12-25 DIAGNOSIS — M170 Bilateral primary osteoarthritis of knee: Secondary | ICD-10-CM | POA: Diagnosis not present

## 2018-12-25 DIAGNOSIS — E785 Hyperlipidemia, unspecified: Secondary | ICD-10-CM | POA: Diagnosis not present

## 2018-12-25 DIAGNOSIS — R32 Unspecified urinary incontinence: Secondary | ICD-10-CM | POA: Diagnosis not present

## 2018-12-25 DIAGNOSIS — M4856XD Collapsed vertebra, not elsewhere classified, lumbar region, subsequent encounter for fracture with routine healing: Secondary | ICD-10-CM | POA: Diagnosis not present

## 2018-12-25 NOTE — Telephone Encounter (Signed)
Please ok that verbal order  

## 2018-12-25 NOTE — Telephone Encounter (Signed)
Greg/PT w/ [facility] called to get verbal order for PT 2 times a week for 4 weeks. Also requesting occupational therapy evaluation.

## 2018-12-26 NOTE — Telephone Encounter (Signed)
Verbal orders given to [facility]

## 2018-12-30 ENCOUNTER — Ambulatory Visit: Payer: Medicare Other | Admitting: Family Medicine

## 2018-12-30 ENCOUNTER — Encounter: Payer: Self-pay | Admitting: Family Medicine

## 2018-12-30 ENCOUNTER — Telehealth: Payer: Self-pay | Admitting: *Deleted

## 2018-12-30 ENCOUNTER — Telehealth: Payer: Self-pay

## 2018-12-30 ENCOUNTER — Ambulatory Visit (INDEPENDENT_AMBULATORY_CARE_PROVIDER_SITE_OTHER): Payer: Medicare Other | Admitting: Family Medicine

## 2018-12-30 VITALS — BP 126/74 | HR 82 | Temp 98.0°F | Ht 66.0 in | Wt 195.2 lb

## 2018-12-30 DIAGNOSIS — M48061 Spinal stenosis, lumbar region without neurogenic claudication: Secondary | ICD-10-CM | POA: Diagnosis not present

## 2018-12-30 DIAGNOSIS — F0390 Unspecified dementia without behavioral disturbance: Secondary | ICD-10-CM | POA: Diagnosis not present

## 2018-12-30 DIAGNOSIS — F17200 Nicotine dependence, unspecified, uncomplicated: Secondary | ICD-10-CM | POA: Diagnosis not present

## 2018-12-30 DIAGNOSIS — R413 Other amnesia: Secondary | ICD-10-CM | POA: Insufficient documentation

## 2018-12-30 DIAGNOSIS — E785 Hyperlipidemia, unspecified: Secondary | ICD-10-CM | POA: Diagnosis not present

## 2018-12-30 DIAGNOSIS — G629 Polyneuropathy, unspecified: Secondary | ICD-10-CM | POA: Diagnosis not present

## 2018-12-30 DIAGNOSIS — M4856XD Collapsed vertebra, not elsewhere classified, lumbar region, subsequent encounter for fracture with routine healing: Secondary | ICD-10-CM | POA: Diagnosis not present

## 2018-12-30 DIAGNOSIS — M170 Bilateral primary osteoarthritis of knee: Secondary | ICD-10-CM | POA: Diagnosis not present

## 2018-12-30 DIAGNOSIS — R2689 Other abnormalities of gait and mobility: Secondary | ICD-10-CM | POA: Insufficient documentation

## 2018-12-30 MED ORDER — DONEPEZIL HCL 5 MG PO TBDP: 5.0000 mg | ORAL_TABLET | Freq: Every day | ORAL | 11 refills | Status: DC

## 2018-12-30 MED ORDER — DONEPEZIL HCL 5 MG PO TABS: 5.0000 mg | ORAL_TABLET | Freq: Every day | ORAL | 11 refills | Status: DC

## 2018-12-30 NOTE — Progress Notes (Signed)
Subjective:    Patient ID: Regina Harris, female    DOB: 1936/04/07, 83 y.o.   MRN: [id number]  HPI Here for ? About memory loss  Wt Readings from Last 3 Encounters:  12/30/18 195 lb 4 oz (88.6 kg)  10/14/18 196 lb (88.9 kg)  10/01/18 201 lb 12 oz (91.5 kg)   31.51 kg/m   Family emailed indicating they are worried about her short term memory and cognitive funstion  Forgetting more an dmore  Asks the same questions over and over  Forgets to use her walker   Has someone with her around the clock  Niece moved in with her  Allows her to stay home  Wants to stay as independent at possible   Has had some falls (can't get herself back up)  She does have a life line  Home PT is working with her   Continues to smoke   Last CT head was 2017 -showed age related vasc change   Thyroid  Sees Dr Gabriel Carina  Lab Results  Component Value Date   TSH 4.53 (H) 10/14/2018   she increased her levothy to 50 mcg after this and will f/u in 3 mo  keeping it separate    Patient Active Problem List   Diagnosis Date Noted  . Memory loss 12/30/2018  . Poor balance 12/30/2018  . Low back pain 10/14/2018  . Fatigue 10/01/2018  . Abnormal urinalysis 10/01/2018  . Right leg swelling 06/09/2018  . Right knee pain 06/09/2018  . Baker's cyst of knee, right 06/09/2018  . Encounter for screening mammogram for breast cancer 02/27/2017  . Estrogen deficiency 02/27/2017  . Foot pain, bilateral 02/27/2017  . Hypothyroidism 04/16/2016  . Pedal edema 04/05/2016  . Right rotator cuff tear 03/09/2016  . Hypokalemia 03/01/2016  . Nodule of chest wall 10/06/2014  . Left breast mass 06/30/2014  . Encounter for Medicare annual wellness exam 02/22/2014  . Polycythemia, secondary 02/22/2014  . GERD (gastroesophageal reflux disease) 08/24/2013  . Lump or mass in breast 05/06/2013  . Post-menopausal 06/24/2012  . Obesity 12/24/2011  . Prediabetes 03/12/2011  . NEOPLASM UNSPECIFIED NATURE DIGESTIVE SYSTEM  07/24/2010  . LUNG NODULE 01/16/2010  . HYPERCHOLESTEROLEMIA, PURE 07/10/2007  . Smoker 03/19/2007  . Essential hypertension 03/19/2007  . Carotid bruit 03/19/2007  . COLONIC POLYPS, HX OF 03/19/2007   Past Medical History:  Diagnosis Date  . Barrett esophagus   . Carotid bruit   . Diabetes mellitus without complication ([place])   . Esophagitis   . Gastritis 2013  . GERD (gastroesophageal reflux disease)   . HH (hiatus hernia)   . History of colonic polyps   . History of repair of right rotator cuff   . Hyperlipidemia   . Hypertension   . Hyperthyroidism 06/09/2018   Per [place] RAI Therapy for Hyperthyroidism order  . Lump or mass in breast   . Pneumonia   . Polycythemia, secondary   . Tobacco abuse    Past Surgical History:  Procedure Laterality Date  . ABDOMINAL HYSTERECTOMY    . APPENDECTOMY    . BREAST BIOPSY Right 1996  . BREAST BIOPSY Left 10/09/2012   Benign breast tissue with focal fat necrosis and focal periductal chronic inflammation.  Marland Kitchen BREAST BIOPSY Left 10/09/2012   Benign breast tissue with focal fat necrosis and focal periductal chronic inflammation.  Marland Kitchen CATARACT EXTRACTION  3/11   Dr Charise Killian  . CHOLECYSTECTOMY    . COLONOSCOPY  2009  . ESOPHAGOGASTRODUODENOSCOPY (EGD) WITH  PROPOFOL N/A 11/02/2015   Procedure: ESOPHAGOGASTRODUODENOSCOPY (EGD) WITH PROPOFOL;  Surgeon: Manya Silvas, MD;  Location[facility] ENDOSCOPY;  Service: Endoscopy;  Laterality: N/A;  . SHOULDER ARTHROSCOPY WITH OPEN ROTATOR CUFF REPAIR Right 03/05/2016   Procedure: SHOULDER ARTHROSCOPY WITH OPEN ROTATOR CUFF REPAIR;  Surgeon: Earnestine Leys, MD;  Location: [facility] ORS;  Service: Orthopedics;  Laterality: Right;  . UPPER GI ENDOSCOPY  2013   Social History   Tobacco Use  . Smoking status: Current Every Day Smoker    Packs/day: 1.00    Types: Cigarettes  . Smokeless tobacco: Never Used  Substance Use Topics  . Alcohol use: No    Alcohol/week: 0.0 standard drinks  . Drug use: No   Family  History  Problem Relation Age of Onset  . Kidney cancer Father   . Diabetes Mother   . Breast cancer Neg Hx    No Known Allergies Current Outpatient Medications on File Prior to Visit  Medication Sig Dispense Refill  . aspirin EC 81 MG tablet Take 81 mg by mouth daily.    Marland Kitchen atorvastatin (LIPITOR) 10 MG tablet Take 1 tablet (10 mg total) by mouth daily. 90 tablet 3  . Calcium Carb-Cholecalciferol (CALCIUM 1000 + D PO) Take 1 capsule by mouth daily.    . furosemide (LASIX) 20 MG tablet TAKE 1 TABLET (20 MG TOTAL) BY MOUTH DAILY. 90 tablet 3  . gabapentin (NEURONTIN) 400 MG capsule Take 1 capsule (400 mg total) by mouth 2 (two) times daily. 180 capsule 3  . hydrochlorothiazide (HYDRODIURIL) 25 MG tablet Take 1 tablet (25 mg total) by mouth daily. 90 tablet 3  . levothyroxine (SYNTHROID, LEVOTHROID) 50 MCG tablet Take 50 mcg by mouth daily before breakfast.     . loratadine (CLARITIN) 10 MG tablet Take 10 mg by mouth daily.    . methocarbamol (ROBAXIN) 500 MG tablet TAKE 1 TABLET BY MOUTH 3 TIMES DAILY AS NEEDED FOR MUSCLE SPASMS (FORMODERATE TO SEVERE BACK PAIN) 30 tablet 3  . omeprazole (PRILOSEC) 20 MG capsule Take 1 capsule (20 mg total) by mouth 2 (two) times daily. 180 capsule 3  . potassium chloride SA (K-DUR,KLOR-CON) 20 MEQ tablet Take 20 mEq by mouth daily.     . [DISCONTINUED] potassium chloride (K-DUR) 10 MEQ tablet Take 1 tablet (10 mEq total) by mouth daily. 30 tablet 3   No current facility-administered medications on file prior to visit.       Review of Systems  Constitutional: Positive for fatigue. Negative for activity change, appetite change, fever and unexpected weight change.  HENT: Negative for congestion, ear pain, rhinorrhea, sinus pressure and sore throat.   Eyes: Negative for pain, redness and visual disturbance.  Respiratory: Negative for cough, shortness of breath and wheezing.        Poor endurance  Cardiovascular: Negative for chest pain and palpitations.    Gastrointestinal: Negative for abdominal pain, blood in stool, constipation and diarrhea.  Endocrine: Negative for polydipsia and polyuria.  Genitourinary: Negative for dysuria, frequency and urgency.  Musculoskeletal: Negative for arthralgias, back pain and myalgias.  Skin: Negative for pallor and rash.  Allergic/Immunologic: Negative for environmental allergies.  Neurological: Negative for dizziness, syncope and headaches.       Poor balance with falls  Hematological: Negative for adenopathy. Does not bruise/bleed easily.  Psychiatric/Behavioral: Positive for decreased concentration. Negative for confusion and dysphoric mood. The patient is not nervous/anxious.        Memory loss short term  Objective:   Physical Exam Constitutional:      General: She is not in acute distress.    Appearance: Normal appearance. She is obese. She is not ill-appearing or diaphoretic.     Comments: Frail appearing   HENT:     Head: Normocephalic and atraumatic.     Mouth/Throat:     Mouth: Mucous membranes are moist.  Eyes:     Extraocular Movements: Extraocular movements intact.     Conjunctiva/sclera: Conjunctivae normal.     Pupils: Pupils are equal, round, and reactive to light.  Skin:    Coloration: Skin is not jaundiced or pale.  Neurological:     Mental Status: She is alert. Mental status is at baseline.     Comments: Requires some assist with gait due to unsteadiness   Psychiatric:        Mood and Affect: Mood normal.        Speech: Speech normal.        Behavior: Behavior normal.        Thought Content: Thought content is not paranoid or delusional.        Cognition and Memory: She exhibits impaired recent memory.     Comments: MMS conducted over 20 minute span  Score 22/30  Clock draw is fair (hands are mis shaped)  Supportive family present  Pt does occ repeat herself but answers questions appropriately Mood seems good today            Assessment & Plan:   Problem  List Items Addressed This Visit      Other   Memory loss - Primary (Chronic)    Here for concerns of short term memory loss per family  MMS score 65/30 with fair clock draw today  Disc likely MCI that is age related 1 last CT 2017 - age related vasc changes She is at high risk for vasc dz and cva due to smoking  Did check B12 level today to make sure a deficiency is not in play  Rev last labs  Given into on aricept and px to start 5 mg daily  Disc poss side eff incl nausea and mood change (will update)  Enc socialization/reading/puzzles and getting out  Will see her back in April and adv dose if well tolerated       Relevant Orders   Vitamin B12   Smoker    Disc in detail risks of smoking and possible outcomes including copd, vascular/ heart disease, cancer , respiratory and sinus infections  Pt voices understanding Pt has no intent to quit       Poor balance    From deconditioning and age PT and OT started today at home -in hopes it will help

## 2018-12-30 NOTE — Assessment & Plan Note (Signed)
Here for concerns of short term memory loss per family  MMS score 25/30 with fair clock draw today  Disc likely MCI that is age related 69 last CT 2017 - age related vasc changes She is at high risk for vasc dz and cva due to smoking  Did check B12 level today to make sure a deficiency is not in play  Rev last labs  Given into on aricept and px to start 5 mg daily  Disc poss side eff incl nausea and mood change (will update)  Enc socialization/reading/puzzles and getting out  Will see her back in April and adv dose if well tolerated

## 2018-12-30 NOTE — Assessment & Plan Note (Signed)
Disc in detail risks of smoking and possible outcomes including copd, vascular/ heart disease, cancer , respiratory and sinus infections  Pt voices understanding Pt has no intent to quit

## 2018-12-30 NOTE — Telephone Encounter (Signed)
Verbal order given to Shirlean Mylar

## 2018-12-30 NOTE — Patient Instructions (Addendum)
I think you may have some mild cognitive impairment   Keep doing puzzles/reading  Most importantly talk to people and socialize   Take a look - handout on aricept  I will also print a px  This can slow down memory loss over time   We will check a B12 level today to make sure it is not low

## 2018-12-30 NOTE — Assessment & Plan Note (Signed)
From deconditioning and age PT and OT started today at home -in hopes it will help

## 2018-12-30 NOTE — Telephone Encounter (Signed)
Robin OT with [facility] called requesting verbal orders for OT. Shirlean Mylar is requesting twice a week for 3 weeks for upper extremity exercises and ADL's. Shirlean Mylar stated that if she is not able to answer the telephone it is okay to leave the order on her voicemail.

## 2018-12-30 NOTE — Telephone Encounter (Signed)
Regina Harris from [facility] said pt is at pharmacy and wants to start on donepezil today. The order was for aricept ODT. Can the pt take regular tablet. DrTower said yes; Regina Harris took verbal order over phone. Med list updated.

## 2018-12-30 NOTE — Telephone Encounter (Signed)
Please ok those verbal orders  

## 2019-01-01 DIAGNOSIS — M4856XD Collapsed vertebra, not elsewhere classified, lumbar region, subsequent encounter for fracture with routine healing: Secondary | ICD-10-CM | POA: Diagnosis not present

## 2019-01-01 DIAGNOSIS — M48061 Spinal stenosis, lumbar region without neurogenic claudication: Secondary | ICD-10-CM | POA: Diagnosis not present

## 2019-01-01 DIAGNOSIS — M170 Bilateral primary osteoarthritis of knee: Secondary | ICD-10-CM | POA: Diagnosis not present

## 2019-01-01 DIAGNOSIS — E785 Hyperlipidemia, unspecified: Secondary | ICD-10-CM | POA: Diagnosis not present

## 2019-01-01 DIAGNOSIS — G629 Polyneuropathy, unspecified: Secondary | ICD-10-CM | POA: Diagnosis not present

## 2019-01-01 DIAGNOSIS — F0390 Unspecified dementia without behavioral disturbance: Secondary | ICD-10-CM | POA: Diagnosis not present

## 2019-01-01 LAB — VITAMIN B12: VITAMIN B 12: 140 pg/mL — AB (ref 211–911)

## 2019-01-02 DIAGNOSIS — M170 Bilateral primary osteoarthritis of knee: Secondary | ICD-10-CM | POA: Diagnosis not present

## 2019-01-02 DIAGNOSIS — G629 Polyneuropathy, unspecified: Secondary | ICD-10-CM | POA: Diagnosis not present

## 2019-01-02 DIAGNOSIS — M48061 Spinal stenosis, lumbar region without neurogenic claudication: Secondary | ICD-10-CM | POA: Diagnosis not present

## 2019-01-02 DIAGNOSIS — F0390 Unspecified dementia without behavioral disturbance: Secondary | ICD-10-CM | POA: Diagnosis not present

## 2019-01-02 DIAGNOSIS — M4856XD Collapsed vertebra, not elsewhere classified, lumbar region, subsequent encounter for fracture with routine healing: Secondary | ICD-10-CM | POA: Diagnosis not present

## 2019-01-02 DIAGNOSIS — E785 Hyperlipidemia, unspecified: Secondary | ICD-10-CM | POA: Diagnosis not present

## 2019-01-05 ENCOUNTER — Telehealth: Payer: Self-pay | Admitting: *Deleted

## 2019-01-05 DIAGNOSIS — G629 Polyneuropathy, unspecified: Secondary | ICD-10-CM | POA: Diagnosis not present

## 2019-01-05 DIAGNOSIS — M48061 Spinal stenosis, lumbar region without neurogenic claudication: Secondary | ICD-10-CM | POA: Diagnosis not present

## 2019-01-05 DIAGNOSIS — M4856XD Collapsed vertebra, not elsewhere classified, lumbar region, subsequent encounter for fracture with routine healing: Secondary | ICD-10-CM | POA: Diagnosis not present

## 2019-01-05 DIAGNOSIS — E785 Hyperlipidemia, unspecified: Secondary | ICD-10-CM | POA: Diagnosis not present

## 2019-01-05 DIAGNOSIS — E538 Deficiency of other specified B group vitamins: Secondary | ICD-10-CM

## 2019-01-05 DIAGNOSIS — M170 Bilateral primary osteoarthritis of knee: Secondary | ICD-10-CM | POA: Diagnosis not present

## 2019-01-05 DIAGNOSIS — F0390 Unspecified dementia without behavioral disturbance: Secondary | ICD-10-CM | POA: Diagnosis not present

## 2019-01-05 MED ORDER — CYANOCOBALAMIN 1000 MCG/ML IJ SOLN: 1000.0000 ug | INTRAMUSCULAR | 0 refills | Status: DC

## 2019-01-05 MED ORDER — "SYRINGE/NEEDLE (DISP) 25G X 1"" 1 ML MISC": 0 refills | Status: DC

## 2019-01-05 NOTE — Telephone Encounter (Signed)
-----   Message from Abner Greenspan, MD sent at 01/01/2019  3:56 PM EST ----- New B12 deficiency  Released on mychart  Please schedule 4 B12 shots weekly for 4 weeks I also inst family to have her take 1000 mcg B12 otc daily  I will re check her B12 for her April visit  Please add B12 def to her problem list

## 2019-01-05 NOTE — Telephone Encounter (Signed)
Daughter is a Marine scientist so she requested that we send in Rx for B12 and she will give inj at home, Rx sent in and she has also started giving pt b12 orally already problem list and med list updated

## 2019-01-06 DIAGNOSIS — M48061 Spinal stenosis, lumbar region without neurogenic claudication: Secondary | ICD-10-CM | POA: Diagnosis not present

## 2019-01-06 DIAGNOSIS — G629 Polyneuropathy, unspecified: Secondary | ICD-10-CM | POA: Diagnosis not present

## 2019-01-06 DIAGNOSIS — M4856XD Collapsed vertebra, not elsewhere classified, lumbar region, subsequent encounter for fracture with routine healing: Secondary | ICD-10-CM | POA: Diagnosis not present

## 2019-01-06 DIAGNOSIS — F0390 Unspecified dementia without behavioral disturbance: Secondary | ICD-10-CM | POA: Diagnosis not present

## 2019-01-06 DIAGNOSIS — E785 Hyperlipidemia, unspecified: Secondary | ICD-10-CM | POA: Diagnosis not present

## 2019-01-06 DIAGNOSIS — M170 Bilateral primary osteoarthritis of knee: Secondary | ICD-10-CM | POA: Diagnosis not present

## 2019-01-07 DIAGNOSIS — F0390 Unspecified dementia without behavioral disturbance: Secondary | ICD-10-CM | POA: Diagnosis not present

## 2019-01-07 DIAGNOSIS — M4856XD Collapsed vertebra, not elsewhere classified, lumbar region, subsequent encounter for fracture with routine healing: Secondary | ICD-10-CM | POA: Diagnosis not present

## 2019-01-07 DIAGNOSIS — M48061 Spinal stenosis, lumbar region without neurogenic claudication: Secondary | ICD-10-CM | POA: Diagnosis not present

## 2019-01-07 DIAGNOSIS — G629 Polyneuropathy, unspecified: Secondary | ICD-10-CM | POA: Diagnosis not present

## 2019-01-07 DIAGNOSIS — M170 Bilateral primary osteoarthritis of knee: Secondary | ICD-10-CM | POA: Diagnosis not present

## 2019-01-07 DIAGNOSIS — E785 Hyperlipidemia, unspecified: Secondary | ICD-10-CM | POA: Diagnosis not present

## 2019-01-09 DIAGNOSIS — M170 Bilateral primary osteoarthritis of knee: Secondary | ICD-10-CM | POA: Diagnosis not present

## 2019-01-09 DIAGNOSIS — E785 Hyperlipidemia, unspecified: Secondary | ICD-10-CM | POA: Diagnosis not present

## 2019-01-09 DIAGNOSIS — M4856XD Collapsed vertebra, not elsewhere classified, lumbar region, subsequent encounter for fracture with routine healing: Secondary | ICD-10-CM | POA: Diagnosis not present

## 2019-01-09 DIAGNOSIS — F0390 Unspecified dementia without behavioral disturbance: Secondary | ICD-10-CM | POA: Diagnosis not present

## 2019-01-09 DIAGNOSIS — G629 Polyneuropathy, unspecified: Secondary | ICD-10-CM | POA: Diagnosis not present

## 2019-01-09 DIAGNOSIS — M48061 Spinal stenosis, lumbar region without neurogenic claudication: Secondary | ICD-10-CM | POA: Diagnosis not present

## 2019-01-13 DIAGNOSIS — F0390 Unspecified dementia without behavioral disturbance: Secondary | ICD-10-CM | POA: Diagnosis not present

## 2019-01-13 DIAGNOSIS — M4856XD Collapsed vertebra, not elsewhere classified, lumbar region, subsequent encounter for fracture with routine healing: Secondary | ICD-10-CM | POA: Diagnosis not present

## 2019-01-13 DIAGNOSIS — M170 Bilateral primary osteoarthritis of knee: Secondary | ICD-10-CM | POA: Diagnosis not present

## 2019-01-13 DIAGNOSIS — M48061 Spinal stenosis, lumbar region without neurogenic claudication: Secondary | ICD-10-CM | POA: Diagnosis not present

## 2019-01-13 DIAGNOSIS — G629 Polyneuropathy, unspecified: Secondary | ICD-10-CM | POA: Diagnosis not present

## 2019-01-13 DIAGNOSIS — E785 Hyperlipidemia, unspecified: Secondary | ICD-10-CM | POA: Diagnosis not present

## 2019-01-16 DIAGNOSIS — E785 Hyperlipidemia, unspecified: Secondary | ICD-10-CM | POA: Diagnosis not present

## 2019-01-16 DIAGNOSIS — M170 Bilateral primary osteoarthritis of knee: Secondary | ICD-10-CM | POA: Diagnosis not present

## 2019-01-16 DIAGNOSIS — F0390 Unspecified dementia without behavioral disturbance: Secondary | ICD-10-CM | POA: Diagnosis not present

## 2019-01-16 DIAGNOSIS — M4856XD Collapsed vertebra, not elsewhere classified, lumbar region, subsequent encounter for fracture with routine healing: Secondary | ICD-10-CM | POA: Diagnosis not present

## 2019-01-16 DIAGNOSIS — G629 Polyneuropathy, unspecified: Secondary | ICD-10-CM | POA: Diagnosis not present

## 2019-01-16 DIAGNOSIS — M48061 Spinal stenosis, lumbar region without neurogenic claudication: Secondary | ICD-10-CM | POA: Diagnosis not present

## 2019-02-03 ENCOUNTER — Other Ambulatory Visit: Payer: Self-pay | Admitting: Family Medicine

## 2019-02-25 ENCOUNTER — Ambulatory Visit: Payer: Medicare Other

## 2019-03-02 ENCOUNTER — Encounter: Payer: Medicare Other | Admitting: Family Medicine

## 2019-03-03 ENCOUNTER — Other Ambulatory Visit: Payer: Self-pay | Admitting: Family Medicine

## 2019-03-16 ENCOUNTER — Encounter: Payer: Self-pay | Admitting: Podiatry

## 2019-03-16 ENCOUNTER — Other Ambulatory Visit: Payer: Self-pay

## 2019-03-16 ENCOUNTER — Ambulatory Visit (INDEPENDENT_AMBULATORY_CARE_PROVIDER_SITE_OTHER): Payer: Medicare Other | Admitting: Podiatry

## 2019-03-16 VITALS — Temp 96.8°F

## 2019-03-16 DIAGNOSIS — M79674 Pain in right toe(s): Secondary | ICD-10-CM

## 2019-03-16 DIAGNOSIS — M79675 Pain in left toe(s): Secondary | ICD-10-CM | POA: Diagnosis not present

## 2019-03-16 DIAGNOSIS — B351 Tinea unguium: Secondary | ICD-10-CM

## 2019-03-16 NOTE — Progress Notes (Signed)
Complaint:  Visit Type: Patient presents  to my office for  preventative foot care services. Complaint: Patient states" my nails have grown long and thick and become painful to walk and wear shoes" . The patient presents for preventative foot care services. No changes to ROS  Podiatric Exam: Vascular: dorsalis pedis and posterior tibial pulses are palpable bilateral. Capillary return is immediate. Temperature gradient is WNL. Skin turgor WNL  Sensorium: Normal Semmes Weinstein monofilament test. Normal tactile sensation bilaterally. Nail Exam: Pt has thick disfigured discolored nails with subungual debris noted bilateral entire nail hallux through fifth toenails Ulcer Exam: There is no evidence of ulcer or pre-ulcerative changes or infection. Orthopedic Exam: Muscle tone and strength are WNL. No limitations in general ROM. No crepitus or effusions noted. Foot type and digits show no abnormalities. Bony prominences are unremarkable. Skin: No Porokeratosis. No infection or ulcers  Diagnosis:  Onychomycosis, , Pain in right toe, pain in left toes  Treatment & Plan Procedures and Treatment: Consent by patient was obtained for treatment procedures.   Debridement of mycotic and hypertrophic toenails, 1 through 5 bilateral and clearing of subungual debris. No ulceration, no infection noted.  Return Visit-Office Procedure: Patient instructed to return to the office for a follow up visit 3 months for continued evaluation and treatment.    Gardiner Barefoot DPM

## 2019-03-17 DIAGNOSIS — E890 Postprocedural hypothyroidism: Secondary | ICD-10-CM | POA: Diagnosis not present

## 2019-03-26 DIAGNOSIS — E890 Postprocedural hypothyroidism: Secondary | ICD-10-CM | POA: Diagnosis not present

## 2019-04-29 ENCOUNTER — Other Ambulatory Visit: Payer: Self-pay | Admitting: Family Medicine

## 2019-04-29 DIAGNOSIS — Z1231 Encounter for screening mammogram for malignant neoplasm of breast: Secondary | ICD-10-CM

## 2019-06-15 ENCOUNTER — Ambulatory Visit: Payer: Medicare Other | Admitting: Podiatry

## 2019-06-25 ENCOUNTER — Ambulatory Visit (INDEPENDENT_AMBULATORY_CARE_PROVIDER_SITE_OTHER): Payer: Medicare Other | Admitting: Podiatry

## 2019-06-25 ENCOUNTER — Other Ambulatory Visit: Payer: Self-pay

## 2019-06-25 ENCOUNTER — Encounter: Payer: Self-pay | Admitting: Podiatry

## 2019-06-25 VITALS — Temp 97.2°F

## 2019-06-25 DIAGNOSIS — B351 Tinea unguium: Secondary | ICD-10-CM | POA: Diagnosis not present

## 2019-06-25 DIAGNOSIS — M79674 Pain in right toe(s): Secondary | ICD-10-CM

## 2019-06-25 DIAGNOSIS — M79675 Pain in left toe(s): Secondary | ICD-10-CM

## 2019-06-25 NOTE — Progress Notes (Signed)
Complaint:  Visit Type: Patient presents  to my office for  preventative foot care services. Complaint: Patient states" my nails have grown long and thick and become painful to walk and wear shoes" . The patient presents for preventative foot care services. No changes to ROS  Podiatric Exam: Vascular: dorsalis pedis and posterior tibial pulses are palpable bilateral. Capillary return is immediate. Temperature gradient is WNL. Skin turgor WNL  Sensorium: Normal Semmes Weinstein monofilament test. Normal tactile sensation bilaterally. Nail Exam: Pt has thick disfigured discolored nails with subungual debris noted bilateral entire nail hallux through fifth toenails Ulcer Exam: There is no evidence of ulcer or pre-ulcerative changes or infection. Orthopedic Exam: Muscle tone and strength are WNL. No limitations in general ROM. No crepitus or effusions noted. Foot type and digits show no abnormalities. Bony prominences are unremarkable. Skin: No Porokeratosis. No infection or ulcers  Diagnosis:  Onychomycosis, , Pain in right toe, pain in left toes  Treatment & Plan Procedures and Treatment: Consent by patient was obtained for treatment procedures.   Debridement of mycotic and hypertrophic toenails, 1 through 5 bilateral and clearing of subungual debris. No ulceration, no infection noted.  Return Visit-Office Procedure: Patient instructed to return to the office for a follow up visit 3 months for continued evaluation and treatment.    Domenik Trice DPM 

## 2019-07-01 ENCOUNTER — Ambulatory Visit
Admission: RE | Admit: 2019-07-01 | Discharge: 2019-07-01 | Disposition: A | Discharge status: Home / Self Care | Payer: Medicare Other | Source: Ambulatory Visit | Attending: Family Medicine | Admitting: Family Medicine

## 2019-07-01 DIAGNOSIS — Z1231 Encounter for screening mammogram for malignant neoplasm of breast: Secondary | ICD-10-CM | POA: Diagnosis not present

## 2019-07-17 ENCOUNTER — Ambulatory Visit: Payer: Medicare Other

## 2019-07-17 ENCOUNTER — Telehealth: Payer: Self-pay | Admitting: Family Medicine

## 2019-07-17 NOTE — Telephone Encounter (Signed)
She already has appt for labs and PE on 08/05/19 here correct? If so we usually do tsh with labs anyway We could just do those labs early  When does she want to come? Let me know and I will order

## 2019-07-17 NOTE — Telephone Encounter (Signed)
Patient's Granddaughter [name] called today to see if there is any way possible we can do a TSH check on the patient. [facility] was wanting her to be checked for this.  The grand daughter is trying to save the patient from getting labs done so many times.   I advised her that we only draw for [place] office but she wanted a message sent to see if their was anyway possible we could do it

## 2019-07-17 NOTE — Telephone Encounter (Signed)
[name] said pt's appt with Endo is on 08/19/19 so she can just keep CPE lab appt on 08/05/19 and as long as we check her TSH, then it's okay to keep it on that date

## 2019-07-21 ENCOUNTER — Other Ambulatory Visit: Payer: Self-pay | Admitting: Family Medicine

## 2019-07-27 ENCOUNTER — Ambulatory Visit (INDEPENDENT_AMBULATORY_CARE_PROVIDER_SITE_OTHER): Payer: Medicare Other | Admitting: Family Medicine

## 2019-07-27 ENCOUNTER — Encounter: Payer: Self-pay | Admitting: Family Medicine

## 2019-07-27 ENCOUNTER — Other Ambulatory Visit: Payer: Self-pay

## 2019-07-27 ENCOUNTER — Other Ambulatory Visit (INDEPENDENT_AMBULATORY_CARE_PROVIDER_SITE_OTHER): Payer: Medicare Other

## 2019-07-27 VITALS — BP 105/58 | HR 62 | Temp 98.3°F | Ht 66.0 in

## 2019-07-27 DIAGNOSIS — R410 Disorientation, unspecified: Secondary | ICD-10-CM

## 2019-07-27 DIAGNOSIS — R8290 Unspecified abnormal findings in urine: Secondary | ICD-10-CM

## 2019-07-27 DIAGNOSIS — R0981 Nasal congestion: Secondary | ICD-10-CM | POA: Diagnosis not present

## 2019-07-27 DIAGNOSIS — N3000 Acute cystitis without hematuria: Secondary | ICD-10-CM

## 2019-07-27 DIAGNOSIS — N390 Urinary tract infection, site not specified: Secondary | ICD-10-CM | POA: Insufficient documentation

## 2019-07-27 LAB — POC URINALSYSI DIPSTICK (AUTOMATED)
Bilirubin, UA: NEGATIVE
Glucose, UA: NEGATIVE
Ketones, UA: NEGATIVE
Nitrite, UA: POSITIVE
Protein, UA: POSITIVE — AB
Spec Grav, UA: 1.02 (ref 1.010–1.025)
Urobilinogen, UA: 0.2 E.U./dL
pH, UA: 6 (ref 5.0–8.0)

## 2019-07-27 MED ORDER — SULFAMETHOXAZOLE-TRIMETHOPRIM 800-160 MG PO TABS: 1.0000 | ORAL_TABLET | Freq: Two times a day (BID) | ORAL | 0 refills | Status: DC

## 2019-07-27 NOTE — Patient Instructions (Addendum)
Try changing antihistamine from zyrtec to claritin or allegra and see if that works better   Encourage fluids I sent in bactrim ds for the uti  We will call when the urine culture returns Let us know if symptoms worsen in the meantime  We can order a covid test also if she does not improve  Watch for fever or shortness of breath as well

## 2019-07-27 NOTE — Addendum Note (Signed)
Addended by: Carter Kitten on: 07/27/2019 02:28 PM   Modules accepted: Orders

## 2019-07-27 NOTE — Progress Notes (Signed)
Virtual Visit via Video Note  I connected with Regina Harris on 07/27/19 at 12:00 PM EDT by a video enabled telemedicine application and verified that I am speaking with the correct person using two identifiers.  Location: Patient: home Provider: office    I discussed the limitations of evaluation and management by telemedicine and the availability of in person appointments. The patient expressed understanding and agreed to proceed.  History of Present Illness: Pt presents with some urinary symptoms and congestion  Also more confusion than usual  Says she does not feel good  Some congestion  Baseline junky smokers cough- may be worse than usual  Nonproductive   Was taking zyrtec  Took a benadryl on Friday night   Still smoking (has not been open to quitting)    Friday -family called her to see if she wanted breakfast  She was confused and thought her mother was there (deceased)  Takes aricept   More incontinence than usual -esp when sleeping (does wear a pad)  No dysuria or back pain or other symptoms  Hard to get her to drink fluids     No fever  Cheeks are pink  Oral 98.3 , then axillary 98.7   No covid exposure  Does not go out much  Limited visitors   Urine is positive   Ua: Results for orders placed or performed in visit on 07/27/19  POCT Urinalysis Dipstick (Automated)  Result Value Ref Range   Color, UA yellow    Clarity, UA cloudy    Glucose, UA Negative Negative   Bilirubin, UA negative    Ketones, UA negative    Spec Grav, UA 1.020 1.010 - 1.025   Blood, UA 1+    pH, UA 6.0 5.0 - 8.0   Protein, UA Positive (A) Negative   Urobilinogen, UA 0.2 0.2 or 1.0 E.U./dL   Nitrite, UA Positive    Leukocytes, UA Large (3+) (A) Negative    Review of Systems  Constitutional: Positive for malaise/fatigue. Negative for chills, diaphoresis, fever and weight loss.  HENT: Positive for congestion. Negative for sinus pain and sore throat.   Eyes: Negative for  discharge and redness.  Respiratory: Positive for cough. Negative for sputum production and shortness of breath.   Cardiovascular: Negative for chest pain and palpitations.  Gastrointestinal: Negative for abdominal pain, diarrhea, nausea and vomiting.  Genitourinary: Positive for frequency. Negative for dysuria and hematuria.  Skin: Negative for itching and rash.  Neurological: Negative for dizziness and headaches.  Psychiatric/Behavioral:       More confusion than usual      Patient Active Problem List   Diagnosis Date Noted  . UTI (urinary tract infection) 07/27/2019  . B12 deficiency 01/05/2019  . Memory loss 12/30/2018  . Poor balance 12/30/2018  . Low back pain 10/14/2018  . Fatigue 10/01/2018  . Abnormal urinalysis 10/01/2018  . 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 Right leg swelling 06/09/2018  . Right knee pain 06/09/2018  . Baker's cyst of knee, right 06/09/2018  . Encounter for screening mammogram for breast cancer 02/27/2017  . Estrogen deficiency 02/27/2017  . Foot pain, bilateral 02/27/2017  . Hypothyroidism 04/16/2016  . Pedal edema 04/05/2016  . Right rotator cuff tear 03/09/2016  . Hypokalemia 03/01/2016  . Nodule of chest wall 10/06/2014  . Left breast mass 06/30/2014  . Encounter for Medicare annual wellness exam 02/22/2014  . Polycythemia, secondary 02/22/2014  . GERD (gastroesophageal reflux disease) 08/24/2013  . Lump or mass in breast 05/06/2013  .  Post-menopausal 06/24/2012  . Obesity 12/24/2011  . Prediabetes 03/12/2011  . Hyperglycemia 03/12/2011  . NEOPLASM UNSPECIFIED NATURE DIGESTIVE SYSTEM 07/24/2010  . LUNG NODULE 01/16/2010  . HYPERCHOLESTEROLEMIA, PURE 07/10/2007  . Smoker 03/19/2007  . Essential hypertension 03/19/2007  . Carotid bruit 03/19/2007  . COLONIC POLYPS, HX OF 03/19/2007   Past Medical History:  Diagnosis Date  . Barrett esophagus   . Carotid bruit   . Diabetes mellitus without complication ([place])   . Esophagitis    . Gastritis 2013  . GERD (gastroesophageal reflux disease)   . HH (hiatus hernia)   . History of colonic polyps   . History of repair of right rotator cuff   . Hyperlipidemia   . Hypertension   . Hyperthyroidism 06/09/2018   Per [place] RAI Therapy for Hyperthyroidism order  . Lump or mass in breast   . Pneumonia   . Polycythemia, secondary   . Tobacco abuse    Past Surgical History:  Procedure Laterality Date  . ABDOMINAL HYSTERECTOMY    . APPENDECTOMY    . BREAST BIOPSY Right 1996  . BREAST BIOPSY Left 10/09/2012   Benign breast tissue with focal fat necrosis and focal periductal chronic inflammation.  Marland Kitchen BREAST BIOPSY Left 10/09/2012   Benign breast tissue with focal fat necrosis and focal periductal chronic inflammation.  Marland Kitchen CATARACT EXTRACTION  3/11   Dr Charise Killian  . CHOLECYSTECTOMY    . COLONOSCOPY  2009  . ESOPHAGOGASTRODUODENOSCOPY (EGD) WITH PROPOFOL N/A 11/02/2015   Procedure: ESOPHAGOGASTRODUODENOSCOPY (EGD) WITH PROPOFOL;  Surgeon: Manya Silvas, MD;  Location: [facility] ENDOSCOPY;  Service: Endoscopy;  Laterality: N/A;  . SHOULDER ARTHROSCOPY WITH OPEN ROTATOR CUFF REPAIR Right 03/05/2016   Procedure: SHOULDER ARTHROSCOPY WITH OPEN ROTATOR CUFF REPAIR;  Surgeon: Earnestine Leys, MD;  Location: [facility] ORS;  Service: Orthopedics;  Laterality: Right;  . UPPER GI ENDOSCOPY  2013   Social History   Tobacco Use  . Smoking status: Current Every Day Smoker    Packs/day: 1.00    Types: Cigarettes  . Smokeless tobacco: Never Used  Substance Use Topics  . Alcohol use: No    Alcohol/week: 0.0 standard drinks  . Drug use: No   Family History  Problem Relation Age of Onset  . Kidney cancer Father   . Diabetes Mother   . Breast cancer Neg Hx    No Known Allergies Current Outpatient Medications on File Prior to Visit  Medication Sig Dispense Refill  . aspirin EC 81 MG tablet Take 81 mg by mouth daily.    Marland Kitchen atorvastatin (LIPITOR) 10 MG tablet TAKE 1 TABLET BY MOUTH ONCE A DAY 90  tablet 1  . Calcium Carb-Cholecalciferol (CALCIUM 1000 + D PO) Take 1 capsule by mouth daily.    . cyanocobalamin 1000 MCG tablet Take 1,000 mcg by mouth daily.    Marland Kitchen donepezil (ARICEPT) 5 MG tablet Take 1 tablet (5 mg total) by mouth at bedtime. 30 tablet 11  . furosemide (LASIX) 20 MG tablet TAKE 1 TABLET BY MOUTH ONCE A DAY 90 tablet 1  . gabapentin (NEURONTIN) 400 MG capsule TAKE 1 CAPSULE BY MOUTH TWICE DAILY 180 capsule 1  . hydrochlorothiazide (HYDRODIURIL) 25 MG tablet Take 1 tablet (25 mg total) by mouth daily. 90 tablet 3  . levothyroxine (SYNTHROID, LEVOTHROID) 50 MCG tablet Take 50 mcg by mouth daily before breakfast.     . loratadine (CLARITIN) 10 MG tablet Take 10 mg by mouth daily.    Marland Kitchen omeprazole (PRILOSEC) 20 MG capsule  TAKE 1 CAPSULE BY MOUTH TWICE DAILY 180 capsule 1  . potassium chloride SA (K-DUR) 20 MEQ tablet TAKE 1 TABLET BY MOUTH TWICE A DAY 180 tablet 1  . cyanocobalamin (,VITAMIN B-12,) 1000 MCG/ML injection Inject 1 mL (1,000 mcg total) into the muscle once a week. For four weeks 4 mL 0  . methocarbamol (ROBAXIN) 500 MG tablet TAKE 1 TABLET BY MOUTH 3 TIMES DAILY AS NEEDED FOR MUSCLE SPASMS (FORMODERATE TO SEVERE BACK PAIN) (Patient not taking: Reported on 07/27/2019) 30 tablet 3  . Syringe/Needle, Disp, 25G X 1" 1 ML MISC Use go give B12 injection once a week for 4 weeks 4 each 0  . [DISCONTINUED] potassium chloride (K-DUR) 10 MEQ tablet Take 1 tablet (10 mEq total) by mouth daily. 30 tablet 3   No current facility-administered medications on file prior to visit.     Observations/Objective: Patient appears tired, lying in bed  She answers questions appropriately but is otherwise quiet  Weight is baseline  No facial swelling or asymmetry Normal voice-not hoarse and no slurred speech No obvious tremor or mobility impairment Moving neck and UEs normally Able to hear the call well  No cough or shortness of breath during interview  No skin changes on face or neck ,  no rash or pallor Affect is normal    Assessment and Plan: Problem List Items Addressed This Visit      Genitourinary   UTI (urinary tract infection) - Primary    Pos ua with some confusion and more urinary incontinence as well as malaise  cx pending  Cover with bactrim DS Enc fluids Pending cx result inst to call if symptoms worsen in the meantime      Relevant Medications   sulfamethoxazole-trimethoprim (BACTRIM DS) 800-160 MG tablet   Other Relevant Orders   Urine Culture     Other   Nasal congestion    Suspect due to allergic rhinitis  Also has smokers cough  Not interested in quitting smoking  Will try change of antihistamine from zyrtec to claritin or allegra  Avoid allergens Low threshold to covid test if no imp          Follow Up Instructions: Try changing antihistamine from zyrtec to claritin or allegra and see if that works better   [employer] fluids I sent in bactrim ds for the uti  We will call when the urine culture returns Let us know if symptoms worsen in the meantime  We can order a covid test also if she does not improve  Watch for fever or shortness of breath as well   I discussed the assessment and treatment plan with the patient. The patient was provided an opportunity to ask questions and all were answered. The patient agreed with the plan and demonstrated an understanding of the instructions.   The patient was advised to call back or seek an in-person evaluation if the symptoms worsen or if the condition fails to improve as anticipated.     Loura Pardon, MD

## 2019-07-27 NOTE — Assessment & Plan Note (Signed)
Pos ua with some confusion and more urinary incontinence as well as malaise  cx pending  Cover with bactrim DS Enc fluids Pending cx result inst to call if symptoms worsen in the meantime

## 2019-07-27 NOTE — Assessment & Plan Note (Signed)
Suspect due to allergic rhinitis  Also has smokers cough  Not interested in quitting smoking  Will try change of antihistamine from zyrtec to claritin or allegra  Avoid allergens Low threshold to covid test if no imp

## 2019-07-29 ENCOUNTER — Other Ambulatory Visit: Payer: Self-pay

## 2019-07-29 ENCOUNTER — Emergency Department: Payer: Medicare Other

## 2019-07-29 ENCOUNTER — Encounter: Payer: Self-pay | Admitting: Family Medicine

## 2019-07-29 ENCOUNTER — Emergency Department
Admission: EM | Admit: 2019-07-29 | Discharge: 2019-07-29 | Disposition: A | Discharge status: Other Acute Inpatient Hospital | Payer: Medicare Other | Attending: Emergency Medicine | Admitting: Emergency Medicine

## 2019-07-29 ENCOUNTER — Encounter: Payer: Self-pay | Admitting: Emergency Medicine

## 2019-07-29 ENCOUNTER — Inpatient Hospital Stay (HOSPITAL_COMMUNITY)
Admission: AD | Admit: 2019-07-29 | Discharge: 2019-08-06 | DRG: 177 | Disposition: A | Discharge status: Skilled Nursing Facility | Payer: Medicare Other | Attending: Internal Medicine | Admitting: Internal Medicine

## 2019-07-29 DIAGNOSIS — Z7401 Bed confinement status: Secondary | ICD-10-CM | POA: Diagnosis not present

## 2019-07-29 DIAGNOSIS — Z79899 Other long term (current) drug therapy: Secondary | ICD-10-CM

## 2019-07-29 DIAGNOSIS — R410 Disorientation, unspecified: Secondary | ICD-10-CM | POA: Diagnosis not present

## 2019-07-29 DIAGNOSIS — Z8051 Family history of malignant neoplasm of kidney: Secondary | ICD-10-CM | POA: Diagnosis not present

## 2019-07-29 DIAGNOSIS — Z833 Family history of diabetes mellitus: Secondary | ICD-10-CM | POA: Diagnosis not present

## 2019-07-29 DIAGNOSIS — K219 Gastro-esophageal reflux disease without esophagitis: Secondary | ICD-10-CM | POA: Diagnosis present

## 2019-07-29 DIAGNOSIS — F17210 Nicotine dependence, cigarettes, uncomplicated: Secondary | ICD-10-CM | POA: Diagnosis present

## 2019-07-29 DIAGNOSIS — N390 Urinary tract infection, site not specified: Secondary | ICD-10-CM | POA: Diagnosis not present

## 2019-07-29 DIAGNOSIS — Z90710 Acquired absence of both cervix and uterus: Secondary | ICD-10-CM

## 2019-07-29 DIAGNOSIS — F0280 Dementia in other diseases classified elsewhere without behavioral disturbance: Secondary | ICD-10-CM | POA: Diagnosis not present

## 2019-07-29 DIAGNOSIS — Z7982 Long term (current) use of aspirin: Secondary | ICD-10-CM | POA: Insufficient documentation

## 2019-07-29 DIAGNOSIS — M6281 Muscle weakness (generalized): Secondary | ICD-10-CM | POA: Diagnosis not present

## 2019-07-29 DIAGNOSIS — B9620 Unspecified Escherichia coli [E. coli] as the cause of diseases classified elsewhere: Secondary | ICD-10-CM | POA: Diagnosis present

## 2019-07-29 DIAGNOSIS — U071 COVID-19: Secondary | ICD-10-CM | POA: Diagnosis not present

## 2019-07-29 DIAGNOSIS — J1282 Pneumonia due to coronavirus disease 2019: Secondary | ICD-10-CM | POA: Diagnosis present

## 2019-07-29 DIAGNOSIS — F0390 Unspecified dementia without behavioral disturbance: Secondary | ICD-10-CM | POA: Diagnosis present

## 2019-07-29 DIAGNOSIS — E890 Postprocedural hypothyroidism: Secondary | ICD-10-CM | POA: Diagnosis present

## 2019-07-29 DIAGNOSIS — R05 Cough: Secondary | ICD-10-CM | POA: Diagnosis not present

## 2019-07-29 DIAGNOSIS — R278 Other lack of coordination: Secondary | ICD-10-CM | POA: Diagnosis not present

## 2019-07-29 DIAGNOSIS — G9349 Other encephalopathy: Secondary | ICD-10-CM

## 2019-07-29 DIAGNOSIS — G9341 Metabolic encephalopathy: Secondary | ICD-10-CM | POA: Diagnosis present

## 2019-07-29 DIAGNOSIS — E119 Type 2 diabetes mellitus without complications: Secondary | ICD-10-CM | POA: Diagnosis present

## 2019-07-29 DIAGNOSIS — I10 Essential (primary) hypertension: Secondary | ICD-10-CM | POA: Diagnosis not present

## 2019-07-29 DIAGNOSIS — Z720 Tobacco use: Secondary | ICD-10-CM | POA: Diagnosis not present

## 2019-07-29 DIAGNOSIS — E785 Hyperlipidemia, unspecified: Secondary | ICD-10-CM | POA: Diagnosis present

## 2019-07-29 DIAGNOSIS — M2550 Pain in unspecified joint: Secondary | ICD-10-CM | POA: Diagnosis not present

## 2019-07-29 DIAGNOSIS — N3000 Acute cystitis without hematuria: Secondary | ICD-10-CM | POA: Diagnosis not present

## 2019-07-29 DIAGNOSIS — Z79890 Hormone replacement therapy (postmenopausal): Secondary | ICD-10-CM

## 2019-07-29 DIAGNOSIS — E038 Other specified hypothyroidism: Secondary | ICD-10-CM | POA: Diagnosis not present

## 2019-07-29 DIAGNOSIS — E7849 Other hyperlipidemia: Secondary | ICD-10-CM | POA: Diagnosis not present

## 2019-07-29 DIAGNOSIS — D751 Secondary polycythemia: Secondary | ICD-10-CM | POA: Diagnosis present

## 2019-07-29 DIAGNOSIS — R531 Weakness: Secondary | ICD-10-CM | POA: Diagnosis not present

## 2019-07-29 DIAGNOSIS — D6949 Other primary thrombocytopenia: Secondary | ICD-10-CM | POA: Diagnosis not present

## 2019-07-29 DIAGNOSIS — R41841 Cognitive communication deficit: Secondary | ICD-10-CM | POA: Diagnosis not present

## 2019-07-29 DIAGNOSIS — J1289 Other viral pneumonia: Secondary | ICD-10-CM | POA: Diagnosis present

## 2019-07-29 DIAGNOSIS — R1312 Dysphagia, oropharyngeal phase: Secondary | ICD-10-CM | POA: Diagnosis not present

## 2019-07-29 DIAGNOSIS — Z8719 Personal history of other diseases of the digestive system: Secondary | ICD-10-CM | POA: Diagnosis not present

## 2019-07-29 DIAGNOSIS — R059 Cough, unspecified: Secondary | ICD-10-CM

## 2019-07-29 DIAGNOSIS — J80 Acute respiratory distress syndrome: Secondary | ICD-10-CM | POA: Diagnosis not present

## 2019-07-29 LAB — CBC
HCT: 50.8 % — ABNORMAL HIGH (ref 36.0–46.0)
HCT: 48.7 % — ABNORMAL HIGH (ref 36.0–46.0)
Hemoglobin: 16.8 g/dL — ABNORMAL HIGH (ref 12.0–15.0)
Hemoglobin: 16 g/dL — ABNORMAL HIGH (ref 12.0–15.0)
MCH: 30.3 pg (ref 26.0–34.0)
MCH: 30.4 pg (ref 26.0–34.0)
MCHC: 33.1 g/dL (ref 30.0–36.0)
MCHC: 32.9 g/dL (ref 30.0–36.0)
MCV: 91.5 fL (ref 80.0–100.0)
MCV: 92.6 fL (ref 80.0–100.0)
Platelets: 92 10*3/uL — ABNORMAL LOW (ref 150–400)
Platelets: 87 10*3/uL — ABNORMAL LOW (ref 150–400)
RBC: 5.55 MIL/uL — ABNORMAL HIGH (ref 3.87–5.11)
RBC: 5.26 MIL/uL — ABNORMAL HIGH (ref 3.87–5.11)
RDW: 13.4 % (ref 11.5–15.5)
RDW: 13.2 % (ref 11.5–15.5)
WBC: 2.7 10*3/uL — ABNORMAL LOW (ref 4.0–10.5)
WBC: 2.6 10*3/uL — ABNORMAL LOW (ref 4.0–10.5)
nRBC: 0 % (ref 0.0–0.2)
nRBC: 0 % (ref 0.0–0.2)

## 2019-07-29 LAB — C-REACTIVE PROTEIN: CRP: 2 mg/dL — ABNORMAL HIGH (ref <1.0)

## 2019-07-29 LAB — URINE CULTURE
MICRO NUMBER:: 903593
SPECIMEN QUALITY:: ADEQUATE

## 2019-07-29 LAB — URINALYSIS, COMPLETE (UACMP) WITH MICROSCOPIC
Bilirubin Urine: NEGATIVE
Glucose, UA: NEGATIVE mg/dL
Ketones, ur: 5 mg/dL — AB
Nitrite: NEGATIVE
Protein, ur: 30 mg/dL — AB
Specific Gravity, Urine: 1.016 (ref 1.005–1.030)
WBC, UA: >50 WBC/hpf — ABNORMAL HIGH (ref 0–5)
pH: 6 (ref 5.0–8.0)

## 2019-07-29 LAB — ABO/RH: ABO/RH(D): A POS

## 2019-07-29 LAB — CREATININE, SERUM
Creatinine, Ser: 1.05 mg/dL — ABNORMAL HIGH (ref 0.44–1.00)
GFR calc Af Amer: 58 mL/min — ABNORMAL LOW (ref >60)
GFR calc non Af Amer: 50 mL/min — ABNORMAL LOW (ref >60)

## 2019-07-29 LAB — BASIC METABOLIC PANEL
Anion gap: 12 (ref 5–15)
BUN: 19 mg/dL (ref 8–23)
CO2: 25 mmol/L (ref 22–32)
Calcium: 8.8 mg/dL — ABNORMAL LOW (ref 8.9–10.3)
Chloride: 100 mmol/L (ref 98–111)
Creatinine, Ser: 1.05 mg/dL — ABNORMAL HIGH (ref 0.44–1.00)
GFR calc Af Amer: 58 mL/min — ABNORMAL LOW (ref >60)
GFR calc non Af Amer: 50 mL/min — ABNORMAL LOW (ref >60)
Glucose, Bld: 107 mg/dL — ABNORMAL HIGH (ref 70–99)
Potassium: 4.4 mmol/L (ref 3.5–5.1)
Sodium: 137 mmol/L (ref 135–145)

## 2019-07-29 LAB — TSH: TSH: 2.301 u[IU]/mL (ref 0.350–4.500)

## 2019-07-29 LAB — PROCALCITONIN: Procalcitonin: <0.1 ng/mL

## 2019-07-29 LAB — TROPONIN I (HIGH SENSITIVITY)
Troponin I (High Sensitivity): 46 ng/L — ABNORMAL HIGH (ref <18)
Troponin I (High Sensitivity): 11 ng/L (ref <18)

## 2019-07-29 LAB — D-DIMER, QUANTITATIVE: D-Dimer, Quant: 1.18 ug/mL-FEU — ABNORMAL HIGH (ref 0.00–0.50)

## 2019-07-29 LAB — BRAIN NATRIURETIC PEPTIDE: B Natriuretic Peptide: 139.8 pg/mL — ABNORMAL HIGH (ref 0.0–100.0)

## 2019-07-29 LAB — SARS CORONAVIRUS 2 BY RT PCR (HOSPITAL ORDER, PERFORMED IN ~~LOC~~ HOSPITAL LAB): SARS Coronavirus 2: POSITIVE — AB

## 2019-07-29 MED ORDER — DEXAMETHASONE SODIUM PHOSPHATE 10 MG/ML IJ SOLN
6.0000 mg | Freq: Every day | INTRAMUSCULAR | Status: DC
  Administered 2019-07-29 – 2019-07-30 (×2): 6 mg via INTRAVENOUS
  Filled 2019-07-29 (×2): qty 1

## 2019-07-29 MED ORDER — ONDANSETRON HCL 4 MG/2ML IJ SOLN: 4.0000 mg | Freq: Four times a day (QID) | INTRAMUSCULAR | Status: DC | PRN

## 2019-07-29 MED ORDER — ACETAMINOPHEN 650 MG RE SUPP: 650.0000 mg | Freq: Four times a day (QID) | RECTAL | Status: DC | PRN

## 2019-07-29 MED ORDER — SODIUM CHLORIDE 0.9 % IV SOLN
1.0000 g | Freq: Once | INTRAVENOUS | Status: AC
  Administered 2019-07-29: 17:00:00 1 g via INTRAVENOUS
  Filled 2019-07-29: qty 10

## 2019-07-29 MED ORDER — ZINC SULFATE 220 (50 ZN) MG PO CAPS
220.0000 mg | ORAL_CAPSULE | Freq: Every day | ORAL | Status: DC
  Administered 2019-07-29 – 2019-08-06 (×9): 220 mg via ORAL
  Filled 2019-07-29 (×9): qty 1

## 2019-07-29 MED ORDER — SODIUM CHLORIDE 0.9 % IV SOLN: 250.0000 mL | INTRAVENOUS | Status: DC | PRN

## 2019-07-29 MED ORDER — ASPIRIN EC 81 MG PO TBEC
81.0000 mg | DELAYED_RELEASE_TABLET | Freq: Every day | ORAL | Status: DC
  Administered 2019-07-29 – 2019-08-06 (×9): 81 mg via ORAL
  Filled 2019-07-29 (×9): qty 1

## 2019-07-29 MED ORDER — ENOXAPARIN SODIUM 40 MG/0.4ML ~~LOC~~ SOLN
40.0000 mg | SUBCUTANEOUS | Status: DC
  Administered 2019-07-29 – 2019-08-05 (×8): 40 mg via SUBCUTANEOUS
  Filled 2019-07-29 (×8): qty 0.4

## 2019-07-29 MED ORDER — SODIUM CHLORIDE 0.9 % IV SOLN
1.0000 g | INTRAVENOUS | Status: DC
  Administered 2019-07-30 – 2019-08-03 (×5): 1 g via INTRAVENOUS
  Filled 2019-07-29 (×6): qty 10

## 2019-07-29 MED ORDER — ONDANSETRON HCL 4 MG PO TABS: 4.0000 mg | ORAL_TABLET | Freq: Four times a day (QID) | ORAL | Status: DC | PRN

## 2019-07-29 MED ORDER — SODIUM CHLORIDE 0.9% FLUSH: 3.0000 mL | INTRAVENOUS | Status: DC | PRN

## 2019-07-29 MED ORDER — VITAMIN C 500 MG PO TABS
500.0000 mg | ORAL_TABLET | Freq: Two times a day (BID) | ORAL | Status: DC
  Administered 2019-07-29 – 2019-08-06 (×15): 500 mg via ORAL
  Filled 2019-07-29 (×15): qty 1

## 2019-07-29 MED ORDER — ACETAMINOPHEN 325 MG PO TABS: 650.0000 mg | ORAL_TABLET | Freq: Four times a day (QID) | ORAL | Status: DC | PRN

## 2019-07-29 MED ORDER — SODIUM CHLORIDE 0.9 % IV SOLN
Freq: Once | INTRAVENOUS | Status: AC
  Administered 2019-07-29: 14:00:00 via INTRAVENOUS

## 2019-07-29 NOTE — ED Provider Notes (Addendum)
[facility] Emergency Department Provider Note    ____________________________________________   I have reviewed the triage vital signs and the nursing notes.   HISTORY  Chief Complaint Weakness   History limited by: Dementia, some history obtained from family   HPI Regina Harris is a 83 y.o. female who presents to the emergency department today because of concerns for continued weakness.  Family states that starting roughly 6 days ago the patient had some upper airway congestion.  Patient does have a history of allergies so took a Benadryl.  The next morning she was more profoundly weak.  She stayed weak since then.  Primary care doctor was contacted and a urine sample was sent in and was concerning for urinary tract infection.  Patient was started on Bactrim.  She has now been on Bactrim roughly 48 hours and family has not noticed any change.  Patient continues to be extremely weak and having trouble getting out of bed.  No fevers.  Patient has had a cough.   Records reviewed. Per medical record review patient has a history of DM, pneumonia.   Past Medical History:  Diagnosis Date  . Barrett esophagus   . Carotid bruit   . Diabetes mellitus without complication ([place])   . Esophagitis   . Gastritis 2013  . GERD (gastroesophageal reflux disease)   . HH (hiatus hernia)   . History of colonic polyps   . History of repair of right rotator cuff   . Hyperlipidemia   . Hypertension   . Hyperthyroidism 06/09/2018   Per [facility] Therapy for Hyperthyroidism order  . Lump or mass in breast   . Pneumonia   . Polycythemia, secondary   . Tobacco abuse     Patient Active Problem List   Diagnosis Date Noted  . UTI (urinary tract infection) 07/27/2019  . Nasal congestion 07/27/2019  . B12 deficiency 01/05/2019  . Memory loss 12/30/2018  . Poor balance 12/30/2018  . Low back pain 10/14/2018  . Fatigue 10/01/2018  . Abnormal urinalysis 10/01/2018  .  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 Right leg swelling 06/09/2018  . Right knee pain 06/09/2018  . Baker's cyst of knee, right 06/09/2018  . Encounter for screening mammogram for breast cancer 02/27/2017  . Estrogen deficiency 02/27/2017  . Foot pain, bilateral 02/27/2017  . Hypothyroidism 04/16/2016  . Pedal edema 04/05/2016  . Right rotator cuff tear 03/09/2016  . Hypokalemia 03/01/2016  . Nodule of chest wall 10/06/2014  . Left breast mass 06/30/2014  . Encounter for Medicare annual wellness exam 02/22/2014  . Polycythemia, secondary 02/22/2014  . GERD (gastroesophageal reflux disease) 08/24/2013  . Lump or mass in breast 05/06/2013  . Post-menopausal 06/24/2012  . Obesity 12/24/2011  . Prediabetes 03/12/2011  . Hyperglycemia 03/12/2011  . NEOPLASM UNSPECIFIED NATURE DIGESTIVE SYSTEM 07/24/2010  . LUNG NODULE 01/16/2010  . HYPERCHOLESTEROLEMIA, PURE 07/10/2007  . Smoker 03/19/2007  . Essential hypertension 03/19/2007  . Carotid bruit 03/19/2007  . COLONIC POLYPS, HX OF 03/19/2007    Past Surgical History:  Procedure Laterality Date  . ABDOMINAL HYSTERECTOMY    . APPENDECTOMY    . BREAST BIOPSY Right 1996  . BREAST BIOPSY Left 10/09/2012   Benign breast tissue with focal fat necrosis and focal periductal chronic inflammation.  Marland Kitchen BREAST BIOPSY Left 10/09/2012   Benign breast tissue with focal fat necrosis and focal periductal chronic inflammation.  Marland Kitchen CATARACT EXTRACTION  3/11   Dr Charise Killian  . CHOLECYSTECTOMY    .  COLONOSCOPY  2009  . ESOPHAGOGASTRODUODENOSCOPY (EGD) WITH PROPOFOL N/A 11/02/2015   Procedure: ESOPHAGOGASTRODUODENOSCOPY (EGD) WITH PROPOFOL;  Surgeon: Manya Silvas, MD;  Location: [facility] ENDOSCOPY;  Service: Endoscopy;  Laterality: N/A;  . SHOULDER ARTHROSCOPY WITH OPEN ROTATOR CUFF REPAIR Right 03/05/2016   Procedure: SHOULDER ARTHROSCOPY WITH OPEN ROTATOR CUFF REPAIR;  Surgeon: Earnestine Leys, MD;  Location: [facility] ORS;  Service: Orthopedics;  Laterality:  Right;  . UPPER GI ENDOSCOPY  2013    Prior to Admission medications   Medication Sig Start Date End Date Taking? Authorizing Provider  aspirin EC 81 MG tablet Take 81 mg by mouth daily.    [provider]  atorvastatin (LIPITOR) 10 MG tablet TAKE 1 TABLET BY MOUTH ONCE A DAY 03/03/19   Tower, Wynelle Fanny, MD  Calcium Carb-Cholecalciferol (CALCIUM 1000 + D PO) Take 1 capsule by mouth daily.    [provider]  cyanocobalamin (,VITAMIN B-12,) 1000 MCG/ML injection Inject 1 mL (1,000 mcg total) into the muscle once a week. For four weeks 01/05/19   Tower, Wynelle Fanny, MD  cyanocobalamin 1000 MCG tablet Take 1,000 mcg by mouth daily.    [provider]  donepezil (ARICEPT) 5 MG tablet Take 1 tablet (5 mg total) by mouth at bedtime. 12/30/18   Tower, Wynelle Fanny, MD  furosemide (LASIX) 20 MG tablet TAKE 1 TABLET BY MOUTH ONCE A DAY 07/22/19   Tower, Wynelle Fanny, MD  gabapentin (NEURONTIN) 400 MG capsule TAKE 1 CAPSULE BY MOUTH TWICE DAILY 03/03/19   Tower, Wynelle Fanny, MD  hydrochlorothiazide (HYDRODIURIL) 25 MG tablet Take 1 tablet (25 mg total) by mouth daily. 02/28/18   Tower, Wynelle Fanny, MD  levothyroxine (SYNTHROID, LEVOTHROID) 50 MCG tablet Take 50 mcg by mouth daily before breakfast.  12/11/18 12/11/19  [provider]  loratadine (CLARITIN) 10 MG tablet Take 10 mg by mouth daily.    [provider]  methocarbamol (ROBAXIN) 500 MG tablet TAKE 1 TABLET BY MOUTH 3 TIMES DAILY AS NEEDED FOR MUSCLE SPASMS (FORMODERATE TO SEVERE BACK PAIN) Patient not taking: Reported on 07/27/2019 12/17/18   Tower, Wynelle Fanny, MD  omeprazole (PRILOSEC) 20 MG capsule TAKE 1 CAPSULE BY MOUTH TWICE DAILY 07/22/19   Tower, Wynelle Fanny, MD  potassium chloride SA (K-DUR) 20 MEQ tablet TAKE 1 TABLET BY MOUTH TWICE A DAY 03/03/19   Tower, Marne A, MD  sulfamethoxazole-trimethoprim (BACTRIM DS) 800-160 MG tablet Take 1 tablet by mouth 2 (two) times daily. 07/27/19   Tower, Wynelle Fanny, MD  Syringe/Needle, Disp, 25G X 1" 1 ML  MISC Use go give B12 injection once a week for 4 weeks 01/05/19   Tower, Roque Lias A, MD  potassium chloride (K-DUR) 10 MEQ tablet Take 1 tablet (10 mEq total) by mouth daily. 01/04/14 05/02/14  Tower, Wynelle Fanny, MD    Allergies Patient has no known allergies.  Family History  Problem Relation Age of Onset  . Kidney cancer Father   . Diabetes Mother   . Breast cancer Neg Hx     Social History Social History   Tobacco Use  . Smoking status: Current Every Day Smoker    Packs/day: 1.00    Types: Cigarettes  . Smokeless tobacco: Never Used  Substance Use Topics  . Alcohol use: No    Alcohol/week: 0.0 standard drinks  . Drug use: No    Review of Systems Constitutional: No fever/chills Eyes: No visual changes. ENT: No sore throat. Cardiovascular: Denies chest pain. Respiratory: Denies shortness of breath. Gastrointestinal:  No abdominal pain.  No nausea, no vomiting.  No diarrhea.   Genitourinary: Negative for dysuria. Musculoskeletal: Negative for back pain. Skin: Negative for rash. Neurological: Negative for headaches, focal weakness or numbness.  ____________________________________________   PHYSICAL EXAM:  VITAL SIGNS: ED Triage Vitals  Enc Vitals Group     BP 07/29/19 1100 (!) 101/49     Pulse Rate 07/29/19 1100 73     Resp 07/29/19 1100 20     Temp 07/29/19 1100 98.2 F (36.8 C)     Temp Source 07/29/19 1100 Oral     SpO2 07/29/19 1100 90 %     Weight 07/29/19 1101 190 lb (86.2 kg)     Height 07/29/19 1101 5\' 7"  (1.702 m)     Head Circumference --      Peak Flow --      Pain Score 07/29/19 1100 0   Constitutional: Alert and oriented.  Eyes: Conjunctivae are normal.  ENT      Head: Normocephalic and atraumatic.      Nose: No congestion/rhinnorhea.      Mouth/Throat: Mucous membranes are moist.      Neck: No stridor. Hematological/Lymphatic/Immunilogical: No cervical lymphadenopathy. Cardiovascular: Normal rate, regular rhythm.  No murmurs, rubs, or gallops.   Respiratory: Normal respiratory effort without tachypnea nor retractions. Breath sounds are clear and equal bilaterally. No wheezes/rales/rhonchi. Gastrointestinal: Soft and non tender. No rebound. No guarding.  Genitourinary: Deferred Musculoskeletal: Normal range of motion in all extremities. No lower extremity edema. Neurologic:  Normal speech and language. Generalized muscle weakness however no focal weakness appreciated.  Skin:  Skin is warm, dry and intact. No rash noted. Psychiatric: Mood and affect are normal. Speech and behavior are normal. Patient exhibits appropriate insight and judgment.  ____________________________________________    LABS (pertinent positives/negatives)  CBC wbc 2.7, hgb 16.8, plt 92 BMP na 137, k 4.4, glu 107, cr 1.05 Trop hs 46 UA hazy, moderate leukocytes, >50 wbc, rare bacteria, wbc clumps present TSH 2.301 ____________________________________________   EKG  I, Nance Pear, attending physician, personally viewed and interpreted this EKG  EKG Time: 1115 Rate: 66 Rhythm: sinus rhythm Axis: normal Intervals: qtc 448 QRS: narrow, low voltage precordial leads ST changes: no st elevation Impression: abnormal ekg  ____________________________________________    RADIOLOGY  CXR Mild bibasilar atelectasis.    ____________________________________________   PROCEDURES  Procedures  ____________________________________________   INITIAL IMPRESSION / ASSESSMENT AND PLAN / ED COURSE  Pertinent labs & imaging results that were available during my care of the patient were reviewed by me and considered in my medical decision making (see chart for details).   Patient presented to the emergency department today because of concerns for continued weakness.  Patient has been on antibiotics for UTI for the past 2 days.  Urine today still with signs concerning for infection.  Additionally troponin was minimally elevated at 46.  Given the fact  that the patient has not improved on oral and antibiotics will plan on IV antibiotics and admission to the hospital.  Patient did come back positive for COVID.  Will recheck to [facility].  Discussed possibility of transfer with patient and family.  ___________________________________________   FINAL CLINICAL IMPRESSION(S) / ED DIAGNOSES  Final diagnoses:  Lower urinary tract infectious disease  Generalized weakness     Note: This dictation was prepared with Dragon dictation. Any transcriptional errors that result from this process are unintentional     Nance Pear, MD 07/29/19 [place],  MD 07/29/19 [id number]

## 2019-07-29 NOTE — ED Notes (Addendum)
Family states pt is altered mental status, and weakness that started Saturday. Pt alert to person and place at this time. Diagnosed with UTI on Monday, started Bactrim. Denies fever.

## 2019-07-29 NOTE — Progress Notes (Signed)
This nurse spoke to patients grandaughter Regina Harris. Regina Harris states that Regina Harris has a power of attorney and living will. Consents to her getting the flu shot while here. Regina Harris shares she has always been patients advocate helping make her needs known. She states patient is good at articulating what her needs are. All questions currently answered and plan of care updated. Patients grandaughter states patient gets confused and tries to get up abruptly. Also mentions new rash under left breast. This nurse applying powder to rash

## 2019-07-29 NOTE — ED Notes (Signed)
Called [facility] spoke to [place] about transfer [phone]

## 2019-07-29 NOTE — ED Notes (Signed)
Dr. Archie Balboa notified of pt positive COVID

## 2019-07-29 NOTE — ED Provider Notes (Signed)
3:50 PM Assumed care for off going team.   Blood pressure 110/64, pulse 68, temperature 98.2 F (36.8 C), temperature source Oral, resp. rate 16, height 5\' 7"  (1.702 m), weight 86.2 kg, SpO2 94 %.  See their HPI for full report but in brief [facility]-weakness for 4 days, UTI, 2 days bactrim not working.  Now on ceftriaxone. Covid + Plan to transfer [facility]  [facility] accepted transfer Dr. Venetia Constable.          Vanessa [place], MD 07/29/19 [phone]

## 2019-07-29 NOTE — H&P (Signed)
History and Physical    Regina Harris [id number] DOB: 04/15/1936 DOA: 07/29/2019  PCP: Abner Greenspan, MD  Patient coming from: home  Chief Complaint:  Weak not eating  HPI: Regina Harris is a 83 y.o. female with medical history significant of htn, gerd, dm comes in with 6 days of uri and progressive worsening weakness.  Not eating very well and getting more weak.  Coughing but no sob.  Was started on bactrim for uti and not better.  No n/v/d.  Pt referred for admission for covid infection.   Review of Systems: As per HPI otherwise 10 point review of systems negative.   Past Medical History:  Diagnosis Date   Barrett esophagus    Carotid bruit    Diabetes mellitus without complication ([place])    Esophagitis    Gastritis 2013   GERD (gastroesophageal reflux disease)    HH (hiatus hernia)    History of colonic polyps    History of repair of right rotator cuff    Hyperlipidemia    Hypertension    Hyperthyroidism 06/09/2018   Per [facility] Therapy for Hyperthyroidism order   Lump or mass in breast    Pneumonia    Polycythemia, secondary    Tobacco abuse     Past Surgical History:  Procedure Laterality Date   ABDOMINAL HYSTERECTOMY     APPENDECTOMY     BREAST BIOPSY Right 1996   BREAST BIOPSY Left 10/09/2012   Benign breast tissue with focal fat necrosis and focal periductal chronic inflammation.   BREAST BIOPSY Left 10/09/2012   Benign breast tissue with focal fat necrosis and focal periductal chronic inflammation.   CATARACT EXTRACTION  3/11   Dr Charise Killian   CHOLECYSTECTOMY     COLONOSCOPY  2009   ESOPHAGOGASTRODUODENOSCOPY (EGD) WITH PROPOFOL N/A 11/02/2015   Procedure: ESOPHAGOGASTRODUODENOSCOPY (EGD) WITH PROPOFOL;  Surgeon: Manya Silvas, MD;  Location: [facility] ENDOSCOPY;  Service: Endoscopy;  Laterality: N/A;   SHOULDER ARTHROSCOPY WITH OPEN ROTATOR CUFF REPAIR Right 03/05/2016   Procedure: SHOULDER ARTHROSCOPY WITH OPEN ROTATOR CUFF REPAIR;   Surgeon: Earnestine Leys, MD;  Location: [facility] ORS;  Service: Orthopedics;  Laterality: Right;   UPPER GI ENDOSCOPY  2013     reports that she has been smoking cigarettes. She has been smoking about 1.00 pack per day. She has never used smokeless tobacco. She reports that she does not drink alcohol or use drugs.  No Known Allergies  Family History  Problem Relation Age of Onset   Kidney cancer Father    Diabetes Mother    Breast cancer Neg Hx     Prior to Admission medications   Medication Sig Start Date End Date Taking? Authorizing Provider  aspirin EC 81 MG tablet Take 81 mg by mouth daily.    [provider]  atorvastatin (LIPITOR) 10 MG tablet TAKE 1 TABLET BY MOUTH ONCE A DAY 03/03/19   Tower, Wynelle Fanny, MD  Calcium Carb-Cholecalciferol (CALCIUM 1000 + D PO) Take 1 capsule by mouth daily.    [provider]  cyanocobalamin (,VITAMIN B-12,) 1000 MCG/ML injection Inject 1 mL (1,000 mcg total) into the muscle once a week. For four weeks 01/05/19   Tower, Wynelle Fanny, MD  cyanocobalamin 1000 MCG tablet Take 1,000 mcg by mouth daily.    [provider]  donepezil (ARICEPT) 5 MG tablet Take 1 tablet (5 mg total) by mouth at bedtime. 12/30/18   Tower, Wynelle Fanny, MD  furosemide (LASIX) 20 MG tablet  TAKE 1 TABLET BY MOUTH ONCE A DAY 07/22/19   Tower, Wynelle Fanny, MD  gabapentin (NEURONTIN) 400 MG capsule TAKE 1 CAPSULE BY MOUTH TWICE DAILY 03/03/19   Tower, Wynelle Fanny, MD  hydrochlorothiazide (HYDRODIURIL) 25 MG tablet Take 1 tablet (25 mg total) by mouth daily. 02/28/18   Tower, Wynelle Fanny, MD  levothyroxine (SYNTHROID, LEVOTHROID) 50 MCG tablet Take 50 mcg by mouth daily before breakfast.  12/11/18 12/11/19  [provider]  loratadine (CLARITIN) 10 MG tablet Take 10 mg by mouth daily.    [provider]  methocarbamol (ROBAXIN) 500 MG tablet TAKE 1 TABLET BY MOUTH 3 TIMES DAILY AS NEEDED FOR MUSCLE SPASMS (FORMODERATE TO SEVERE BACK PAIN) Patient not taking: Reported on  07/27/2019 12/17/18   Tower, Wynelle Fanny, MD  omeprazole (PRILOSEC) 20 MG capsule TAKE 1 CAPSULE BY MOUTH TWICE DAILY 07/22/19   Tower, Wynelle Fanny, MD  potassium chloride SA (K-DUR) 20 MEQ tablet TAKE 1 TABLET BY MOUTH TWICE A DAY 03/03/19   Tower, Marne A, MD  sulfamethoxazole-trimethoprim (BACTRIM DS) 800-160 MG tablet Take 1 tablet by mouth 2 (two) times daily. 07/27/19   Tower, Wynelle Fanny, MD  Syringe/Needle, Disp, 25G X 1" 1 ML MISC Use go give B12 injection once a week for 4 weeks 01/05/19   Tower, Roque Lias A, MD  potassium chloride (K-DUR) 10 MEQ tablet Take 1 tablet (10 mEq total) by mouth daily. 01/04/14 05/02/14  Tower, Wynelle Fanny, MD    Physical Exam: There were no vitals filed for this visit.    Constitutional: NAD, calm, comfortable There were no vitals filed for this visit. Eyes: PERRL, lids and conjunctivae normal ENMT: Mucous membranes are moist. Posterior pharynx clear of any exudate or lesions.Normal dentition.  Neck: normal, supple, no masses, no thyromegaly Respiratory: clear to auscultation bilaterally, no wheezing, no crackles. Normal respiratory effort. No accessory muscle use.  Cardiovascular: Regular rate and rhythm, no murmurs / rubs / gallops. No extremity edema. 2+ pedal pulses. No carotid bruits.  Abdomen: no tenderness, no masses palpated. No hepatosplenomegaly. Bowel sounds positive.  Musculoskeletal: no clubbing / cyanosis. No joint deformity upper and lower extremities. Good ROM, no contractures. Normal muscle tone.  Skin: no rashes, lesions, ulcers. No induration Neurologic: CN 2-12 grossly intact. Sensation intact, DTR normal. Strength 5/5 in all 4.  Psychiatric: Normal judgment and insight. Alert and oriented x 3. Normal mood.    Labs on Admission: I have personally reviewed following labs and imaging studies  CBC: Recent Labs  Lab 07/29/19 1123  WBC 2.7*  HGB 16.8*  HCT 50.8*  MCV 91.5  PLT 92*   Basic Metabolic Panel: Recent Labs  Lab 07/29/19 1123  NA 137  K  4.4  CL 100  CO2 25  GLUCOSE 107*  BUN 19  CREATININE 1.05*  CALCIUM 8.8*   GFR: Estimated Creatinine Clearance: 47.4 mL/min (A) (by C-G formula based on SCr of 1.05 mg/dL (H)). Liver Function Tests: No results for input(s): AST, ALT, ALKPHOS, BILITOT, PROT, ALBUMIN in the last 168 hours. No results for input(s): LIPASE, AMYLASE in the last 168 hours. No results for input(s): AMMONIA in the last 168 hours. Coagulation Profile: No results for input(s): INR, PROTIME in the last 168 hours. Cardiac Enzymes: No results for input(s): CKTOTAL, CKMB, CKMBINDEX, TROPONINI in the last 168 hours. BNP (last 3 results) No results for input(s): PROBNP in the last 8760 hours. HbA1C: No results for input(s): HGBA1C in the last 72 hours. CBG: No results for  input(s): GLUCAP in the last 168 hours. Lipid Profile: No results for input(s): CHOL, HDL, LDLCALC, TRIG, CHOLHDL, LDLDIRECT in the last 72 hours. Thyroid Function Tests: Recent Labs    07/29/19 1133  TSH 2.301   Anemia Panel: No results for input(s): VITAMINB12, FOLATE, FERRITIN, TIBC, IRON, RETICCTPCT in the last 72 hours. Urine analysis:    Component Value Date/Time   COLORURINE AMBER (A) 07/29/2019 1344   APPEARANCEUR HAZY (A) 07/29/2019 1344   LABSPEC 1.016 07/29/2019 1344   PHURINE 6.0 07/29/2019 1344   GLUCOSEU NEGATIVE 07/29/2019 1344   HGBUR SMALL (A) 07/29/2019 1344   BILIRUBINUR NEGATIVE 07/29/2019 1344   BILIRUBINUR negative 07/27/2019 1036   KETONESUR 5 (A) 07/29/2019 1344   PROTEINUR 30 (A) 07/29/2019 1344   UROBILINOGEN 0.2 07/27/2019 1036   NITRITE NEGATIVE 07/29/2019 1344   LEUKOCYTESUR MODERATE (A) 07/29/2019 1344   Sepsis Labs: !!!!!!!!!!!!!!!!!!!!!!!!!!!!!!!!!!!!!!!!!!!! @LABRCNTIP (procalcitonin:4,lacticidven:4) ) Recent Results (from the past 240 hour(s))  Urine Culture     Status: Abnormal   Collection Time: 07/27/19 12:38 PM   Specimen: Urine  Result Value Ref Range Status   MICRO NUMBER: [id number]   Final   SPECIMEN QUALITY: Adequate  Final   Sample Source NOT GIVEN  Final   STATUS: FINAL  Final   ISOLATE 1: Escherichia coli (A)  Final    Comment: Greater than 100,000 CFU/mL of Escherichia coli      Susceptibility   Escherichia coli - URINE CULTURE, REFLEX    AMOX/CLAVULANIC <=2 Sensitive     AMPICILLIN <=2 Sensitive     AMPICILLIN/SULBACTAM <=2 Sensitive     CEFAZOLIN* <=4 Not Reportable      * For infections other than uncomplicated UTIcaused by E. coli, K. pneumoniae or P. mirabilis:Cefazolin is resistant if MIC > or = 8 mcg/mL.(Distinguishing susceptible versus intermediatefor isolates with MIC < or = 4 mcg/mL requiresadditional testing.)For uncomplicated UTI caused by E. coli,K. pneumoniae or P. mirabilis: Cefazolin issusceptible if MIC <32 mcg/mL and predictssusceptible to the oral agents cefaclor, cefdinir,cefpodoxime, cefprozil, cefuroxime, cephalexinand loracarbef.    CEFEPIME <=1 Sensitive     CEFTRIAXONE <=1 Sensitive     CIPROFLOXACIN <=0.25 Sensitive     LEVOFLOXACIN <=0.12 Sensitive     ERTAPENEM <=0.5 Sensitive     GENTAMICIN <=1 Sensitive     IMIPENEM <=0.25 Sensitive     NITROFURANTOIN <=16 Sensitive     PIP/TAZO <=4 Sensitive     TOBRAMYCIN <=1 Sensitive     TRIMETH/SULFA* <=20 Sensitive      * For infections other than uncomplicated UTIcaused by E. coli, K. pneumoniae or P. mirabilis:Cefazolin is resistant if MIC > or = 8 mcg/mL.(Distinguishing susceptible versus intermediatefor isolates with MIC < or = 4 mcg/mL requiresadditional testing.)For uncomplicated UTI caused by E. coli,K. pneumoniae or P. mirabilis: Cefazolin issusceptible if MIC <32 mcg/mL and predictssusceptible to the oral agents cefaclor, cefdinir,cefpodoxime, cefprozil, cefuroxime, cephalexinand loracarbef.Legend:S = Susceptible  I = IntermediateR = Resistant  NS = Not susceptible* = Not tested  NR = Not reported**NN = See antimicrobic comments  SARS Coronavirus 2 [facility] order, Performed in [facility] hospital lab) Nasopharyngeal Nasopharyngeal Swab     Status: Abnormal   Collection Time: 07/29/19  1:44 PM   Specimen: Nasopharyngeal Swab  Result Value Ref Range Status   SARS Coronavirus 2 POSITIVE (A) NEGATIVE Final    Comment: RESULT CALLED TO, READ BACK BY AND VERIFIED WITH: BRIANNA CHAPMAN 07/29/19 1453 KLW (NOTE) If result is NEGATIVE SARS-CoV-2 target nucleic acids are NOT DETECTED.  The SARS-CoV-2 RNA is generally detectable in upper and lower  respiratory specimens during the acute phase of infection. The lowest  concentration of SARS-CoV-2 viral copies this assay can detect is 250  copies / mL. A negative result does not preclude SARS-CoV-2 infection  and should not be used as the sole basis for treatment or other  patient management decisions.  A negative result may occur with  improper specimen collection / handling, submission of specimen other  than nasopharyngeal swab, presence of viral mutation(s) within the  areas targeted by this assay, and inadequate number of viral copies  (<250 copies / mL). A negative result must be combined with clinical  observations, patient history, and epidemiological information. If result is POSITIVE SARS-CoV-2 target nucleic acids are DETECTED. The  SARS-CoV-2 RNA is generally detectable in upper and lower  respiratory specimens during the acute phase of infection.  Positive  results are indicative of active infection with SARS-CoV-2.  Clinical  correlation with patient history and other diagnostic information is  necessary to determine patient infection status.  Positive results do  not rule out bacterial infection or co-infection with other viruses. If result is PRESUMPTIVE POSTIVE SARS-CoV-2 nucleic acids MAY BE PRESENT.   A presumptive positive result was obtained on the submitted specimen  and confirmed on repeat testing.  While 2019 novel coronavirus  (SARS-CoV-2) nucleic acids may be present in the submitted sample    additional confirmatory testing may be necessary for epidemiological  and / or clinical management purposes  to differentiate between  SARS-CoV-2 and other Sarbecovirus currently known to infect humans.  If clinically indicated additional testing with an alternate test  methodology [phone]) is a dvised. The SARS-CoV-2 RNA is generally  detectable in upper and lower respiratory specimens during the acute  phase of infection. The expected result is Negative. Fact Sheet for Patients:  StrictlyIdeas.no Fact Sheet for Healthcare Providers: BankingDealers.co.za This test is not yet approved or cleared by the [place] FDA and has been authorized for detection and/or diagnosis of SARS-CoV-2 by FDA under an Emergency Use Authorization (EUA).  This EUA will remain in effect (meaning this test can be used) for the duration of the COVID-19 declaration under Section 564(b)(1) of the Act, 21 U.S.C. section 360bbb-3(b)(1), unless the authorization is terminated or revoked sooner. Performed at [facility], [address] [place], [place] [address]      Radiological Exams on Admission: Ct Head Wo Contrast  Result Date: 07/29/2019 CLINICAL DATA:  Progressive confusion, generalized weakness, incontinence for 3 days. EXAM: CT HEAD WITHOUT CONTRAST TECHNIQUE: Contiguous axial images were obtained from the base of the skull through the vertex without intravenous contrast. COMPARISON:  Report of head CT dated 07/21/2016 FINDINGS: Brain: No evidence of acute infarction, hemorrhage, hydrocephalus, extra-axial collection or mass lesion/mass effect. Minimal age-related atrophy with slight asymmetry of the lateral ventricles, not felt to be significant. Vascular: No hyperdense vessel or unexpected calcification. Skull: Normal. Negative for fracture or focal lesion. Sinuses/Orbits: Normal. Other: None IMPRESSION: No acute or significant abnormalities.  Electronically Signed   By: Lorriane Shire M.D.   On: 07/29/2019 13:44   Dg Chest Portable 1 View  Result Date: 07/29/2019 CLINICAL DATA:  Cough. EXAM: PORTABLE CHEST 1 VIEW COMPARISON:  Radiographs of October 14, 2018. FINDINGS: The heart size and mediastinal contours are within normal limits. No pneumothorax or pleural effusion is noted. Mild bibasilar subsegmental atelectasis is noted. The visualized skeletal structures are unremarkable. IMPRESSION: Mild bibasilar subsegmental atelectasis is  noted which is increased compared to prior exam. Electronically Signed   By: Marijo Conception M.D.   On: 07/29/2019 12:13   Old chart reviewed cxr reviewed with atelectasis  Assessment/Plan 83 yo female with covid infection and uti  Principal Problem:   Encephalopathy due to COVID-19 virus- mental status clear at this time No focal neuro def.  Active Problems:   Acute lower UTI- recent cx with e coli pansens.  Rocephin iv    Pneumonia due to COVID-19 virus- place on decadron, vit c, zinc, asa and lovenox.  o2 sats over 90%    Essential hypertension- stable clarify and resume home meds    COVID-19 virus infection- as above      DVT prophylaxis: lovenox Code Status: full Family Communication: none  Disposition Plan:  days Consults called:  none Admission status:  admission   Jame Seelig A MD [facility]  If 7PM-7AM, please contact night-coverage [email or web address] Password [facility]  07/29/2019, 8:31 PM

## 2019-07-29 NOTE — ED Notes (Signed)
Pt ambulated to the toilet, NAD noted. Pt given cup of ice water. Family at bedside.

## 2019-07-29 NOTE — ED Triage Notes (Signed)
Pt in via POV, per family, states worsening generalized weakness, confusion, urinary incontinence x 3 days.  Pt currently on oral antibiotics for UTI, which were started on Monday without any signs of getting better.

## 2019-07-30 ENCOUNTER — Other Ambulatory Visit: Payer: Self-pay | Admitting: Family Medicine

## 2019-07-30 ENCOUNTER — Encounter (HOSPITAL_COMMUNITY): Payer: Self-pay

## 2019-07-30 LAB — CBC
HCT: 50.7 % — ABNORMAL HIGH (ref 36.0–46.0)
Hemoglobin: 16.6 g/dL — ABNORMAL HIGH (ref 12.0–15.0)
MCH: 30.5 pg (ref 26.0–34.0)
MCHC: 32.7 g/dL (ref 30.0–36.0)
MCV: 93.2 fL (ref 80.0–100.0)
Platelets: 93 10*3/uL — ABNORMAL LOW (ref 150–400)
RBC: 5.44 MIL/uL — ABNORMAL HIGH (ref 3.87–5.11)
RDW: 13.3 % (ref 11.5–15.5)
WBC: 2.9 10*3/uL — ABNORMAL LOW (ref 4.0–10.5)
nRBC: 0 % (ref 0.0–0.2)

## 2019-07-30 LAB — BASIC METABOLIC PANEL
Anion gap: 12 (ref 5–15)
BUN: 22 mg/dL (ref 8–23)
CO2: 24 mmol/L (ref 22–32)
Calcium: 8.7 mg/dL — ABNORMAL LOW (ref 8.9–10.3)
Chloride: 100 mmol/L (ref 98–111)
Creatinine, Ser: 0.92 mg/dL (ref 0.44–1.00)
GFR calc Af Amer: >60 mL/min (ref >60)
GFR calc non Af Amer: 58 mL/min — ABNORMAL LOW (ref >60)
Glucose, Bld: 89 mg/dL (ref 70–99)
Potassium: 4.5 mmol/L (ref 3.5–5.1)
Sodium: 136 mmol/L (ref 135–145)

## 2019-07-30 LAB — C-REACTIVE PROTEIN: CRP: 2.6 mg/dL — ABNORMAL HIGH (ref <1.0)

## 2019-07-30 LAB — D-DIMER, QUANTITATIVE: D-Dimer, Quant: 1.01 ug/mL-FEU — ABNORMAL HIGH (ref 0.00–0.50)

## 2019-07-30 LAB — TROPONIN I (HIGH SENSITIVITY): Troponin I (High Sensitivity): 12 ng/L (ref <18)

## 2019-07-30 MED ORDER — SODIUM CHLORIDE 0.9 % IV SOLN
INTRAVENOUS | Status: AC
  Administered 2019-07-30: 17:00:00 via INTRAVENOUS

## 2019-07-30 MED ORDER — ADULT MULTIVITAMIN W/MINERALS CH
1.0000 | ORAL_TABLET | Freq: Every day | ORAL | Status: DC
  Administered 2019-07-30 – 2019-08-06 (×8): 1 via ORAL
  Filled 2019-07-30 (×8): qty 1

## 2019-07-30 MED ORDER — ENSURE ENLIVE PO LIQD
237.0000 mL | Freq: Three times a day (TID) | ORAL | Status: DC
  Administered 2019-07-30 – 2019-08-06 (×18): 237 mL via ORAL

## 2019-07-30 MED ORDER — INFLUENZA VAC A&B SA ADJ QUAD 0.5 ML IM PRSY
0.5000 mL | PREFILLED_SYRINGE | INTRAMUSCULAR | Status: DC
  Filled 2019-07-30: qty 0.5

## 2019-07-30 NOTE — TOC Initial Note (Signed)
Transition of Care [facility]) - Initial/Assessment Note    Patient Details  Name: Regina Harris MRN: [id number] Date of Birth: 1935-12-05  [facility] CM/SW Contact:    Ninfa Meeker, RN Phone Number: [phone] (working remotely) 07/30/2019, 4:21 PM  Clinical Narrative:  83 yr old female admitted from home for treatment of COVID 19. Case manager will continue to follow for appropriate discharge disposition as patient medically improves. May she be blessed to do so.                      Patient Goals and CMS Choice        Expected Discharge Plan and Services                                                Prior Living Arrangements/Services                       Activities of Daily Living Home Assistive Devices/Equipment: None ADL Screening (condition at time of admission) Patient's cognitive ability adequate to safely complete daily activities?: No Is the patient deaf or have difficulty hearing?: No Does the patient have difficulty seeing, even when wearing glasses/contacts?: No Does the patient have difficulty concentrating, remembering, or making decisions?: Yes Patient able to express need for assistance with ADLs?: Yes Does the patient have difficulty dressing or bathing?: Yes Independently performs ADLs?: No Communication: Needs assistance Is this a change from baseline?: Pre-admission baseline Dressing (OT): Needs assistance Is this a change from baseline?: Pre-admission baseline Grooming: Needs assistance Is this a change from baseline?: Pre-admission baseline Feeding: Needs assistance Is this a change from baseline?: Pre-admission baseline Bathing: Needs assistance Is this a change from baseline?: Pre-admission baseline Toileting: Needs assistance Is this a change from baseline?: Pre-admission baseline In/Out Bed: Needs assistance Is this a change from baseline?: Pre-admission baseline Walks in Home: Needs assistance Is  this a change from baseline?: Change from baseline, expected to last <3 days Does the patient have difficulty walking or climbing stairs?: Yes Weakness of Legs: None Weakness of Arms/Hands: None  Permission Sought/Granted                  Emotional Assessment              Admission diagnosis:  covid 19 Patient Active Problem List   Diagnosis Date Noted  . Acute metabolic encephalopathy 91/50/5697  . Acute lower UTI 07/29/2019  . Pneumonia due to COVID-19 virus 07/29/2019  . COVID-19 virus infection 07/29/2019  . UTI (urinary tract infection) 07/27/2019  . Nasal congestion 07/27/2019  . B12 deficiency 01/05/2019  . Memory loss 12/30/2018  . Poor balance 12/30/2018  . Low back pain 10/14/2018  . Fatigue 10/01/2018  . Abnormal urinalysis 10/01/2018  . 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 Right leg swelling 06/09/2018  . Right knee pain 06/09/2018  . Baker's cyst of knee, right 06/09/2018  . Encounter for screening mammogram for breast cancer 02/27/2017  . Estrogen deficiency 02/27/2017  . Foot pain, bilateral 02/27/2017  . Hypothyroidism 04/16/2016  . Pedal edema 04/05/2016  . Right rotator cuff tear 03/09/2016  . Hypokalemia 03/01/2016  . Nodule of chest wall 10/06/2014  . Left breast mass 06/30/2014  . Encounter for Medicare annual wellness exam 02/22/2014  . Polycythemia, secondary 02/22/2014  .  GERD (gastroesophageal reflux disease) 08/24/2013  . Lump or mass in breast 05/06/2013  . Post-menopausal 06/24/2012  . Obesity 12/24/2011  . Prediabetes 03/12/2011  . Hyperglycemia 03/12/2011  . NEOPLASM UNSPECIFIED NATURE DIGESTIVE SYSTEM 07/24/2010  . LUNG NODULE 01/16/2010  . HYPERCHOLESTEROLEMIA, PURE 07/10/2007  . Smoker 03/19/2007  . Essential hypertension 03/19/2007  . Carotid bruit 03/19/2007  . COLONIC POLYPS, HX OF 03/19/2007   PCP:  Abner Greenspan, MD Pharmacy:   [facility] [address] Phone: [phone] Fax: [phone]     Social Determinants of Health (SDOH) Interventions    Readmission Risk Interventions No flowsheet data found.

## 2019-07-30 NOTE — Progress Notes (Signed)
Updated patients emergency care contact [place] on patient plan of care and all questions answered at this time. [name] suggested game shows on tv during the day for [place]. This nurse will pass information on in report.

## 2019-07-30 NOTE — Progress Notes (Signed)
[facility] PROGRESS NOTE    Progress Note  TKAI LARGE  XNA:[id number] DOB: Mar 20, 1936 DOA: 07/29/2019 PCP: Abner Greenspan, MD     Brief Narrative:   Regina Harris is an 83 y.o. female past medical history significant for hypertension, diabetes mellitus type 2 comes in for 6-day of a UTI and progressive weakness.  Not eating or drinking well.  There is some cough but no shortness of breath she was started on Bactrim with no improvement.  Assessment/Plan:   Acute metabolic encephalopathy possibly due to UTI: She is clearly not mentating at baseline but is nonfocal physical exam. There is likely due to UTI also the differential would include: 19 encephalopathy but is unlikely. Agree with IV Rocephin her previous urine cultures show E. coli pansensitive. She was started on IV Decadron vitamin C zinc and aspirin.  Continue Lovenox for DVT prophylaxis. Has remained greater 94% on room air.  We will continue to follow these closely. Inflammatory markers are mildly elevated.  Essential hypertension: Blood pressure is stable. Continue current home regimen.  Pneumonia due to COVID-19 virus: Continue vitamin C and zinc, she is not hypoxic she is satting greater 94% on room air. We will continue to follow closely.  DVT prophylaxis: lovenox Family Communication:none Disposition Plan/Barrier to D/C: once she complete Code Status:     Code Status Orders  (From admission, onward)         Start     Ordered   07/29/19 2028  Full code  Continuous     07/29/19 2028        Code Status History    This patient has a current code status but no historical code status.   Advance Care Planning Activity    Advance Directive Documentation     Most Recent Value  Type of Advance Directive  Living will  Pre-existing out of facility DNR order (yellow form or pink MOST form)  -  "MOST" Form in Place?  -        IV Access:    Peripheral IV   Procedures and diagnostic studies:    Ct Head Wo Contrast  Result Date: 07/29/2019 CLINICAL DATA:  Progressive confusion, generalized weakness, incontinence for 3 days. EXAM: CT HEAD WITHOUT CONTRAST TECHNIQUE: Contiguous axial images were obtained from the base of the skull through the vertex without intravenous contrast. COMPARISON:  Report of head CT dated 07/21/2016 FINDINGS: Brain: No evidence of acute infarction, hemorrhage, hydrocephalus, extra-axial collection or mass lesion/mass effect. Minimal age-related atrophy with slight asymmetry of the lateral ventricles, not felt to be significant. Vascular: No hyperdense vessel or unexpected calcification. Skull: Normal. Negative for fracture or focal lesion. Sinuses/Orbits: Normal. Other: None IMPRESSION: No acute or significant abnormalities. Electronically Signed   By: Lorriane Shire M.D.   On: 07/29/2019 13:44   Dg Chest Portable 1 View  Result Date: 07/29/2019 CLINICAL DATA:  Cough. EXAM: PORTABLE CHEST 1 VIEW COMPARISON:  Radiographs of October 14, 2018. FINDINGS: The heart size and mediastinal contours are within normal limits. No pneumothorax or pleural effusion is noted. Mild bibasilar subsegmental atelectasis is noted. The visualized skeletal structures are unremarkable. IMPRESSION: Mild bibasilar subsegmental atelectasis is noted which is increased compared to prior exam. Electronically Signed   By: Marijo Conception M.D.   On: 07/29/2019 12:13     Medical Consultants:    None.  Anti-Infectives:   Rocephin  Subjective:    [name] she has no new complaints this morning she  continues to feels tired.  Objective:    Vitals:   07/29/19 2155 07/30/19 0500  BP: 129/67 105/62  Pulse: 72   Resp: 18   Temp: 98.1 F (36.7 C) 99 F (37.2 C)  TempSrc: Oral Oral  SpO2: 99%   Weight: 79.7 kg 79.7 kg  Height: 5\' 7"  (1.702 m)    SpO2: 99 %   Intake/Output Summary (Last 24 hours) at 07/30/2019 0727 Last data filed at 07/30/2019 0026 Gross per 24 hour   Intake -  Output 1 ml  Net -1 ml   Filed Weights   07/29/19 2155 07/30/19 0500  Weight: 79.7 kg 79.7 kg    Exam: General exam: In no acute distress. Respiratory system: Good air movement and clear to auscultation. Cardiovascular system: S1 & S2 heard, RRR. No JVD. Gastrointestinal system: Abdomen is nondistended, soft and nontender.  Central nervous system: Alert and oriented. No focal neurological deficits. Extremities: No pedal edema. Skin: No rashes, lesions or ulcers Psychiatry: Judgement and insight appear normal. Mood & affect appropriate.   Data Reviewed:    Labs: Basic Metabolic Panel: Recent Labs  Lab 07/29/19 1123 07/29/19 2055 07/30/19 0135  NA 137  --  136  K 4.4  --  4.5  CL 100  --  100  CO2 25  --  24  GLUCOSE 107*  --  89  BUN 19  --  22  CREATININE 1.05* 1.05* 0.92  CALCIUM 8.8*  --  8.7*   GFR Estimated Creatinine Clearance: 52.1 mL/min (by C-G formula based on SCr of 0.92 mg/dL). Liver Function Tests: No results for input(s): AST, ALT, ALKPHOS, BILITOT, PROT, ALBUMIN in the last 168 hours. No results for input(s): LIPASE, AMYLASE in the last 168 hours. No results for input(s): AMMONIA in the last 168 hours. Coagulation profile No results for input(s): INR, PROTIME in the last 168 hours. COVID-19 Labs  Recent Labs    07/29/19 2055 07/30/19 0135  DDIMER 1.18* 1.01*  CRP 2.0* 2.6*    Lab Results  Component Value Date   SARSCOV2NAA POSITIVE (A) 07/29/2019    CBC: Recent Labs  Lab 07/29/19 1123 07/29/19 2055 07/30/19 0135  WBC 2.7* 2.6* 2.9*  HGB 16.8* 16.0* 16.6*  HCT 50.8* 48.7* 50.7*  MCV 91.5 92.6 93.2  PLT 92* 87* 93*   Cardiac Enzymes: No results for input(s): CKTOTAL, CKMB, CKMBINDEX, TROPONINI in the last 168 hours. BNP (last 3 results) No results for input(s): PROBNP in the last 8760 hours. CBG: No results for input(s): GLUCAP in the last 168 hours. D-Dimer: Recent Labs    07/29/19 2055 07/30/19 0135  DDIMER  1.18* 1.01*   Hgb A1c: No results for input(s): HGBA1C in the last 72 hours. Lipid Profile: No results for input(s): CHOL, HDL, LDLCALC, TRIG, CHOLHDL, LDLDIRECT in the last 72 hours. Thyroid function studies: Recent Labs    07/29/19 1133  TSH 2.301   Anemia work up: No results for input(s): VITAMINB12, FOLATE, FERRITIN, TIBC, IRON, RETICCTPCT in the last 72 hours. Sepsis Labs: Recent Labs  Lab 07/29/19 1123 07/29/19 2055 07/30/19 0135  PROCALCITON  --  <0.10  --   WBC 2.7* 2.6* 2.9*   Microbiology Recent Results (from the past 240 hour(s))  Urine Culture     Status: Abnormal   Collection Time: 07/27/19 12:38 PM   Specimen: Urine  Result Value Ref Range Status   MICRO NUMBER: [id number]  Final   SPECIMEN QUALITY: Adequate  Final   Sample Source NOT GIVEN  Final   STATUS: FINAL  Final   ISOLATE 1: Escherichia coli (A)  Final    Comment: Greater than 100,000 CFU/mL of Escherichia coli      Susceptibility   Escherichia coli - URINE CULTURE, REFLEX    AMOX/CLAVULANIC <=2 Sensitive     AMPICILLIN <=2 Sensitive     AMPICILLIN/SULBACTAM <=2 Sensitive     CEFAZOLIN* <=4 Not Reportable      * For infections other than uncomplicated UTIcaused by E. coli, K. pneumoniae or P. mirabilis:Cefazolin is resistant if MIC > or = 8 mcg/mL.(Distinguishing susceptible versus intermediatefor isolates with MIC < or = 4 mcg/mL requiresadditional testing.)For uncomplicated UTI caused by E. coli,K. pneumoniae or P. mirabilis: Cefazolin issusceptible if MIC <32 mcg/mL and predictssusceptible to the oral agents cefaclor, cefdinir,cefpodoxime, cefprozil, cefuroxime, cephalexinand loracarbef.    CEFEPIME <=1 Sensitive     CEFTRIAXONE <=1 Sensitive     CIPROFLOXACIN <=0.25 Sensitive     LEVOFLOXACIN <=0.12 Sensitive     ERTAPENEM <=0.5 Sensitive     GENTAMICIN <=1 Sensitive     IMIPENEM <=0.25 Sensitive     NITROFURANTOIN <=16 Sensitive     PIP/TAZO <=4 Sensitive     TOBRAMYCIN <=1 Sensitive      TRIMETH/SULFA* <=20 Sensitive      * For infections other than uncomplicated UTIcaused by E. coli, K. pneumoniae or P. mirabilis:Cefazolin is resistant if MIC > or = 8 mcg/mL.(Distinguishing susceptible versus intermediatefor isolates with MIC < or = 4 mcg/mL requiresadditional testing.)For uncomplicated UTI caused by E. coli,K. pneumoniae or P. mirabilis: Cefazolin issusceptible if MIC <32 mcg/mL and predictssusceptible to the oral agents cefaclor, cefdinir,cefpodoxime, cefprozil, cefuroxime, cephalexinand loracarbef.Legend:S = Susceptible  I = IntermediateR = Resistant  NS = Not susceptible* = Not tested  NR = Not reported**NN = See antimicrobic comments  SARS Coronavirus 2 [facility] order, Performed in [facility] hospital lab) Nasopharyngeal Nasopharyngeal Swab     Status: Abnormal   Collection Time: 07/29/19  1:44 PM   Specimen: Nasopharyngeal Swab  Result Value Ref Range Status   SARS Coronavirus 2 POSITIVE (A) NEGATIVE Final    Comment: RESULT CALLED TO, READ BACK BY AND VERIFIED WITH: BRIANNA CHAPMAN 07/29/19 1453 KLW (NOTE) If result is NEGATIVE SARS-CoV-2 target nucleic acids are NOT DETECTED. The SARS-CoV-2 RNA is generally detectable in upper and lower  respiratory specimens during the acute phase of infection. The lowest  concentration of SARS-CoV-2 viral copies this assay can detect is 250  copies / mL. A negative result does not preclude SARS-CoV-2 infection  and should not be used as the sole basis for treatment or other  patient management decisions.  A negative result may occur with  improper specimen collection / handling, submission of specimen other  than nasopharyngeal swab, presence of viral mutation(s) within the  areas targeted by this assay, and inadequate number of viral copies  (<250 copies / mL). A negative result must be combined with clinical  observations, patient history, and epidemiological information. If result is POSITIVE SARS-CoV-2 target nucleic acids  are DETECTED. The  SARS-CoV-2 RNA is generally detectable in upper and lower  respiratory specimens during the acute phase of infection.  Positive  results are indicative of active infection with SARS-CoV-2.  Clinical  correlation with patient history and other diagnostic information is  necessary to determine patient infection status.  Positive results do  not rule out bacterial infection or co-infection with other viruses. If result is PRESUMPTIVE POSTIVE SARS-CoV-2 nucleic acids MAY BE PRESENT.  A presumptive positive result was obtained on the submitted specimen  and confirmed on repeat testing.  While 2019 novel coronavirus  (SARS-CoV-2) nucleic acids may be present in the submitted sample  additional confirmatory testing may be necessary for epidemiological  and / or clinical management purposes  to differentiate between  SARS-CoV-2 and other Sarbecovirus currently known to infect humans.  If clinically indicated additional testing with an alternate test  methodology [phone]) is a dvised. The SARS-CoV-2 RNA is generally  detectable in upper and lower respiratory specimens during the acute  phase of infection. The expected result is Negative. Fact Sheet for Patients:  StrictlyIdeas.no Fact Sheet for Healthcare Providers: BankingDealers.co.za This test is not yet approved or cleared by the [place] FDA and has been authorized for detection and/or diagnosis of SARS-CoV-2 by FDA under an Emergency Use Authorization (EUA).  This EUA will remain in effect (meaning this test can be used) for the duration of the COVID-19 declaration under Section 564(b)(1) of the Act, 21 U.S.C. section 360bbb-3(b)(1), unless the authorization is terminated or revoked sooner. Performed at [facility], [address] [place], [place] [address]      Medications:   . aspirin EC  81 mg Oral Daily  . dexamethasone (DECADRON) injection   6 mg Intravenous q1800  . enoxaparin (LOVENOX) injection  40 mg Subcutaneous Q24H  . [START ON 07/31/2019] influenza vaccine adjuvanted  0.5 mL Intramuscular Tomorrow-1000  . vitamin C  500 mg Oral BID  . zinc sulfate  220 mg Oral Daily   Continuous Infusions: . sodium chloride    . cefTRIAXone (ROCEPHIN)  IV        LOS: 1 day   Charlynne Cousins  [facility]  07/30/2019, 7:27 AM

## 2019-07-30 NOTE — Progress Notes (Addendum)
Initial Nutrition Assessment RD working remotely.  DOCUMENTATION CODES:   Not applicable  INTERVENTION:    Ensure Enlive po TID, each supplement provides 350 kcal and 20 grams of protein   Pt receiving Hormel Shake daily with Breakfast which provides 520 kcals and 22 g of protein and Magic cup BID with lunch and dinner, each supplement provides 290 kcal and 9 grams of protein, automatically on meal trays to optimize nutritional intake.    MVI daily  NUTRITION DIAGNOSIS:   Increased nutrient needs related to acute illness(COVID) as evidenced by estimated needs.  GOAL:   Patient will meet greater than or equal to 90% of their needs  MONITOR:   PO intake, Supplement acceptance  REASON FOR ASSESSMENT:   Malnutrition Screening Tool    ASSESSMENT:   83 yo female admitted with UTI, progressive weakness, COVID-19 PNA. PMH includes HTN, DM-2, HLD, GERD, esophagitis, HH, Barrett's esophagus.   Unable to speak with patient, no answer when room number called. Per chart review, patient was not eating or drinking well PTA. Patient with 10% weight loss within the past 7 months per review of weight encounters. Patient is at increased nutrition risk; unable to obtain enough information at this time for identification of malnutrition.   Labs reviewed.  Medications reviewed and include decadron, vitamin C, zinc sulfate.   NUTRITION - FOCUSED PHYSICAL EXAM:  deferred  Diet Order:   Diet Order            Diet Heart Room service appropriate? Yes; Fluid consistency: Thin  Diet effective now              EDUCATION NEEDS:   Not appropriate for education at this time  Skin:  Skin Assessment: Reviewed RN Assessment  Last BM:  9/24 type 7  Height:   Ht Readings from Last 1 Encounters:  07/29/19 5\' 7"  (1.702 m)    Weight:   Wt Readings from Last 1 Encounters:  07/30/19 79.7 kg    Ideal Body Weight:  61.4 kg  BMI:  Body mass index is 27.52 kg/m.  Estimated  Nutritional Needs:   Kcal:  1800-2000  Protein:  95-110 gm  Fluid:  >/= 1.8 L    Molli Barrows, RD, LDN, [place] Pager [phone] After Hours Pager [phone]

## 2019-07-30 NOTE — Progress Notes (Signed)
0700 received bedside report 0800 assessment completed 1000 gave meds 1200 assit with eating 1400 showered pt 1600 repositioned pt in bed 1800started a new IV

## 2019-07-31 DIAGNOSIS — J1289 Other viral pneumonia: Secondary | ICD-10-CM

## 2019-07-31 DIAGNOSIS — I10 Essential (primary) hypertension: Secondary | ICD-10-CM

## 2019-07-31 DIAGNOSIS — N390 Urinary tract infection, site not specified: Secondary | ICD-10-CM

## 2019-07-31 DIAGNOSIS — G9341 Metabolic encephalopathy: Secondary | ICD-10-CM

## 2019-07-31 LAB — D-DIMER, QUANTITATIVE: D-Dimer, Quant: 0.93 ug/mL-FEU — ABNORMAL HIGH (ref 0.00–0.50)

## 2019-07-31 LAB — C-REACTIVE PROTEIN: CRP: 2.2 mg/dL — ABNORMAL HIGH (ref <1.0)

## 2019-07-31 LAB — FERRITIN: Ferritin: 689 ng/mL — ABNORMAL HIGH (ref 11–307)

## 2019-07-31 LAB — LACTATE DEHYDROGENASE: LDH: 292 U/L — ABNORMAL HIGH (ref 98–192)

## 2019-07-31 MED ORDER — ORAL CARE MOUTH RINSE
15.0000 mL | Freq: Two times a day (BID) | OROMUCOSAL | Status: DC
  Administered 2019-07-31 – 2019-08-06 (×12): 15 mL via OROMUCOSAL

## 2019-07-31 MED ORDER — SODIUM CHLORIDE 0.9% FLUSH
3.0000 mL | Freq: Two times a day (BID) | INTRAVENOUS | Status: DC
  Administered 2019-07-31 – 2019-08-01 (×2): 3 mL via INTRAVENOUS

## 2019-07-31 NOTE — Progress Notes (Signed)
0700 bedside report received, board updated, pt seems sleepy today I will watch her to make sure she is ok 0800 assessment and vs completed and charted 0900 had pt to sit up so she could eat, she only took a couple of bites but I got her to drink and ensure then she went back to sleep 1000 gave morning med 1100 got pt up to the chair, pt is still sleepy hoping to get her more awake to eat lunch, face chatted with familt 1200 fed pt lunch, pt ate 10% but drank a glass of juice and an ensure 1300 ambulated pt in the room, daughter called again spoke with her 1500 pt resting  1700 got pt back in bed, pt resting peacefully

## 2019-07-31 NOTE — Telephone Encounter (Signed)
You can ask her family if they would rather we fill them now or wait to make sure no med changes are made when she is d/c from the hospital  Thanks

## 2019-07-31 NOTE — Progress Notes (Signed)
Rehab Admissions Coordinator Note:  Patient was screened by Michel Santee for appropriateness for an Inpatient Acute Rehab Consult.  Note PT recommending SNF.    Michel Santee, PT, DPT 07/31/2019, 2:44 PM  I can be reached at [phone].

## 2019-07-31 NOTE — Evaluation (Signed)
Physical Therapy Evaluation Patient Details Name: Regina Harris MRN: [id number] DOB: Jul 01, 1936 Today's Date: 07/31/2019   History of Present Illness  83 y/o female w/ hx of HTN. DM II, Barrett esophagus, gastritis, HH, GERD, R RC repair, admitted with UTI and progressive weakness  Clinical Impression   Pt admitted with above diagnosis. Pt currently with functional limitations due to the deficits listed below (see PT Problem List). Pt quite lethargic this am, most tasks given needing min-mod a with max and continued cues and encouragement to complete tasks. Pt was on room air throughout and sats remained in high 90s with exception of when pt had bout of coughing and sats decreased to 87% once coughing subsided was able to recover to high 90s very rapidly. Pt will benefit from skilled PT to increase her strength, balance and coordination, activity tolerance, independence and safety with mobility to allow discharge to the venue listed below.       Follow Up Recommendations SNF    Equipment Recommendations  Other (comment)(TBD)    Recommendations for Other Services OT consult;Speech consult     Precautions / Restrictions Precautions Precautions: Fall Restrictions Weight Bearing Restrictions: No      Mobility  Bed Mobility Overal bed mobility: Needs Assistance Bed Mobility: Sidelying to Sit;Sit to Sidelying;Rolling Rolling: Mod assist Sidelying to sit: Mod assist     Sit to sidelying: Mod assist General bed mobility comments: may be able to do more but needed mod a this am to complete all bed mob tasks  Transfers Overall transfer level: Needs assistance Equipment used: 1 person hand held assist Transfers: Sit to/from Stand Sit to Stand: Min assist         General transfer comment: able to stand with HHA and cues   Ambulation/Gait Ambulation/Gait assistance: Min assist Gait Distance (Feet): 5 Feet Assistive device: 1 person hand held assist Gait Pattern/deviations:  Shuffle(very little clearance noted in BLE)     General Gait Details: poor balance, poor clerance only able to take few very shuffled step also sits down prior to completing 5 steps  Stairs            Wheelchair Mobility    Modified Rankin (Stroke Patients Only)       Balance Overall balance assessment: Needs assistance Sitting-balance support: Feet supported;Single extremity supported Sitting balance-Leahy Scale: Fair Sitting balance - Comments: able to sit EOB with uni arm supported and manupillate object with other hand   Standing balance support: Bilateral upper extremity supported Standing balance-Leahy Scale: Poor Standing balance comment: does not stand for long needing min-mod a to maintain stance with BUE supported                             Pertinent Vitals/Pain Pain Assessment: Faces Pain Score: 2  Faces Pain Scale: Hurts a little bit Pain Location: (with movement) Pain Intervention(s): Limited activity within patient's tolerance;Monitored during session    Home Living Family/patient expects to be discharged to:: Unsure Living Arrangements: Alone               Additional Comments: unsure of living arrangements as pt was unable to give any details on hx and no family members were available at time    Prior Function           Comments: as per chart pt was living alone      Hand Dominance        Extremity/Trunk Assessment  Upper Extremity Assessment Upper Extremity Assessment: Defer to OT evaluation    Lower Extremity Assessment Lower Extremity Assessment: Generalized weakness       Communication   Communication: Other (comment)(had difficulty answering questions, did not talk much )  Cognition Arousal/Alertness: Lethargic Behavior During Therapy: Restless;Flat affect Overall Cognitive Status: No family/caregiver present to determine baseline cognitive functioning                                  General Comments: pt very lethargic at assessment not able to give any info on hx, tends to just repeat what therapist has asked, minimally able to follow cues with repetition and re-iteration of instructions      General Comments General comments (skin integrity, edema, etc.): Pt quite lethargic this am, most tasks given needing min-mod a with max and continued cues and encouragement to complete tasks. Pt was on room air thorughout and sats remained in high 90s with exception of when pt had bout of coughing and sats decreased to 87% one coughing subsided was able to recover to high 90s very rapidly.    Exercises     Assessment/Plan    PT Assessment Patient needs continued PT services  PT Problem List Decreased strength;Decreased activity tolerance;Decreased balance;Decreased mobility;Decreased safety awareness       PT Treatment Interventions Gait training;Functional mobility training;Therapeutic activities;Therapeutic exercise;Neuromuscular re-education;Balance training;Patient/family education    PT Goals (Current goals can be found in the Care Plan section)  Acute Rehab PT Goals Time For Goal Achievement: 08/14/19 Potential to Achieve Goals: Fair    Frequency Min 2X/week   Barriers to discharge Other (comment)(unknown if family is able to assist if d/c home)      Co-evaluation               AM-PAC PT "6 Clicks" Mobility  Outcome Measure Help needed turning from your back to your side while in a flat bed without using bedrails?: A Lot Help needed moving from lying on your back to sitting on the side of a flat bed without using bedrails?: A Lot Help needed moving to and from a bed to a chair (including a wheelchair)?: A Lot Help needed standing up from a chair using your arms (e.g., wheelchair or bedside chair)?: A Lot Help needed to walk in hospital room?: A Lot Help needed climbing 3-5 steps with a railing? : A Lot 6 Click Score: 12    End of Session   Activity  Tolerance: Patient limited by lethargy;Treatment limited secondary to medical complications (Comment) Patient left: in bed;with call bell/phone within reach;with bed alarm set   PT Visit Diagnosis: Other abnormalities of gait and mobility (R26.89);Muscle weakness (generalized) (M62.81)    Time: 1022-1050 PT Time Calculation (min) (ACUTE ONLY): 28 min   Charges:   PT Evaluation $PT Eval Moderate Complexity: 1 Mod PT Treatments $Therapeutic Exercise: 8-22 mins        Horald Chestnut, PT   Delford Field 07/31/2019, 2:31 PM

## 2019-07-31 NOTE — Telephone Encounter (Signed)
Pt still in hospital, ?? If okay to refill meds now or wait until pt is d/c

## 2019-07-31 NOTE — Progress Notes (Signed)
[facility] PROGRESS NOTE    Progress Note  [name]  JFH:[id number] DOB: May 23, 1936 DOA: 07/29/2019 PCP: Abner Greenspan, MD     Brief Narrative:   Regina Harris is an 83 y.o. female past medical history significant for hypertension, diabetes mellitus type 2 comes in for 6-day of a UTI and progressive weakness.  Not eating or drinking well.  There is some cough but no shortness of breath she was started on Bactrim with no improvement.  Assessment/Plan:   Acute metabolic encephalopathy possibly due to UTI: This possibly due to UTI, she was on Bactrim as an outpatient which he failed treatment. Her urine culture from that visit with PCP grew E. coli pansensitive. She was started empirically on IV Rocephin on admission. Her saturations have remained greater than 94% on room air. Orders have been mildly elevated but are now trending down. Discontinue IV steroids as she is not hypoxic.  Essential hypertension: Blood pressure is stable. Continue current home regimen.  Pneumonia due to COVID-19 virus: She is currently satting greater 92% on room air, she is not hypoxic with no difficulty breathing. She was started empirically initially on steroids we have [name] on 07/31/2019.  We will continue monitor respiratory status and inflammatory markers closely.  DVT prophylaxis: lovenox Family Communication:none Disposition Plan/Barrier to D/C: unable to determine Code Status:     Code Status Orders  (From admission, onward)         Start     Ordered   07/29/19 2028  Full code  Continuous     07/29/19 2028        Code Status History    This patient has a current code status but no historical code status.   Advance Care Planning Activity    Advance Directive Documentation     Most Recent Value  Type of Advance Directive  Living will  Pre-existing out of facility DNR order (yellow form or pink MOST form)  -  "MOST" Form in Place?  -        IV Access:     Peripheral IV   Procedures and diagnostic studies:   Ct Head Wo Contrast  Result Date: 07/29/2019 CLINICAL DATA:  Progressive confusion, generalized weakness, incontinence for 3 days. EXAM: CT HEAD WITHOUT CONTRAST TECHNIQUE: Contiguous axial images were obtained from the base of the skull through the vertex without intravenous contrast. COMPARISON:  Report of head CT dated 07/21/2016 FINDINGS: Brain: No evidence of acute infarction, hemorrhage, hydrocephalus, extra-axial collection or mass lesion/mass effect. Minimal age-related atrophy with slight asymmetry of the lateral ventricles, not felt to be significant. Vascular: No hyperdense vessel or unexpected calcification. Skull: Normal. Negative for fracture or focal lesion. Sinuses/Orbits: Normal. Other: None IMPRESSION: No acute or significant abnormalities. Electronically Signed   By: Lorriane Shire M.D.   On: 07/29/2019 13:44   Dg Chest Portable 1 View  Result Date: 07/29/2019 CLINICAL DATA:  Cough. EXAM: PORTABLE CHEST 1 VIEW COMPARISON:  Radiographs of October 14, 2018. FINDINGS: The heart size and mediastinal contours are within normal limits. No pneumothorax or pleural effusion is noted. Mild bibasilar subsegmental atelectasis is noted. The visualized skeletal structures are unremarkable. IMPRESSION: Mild bibasilar subsegmental atelectasis is noted which is increased compared to prior exam. Electronically Signed   By: Marijo Conception M.D.   On: 07/29/2019 12:13     Medical Consultants:    None.  Anti-Infectives:   Rocephin  Subjective:    [name] sleepy this  morning no complaints.  Objective:    Vitals:   07/30/19 1633 07/30/19 1900 07/30/19 2029 07/31/19 0421  BP:   (!) 97/50   Pulse: (!) 59  70   Resp:   20   Temp: 97.9 F (36.6 C) 98.3 F (36.8 C) 98.3 F (36.8 C)   TempSrc:  Oral Oral   SpO2: 93%  92%   Weight:    77.9 kg  Height:       SpO2: 92 %   Intake/Output Summary (Last 24 hours) at 07/31/2019  0824 Last data filed at 07/31/2019 0400 Gross per 24 hour  Intake 783.33 ml  Output 304 ml  Net 479.33 ml   Filed Weights   07/29/19 2155 07/30/19 0500 07/31/19 0421  Weight: 79.7 kg 79.7 kg 77.9 kg    Exam: General exam: In no acute distress. Respiratory system: Good air movement and clear to auscultation. Cardiovascular system: S1 & S2 heard, RRR. No JVD Gastrointestinal system: Abdomen is nondistended, soft and nontender.  Central nervous system: Alert and oriented. No focal neurological deficits. Extremities: No pedal edema. Skin: No rashes, lesions or ulcers Psychiatry: Judgement and insight appear normal. Mood & affect appropriate.   Data Reviewed:    Labs: Basic Metabolic Panel: Recent Labs  Lab 07/29/19 1123 07/29/19 2055 07/30/19 0135  NA 137  --  136  K 4.4  --  4.5  CL 100  --  100  CO2 25  --  24  GLUCOSE 107*  --  89  BUN 19  --  22  CREATININE 1.05* 1.05* 0.92  CALCIUM 8.8*  --  8.7*   GFR Estimated Creatinine Clearance: 51.6 mL/min (by C-G formula based on SCr of 0.92 mg/dL). Liver Function Tests: No results for input(s): AST, ALT, ALKPHOS, BILITOT, PROT, ALBUMIN in the last 168 hours. No results for input(s): LIPASE, AMYLASE in the last 168 hours. No results for input(s): AMMONIA in the last 168 hours. Coagulation profile No results for input(s): INR, PROTIME in the last 168 hours. COVID-19 Labs  Recent Labs    07/29/19 2055 07/30/19 0135 07/31/19 0250  DDIMER 1.18* 1.01* 0.93*  CRP 2.0* 2.6* 2.2*    Lab Results  Component Value Date   SARSCOV2NAA POSITIVE (A) 07/29/2019    CBC: Recent Labs  Lab 07/29/19 1123 07/29/19 2055 07/30/19 0135  WBC 2.7* 2.6* 2.9*  HGB 16.8* 16.0* 16.6*  HCT 50.8* 48.7* 50.7*  MCV 91.5 92.6 93.2  PLT 92* 87* 93*   Cardiac Enzymes: No results for input(s): CKTOTAL, CKMB, CKMBINDEX, TROPONINI in the last 168 hours. BNP (last 3 results) No results for input(s): PROBNP in the last 8760 hours. CBG:  No results for input(s): GLUCAP in the last 168 hours. D-Dimer: Recent Labs    07/30/19 0135 07/31/19 0250  DDIMER 1.01* 0.93*   Hgb A1c: No results for input(s): HGBA1C in the last 72 hours. Lipid Profile: No results for input(s): CHOL, HDL, LDLCALC, TRIG, CHOLHDL, LDLDIRECT in the last 72 hours. Thyroid function studies: Recent Labs    07/29/19 1133  TSH 2.301   Anemia work up: No results for input(s): VITAMINB12, FOLATE, FERRITIN, TIBC, IRON, RETICCTPCT in the last 72 hours. Sepsis Labs: Recent Labs  Lab 07/29/19 1123 07/29/19 2055 07/30/19 0135  PROCALCITON  --  <0.10  --   WBC 2.7* 2.6* 2.9*   Microbiology Recent Results (from the past 240 hour(s))  Urine Culture     Status: Abnormal   Collection Time: 07/27/19 12:38 PM  Specimen: Urine  Result Value Ref Range Status   MICRO NUMBER: [id number]  Final   SPECIMEN QUALITY: Adequate  Final   Sample Source NOT GIVEN  Final   STATUS: FINAL  Final   ISOLATE 1: Escherichia coli (A)  Final    Comment: Greater than 100,000 CFU/mL of Escherichia coli      Susceptibility   Escherichia coli - URINE CULTURE, REFLEX    AMOX/CLAVULANIC <=2 Sensitive     AMPICILLIN <=2 Sensitive     AMPICILLIN/SULBACTAM <=2 Sensitive     CEFAZOLIN* <=4 Not Reportable      * For infections other than uncomplicated UTIcaused by E. coli, K. pneumoniae or P. mirabilis:Cefazolin is resistant if MIC > or = 8 mcg/mL.(Distinguishing susceptible versus intermediatefor isolates with MIC < or = 4 mcg/mL requiresadditional testing.)For uncomplicated UTI caused by E. coli,K. pneumoniae or P. mirabilis: Cefazolin issusceptible if MIC <32 mcg/mL and predictssusceptible to the oral agents cefaclor, cefdinir,cefpodoxime, cefprozil, cefuroxime, cephalexinand loracarbef.    CEFEPIME <=1 Sensitive     CEFTRIAXONE <=1 Sensitive     CIPROFLOXACIN <=0.25 Sensitive     LEVOFLOXACIN <=0.12 Sensitive     ERTAPENEM <=0.5 Sensitive     GENTAMICIN <=1 Sensitive      IMIPENEM <=0.25 Sensitive     NITROFURANTOIN <=16 Sensitive     PIP/TAZO <=4 Sensitive     TOBRAMYCIN <=1 Sensitive     TRIMETH/SULFA* <=20 Sensitive      * For infections other than uncomplicated UTIcaused by E. coli, K. pneumoniae or P. mirabilis:Cefazolin is resistant if MIC > or = 8 mcg/mL.(Distinguishing susceptible versus intermediatefor isolates with MIC < or = 4 mcg/mL requiresadditional testing.)For uncomplicated UTI caused by E. coli,K. pneumoniae or P. mirabilis: Cefazolin issusceptible if MIC <32 mcg/mL and predictssusceptible to the oral agents cefaclor, cefdinir,cefpodoxime, cefprozil, cefuroxime, cephalexinand loracarbef.Legend:S = Susceptible  I = IntermediateR = Resistant  NS = Not susceptible* = Not tested  NR = Not reported**NN = See antimicrobic comments  SARS Coronavirus 2 [facility] order, Performed in [facility] hospital lab) Nasopharyngeal Nasopharyngeal Swab     Status: Abnormal   Collection Time: 07/29/19  1:44 PM   Specimen: Nasopharyngeal Swab  Result Value Ref Range Status   SARS Coronavirus 2 POSITIVE (A) NEGATIVE Final    Comment: RESULT CALLED TO, READ BACK BY AND VERIFIED WITH: BRIANNA CHAPMAN 07/29/19 1453 KLW (NOTE) If result is NEGATIVE SARS-CoV-2 target nucleic acids are NOT DETECTED. The SARS-CoV-2 RNA is generally detectable in upper and lower  respiratory specimens during the acute phase of infection. The lowest  concentration of SARS-CoV-2 viral copies this assay can detect is 250  copies / mL. A negative result does not preclude SARS-CoV-2 infection  and should not be used as the sole basis for treatment or other  patient management decisions.  A negative result may occur with  improper specimen collection / handling, submission of specimen other  than nasopharyngeal swab, presence of viral mutation(s) within the  areas targeted by this assay, and inadequate number of viral copies  (<250 copies / mL). A negative result must be combined with clinical   observations, patient history, and epidemiological information. If result is POSITIVE SARS-CoV-2 target nucleic acids are DETECTED. The  SARS-CoV-2 RNA is generally detectable in upper and lower  respiratory specimens during the acute phase of infection.  Positive  results are indicative of active infection with SARS-CoV-2.  Clinical  correlation with patient history and other diagnostic information is  necessary to determine patient  infection status.  Positive results do  not rule out bacterial infection or co-infection with other viruses. If result is PRESUMPTIVE POSTIVE SARS-CoV-2 nucleic acids MAY BE PRESENT.   A presumptive positive result was obtained on the submitted specimen  and confirmed on repeat testing.  While 2019 novel coronavirus  (SARS-CoV-2) nucleic acids may be present in the submitted sample  additional confirmatory testing may be necessary for epidemiological  and / or clinical management purposes  to differentiate between  SARS-CoV-2 and other Sarbecovirus currently known to infect humans.  If clinically indicated additional testing with an alternate test  methodology [phone]) is a dvised. The SARS-CoV-2 RNA is generally  detectable in upper and lower respiratory specimens during the acute  phase of infection. The expected result is Negative. Fact Sheet for Patients:  StrictlyIdeas.no Fact Sheet for Healthcare Providers: BankingDealers.co.za This test is not yet approved or cleared by the [place] FDA and has been authorized for detection and/or diagnosis of SARS-CoV-2 by FDA under an Emergency Use Authorization (EUA).  This EUA will remain in effect (meaning this test can be used) for the duration of the COVID-19 declaration under Section 564(b)(1) of the Act, 21 U.S.C. section 360bbb-3(b)(1), unless the authorization is terminated or revoked sooner. Performed at [facility], [address]  [place], [place] [address]      Medications:   . aspirin EC  81 mg Oral Daily  . dexamethasone (DECADRON) injection  6 mg Intravenous q1800  . enoxaparin (LOVENOX) injection  40 mg Subcutaneous Q24H  . feeding supplement (ENSURE ENLIVE)  237 mL Oral TID BM  . influenza vaccine adjuvanted  0.5 mL Intramuscular Tomorrow-1000  . multivitamin with minerals  1 tablet Oral Daily  . vitamin C  500 mg Oral BID  . zinc sulfate  220 mg Oral Daily   Continuous Infusions: . sodium chloride    . sodium chloride 50 mL/hr at 07/30/19 1720  . cefTRIAXone (ROCEPHIN)  IV 1 g (07/30/19 1720)      LOS: 2 days   Charlynne Cousins  [facility]  07/31/2019, 8:24 AM

## 2019-08-01 ENCOUNTER — Inpatient Hospital Stay (HOSPITAL_COMMUNITY): Payer: Medicare Other

## 2019-08-01 ENCOUNTER — Inpatient Hospital Stay: Payer: Self-pay

## 2019-08-01 LAB — COMPREHENSIVE METABOLIC PANEL
ALT: 15 U/L (ref 0–44)
AST: 30 U/L (ref 15–41)
Albumin: 2.7 g/dL — ABNORMAL LOW (ref 3.5–5.0)
Alkaline Phosphatase: 40 U/L (ref 38–126)
Anion gap: 11 (ref 5–15)
BUN: 29 mg/dL — ABNORMAL HIGH (ref 8–23)
CO2: 21 mmol/L — ABNORMAL LOW (ref 22–32)
Calcium: 8.5 mg/dL — ABNORMAL LOW (ref 8.9–10.3)
Chloride: 106 mmol/L (ref 98–111)
Creatinine, Ser: 0.82 mg/dL (ref 0.44–1.00)
GFR calc Af Amer: >60 mL/min (ref >60)
GFR calc non Af Amer: >60 mL/min (ref >60)
Glucose, Bld: 90 mg/dL (ref 70–99)
Potassium: 3.6 mmol/L (ref 3.5–5.1)
Sodium: 138 mmol/L (ref 135–145)
Total Bilirubin: 0.4 mg/dL (ref 0.3–1.2)
Total Protein: 6 g/dL — ABNORMAL LOW (ref 6.5–8.1)

## 2019-08-01 LAB — C-REACTIVE PROTEIN: CRP: 2.4 mg/dL — ABNORMAL HIGH (ref <1.0)

## 2019-08-01 LAB — D-DIMER, QUANTITATIVE: D-Dimer, Quant: 1.15 ug/mL-FEU — ABNORMAL HIGH (ref 0.00–0.50)

## 2019-08-01 LAB — LACTATE DEHYDROGENASE: LDH: 316 U/L — ABNORMAL HIGH (ref 98–192)

## 2019-08-01 LAB — FERRITIN: Ferritin: 787 ng/mL — ABNORMAL HIGH (ref 11–307)

## 2019-08-01 MED ORDER — RESOURCE THICKENUP CLEAR PO POWD
ORAL | Status: DC | PRN
  Filled 2019-08-01: qty 125

## 2019-08-01 MED ORDER — CHLORHEXIDINE GLUCONATE CLOTH 2 % EX PADS
6.0000 | MEDICATED_PAD | Freq: Every day | CUTANEOUS | Status: DC
  Administered 2019-08-01 – 2019-08-06 (×6): 6 via TOPICAL

## 2019-08-01 MED ORDER — DEXAMETHASONE SODIUM PHOSPHATE 10 MG/ML IJ SOLN
6.0000 mg | Freq: Two times a day (BID) | INTRAMUSCULAR | Status: DC
  Administered 2019-08-01 – 2019-08-05 (×10): 6 mg via INTRAVENOUS
  Filled 2019-08-01 (×10): qty 1

## 2019-08-01 MED ORDER — SODIUM CHLORIDE 0.9% FLUSH: 10.0000 mL | INTRAVENOUS | Status: DC | PRN

## 2019-08-01 MED ORDER — SODIUM CHLORIDE 0.9 % IV SOLN
200.0000 mg | Freq: Once | INTRAVENOUS | Status: AC
  Administered 2019-08-01: 12:00:00 200 mg via INTRAVENOUS
  Filled 2019-08-01: qty 40

## 2019-08-01 MED ORDER — SODIUM CHLORIDE 0.9 % IV SOLN
100.0000 mg | INTRAVENOUS | Status: AC
  Administered 2019-08-02 – 2019-08-05 (×4): 100 mg via INTRAVENOUS
  Filled 2019-08-01 (×4): qty 20

## 2019-08-01 MED ORDER — SODIUM CHLORIDE 0.9% FLUSH
10.0000 mL | Freq: Two times a day (BID) | INTRAVENOUS | Status: DC
  Administered 2019-08-01 – 2019-08-06 (×10): 10 mL

## 2019-08-01 NOTE — Progress Notes (Signed)
[facility] PROGRESS NOTE    Progress Note  Regina Harris  XNA:[id number] DOB: 02/13/1936 DOA: 07/29/2019 PCP: Abner Greenspan, MD     Brief Narrative:   Regina Harris is an 83 y.o. female past medical history significant for hypertension, diabetes mellitus type 2 comes in for 6-day of a UTI and progressive weakness.  Not eating or drinking well.  There is some cough but no shortness of breath she was started on Bactrim with no improvement.  Assessment/Plan:   Acute metabolic encephalopathy multifactorial possibly due to UTI and COVID-19 infection: Her urine culture grew E. coli pansensitive. On admission she was started on IV Rocephin, which should continue for total of 5 days. Blood cultures have remained negative till date. She is still encephalopathic, her inflammatory markers are trending down. We will start her on the IV Remdesivir the steroids. Pro-Calcitonin on admission was checked was less than 0.1.  Essential hypertension: Blood pressure is stable. Continue current home regimen.  Pneumonia due to COVID-19 virus: She is currently satting greater 92% on room air, her inflammatory markers are increasing. I will go ahead and start her on IV Remdesivir and steroids. Try to keep the patient peripherally 16 hours a day. Trend inflammatory markers.  She is mildly leukopenic likely due to COVID-19.  DVT prophylaxis: lovenox Family Communication:none Disposition Plan/Barrier to D/C: unable to determine Code Status:     Code Status Orders  (From admission, onward)         Start     Ordered   07/29/19 2028  Full code  Continuous     07/29/19 2028        Code Status History    This patient has a current code status but no historical code status.   Advance Care Planning Activity    Advance Directive Documentation     Most Recent Value  Type of Advance Directive  Living will  Pre-existing out of facility DNR order (yellow form or pink MOST form)  -  "MOST"  Form in Place?  -        IV Access:    Peripheral IV   Procedures and diagnostic studies:   No results found.   Medical Consultants:    None.  Anti-Infectives:   Rocephin  Subjective:    Regina Harris sleepy this morning able to carry on a conversation no complaints.  Objective:    Vitals:   08/01/19 0139 08/01/19 0417 08/01/19 0419 08/01/19 0805  BP: (!) 121/59 113/62  (!) 118/59  Pulse: 85 (!) 57  70  Resp: (!) 23 (!) 22    Temp: 98.9 F (37.2 C) 100.2 F (37.9 C)  98.5 F (36.9 C)  TempSrc: Oral Oral  Oral  SpO2: 93% 93%  98%  Weight:   79.2 kg   Height:       SpO2: 98 %   Intake/Output Summary (Last 24 hours) at 08/01/2019 0857 Last data filed at 08/01/2019 5427 Gross per 24 hour  Intake 900 ml  Output 162 ml  Net 738 ml   Filed Weights   07/30/19 0500 07/31/19 0421 08/01/19 0419  Weight: 79.7 kg 77.9 kg 79.2 kg    Exam: General exam: In no acute distress. Respiratory system: Good air movement and diffuse crackles bilaterally. Cardiovascular system: S1 & S2 heard, RRR. No JVD. Gastrointestinal system: Abdomen is nondistended, soft and nontender.  Central nervous system: Alert and oriented. No focal neurological deficits. Extremities: No pedal edema. Skin: No rashes,  lesions or ulcers Psychiatry: Sleepy today.   Data Reviewed:    Labs: Basic Metabolic Panel: Recent Labs  Lab 07/29/19 1123 07/29/19 2055 07/30/19 0135  NA 137  --  136  K 4.4  --  4.5  CL 100  --  100  CO2 25  --  24  GLUCOSE 107*  --  89  BUN 19  --  22  CREATININE 1.05* 1.05* 0.92  CALCIUM 8.8*  --  8.7*   GFR Estimated Creatinine Clearance: 51.9 mL/min (by C-G formula based on SCr of 0.92 mg/dL). Liver Function Tests: No results for input(s): AST, ALT, ALKPHOS, BILITOT, PROT, ALBUMIN in the last 168 hours. No results for input(s): LIPASE, AMYLASE in the last 168 hours. No results for input(s): AMMONIA in the last 168 hours. Coagulation profile No  results for input(s): INR, PROTIME in the last 168 hours. COVID-19 Labs  Recent Labs    07/30/19 0135 07/31/19 0250 08/01/19 0351  DDIMER 1.01* 0.93* 1.15*  FERRITIN  --  689* 787*  LDH  --  292* 316*  CRP 2.6* 2.2* 2.4*    Lab Results  Component Value Date   SARSCOV2NAA POSITIVE (A) 07/29/2019    CBC: Recent Labs  Lab 07/29/19 1123 07/29/19 2055 07/30/19 0135  WBC 2.7* 2.6* 2.9*  HGB 16.8* 16.0* 16.6*  HCT 50.8* 48.7* 50.7*  MCV 91.5 92.6 93.2  PLT 92* 87* 93*   Cardiac Enzymes: No results for input(s): CKTOTAL, CKMB, CKMBINDEX, TROPONINI in the last 168 hours. BNP (last 3 results) No results for input(s): PROBNP in the last 8760 hours. CBG: No results for input(s): GLUCAP in the last 168 hours. D-Dimer: Recent Labs    07/31/19 0250 08/01/19 0351  DDIMER 0.93* 1.15*   Hgb A1c: No results for input(s): HGBA1C in the last 72 hours. Lipid Profile: No results for input(s): CHOL, HDL, LDLCALC, TRIG, CHOLHDL, LDLDIRECT in the last 72 hours. Thyroid function studies: Recent Labs    07/29/19 1133  TSH 2.301   Anemia work up: Recent Labs    07/31/19 0250 08/01/19 0351  FERRITIN 689* 787*   Sepsis Labs: Recent Labs  Lab 07/29/19 1123 07/29/19 2055 07/30/19 0135  PROCALCITON  --  <0.10  --   WBC 2.7* 2.6* 2.9*   Microbiology Recent Results (from the past 240 hour(s))  Urine Culture     Status: Abnormal   Collection Time: 07/27/19 12:38 PM   Specimen: Urine  Result Value Ref Range Status   MICRO NUMBER: [id number]  Final   SPECIMEN QUALITY: Adequate  Final   Sample Source NOT GIVEN  Final   STATUS: FINAL  Final   ISOLATE 1: Escherichia coli (A)  Final    Comment: Greater than 100,000 CFU/mL of Escherichia coli      Susceptibility   Escherichia coli - URINE CULTURE, REFLEX    AMOX/CLAVULANIC <=2 Sensitive     AMPICILLIN <=2 Sensitive     AMPICILLIN/SULBACTAM <=2 Sensitive     CEFAZOLIN* <=4 Not Reportable      * For infections other than  uncomplicated UTIcaused by E. coli, K. pneumoniae or P. mirabilis:Cefazolin is resistant if MIC > or = 8 mcg/mL.(Distinguishing susceptible versus intermediatefor isolates with MIC < or = 4 mcg/mL requiresadditional testing.)For uncomplicated UTI caused by E. coli,K. pneumoniae or P. mirabilis: Cefazolin issusceptible if MIC <32 mcg/mL and predictssusceptible to the oral agents cefaclor, cefdinir,cefpodoxime, cefprozil, cefuroxime, cephalexinand loracarbef.    CEFEPIME <=1 Sensitive     CEFTRIAXONE <=1 Sensitive  CIPROFLOXACIN <=0.25 Sensitive     LEVOFLOXACIN <=0.12 Sensitive     ERTAPENEM <=0.5 Sensitive     GENTAMICIN <=1 Sensitive     IMIPENEM <=0.25 Sensitive     NITROFURANTOIN <=16 Sensitive     PIP/TAZO <=4 Sensitive     TOBRAMYCIN <=1 Sensitive     TRIMETH/SULFA* <=20 Sensitive      * For infections other than uncomplicated UTIcaused by E. coli, K. pneumoniae or P. mirabilis:Cefazolin is resistant if MIC > or = 8 mcg/mL.(Distinguishing susceptible versus intermediatefor isolates with MIC < or = 4 mcg/mL requiresadditional testing.)For uncomplicated UTI caused by E. coli,K. pneumoniae or P. mirabilis: Cefazolin issusceptible if MIC <32 mcg/mL and predictssusceptible to the oral agents cefaclor, cefdinir,cefpodoxime, cefprozil, cefuroxime, cephalexinand loracarbef.Legend:S = Susceptible  I = IntermediateR = Resistant  NS = Not susceptible* = Not tested  NR = Not reported**NN = See antimicrobic comments  SARS Coronavirus 2 [facility] order, Performed in [facility] hospital lab) Nasopharyngeal Nasopharyngeal Swab     Status: Abnormal   Collection Time: 07/29/19  1:44 PM   Specimen: Nasopharyngeal Swab  Result Value Ref Range Status   SARS Coronavirus 2 POSITIVE (A) NEGATIVE Final    Comment: RESULT CALLED TO, READ BACK BY AND VERIFIED WITH: BRIANNA CHAPMAN 07/29/19 1453 KLW (NOTE) If result is NEGATIVE SARS-CoV-2 target nucleic acids are NOT DETECTED. The SARS-CoV-2 RNA is generally  detectable in upper and lower  respiratory specimens during the acute phase of infection. The lowest  concentration of SARS-CoV-2 viral copies this assay can detect is 250  copies / mL. A negative result does not preclude SARS-CoV-2 infection  and should not be used as the sole basis for treatment or other  patient management decisions.  A negative result may occur with  improper specimen collection / handling, submission of specimen other  than nasopharyngeal swab, presence of viral mutation(s) within the  areas targeted by this assay, and inadequate number of viral copies  (<250 copies / mL). A negative result must be combined with clinical  observations, patient history, and epidemiological information. If result is POSITIVE SARS-CoV-2 target nucleic acids are DETECTED. The  SARS-CoV-2 RNA is generally detectable in upper and lower  respiratory specimens during the acute phase of infection.  Positive  results are indicative of active infection with SARS-CoV-2.  Clinical  correlation with patient history and other diagnostic information is  necessary to determine patient infection status.  Positive results do  not rule out bacterial infection or co-infection with other viruses. If result is PRESUMPTIVE POSTIVE SARS-CoV-2 nucleic acids MAY BE PRESENT.   A presumptive positive result was obtained on the submitted specimen  and confirmed on repeat testing.  While 2019 novel coronavirus  (SARS-CoV-2) nucleic acids may be present in the submitted sample  additional confirmatory testing may be necessary for epidemiological  and / or clinical management purposes  to differentiate between  SARS-CoV-2 and other Sarbecovirus currently known to infect humans.  If clinically indicated additional testing with an alternate test  methodology [phone]) is a dvised. The SARS-CoV-2 RNA is generally  detectable in upper and lower respiratory specimens during the acute  phase of infection. The  expected result is Negative. Fact Sheet for Patients:  StrictlyIdeas.no Fact Sheet for Healthcare Providers: BankingDealers.co.za This test is not yet approved or cleared by the [place] FDA and has been authorized for detection and/or diagnosis of SARS-CoV-2 by FDA under an Emergency Use Authorization (EUA).  This EUA will remain in effect (meaning this  test can be used) for the duration of the COVID-19 declaration under Section 564(b)(1) of the Act, 21 U.S.C. section 360bbb-3(b)(1), unless the authorization is terminated or revoked sooner. Performed at [facility], [address] [place], [place] [address]      Medications:   . aspirin EC  81 mg Oral Daily  . enoxaparin (LOVENOX) injection  40 mg Subcutaneous Q24H  . feeding supplement (ENSURE ENLIVE)  237 mL Oral TID BM  . influenza vaccine adjuvanted  0.5 mL Intramuscular Tomorrow-1000  . mouth rinse  15 mL Mouth Rinse BID  . multivitamin with minerals  1 tablet Oral Daily  . sodium chloride flush  3 mL Intravenous Q12H  . vitamin C  500 mg Oral BID  . zinc sulfate  220 mg Oral Daily   Continuous Infusions: . sodium chloride    . cefTRIAXone (ROCEPHIN)  IV 1 g (07/31/19 1726)      LOS: 3 days   Charlynne Cousins  [facility]  08/01/2019, 8:57 AM

## 2019-08-01 NOTE — Progress Notes (Signed)
Peripherally Inserted Central Catheter/Midline Placement  The IV Nurse has discussed with the patient and/or persons authorized to consent for the patient, the purpose of this procedure and the potential benefits and risks involved with this procedure.  The benefits include less needle sticks, lab draws from the catheter, and the patient may be discharged home with the catheter. Risks include, but not limited to, infection, bleeding, blood clot (thrombus formation), and puncture of an artery; nerve damage and irregular heartbeat and possibility to perform a PICC exchange if needed/ordered by physician.  Alternatives to this procedure were also discussed.  Bard Power PICC patient education guide, fact sheet on infection prevention and patient information card has been provided to patient /or left at bedside.  Consent obtained via telephone from granddaughter due to altered mental status.    PICC/Midline Placement Documentation  PICC Single Lumen 08/01/19 PICC Right Brachial 37 cm 0 cm (Active)  Indication for Insertion or Continuance of Line Prolonged intravenous therapies;Poor Vasculature-patient has had multiple peripheral attempts or PIVs lasting less than 24 hours;Limited venous access - need for IV therapy >5 days (PICC only) 08/01/19 1844  Exposed Catheter (cm) 0 cm 08/01/19 1844  Site Assessment Clean;Dry;Intact 08/01/19 1844  Line Status Flushed;Saline locked;Blood return noted 08/01/19 1844  Dressing Type Transparent 08/01/19 1844  Dressing Status Clean;Dry;Intact 08/01/19 1844  Dressing Intervention New dressing 08/01/19 1844  Dressing Change Due 08/08/19 08/01/19 1844       Regina Harris, Nicolette Bang 08/01/2019, 6:45 PM

## 2019-08-01 NOTE — Progress Notes (Signed)
Pt contacts, both her daughter and granddaughter were updated at the beginning of the shift, they were both able to speak with the patient via phone. The pt is very weak, difficult to stay awake at times. Encouraged frequent, small periods of oral hydration, pt only took small amounts. Urine is amber/clear. Tmax 100.3 this shift. Saturations WNL

## 2019-08-01 NOTE — Evaluation (Signed)
Clinical/Bedside Swallow Evaluation Patient Details  Name: Regina Harris MRN: [id number] Date of Birth: 14-Dec-1935  Today's Date: 08/01/2019 Time: SLP Start Time (ACUTE ONLY): 5638 SLP Stop Time (ACUTE ONLY): 1513 SLP Time Calculation (min) (ACUTE ONLY): 20 min  Past Medical History:  Past Medical History:  Diagnosis Date  . Barrett esophagus   . Carotid bruit   . Diabetes mellitus without complication ([place])   . Esophagitis   . Gastritis 2013  . GERD (gastroesophageal reflux disease)   . HH (hiatus hernia)   . History of colonic polyps   . History of repair of right rotator cuff   . Hyperlipidemia   . Hypertension   . Hyperthyroidism 06/09/2018   Per [facility] RAI Therapy for Hyperthyroidism order  . Lump or mass in breast   . Pneumonia   . Polycythemia, secondary   . Tobacco abuse    Past Surgical History:  Past Surgical History:  Procedure Laterality Date  . ABDOMINAL HYSTERECTOMY    . APPENDECTOMY    . BREAST BIOPSY Right 1996  . BREAST BIOPSY Left 10/09/2012   Benign breast tissue with focal fat necrosis and focal periductal chronic inflammation.  Marland Kitchen BREAST BIOPSY Left 10/09/2012   Benign breast tissue with focal fat necrosis and focal periductal chronic inflammation.  Marland Kitchen CATARACT EXTRACTION  3/11   Dr Charise Killian  . CHOLECYSTECTOMY    . COLONOSCOPY  2009  . ESOPHAGOGASTRODUODENOSCOPY (EGD) WITH PROPOFOL N/A 11/02/2015   Procedure: ESOPHAGOGASTRODUODENOSCOPY (EGD) WITH PROPOFOL;  Surgeon: Manya Silvas, MD;  Location: [facility] ENDOSCOPY;  Service: Endoscopy;  Laterality: N/A;  . SHOULDER ARTHROSCOPY WITH OPEN ROTATOR CUFF REPAIR Right 03/05/2016   Procedure: SHOULDER ARTHROSCOPY WITH OPEN ROTATOR CUFF REPAIR;  Surgeon: Earnestine Leys, MD;  Location: [facility] ORS;  Service: Orthopedics;  Laterality: Right;  . UPPER GI ENDOSCOPY  2013   HPI:  Pt is an 83 yo female admitted with UTI and progressive weakness, found to have COVID-19. PMH:  HTN, DM-2, HLD, GERD, esophagitis, HH, Barrett's  esophagus   Assessment / Plan / Recommendation Clinical Impression  Pt is drowsy and has overall deconditioning, having difficulty masticating even soft solids per RN. Coughing is elicited with self-fed trials of thin liquids and large, sequential boluses of nectar thick liquids, but when SLP provides assist to self-feed nectar thick liquids at a more regulated rate, there are no further s/s concerning for aspiration. Per RN, pt was independent PTA and pt denies any previous swallowing difficulties. Recommend changing diet to Dys 1 solids and nectar thick liquids with careful assistance during meals. She would likely have good potential to advance as she can recover from this acute illness.  SLP Visit Diagnosis: Dysphagia, unspecified (R13.10)    Aspiration Risk  Mild aspiration risk    Diet Recommendation Dysphagia 1 (Puree);Nectar-thick liquid   Liquid Administration via: Cup;Straw Medication Administration: Crushed with puree Supervision: Staff to assist with self feeding;Full supervision/cueing for compensatory strategies Compensations: Minimize environmental distractions;Slow rate;Small sips/bites Postural Changes: Seated upright at 90 degrees    Other  Recommendations Oral Care Recommendations: Oral care BID Other Recommendations: Order thickener from pharmacy;Prohibited food (jello, ice cream, thin soups);Remove water pitcher   Follow up Recommendations Skilled Nursing facility      Frequency and Duration min 2x/week  2 weeks       Prognosis Prognosis for Safe Diet Advancement: Good      Swallow Study   General HPI: Pt is an 83 yo female admitted with UTI and progressive weakness, found  to have COVID-19. PMH:  HTN, DM-2, HLD, GERD, esophagitis, HH, Barrett's esophagus Type of Study: Bedside Swallow Evaluation Previous Swallow Assessment: none in chart Diet Prior to this Study: Regular;Thin liquids Temperature Spikes Noted: Yes(100.3) Respiratory Status: Room  air History of Recent Intubation: No Behavior/Cognition: Lethargic/Drowsy;Cooperative;Pleasant mood Oral Cavity Assessment: Dry Oral Care Completed by SLP: No Oral Cavity - Dentition: Edentulous;Dentures, not available Vision: Functional for self-feeding Self-Feeding Abilities: Needs assist Patient Positioning: Upright in bed Baseline Vocal Quality: Normal Volitional Swallow: Able to elicit    Oral/Motor/Sensory Function Overall Oral Motor/Sensory Function: Generalized oral weakness   Ice Chips Ice chips: Within functional limits Presentation: Spoon   Thin Liquid Thin Liquid: Impaired Presentation: Cup;Self Fed Pharyngeal  Phase Impairments: Cough - Immediate    Nectar Thick Nectar Thick Liquid: Impaired Presentation: Cup;Self Fed;Straw Pharyngeal Phase Impairments: Cough - Immediate   Honey Thick Honey Thick Liquid: Not tested   Puree Puree: Within functional limits Presentation: Spoon   Solid     Solid: Not tested      Venita Sheffield Malichi Palardy 08/01/2019,3:35 PM  Pollyann Glen, M.A. [place] Acute Environmental education officer [phone] Office [phone]

## 2019-08-01 NOTE — Progress Notes (Signed)
Patient able to face time with daughter, Hoyle Sauer.  All questions/concerns addressed.  Earleen Reaper RN

## 2019-08-01 NOTE — Progress Notes (Addendum)
Spoke with granddaughter, she is updated at this time and all questions answered.   She reports she will updated pts daughter (her mother). They would like to video chat later this afternoon if pt becomes more alert,

## 2019-08-01 NOTE — Progress Notes (Signed)
Remdesivir - Pharmacy Brief Note   O:  ALT: 15 CXR: (9/23) Mild bibasilar subsegmental atelectasis is noted which is increased compared to prior exam. SpO2: 98% on room air   A/P:  Remdesivir 200 mg once followed by 100 mg daily x 4 days.   Gretta Arab PharmD, BCPS Clinical pharmacist phone 7am- 5pm: [phone] 08/01/2019 9:16 AM

## 2019-08-02 LAB — COMPREHENSIVE METABOLIC PANEL
ALT: 22 U/L (ref 0–44)
AST: 37 U/L (ref 15–41)
Albumin: 2.7 g/dL — ABNORMAL LOW (ref 3.5–5.0)
Alkaline Phosphatase: 43 U/L (ref 38–126)
Anion gap: 8 (ref 5–15)
BUN: 30 mg/dL — ABNORMAL HIGH (ref 8–23)
CO2: 25 mmol/L (ref 22–32)
Calcium: 8.8 mg/dL — ABNORMAL LOW (ref 8.9–10.3)
Chloride: 107 mmol/L (ref 98–111)
Creatinine, Ser: 0.62 mg/dL (ref 0.44–1.00)
GFR calc Af Amer: >60 mL/min (ref >60)
GFR calc non Af Amer: >60 mL/min (ref >60)
Glucose, Bld: 145 mg/dL — ABNORMAL HIGH (ref 70–99)
Potassium: 3.8 mmol/L (ref 3.5–5.1)
Sodium: 140 mmol/L (ref 135–145)
Total Bilirubin: 0.7 mg/dL (ref 0.3–1.2)
Total Protein: 6.2 g/dL — ABNORMAL LOW (ref 6.5–8.1)

## 2019-08-02 LAB — C-REACTIVE PROTEIN: CRP: 2.6 mg/dL — ABNORMAL HIGH (ref <1.0)

## 2019-08-02 LAB — D-DIMER, QUANTITATIVE: D-Dimer, Quant: 1.22 ug/mL-FEU — ABNORMAL HIGH (ref 0.00–0.50)

## 2019-08-02 LAB — FERRITIN: Ferritin: 677 ng/mL — ABNORMAL HIGH (ref 11–307)

## 2019-08-02 LAB — LACTATE DEHYDROGENASE: LDH: 277 U/L — ABNORMAL HIGH (ref 98–192)

## 2019-08-02 NOTE — NC FL2 (Signed)
[place] LEVEL OF CARE SCREENING TOOL     IDENTIFICATION  Patient Name: Regina Harris Birthdate: Jun 07, 1936 Sex: female Admission Date (Current Location): 07/29/2019  [facility] and [place] Number:  Herbalist and Address:  [facility], [place], [place] [address]([facility])      Provider Number: [id number]  Attending Physician Name and Address:  Charlynne Cousins, MD  Relative Name and Phone Number:  Hoyle Sauer ([id number])[phone]    Current Level of Care: Hospital Recommended Level of Care: [place] Prior Approval Number:    Date Approved/Denied:   PASRR Number: [id number]  Discharge Plan: SNF    Current Diagnoses: Patient Active Problem List   Diagnosis Date Noted  . Acute metabolic encephalopathy 96/29/5284  . Acute lower UTI 07/29/2019  . Pneumonia due to COVID-19 virus 07/29/2019  . COVID-19 virus infection 07/29/2019  . UTI (urinary tract infection) 07/27/2019  . Nasal congestion 07/27/2019  . B12 deficiency 01/05/2019  . Memory loss 12/30/2018  . Poor balance 12/30/2018  . Low back pain 10/14/2018  . Fatigue 10/01/2018  . Abnormal urinalysis 10/01/2018  . 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 Right leg swelling 06/09/2018  . Right knee pain 06/09/2018  . Baker's cyst of knee, right 06/09/2018  . Encounter for screening mammogram for breast cancer 02/27/2017  . Estrogen deficiency 02/27/2017  . Foot pain, bilateral 02/27/2017  . Hypothyroidism 04/16/2016  . Pedal edema 04/05/2016  . Right rotator cuff tear 03/09/2016  . Hypokalemia 03/01/2016  . Nodule of chest wall 10/06/2014  . Left breast mass 06/30/2014  . Encounter for Medicare annual wellness exam 02/22/2014  . Polycythemia, secondary 02/22/2014  . GERD (gastroesophageal reflux disease) 08/24/2013  . Lump or mass in breast 05/06/2013  . Post-menopausal 06/24/2012  . Obesity 12/24/2011  . Prediabetes  03/12/2011  . Hyperglycemia 03/12/2011  . NEOPLASM UNSPECIFIED NATURE DIGESTIVE SYSTEM 07/24/2010  . LUNG NODULE 01/16/2010  . HYPERCHOLESTEROLEMIA, PURE 07/10/2007  . Smoker 03/19/2007  . Essential hypertension 03/19/2007  . Carotid bruit 03/19/2007  . COLONIC POLYPS, HX OF 03/19/2007    Orientation RESPIRATION BLADDER Height & Weight     Self, Place  Normal Continent Weight: 174 lb 9.7 oz (79.2 kg) Height:  5\' 7"  (170.2 cm)  BEHAVIORAL SYMPTOMS/MOOD NEUROLOGICAL BOWEL NUTRITION STATUS      Continent Diet(see discharge summary)  AMBULATORY STATUS COMMUNICATION OF NEEDS Skin   Extensive Assist Verbally Other (Comment)(MASD left breast)                       Personal Care Assistance Level of Assistance  Bathing, Feeding, Dressing, Total care Bathing Assistance: Limited assistance Feeding assistance: Limited assistance Dressing Assistance: Limited assistance Total Care Assistance: Maximum assistance   Functional Limitations Info  Hearing, Speech, Sight Sight Info: Adequate Hearing Info: Adequate Speech Info: Adequate    SPECIAL CARE FACTORS FREQUENCY  PT (By licensed PT), OT (By licensed OT)     PT Frequency: min 5x weekly OT Frequency: min 5x weekly            Contractures Contractures Info: Not present    Additional Factors Info  Allergies, Code Status, Isolation Precautions Code Status Info: Full Allergies Info: No Known Allergies     Isolation Precautions Info: emerging pathogen, air and contact precautions     Current Medications (08/02/2019):  This is the current hospital active medication list Current Facility-Administered Medications  Medication Dose Route Frequency Provider  Last Rate Last Dose  . 0.9 %  sodium chloride infusion  250 mL Intravenous PRN Phillips Grout, MD      . acetaminophen (TYLENOL) tablet 650 mg  650 mg Oral Q6H PRN Phillips Grout, MD       Or  . acetaminophen (TYLENOL) suppository 650 mg  650 mg Rectal Q6H PRN Phillips Grout, MD      . aspirin EC tablet 81 mg  81 mg Oral Daily Derrill Kay A, MD   81 mg at 08/02/19 0844  . cefTRIAXone (ROCEPHIN) 1 g in sodium chloride 0.9 % 100 mL IVPB  1 g Intravenous Q24H Phillips Grout, MD   Stopped at 08/01/19 2100  . Chlorhexidine Gluconate Cloth 2 % PADS 6 each  6 each Topical Daily Charlynne Cousins, MD   6 each at 08/02/19 [phone]  . dexamethasone (DECADRON) injection 6 mg  6 mg Intravenous Q12H Charlynne Cousins, MD   6 mg at 08/02/19 0845  . enoxaparin (LOVENOX) injection 40 mg  40 mg Subcutaneous Q24H Derrill Kay A, MD   40 mg at 08/01/19 2229  . feeding supplement (ENSURE ENLIVE) (ENSURE ENLIVE) liquid 237 mL  237 mL Oral TID BM Charlynne Cousins, MD   237 mL at 08/02/19 1408  . influenza vaccine adjuvanted (FLUAD) injection 0.5 mL  0.5 mL Intramuscular Tomorrow-1000 Phillips Grout, MD   Stopped at 07/31/19 1418  . MEDLINE mouth rinse  15 mL Mouth Rinse BID Charlynne Cousins, MD   15 mL at 08/02/19 0800  . multivitamin with minerals tablet 1 tablet  1 tablet Oral Daily Charlynne Cousins, MD   1 tablet at 08/02/19 [phone]  . ondansetron (ZOFRAN) tablet 4 mg  4 mg Oral Q6H PRN Phillips Grout, MD       Or  . ondansetron [facility]) injection 4 mg  4 mg Intravenous Q6H PRN Phillips Grout, MD      . remdesivir 100 mg in sodium chloride 0.9 % 250 mL IVPB  100 mg Intravenous Q24H Charlynne Cousins, MD 500 mL/hr at 08/02/19 0851 100 mg at 08/02/19 0851  . Resource ThickenUp Clear   Oral PRN Charlynne Cousins, MD      . sodium chloride flush (NS) 0.9 % injection 10-40 mL  10-40 mL Intracatheter Q12H Charlynne Cousins, MD   10 mL at 08/02/19 0917  . sodium chloride flush (NS) 0.9 % injection 10-40 mL  10-40 mL Intracatheter PRN Charlynne Cousins, MD      . vitamin C (ASCORBIC ACID) tablet 500 mg  500 mg Oral BID Phillips Grout, MD   500 mg at 08/02/19 0844  . zinc sulfate capsule 220 mg  220 mg Oral Daily Phillips Grout, MD   220 mg at 08/02/19  [id number]     Discharge Medications: Please see discharge summary for a list of discharge medications.  Relevant Imaging Results:  Relevant Lab Results:   Additional Information SSN: [id number]  Alberteen Sam, LCSW

## 2019-08-02 NOTE — Plan of Care (Signed)

## 2019-08-02 NOTE — Progress Notes (Signed)
[facility] PROGRESS NOTE    Progress Note  Regina Harris  WVP:[id number] DOB: Jan 26, 1936 DOA: 07/29/2019 PCP: Abner Greenspan, MD     Brief Narrative:   Regina Harris is an 83 y.o. female past medical history significant for hypertension, diabetes mellitus type 2 comes in for 6-day of a UTI and progressive weakness.  Not eating or drinking well.  There is some cough but no shortness of breath she was started on Bactrim with no improvement.  Assessment/Plan:   Acute metabolic encephalopathy multifactorial possibly due to UTI and COVID-19 infection: Her urine culture grew E. coli pansensitive. She was placed on IV Rocephin on admission today we will complete a 5 days course. Cultures have remained negative.  Her encephalopathy is slowly resolving, inflammatory markers are trending up see below for further details.  Essential hypertension: Blood pressure is stable. Continue current home regimen.  Pneumonia due to COVID-19 virus: She is currently satting greater 92% on room air, her inflammatory markers are still increasing. Continue IV Remdesivir and steroids. Try to keep her prone for at least 16 hours a day. Leukopenia is likely due to COVID-19.  DVT prophylaxis: lovenox Family Communication:none Disposition Plan/Barrier to D/C: unable to determine Code Status:     Code Status Orders  (From admission, onward)         Start     Ordered   07/29/19 2028  Full code  Continuous     07/29/19 2028        Code Status History    This patient has a current code status but no historical code status.   Advance Care Planning Activity    Advance Directive Documentation     Most Recent Value  Type of Advance Directive  Living will  Pre-existing out of facility DNR order (yellow form or pink MOST form)  -  "MOST" Form in Place?  -        IV Access:    Peripheral IV   Procedures and diagnostic studies:   Dg Chest Port 1 View  Result Date: 08/01/2019 CLINICAL  DATA:  Cough.  Covid positive. EXAM: PORTABLE CHEST 1 VIEW COMPARISON:  07/29/2019 FINDINGS: 0958 hours. Lungs are mildly hyperexpanded. The peripheral interstitial and airspace opacity seen in the left mid lung today appears progressive in the interval. Progressive airspace disease at the left base obscures the hemidiaphragm. Similar appearance of interstitial and airspace opacity at the right base. Prominent skin fold over the right upper lung. The cardiopericardial silhouette is within normal limits for size. The visualized bony structures of the thorax are intact. IMPRESSION: Interval progression of interstitial and airspace disease in the left mid lung and left base. Similar appearance of right basilar airspace opacity Electronically Signed   By: Misty Stanley M.D.   On: 08/01/2019 11:51   [place] Ekg Site Rite  Result Date: 08/01/2019 If Site Rite image not attached, placement could not be confirmed due to current cardiac rhythm.    Medical Consultants:    None.  Anti-Infectives:   Rocephin  Subjective:    Regina Harris more awake this morning relating that she is hungry.  Objective:    Vitals:   08/02/19 0300 08/02/19 0400 08/02/19 0500 08/02/19 0600  BP:  (!) 147/68    Pulse: (!) 41 (!) 50 (!) 50 (!) 40  Resp:  20    Temp:  (!) 97.4 F (36.3 C)    TempSrc:  Oral    SpO2: 93% 96% 94% 97%  Weight:      Height:       SpO2: 97 %   Intake/Output Summary (Last 24 hours) at 08/02/2019 0833 Last data filed at 08/02/2019 0600 Gross per 24 hour  Intake 590 ml  Output 0 ml  Net 590 ml   Filed Weights   07/30/19 0500 07/31/19 0421 08/01/19 0419  Weight: 79.7 kg 77.9 kg 79.2 kg    Exam: General exam: In no acute distress. Respiratory system: Good air movement and diffuse crackles bilaterally. Cardiovascular system: S1 & S2 heard, RRR. No JVD. Gastrointestinal system: Abdomen is nondistended, soft and nontender.  Central nervous system: Alert and oriented. No focal  neurological deficits. Extremities: No pedal edema. Skin: No rashes, lesions or ulcers Psychiatry: Judgement and insight appear normal.   Data Reviewed:    Labs: Basic Metabolic Panel: Recent Labs  Lab 07/29/19 1123 07/29/19 2055 07/30/19 0135 08/01/19 0351 08/02/19 0515  NA 137  --  136 138 140  K 4.4  --  4.5 3.6 3.8  CL 100  --  100 106 107  CO2 25  --  24 21* 25  GLUCOSE 107*  --  89 90 145*  BUN 19  --  22 29* 30*  CREATININE 1.05* 1.05* 0.92 0.82 0.62  CALCIUM 8.8*  --  8.7* 8.5* 8.8*   GFR Estimated Creatinine Clearance: 59.7 mL/min (by C-G formula based on SCr of 0.62 mg/dL). Liver Function Tests: Recent Labs  Lab 08/01/19 0351 08/02/19 0515  AST 30 37  ALT 15 22  ALKPHOS 40 43  BILITOT 0.4 0.7  PROT 6.0* 6.2*  ALBUMIN 2.7* 2.7*   No results for input(s): LIPASE, AMYLASE in the last 168 hours. No results for input(s): AMMONIA in the last 168 hours. Coagulation profile No results for input(s): INR, PROTIME in the last 168 hours. COVID-19 Labs  Recent Labs    07/31/19 0250 08/01/19 0351 08/02/19 0515  DDIMER 0.93* 1.15* 1.22*  FERRITIN 689* 787* 677*  LDH 292* 316* 277*  CRP 2.2* 2.4* 2.6*    Lab Results  Component Value Date   SARSCOV2NAA POSITIVE (A) 07/29/2019    CBC: Recent Labs  Lab 07/29/19 1123 07/29/19 2055 07/30/19 0135  WBC 2.7* 2.6* 2.9*  HGB 16.8* 16.0* 16.6*  HCT 50.8* 48.7* 50.7*  MCV 91.5 92.6 93.2  PLT 92* 87* 93*   Cardiac Enzymes: No results for input(s): CKTOTAL, CKMB, CKMBINDEX, TROPONINI in the last 168 hours. BNP (last 3 results) No results for input(s): PROBNP in the last 8760 hours. CBG: No results for input(s): GLUCAP in the last 168 hours. D-Dimer: Recent Labs    08/01/19 0351 08/02/19 0515  DDIMER 1.15* 1.22*   Hgb A1c: No results for input(s): HGBA1C in the last 72 hours. Lipid Profile: No results for input(s): CHOL, HDL, LDLCALC, TRIG, CHOLHDL, LDLDIRECT in the last 72 hours. Thyroid function  studies: No results for input(s): TSH, T4TOTAL, T3FREE, THYROIDAB in the last 72 hours.  Invalid input(s): FREET3 Anemia work up: Recent Labs    08/01/19 0351 08/02/19 0515  FERRITIN 787* 677*   Sepsis Labs: Recent Labs  Lab 07/29/19 1123 07/29/19 2055 07/30/19 0135  PROCALCITON  --  <0.10  --   WBC 2.7* 2.6* 2.9*   Microbiology Recent Results (from the past 240 hour(s))  Urine Culture     Status: Abnormal   Collection Time: 07/27/19 12:38 PM   Specimen: Urine  Result Value Ref Range Status   MICRO NUMBER: [id number]  Final  SPECIMEN QUALITY: Adequate  Final   Sample Source NOT GIVEN  Final   STATUS: FINAL  Final   ISOLATE 1: Escherichia coli (A)  Final    Comment: Greater than 100,000 CFU/mL of Escherichia coli      Susceptibility   Escherichia coli - URINE CULTURE, REFLEX    AMOX/CLAVULANIC <=2 Sensitive     AMPICILLIN <=2 Sensitive     AMPICILLIN/SULBACTAM <=2 Sensitive     CEFAZOLIN* <=4 Not Reportable      * For infections other than uncomplicated UTIcaused by E. coli, K. pneumoniae or P. mirabilis:Cefazolin is resistant if MIC > or = 8 mcg/mL.(Distinguishing susceptible versus intermediatefor isolates with MIC < or = 4 mcg/mL requiresadditional testing.)For uncomplicated UTI caused by E. coli,K. pneumoniae or P. mirabilis: Cefazolin issusceptible if MIC <32 mcg/mL and predictssusceptible to the oral agents cefaclor, cefdinir,cefpodoxime, cefprozil, cefuroxime, cephalexinand loracarbef.    CEFEPIME <=1 Sensitive     CEFTRIAXONE <=1 Sensitive     CIPROFLOXACIN <=0.25 Sensitive     LEVOFLOXACIN <=0.12 Sensitive     ERTAPENEM <=0.5 Sensitive     GENTAMICIN <=1 Sensitive     IMIPENEM <=0.25 Sensitive     NITROFURANTOIN <=16 Sensitive     PIP/TAZO <=4 Sensitive     TOBRAMYCIN <=1 Sensitive     TRIMETH/SULFA* <=20 Sensitive      * For infections other than uncomplicated UTIcaused by E. coli, K. pneumoniae or P. mirabilis:Cefazolin is resistant if MIC > or = 8  mcg/mL.(Distinguishing susceptible versus intermediatefor isolates with MIC < or = 4 mcg/mL requiresadditional testing.)For uncomplicated UTI caused by E. coli,K. pneumoniae or P. mirabilis: Cefazolin issusceptible if MIC <32 mcg/mL and predictssusceptible to the oral agents cefaclor, cefdinir,cefpodoxime, cefprozil, cefuroxime, cephalexinand loracarbef.Legend:S = Susceptible  I = IntermediateR = Resistant  NS = Not susceptible* = Not tested  NR = Not reported**NN = See antimicrobic comments  SARS Coronavirus 2 [facility] order, Performed in [facility] hospital lab) Nasopharyngeal Nasopharyngeal Swab     Status: Abnormal   Collection Time: 07/29/19  1:44 PM   Specimen: Nasopharyngeal Swab  Result Value Ref Range Status   SARS Coronavirus 2 POSITIVE (A) NEGATIVE Final    Comment: RESULT CALLED TO, READ BACK BY AND VERIFIED WITH: BRIANNA CHAPMAN 07/29/19 1453 KLW (NOTE) If result is NEGATIVE SARS-CoV-2 target nucleic acids are NOT DETECTED. The SARS-CoV-2 RNA is generally detectable in upper and lower  respiratory specimens during the acute phase of infection. The lowest  concentration of SARS-CoV-2 viral copies this assay can detect is 250  copies / mL. A negative result does not preclude SARS-CoV-2 infection  and should not be used as the sole basis for treatment or other  patient management decisions.  A negative result may occur with  improper specimen collection / handling, submission of specimen other  than nasopharyngeal swab, presence of viral mutation(s) within the  areas targeted by this assay, and inadequate number of viral copies  (<250 copies / mL). A negative result must be combined with clinical  observations, patient history, and epidemiological information. If result is POSITIVE SARS-CoV-2 target nucleic acids are DETECTED. The  SARS-CoV-2 RNA is generally detectable in upper and lower  respiratory specimens during the acute phase of infection.  Positive  results are  indicative of active infection with SARS-CoV-2.  Clinical  correlation with patient history and other diagnostic information is  necessary to determine patient infection status.  Positive results do  not rule out bacterial infection or co-infection with other viruses.  If result is PRESUMPTIVE POSTIVE SARS-CoV-2 nucleic acids MAY BE PRESENT.   A presumptive positive result was obtained on the submitted specimen  and confirmed on repeat testing.  While 2019 novel coronavirus  (SARS-CoV-2) nucleic acids may be present in the submitted sample  additional confirmatory testing may be necessary for epidemiological  and / or clinical management purposes  to differentiate between  SARS-CoV-2 and other Sarbecovirus currently known to infect humans.  If clinically indicated additional testing with an alternate test  methodology [phone]) is a dvised. The SARS-CoV-2 RNA is generally  detectable in upper and lower respiratory specimens during the acute  phase of infection. The expected result is Negative. Fact Sheet for Patients:  StrictlyIdeas.no Fact Sheet for Healthcare Providers: BankingDealers.co.za This test is not yet approved or cleared by the [place] FDA and has been authorized for detection and/or diagnosis of SARS-CoV-2 by FDA under an Emergency Use Authorization (EUA).  This EUA will remain in effect (meaning this test can be used) for the duration of the COVID-19 declaration under Section 564(b)(1) of the Act, 21 U.S.C. section 360bbb-3(b)(1), unless the authorization is terminated or revoked sooner. Performed at [facility], [address] [place], [place] [address]      Medications:   . aspirin EC  81 mg Oral Daily  . Chlorhexidine Gluconate Cloth  6 each Topical Daily  . dexamethasone (DECADRON) injection  6 mg Intravenous Q12H  . enoxaparin (LOVENOX) injection  40 mg Subcutaneous Q24H  . feeding supplement  (ENSURE ENLIVE)  237 mL Oral TID BM  . influenza vaccine adjuvanted  0.5 mL Intramuscular Tomorrow-1000  . mouth rinse  15 mL Mouth Rinse BID  . multivitamin with minerals  1 tablet Oral Daily  . sodium chloride flush  10-40 mL Intracatheter Q12H  . vitamin C  500 mg Oral BID  . zinc sulfate  220 mg Oral Daily   Continuous Infusions: . sodium chloride    . cefTRIAXone (ROCEPHIN)  IV Stopped (08/01/19 2100)  . remdesivir 100 mg in NS 250 mL        LOS: 4 days   Charlynne Cousins  [facility]  08/02/2019, 8:33 AM

## 2019-08-02 NOTE — Progress Notes (Signed)
   08/02/19 0423  Provider Notification  Provider Name/Title R. Shanon Brow MD  Date Provider Notified 08/02/19  Time Provider Notified 0423  Notification Type Page  Notification Reason Change in status  Response No new orders   Heart rate dipping into the low 40s and 50s.  VS remain stable.  Patient easy to arouse, appropriate responses, and no complaints of dizziness or chest pain.  MD texted and made aware.  No new orders received.  Will continue to monitor patient.  Earleen Reaper RN

## 2019-08-02 NOTE — Progress Notes (Signed)
Spoke to daughter x2 today to update.

## 2019-08-03 LAB — COMPREHENSIVE METABOLIC PANEL
ALT: 25 U/L (ref 0–44)
AST: 33 U/L (ref 15–41)
Albumin: 2.6 g/dL — ABNORMAL LOW (ref 3.5–5.0)
Alkaline Phosphatase: 39 U/L (ref 38–126)
Anion gap: 13 (ref 5–15)
BUN: 36 mg/dL — ABNORMAL HIGH (ref 8–23)
CO2: 21 mmol/L — ABNORMAL LOW (ref 22–32)
Calcium: 9 mg/dL (ref 8.9–10.3)
Chloride: 107 mmol/L (ref 98–111)
Creatinine, Ser: 0.66 mg/dL (ref 0.44–1.00)
GFR calc Af Amer: >60 mL/min (ref >60)
GFR calc non Af Amer: >60 mL/min (ref >60)
Glucose, Bld: 130 mg/dL — ABNORMAL HIGH (ref 70–99)
Potassium: 4.4 mmol/L (ref 3.5–5.1)
Sodium: 141 mmol/L (ref 135–145)
Total Bilirubin: 0.2 mg/dL — ABNORMAL LOW (ref 0.3–1.2)
Total Protein: 6 g/dL — ABNORMAL LOW (ref 6.5–8.1)

## 2019-08-03 LAB — LACTATE DEHYDROGENASE: LDH: 272 U/L — ABNORMAL HIGH (ref 98–192)

## 2019-08-03 LAB — D-DIMER, QUANTITATIVE: D-Dimer, Quant: 0.9 ug/mL-FEU — ABNORMAL HIGH (ref 0.00–0.50)

## 2019-08-03 LAB — C-REACTIVE PROTEIN: CRP: 1.2 mg/dL — ABNORMAL HIGH (ref <1.0)

## 2019-08-03 LAB — FERRITIN: Ferritin: 612 ng/mL — ABNORMAL HIGH (ref 11–307)

## 2019-08-03 NOTE — Progress Notes (Signed)
  Speech Language Pathology Treatment: Dysphagia  Patient Details Name: Regina Harris MRN: [id number] DOB: 1935-12-18 Today's Date: 08/03/2019 Time: 7353-2992 SLP Time Calculation (min) (ACUTE ONLY): 13 min  Assessment / Plan / Recommendation Clinical Impression  Pt had immediate coughing noted only with bites of magic cup straight from the freezer. Question if this could be related to the cold temperature in light of her baseline esophageal issues, as SLP then transitioned to a room temperature puree and nectar thick liquids with no overt s/s of aspiration noted. Pt needed frequent rest breaks to consume small amounts of boluses. She looks fatigued overall and needs time to catch her breath, although VS remain stable. Advanced trials were held. Pt was left in an upright position with delayed coughing noted as SLP was exiting - again, question esophageal component given her history. Would encourage an upright position upon completion of PO intake for at least 30 minutes.   HPI HPI: Pt is an 83 yo female admitted with UTI and progressive weakness, found to have COVID-19. PMH:  HTN, DM-2, HLD, GERD, esophagitis, HH, Barrett's esophagus      SLP Plan  Continue with current plan of care       Recommendations  Diet recommendations: Dysphagia 1 (puree);Nectar-thick liquid Liquids provided via: Cup;Straw Medication Administration: Crushed with puree Supervision: Staff to assist with self feeding;Full supervision/cueing for compensatory strategies Compensations: Minimize environmental distractions;Slow rate;Small sips/bites Postural Changes and/or Swallow Maneuvers: Seated upright 90 degrees;Upright 30-60 min after meal                Oral Care Recommendations: Oral care BID Follow up Recommendations: Skilled Nursing facility SLP Visit Diagnosis: Dysphagia, unspecified (R13.10) Plan: Continue with current plan of care       GO                Venita Sheffield Talyn Eddie 08/03/2019, 3:30  PM  Pollyann Glen, M.A. [facility] Acute Environmental education officer [phone] Office [phone]

## 2019-08-03 NOTE — Plan of Care (Signed)
  Problem: Health Behavior/Discharge Planning: Goal: Ability to manage health-related needs will improve 08/03/2019 2220 by Zeb Comfort, RN Outcome: Progressing 08/03/2019 2220 by Zeb Comfort, RN Outcome: Progressing   Problem: Clinical Measurements: Goal: Ability to maintain clinical measurements within normal limits will improve 08/03/2019 2220 by Zeb Comfort, RN Outcome: Progressing 08/03/2019 2220 by Zeb Comfort, RN Outcome: Progressing Goal: Will remain free from infection 08/03/2019 2220 by Zeb Comfort, RN Outcome: Progressing 08/03/2019 2220 by Zeb Comfort, RN Outcome: Progressing Goal: Diagnostic test results will improve 08/03/2019 2220 by Zeb Comfort, RN Outcome: Progressing 08/03/2019 2220 by Zeb Comfort, RN Outcome: Progressing Goal: Cardiovascular complication will be avoided 08/03/2019 2220 by Zeb Comfort, RN Outcome: Progressing 08/03/2019 2220 by Zeb Comfort, RN Outcome: Progressing   Problem: Activity: Goal: Risk for activity intolerance will decrease 08/03/2019 2220 by Zeb Comfort, RN Outcome: Progressing 08/03/2019 2220 by Zeb Comfort, RN Outcome: Progressing   Problem: Nutrition: Goal: Adequate nutrition will be maintained 08/03/2019 2220 by Zeb Comfort, RN Outcome: Progressing 08/03/2019 2220 by Zeb Comfort, RN Outcome: Progressing   Problem: Coping: Goal: Level of anxiety will decrease 08/03/2019 2220 by Zeb Comfort, RN Outcome: Progressing 08/03/2019 2220 by Zeb Comfort, RN Outcome: Progressing   Problem: Elimination: Goal: Will not experience complications related to bowel motility 08/03/2019 2220 by Zeb Comfort, RN Outcome: Progressing 08/03/2019 2220 by Zeb Comfort, RN Outcome: Progressing Goal: Will not experience complications related to urinary retention 08/03/2019 2220 by Zeb Comfort, RN Outcome: Progressing 08/03/2019 2220 by Zeb Comfort, RN Outcome: Progressing   Problem: Pain Managment: Goal: General experience of comfort  will improve 08/03/2019 2220 by Zeb Comfort, RN Outcome: Progressing 08/03/2019 2220 by Zeb Comfort, RN Outcome: Progressing   Problem: Safety: Goal: Ability to remain free from injury will improve 08/03/2019 2220 by Zeb Comfort, RN Outcome: Progressing 08/03/2019 2220 by Zeb Comfort, RN Outcome: Progressing   Problem: Skin Integrity: Goal: Risk for impaired skin integrity will decrease 08/03/2019 2220 by Zeb Comfort, RN Outcome: Progressing 08/03/2019 2220 by Zeb Comfort, RN Outcome: Progressing

## 2019-08-03 NOTE — Plan of Care (Signed)
Will continue with plan of care. 

## 2019-08-03 NOTE — Progress Notes (Signed)
[facility] PROGRESS NOTE    Progress Note  [name]  OHY:[id number] DOB: November 20, 1935 DOA: 07/29/2019 PCP: Abner Greenspan, MD     Brief Narrative:   Regina Harris is an 83 y.o. female past medical history significant for hypertension, diabetes mellitus type 2 comes in for 6-day of a UTI and progressive weakness.  Not eating or drinking well.  There is some cough but no shortness of breath she was started on Bactrim with no improvement.  Assessment/Plan:   Acute metabolic encephalopathy multifactorial possibly due to UTI and COVID-19 infection: Urine culture grew E. coli pansensitive she was placed on IV Rocephin which she completed a 5-day course and her encephalopathy resolved. See below for further details on COVID-19.  Essential hypertension: Continue current regimen blood pressure is stable.  Pneumonia due to COVID-19 virus: Chest x-ray showed a new infiltrate her encephalopathy was slowly was slow to improve, she was started empirically on IV Remdesivir and IV steroids. Continue to trend inflammatory markers.  DVT prophylaxis: lovenox Family Communication:none Disposition Plan/Barrier to D/C: Once she finishes her IV Remdesivir treatment. Code Status:     Code Status Orders  (From admission, onward)         Start     Ordered   07/29/19 2028  Full code  Continuous     07/29/19 2028        Code Status History    This patient has a current code status but no historical code status.   Advance Care Planning Activity    Advance Directive Documentation     Most Recent Value  Type of Advance Directive  Living will  Pre-existing out of facility DNR order (yellow form or pink MOST form)  -  "MOST" Form in Place?  -        IV Access:    Peripheral IV   Procedures and diagnostic studies:   Dg Chest Port 1 View  Result Date: 08/01/2019 CLINICAL DATA:  Cough.  Covid positive. EXAM: PORTABLE CHEST 1 VIEW COMPARISON:  07/29/2019 FINDINGS: 0958 hours.  Lungs are mildly hyperexpanded. The peripheral interstitial and airspace opacity seen in the left mid lung today appears progressive in the interval. Progressive airspace disease at the left base obscures the hemidiaphragm. Similar appearance of interstitial and airspace opacity at the right base. Prominent skin fold over the right upper lung. The cardiopericardial silhouette is within normal limits for size. The visualized bony structures of the thorax are intact. IMPRESSION: Interval progression of interstitial and airspace disease in the left mid lung and left base. Similar appearance of right basilar airspace opacity Electronically Signed   By: Misty Stanley M.D.   On: 08/01/2019 11:51   [place] Ekg Site Rite  Result Date: 08/01/2019 If Site Rite image not attached, placement could not be confirmed due to current cardiac rhythm.    Medical Consultants:    None.  Anti-Infectives:   Rocephin  Subjective:    [name] relates her breathing is better compared to yesterday.  Objective:    Vitals:   08/02/19 1600 08/02/19 1942 08/03/19 0455 08/03/19 0500  BP: (!) 150/67 131/61 (!) 149/61   Pulse: 62 (!) 59 (!) 53   Resp:  18 16   Temp:  97.8 F (36.6 C) 97.9 F (36.6 C)   TempSrc:  Oral Oral   SpO2: 95% 99% 99%   Weight:    76.6 kg  Height:       SpO2: 99 %  Intake/Output Summary (Last 24 hours) at 08/03/2019 0805 Last data filed at 08/02/2019 2152 Gross per 24 hour  Intake 490 ml  Output -  Net 490 ml   Filed Weights   07/31/19 0421 08/01/19 0419 08/03/19 0500  Weight: 77.9 kg 79.2 kg 76.6 kg    Exam: General exam: In no acute distress. Respiratory system: Good air movement and diffuse crackles bilaterally. Cardiovascular system: S1 & S2 heard, RRR. No JVD. Gastrointestinal system: Abdomen is nondistended, soft and nontender.  Central nervous system: Alert and oriented. No focal neurological deficits. Extremities: No pedal edema. Skin: No rashes, lesions or  ulcers Psychiatry: Judgement and insight appear normal. Mood & affect appropriate.   Data Reviewed:    Labs: Basic Metabolic Panel: Recent Labs  Lab 07/29/19 1123 07/29/19 2055 07/30/19 0135 08/01/19 0351 08/02/19 0515  NA 137  --  136 138 140  K 4.4  --  4.5 3.6 3.8  CL 100  --  100 106 107  CO2 25  --  24 21* 25  GLUCOSE 107*  --  89 90 145*  BUN 19  --  22 29* 30*  CREATININE 1.05* 1.05* 0.92 0.82 0.62  CALCIUM 8.8*  --  8.7* 8.5* 8.8*   GFR Estimated Creatinine Clearance: 58.9 mL/min (by C-G formula based on SCr of 0.62 mg/dL). Liver Function Tests: Recent Labs  Lab 08/01/19 0351 08/02/19 0515  AST 30 37  ALT 15 22  ALKPHOS 40 43  BILITOT 0.4 0.7  PROT 6.0* 6.2*  ALBUMIN 2.7* 2.7*   No results for input(s): LIPASE, AMYLASE in the last 168 hours. No results for input(s): AMMONIA in the last 168 hours. Coagulation profile No results for input(s): INR, PROTIME in the last 168 hours. COVID-19 Labs  Recent Labs    08/01/19 0351 08/02/19 0515  DDIMER 1.15* 1.22*  FERRITIN 787* 677*  LDH 316* 277*  CRP 2.4* 2.6*    Lab Results  Component Value Date   SARSCOV2NAA POSITIVE (A) 07/29/2019    CBC: Recent Labs  Lab 07/29/19 1123 07/29/19 2055 07/30/19 0135  WBC 2.7* 2.6* 2.9*  HGB 16.8* 16.0* 16.6*  HCT 50.8* 48.7* 50.7*  MCV 91.5 92.6 93.2  PLT 92* 87* 93*   Cardiac Enzymes: No results for input(s): CKTOTAL, CKMB, CKMBINDEX, TROPONINI in the last 168 hours. BNP (last 3 results) No results for input(s): PROBNP in the last 8760 hours. CBG: No results for input(s): GLUCAP in the last 168 hours. D-Dimer: Recent Labs    08/01/19 0351 08/02/19 0515  DDIMER 1.15* 1.22*   Hgb A1c: No results for input(s): HGBA1C in the last 72 hours. Lipid Profile: No results for input(s): CHOL, HDL, LDLCALC, TRIG, CHOLHDL, LDLDIRECT in the last 72 hours. Thyroid function studies: No results for input(s): TSH, T4TOTAL, T3FREE, THYROIDAB in the last 72 hours.   Invalid input(s): FREET3 Anemia work up: Recent Labs    08/01/19 0351 08/02/19 0515  FERRITIN 787* 677*   Sepsis Labs: Recent Labs  Lab 07/29/19 1123 07/29/19 2055 07/30/19 0135  PROCALCITON  --  <0.10  --   WBC 2.7* 2.6* 2.9*   Microbiology Recent Results (from the past 240 hour(s))  Urine Culture     Status: Abnormal   Collection Time: 07/27/19 12:38 PM   Specimen: Urine  Result Value Ref Range Status   MICRO NUMBER: [id number]  Final   SPECIMEN QUALITY: Adequate  Final   Sample Source NOT GIVEN  Final   STATUS: FINAL  Final  ISOLATE 1: Escherichia coli (A)  Final    Comment: Greater than 100,000 CFU/mL of Escherichia coli      Susceptibility   Escherichia coli - URINE CULTURE, REFLEX    AMOX/CLAVULANIC <=2 Sensitive     AMPICILLIN <=2 Sensitive     AMPICILLIN/SULBACTAM <=2 Sensitive     CEFAZOLIN* <=4 Not Reportable      * For infections other than uncomplicated UTIcaused by E. coli, K. pneumoniae or P. mirabilis:Cefazolin is resistant if MIC > or = 8 mcg/mL.(Distinguishing susceptible versus intermediatefor isolates with MIC < or = 4 mcg/mL requiresadditional testing.)For uncomplicated UTI caused by E. coli,K. pneumoniae or P. mirabilis: Cefazolin issusceptible if MIC <32 mcg/mL and predictssusceptible to the oral agents cefaclor, cefdinir,cefpodoxime, cefprozil, cefuroxime, cephalexinand loracarbef.    CEFEPIME <=1 Sensitive     CEFTRIAXONE <=1 Sensitive     CIPROFLOXACIN <=0.25 Sensitive     LEVOFLOXACIN <=0.12 Sensitive     ERTAPENEM <=0.5 Sensitive     GENTAMICIN <=1 Sensitive     IMIPENEM <=0.25 Sensitive     NITROFURANTOIN <=16 Sensitive     PIP/TAZO <=4 Sensitive     TOBRAMYCIN <=1 Sensitive     TRIMETH/SULFA* <=20 Sensitive      * For infections other than uncomplicated UTIcaused by E. coli, K. pneumoniae or P. mirabilis:Cefazolin is resistant if MIC > or = 8 mcg/mL.(Distinguishing susceptible versus intermediatefor isolates with MIC < or = 4 mcg/mL  requiresadditional testing.)For uncomplicated UTI caused by E. coli,K. pneumoniae or P. mirabilis: Cefazolin issusceptible if MIC <32 mcg/mL and predictssusceptible to the oral agents cefaclor, cefdinir,cefpodoxime, cefprozil, cefuroxime, cephalexinand loracarbef.Legend:S = Susceptible  I = IntermediateR = Resistant  NS = Not susceptible* = Not tested  NR = Not reported**NN = See antimicrobic comments  SARS Coronavirus 2 [facility] order, Performed in [facility] hospital lab) Nasopharyngeal Nasopharyngeal Swab     Status: Abnormal   Collection Time: 07/29/19  1:44 PM   Specimen: Nasopharyngeal Swab  Result Value Ref Range Status   SARS Coronavirus 2 POSITIVE (A) NEGATIVE Final    Comment: RESULT CALLED TO, READ BACK BY AND VERIFIED WITH: BRIANNA CHAPMAN 07/29/19 1453 KLW (NOTE) If result is NEGATIVE SARS-CoV-2 target nucleic acids are NOT DETECTED. The SARS-CoV-2 RNA is generally detectable in upper and lower  respiratory specimens during the acute phase of infection. The lowest  concentration of SARS-CoV-2 viral copies this assay can detect is 250  copies / mL. A negative result does not preclude SARS-CoV-2 infection  and should not be used as the sole basis for treatment or other  patient management decisions.  A negative result may occur with  improper specimen collection / handling, submission of specimen other  than nasopharyngeal swab, presence of viral mutation(s) within the  areas targeted by this assay, and inadequate number of viral copies  (<250 copies / mL). A negative result must be combined with clinical  observations, patient history, and epidemiological information. If result is POSITIVE SARS-CoV-2 target nucleic acids are DETECTED. The  SARS-CoV-2 RNA is generally detectable in upper and lower  respiratory specimens during the acute phase of infection.  Positive  results are indicative of active infection with SARS-CoV-2.  Clinical  correlation with patient history and  other diagnostic information is  necessary to determine patient infection status.  Positive results do  not rule out bacterial infection or co-infection with other viruses. If result is PRESUMPTIVE POSTIVE SARS-CoV-2 nucleic acids MAY BE PRESENT.   A presumptive positive result was obtained on the  submitted specimen  and confirmed on repeat testing.  While 2019 novel coronavirus  (SARS-CoV-2) nucleic acids may be present in the submitted sample  additional confirmatory testing may be necessary for epidemiological  and / or clinical management purposes  to differentiate between  SARS-CoV-2 and other Sarbecovirus currently known to infect humans.  If clinically indicated additional testing with an alternate test  methodology [phone]) is a dvised. The SARS-CoV-2 RNA is generally  detectable in upper and lower respiratory specimens during the acute  phase of infection. The expected result is Negative. Fact Sheet for Patients:  StrictlyIdeas.no Fact Sheet for Healthcare Providers: BankingDealers.co.za This test is not yet approved or cleared by the [place] FDA and has been authorized for detection and/or diagnosis of SARS-CoV-2 by FDA under an Emergency Use Authorization (EUA).  This EUA will remain in effect (meaning this test can be used) for the duration of the COVID-19 declaration under Section 564(b)(1) of the Act, 21 U.S.C. section 360bbb-3(b)(1), unless the authorization is terminated or revoked sooner. Performed at [facility], [address] [place], [place] [address]      Medications:   . aspirin EC  81 mg Oral Daily  . Chlorhexidine Gluconate Cloth  6 each Topical Daily  . dexamethasone (DECADRON) injection  6 mg Intravenous Q12H  . enoxaparin (LOVENOX) injection  40 mg Subcutaneous Q24H  . feeding supplement (ENSURE ENLIVE)  237 mL Oral TID BM  . influenza vaccine adjuvanted  0.5 mL Intramuscular  Tomorrow-1000  . mouth rinse  15 mL Mouth Rinse BID  . multivitamin with minerals  1 tablet Oral Daily  . sodium chloride flush  10-40 mL Intracatheter Q12H  . vitamin C  500 mg Oral BID  . zinc sulfate  220 mg Oral Daily   Continuous Infusions: . sodium chloride    . cefTRIAXone (ROCEPHIN)  IV Stopped (08/02/19 2100)  . remdesivir 100 mg in NS 250 mL 100 mg (08/02/19 0851)      LOS: 5 days   Charlynne Cousins  [facility]  08/03/2019, 8:05 AM

## 2019-08-03 NOTE — Progress Notes (Signed)
Occupational Therapy Evaluation Patient Details Name: Regina Harris MRN: [id number] DOB: April 23, 1936 Today's Date: 08/03/2019    History of Present Illness 83 y/o female w/ hx of HTN. DM II, Barrett esophagus, gastritis, HH, GERD, R RC repair, admitted with UTI and progressive weakness   Clinical Impression   Grand daughter called during session for PLOF as pt unable to give information. Apparently pt lived alone PTA and family assisted with IADL and ADL tasks as needed. Grand daughter states that the pt was getting "weaker" and was having more difficulty with medication management prior to contracting Covid. Pt had recently started taking "Namenda" - most likely has baseline dementia per conversation with family. Pt seen on RA with desat to 91 (finger probe) and required Min A for mobility using RW and mod A for LB ADL due to deficits listed below. Pt appears more confused than baseline status, although began to have more appropriate conversation once up in chair and Face timing her family. Pt would benefit from rehab at SNF, however [place] daughter states they would like for her to DC home if they can arrange 24/7 assistance. Will follow acutely    Follow Up Recommendations  Home health OT;Supervision/Assistance - 24 hour;SNF;Other (comment)(Family prefers HH but will need 24/7 assistance)    Equipment Recommendations  3 in 1 bedside commode    Recommendations for Other Services PT consult     Precautions / Restrictions Precautions Precautions: Fall      Mobility Bed Mobility Overal bed mobility: Needs Assistance Bed Mobility: Supine to Sit     Supine to sit: Min assist        Transfers Overall transfer level: Needs assistance Equipment used: Rolling walker (2 wheeled) Transfers: Sit to/from [facility] Sit to Stand: Min assist Stand pivot transfers: Min assist            Balance     Sitting balance-Leahy Scale: Fair       Standing balance-Leahy  Scale: Poor                             ADL either performed or assessed with clinical judgement   ADL Overall ADL's : Needs assistance/impaired Eating/Feeding: Minimal assistance Eating/Feeding Details (indicate cue type and reason): Poor PO intake; nsg states she fed her; nectar thick liquids Grooming: Minimal assistance;Sitting   Upper Body Bathing: Minimal assistance;Sitting   Lower Body Bathing: Moderate assistance;Sit to/from stand   Upper Body Dressing : Minimal assistance;Sitting   Lower Body Dressing: Moderate assistance;Sit to/from stand   Toilet Transfer: Minimal assistance;BSC;Ambulation;RW Toilet Transfer Details (indicate cue type and reason): Able to take several steps to the toilet Toileting- Clothing Manipulation and Hygiene: Minimal assistance;Sit to/from stand Toileting - Clothing Manipulation Details (indicate cue type and reason): Pt able to clean herself but required VC to hand the soiled paper to the therapist rather than holding it like a tissue     Functional mobility during ADLs: Minimal assistance;Rolling walker;Cueing for safety General ADL Comments: Unsafe decent; high fall risk     Vision         Perception     Praxis      Pertinent Vitals/Pain Pain Assessment: Faces Faces Pain Scale: No hurt     Hand Dominance Right   Extremity/Trunk Assessment Upper Extremity Assessment Upper Extremity Assessment: Generalized weakness   Lower Extremity Assessment Lower Extremity Assessment: Defer to PT evaluation   Cervical / Trunk Assessment Cervical /  Trunk Assessment: Kyphotic   Communication Communication Communication: HOH   Cognition Arousal/Alertness: Awake/alert Behavior During Therapy: Flat affect Overall Cognitive Status: Impaired/Different from baseline Area of Impairment: Orientation;Attention;Memory;Following commands;Safety/judgement;Awareness;Problem solving                 Orientation Level: Disoriented  to;Place;Time("1945"; July"; here for "coronavirus") Current Attention Level: Sustained Memory: Decreased recall of precautions;Decreased short-term memory Following Commands: Follows one step commands with increased time Safety/Judgement: Decreased awareness of deficits;Decreased awareness of safety Awareness: Intellectual Problem Solving: Slow processing;Decreased initiation;Difficulty sequencing;Requires verbal cues;Requires tactile cues General Comments: Decrease in cognitive status. Grand daughter states that she had noticed her grand mother having increased difficulty with medication management and woudl repeat herself very often; had recently started taking "Numenda"   General Comments       Exercises     Shoulder Instructions      Home Living Family/patient expects to be discharged to:: Private residence Living Arrangements: Alone Available Help at Discharge: Family;Available PRN/intermittently(working on 24/7) Type of Home: House                           Additional Comments: Spoke to [place] daughter over the phone. Trying to arrange for [place] mother to go home. will need home set up information      Prior Functioning/Environment Level of Independence: Needs assistance  Gait / Transfers Assistance Needed: Did not use an AD ADL's / Homemaking Assistance Needed: Family assisted as needed with self care and meals            OT Problem List: Decreased strength;Decreased activity tolerance;Impaired balance (sitting and/or standing);Decreased cognition;Decreased safety awareness;Decreased knowledge of use of DME or AE;Cardiopulmonary status limiting activity      OT Treatment/Interventions: Self-care/ADL training;Therapeutic exercise;Neuromuscular education;Energy conservation;DME and/or AE instruction;Therapeutic activities;Cognitive remediation/compensation;Patient/family education;Balance training    OT Goals(Current goals can be found in the care plan  section) Acute Rehab OT Goals Patient Stated Goal: per family to get stronger OT Goal Formulation: With patient/family Time For Goal Achievement: 08/17/19 Potential to Achieve Goals: Good  OT Frequency: Min 3X/week   Barriers to D/C:            Co-evaluation              AM-PAC OT "6 Clicks" Daily Activity     Outcome Measure Help from another person eating meals?: A Little Help from another person taking care of personal grooming?: A Little Help from another person toileting, which includes using toliet, bedpan, or urinal?: A Little Help from another person bathing (including washing, rinsing, drying)?: A Lot Help from another person to put on and taking off regular upper body clothing?: A Little Help from another person to put on and taking off regular lower body clothing?: A Lot 6 Click Score: 16   End of Session Equipment Utilized During Treatment: Gait belt;Rolling walker Nurse Communication: Mobility status  Activity Tolerance: Patient tolerated treatment well Patient left: in chair;with call bell/phone within reach;with chair alarm set  OT Visit Diagnosis: Other abnormalities of gait and mobility (R26.89);Muscle weakness (generalized) (M62.81);Other symptoms and signs involving cognitive function                Time: 1110-1202 OT Time Calculation (min): 52 min Charges:  OT General Charges $OT Visit: 1 Visit OT Evaluation $OT Eval Moderate Complexity: 1 Mod OT Treatments $Self Care/Home Management : 23-37 mins  Maurie Boettcher, OT/L   Acute OT Clinical Specialist Acute Rehabilitation  Services Pager [phone] Office [phone]   [facility] 08/03/2019, 2:06 PM

## 2019-08-04 ENCOUNTER — Telehealth: Payer: Self-pay | Admitting: Family Medicine

## 2019-08-04 DIAGNOSIS — E7800 Pure hypercholesterolemia, unspecified: Secondary | ICD-10-CM

## 2019-08-04 DIAGNOSIS — E538 Deficiency of other specified B group vitamins: Secondary | ICD-10-CM

## 2019-08-04 DIAGNOSIS — E890 Postprocedural hypothyroidism: Secondary | ICD-10-CM

## 2019-08-04 DIAGNOSIS — I10 Essential (primary) hypertension: Secondary | ICD-10-CM

## 2019-08-04 DIAGNOSIS — R7303 Prediabetes: Secondary | ICD-10-CM

## 2019-08-04 LAB — COMPREHENSIVE METABOLIC PANEL
ALT: 44 U/L (ref 0–44)
AST: 46 U/L — ABNORMAL HIGH (ref 15–41)
Albumin: 2.8 g/dL — ABNORMAL LOW (ref 3.5–5.0)
Alkaline Phosphatase: 42 U/L (ref 38–126)
Anion gap: 9 (ref 5–15)
BUN: 39 mg/dL — ABNORMAL HIGH (ref 8–23)
CO2: 27 mmol/L (ref 22–32)
Calcium: 8.8 mg/dL — ABNORMAL LOW (ref 8.9–10.3)
Chloride: 106 mmol/L (ref 98–111)
Creatinine, Ser: 0.57 mg/dL (ref 0.44–1.00)
GFR calc Af Amer: >60 mL/min (ref >60)
GFR calc non Af Amer: >60 mL/min (ref >60)
Glucose, Bld: 153 mg/dL — ABNORMAL HIGH (ref 70–99)
Potassium: 4.1 mmol/L (ref 3.5–5.1)
Sodium: 142 mmol/L (ref 135–145)
Total Bilirubin: 0.4 mg/dL (ref 0.3–1.2)
Total Protein: 6.1 g/dL — ABNORMAL LOW (ref 6.5–8.1)

## 2019-08-04 LAB — LACTATE DEHYDROGENASE: LDH: 272 U/L — ABNORMAL HIGH (ref 98–192)

## 2019-08-04 LAB — FERRITIN: Ferritin: 452 ng/mL — ABNORMAL HIGH (ref 11–307)

## 2019-08-04 LAB — D-DIMER, QUANTITATIVE: D-Dimer, Quant: 0.77 ug/mL-FEU — ABNORMAL HIGH (ref 0.00–0.50)

## 2019-08-04 LAB — C-REACTIVE PROTEIN: CRP: 0.8 mg/dL (ref <1.0)

## 2019-08-04 MED ORDER — GUAIFENESIN-DM 100-10 MG/5ML PO SYRP
5.0000 mL | ORAL_SOLUTION | ORAL | Status: DC | PRN
  Administered 2019-08-04 – 2019-08-05 (×3): 5 mL via ORAL
  Filled 2019-08-04 (×3): qty 10

## 2019-08-04 NOTE — TOC Initial Note (Signed)
Transition of Care [facility]) - Initial/Assessment Note    Patient Details  Name: Regina Harris MRN: [id number] Date of Birth: 1935/12/12  Transition of Care [facility]) CM/SW Contact:    Benard Halsted, LCSW Phone Number: 08/04/2019, 5:51 PM  Clinical Narrative:                 CSW spoke with patient's granddaughter and daughter on the phone regarding discharge plan for patient. They reported being wary of SNF placement and are hoping to take patient home with home health services. CSW described home health services and explained that any extra services would be an out of pocket cost. They reported understanding. RNCM to assist in answering home health questions.   Expected Discharge Plan: [place] Barriers to Discharge: Continued Medical Work up   Patient Goals and CMS Choice Patient states their goals for this hospitalization and ongoing recovery are:: Return home CMS Medicare.gov Compare Post Acute Care list provided to:: Patient Represenative (must comment)(Daughter and granddaughter) Choice offered to / list presented to : Adult Children  Expected Discharge Plan and Services Expected Discharge Plan: [place] In-house Referral: Clinical Social Work Discharge Planning Services: CM Consult Post Acute Care Choice: [place] arrangements for the past 2 months: [place]                                      Prior Living Arrangements/Services Living arrangements for the past 2 months: Single Family Home Lives with:: Adult Children Patient language and need for interpreter reviewed:: Yes Do you feel safe going back to the place where you live?: Yes      Need for Family Participation in Patient Care: Yes (Comment) Care giver support system in place?: Yes (comment) Current home services: DME Criminal Activity/Legal Involvement Pertinent to Current Situation/Hospitalization: No - Comment as needed  Activities of Daily  Living Home Assistive Devices/Equipment: None ADL Screening (condition at time of admission) Patient's cognitive ability adequate to safely complete daily activities?: No Is the patient deaf or have difficulty hearing?: No Does the patient have difficulty seeing, even when wearing glasses/contacts?: No Does the patient have difficulty concentrating, remembering, or making decisions?: Yes Patient able to express need for assistance with ADLs?: Yes Does the patient have difficulty dressing or bathing?: Yes Independently performs ADLs?: No Communication: Needs assistance Is this a change from baseline?: Pre-admission baseline Dressing (OT): Needs assistance Is this a change from baseline?: Pre-admission baseline Grooming: Needs assistance Is this a change from baseline?: Pre-admission baseline Feeding: Needs assistance Is this a change from baseline?: Pre-admission baseline Bathing: Needs assistance Is this a change from baseline?: Pre-admission baseline Toileting: Needs assistance Is this a change from baseline?: Pre-admission baseline In/Out Bed: Needs assistance Is this a change from baseline?: Pre-admission baseline Walks in Home: Needs assistance Is this a change from baseline?: Change from baseline, expected to last <3 days Does the patient have difficulty walking or climbing stairs?: Yes Weakness of Legs: None Weakness of Arms/Hands: None  Permission Sought/Granted Permission sought to share information with : Facility Sport and exercise psychologist, Family Supports Permission granted to share information with : No  Share Information with NAME: [name]/Carolyn  Permission granted to share info w AGENCY: [place] granted to share info w Relationship: Granddaughter/Daughter  Permission granted to share info w Contact Information: [phone]  Emotional Assessment Appearance:: Appears stated age  Attitude/Demeanor/Rapport: Unable to Assess Affect (typically observed):  Unable to Assess Orientation: : Oriented to Place, Oriented to Self, Oriented to Situation Alcohol / Substance Use: Not Applicable Psych Involvement: No (comment)  Admission diagnosis:  covid 19 Patient Active Problem List   Diagnosis Date Noted  . Acute metabolic encephalopathy 62/86/3817  . Acute lower UTI 07/29/2019  . Pneumonia due to COVID-19 virus 07/29/2019  . COVID-19 virus infection 07/29/2019  . UTI (urinary tract infection) 07/27/2019  . Nasal congestion 07/27/2019  . B12 deficiency 01/05/2019  . Memory loss 12/30/2018  . Poor balance 12/30/2018  . Low back pain 10/14/2018  . Fatigue 10/01/2018  . Abnormal urinalysis 10/01/2018  . 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 Right leg swelling 06/09/2018  . Right knee pain 06/09/2018  . Baker's cyst of knee, right 06/09/2018  . Encounter for screening mammogram for breast cancer 02/27/2017  . Estrogen deficiency 02/27/2017  . Foot pain, bilateral 02/27/2017  . Hypothyroidism 04/16/2016  . Pedal edema 04/05/2016  . Right rotator cuff tear 03/09/2016  . Hypokalemia 03/01/2016  . Nodule of chest wall 10/06/2014  . Left breast mass 06/30/2014  . Encounter for Medicare annual wellness exam 02/22/2014  . Polycythemia, secondary 02/22/2014  . GERD (gastroesophageal reflux disease) 08/24/2013  . Lump or mass in breast 05/06/2013  . Post-menopausal 06/24/2012  . Obesity 12/24/2011  . Prediabetes 03/12/2011  . Hyperglycemia 03/12/2011  . NEOPLASM UNSPECIFIED NATURE DIGESTIVE SYSTEM 07/24/2010  . LUNG NODULE 01/16/2010  . HYPERCHOLESTEROLEMIA, PURE 07/10/2007  . Smoker 03/19/2007  . Essential hypertension 03/19/2007  . Carotid bruit 03/19/2007  . COLONIC POLYPS, HX OF 03/19/2007   PCP:  Abner Greenspan, MD Pharmacy:   [facility], [place] [address] Phone: [phone] Fax: [phone]     Social Determinants of Health (SDOH) Interventions     Readmission Risk Interventions No flowsheet data found.

## 2019-08-04 NOTE — Progress Notes (Addendum)
Updated grand daughter [name].  Facetime pt with [name].  Facetime pt with daughter.

## 2019-08-04 NOTE — Progress Notes (Signed)
Physical Therapy Treatment Patient Details Name: Regina Harris MRN: [id number] DOB: Nov 15, 1935 Today's Date: 08/04/2019    History of Present Illness 83 y/o female w/ hx of HTN. DM II, Barrett esophagus, gastritis, HH, GERD, R RC repair, admitted with UTI and progressive weakness    PT Comments    Pt more alert today, was able to complete some standing activities with therapist and some seated there ex. Needs continued cues to refocus to task and also to complete tasks. Remained on room air and sats in 90s throughout.    Follow Up Recommendations  SNF     Equipment Recommendations  Other (comment)    Recommendations for Other Services       Precautions / Restrictions Precautions Precautions: Fall Restrictions Weight Bearing Restrictions: No    Mobility  Bed Mobility               General bed mobility comments: received sitting in recliner  Transfers Overall transfer level: Needs assistance Equipment used: Rolling walker (2 wheeled) Transfers: Sit to/from Stand Sit to Stand: Min assist            Ambulation/Gait                 Stairs             Wheelchair Mobility    Modified Rankin (Stroke Patients Only)       Balance Overall balance assessment: Needs assistance Sitting-balance support: Feet supported;Bilateral upper extremity supported Sitting balance-Leahy Scale: Fair   Postural control: Posterior lean Standing balance support: Bilateral upper extremity supported Standing balance-Leahy Scale: Poor Standing balance comment: stands approx 4-5 mins today with min a, RW and max cues                            Cognition Arousal/Alertness: Lethargic Behavior During Therapy: Flat affect Overall Cognitive Status: Impaired/Different from baseline Area of Impairment: Orientation;Attention;Memory;Following commands;Safety/judgement;Awareness;Problem solving                 Orientation Level: Disoriented  to;Place;Time Current Attention Level: Alternating Memory: Decreased recall of precautions;Decreased short-term memory Following Commands: Follows one step commands inconsistently;Follows one step commands with increased time Safety/Judgement: Decreased awareness of deficits;Decreased awareness of safety   Problem Solving: Slow processing;Decreased initiation;Difficulty sequencing;Requires verbal cues;Requires tactile cues        Exercises General Exercises - Lower Extremity Long Arc Quad: 10 reps Straight Leg Raises: 10 reps    General Comments General comments (skin integrity, edema, etc.): Still lethargic will open eyes some but go back to eyes closed, was able to minimally have pt feed herself a few spoons (staff report she has not been eating) was able to complete sit<>stand and also standing tasks with pt today, but did not ambulate. Pt is on room air and managed to keep 02 sats in 90s, Nelcore pedi probe on L earlobe used.      Pertinent Vitals/Pain Pain Assessment: No/denies pain Pain Score: 0-No pain    Home Living                      Prior Function            PT Goals (current goals can now be found in the care plan section) Acute Rehab PT Goals Patient Stated Goal: per family to get stronger Time For Goal Achievement: 08/14/19 Potential to Achieve Goals: Fair Progress towards PT goals: Progressing toward goals  Frequency    Min 2X/week      PT Plan Current plan remains appropriate    Co-evaluation              AM-PAC PT "6 Clicks" Mobility   Outcome Measure  Help needed turning from your back to your side while in a flat bed without using bedrails?: A Lot Help needed moving from lying on your back to sitting on the side of a flat bed without using bedrails?: A Lot Help needed moving to and from a bed to a chair (including a wheelchair)?: A Lot Help needed standing up from a chair using your arms (e.g., wheelchair or bedside chair)?: A  Little Help needed to walk in hospital room?: A Lot Help needed climbing 3-5 steps with a railing? : A Lot 6 Click Score: 13    End of Session   Activity Tolerance: Patient limited by lethargy;Treatment limited secondary to medical complications (Comment) Patient left: in chair;with call bell/phone within reach;with chair alarm set Nurse Communication: Mobility status;Precautions PT Visit Diagnosis: Other abnormalities of gait and mobility (R26.89);Muscle weakness (generalized) (M62.81)     Time: 9969-2493 PT Time Calculation (min) (ACUTE ONLY): 33 min  Charges:  $Therapeutic Exercise: 8-22 mins $Therapeutic Activity: 8-22 mins                     Horald Chestnut, PT    Delford Field 08/04/2019, 5:15 PM

## 2019-08-04 NOTE — Progress Notes (Signed)
[facility] PROGRESS NOTE    Progress Note  Regina Harris  YQM:[id number] DOB: [birth year] DOA: 07/29/2019 PCP: Abner Greenspan, MD     Brief Narrative:   Regina Harris is an 83 y.o. female past medical history significant for hypertension, diabetes mellitus type 2 comes in for 6-day of a UTI and progressive weakness.  Not eating or drinking well.  There is some cough but no shortness of breath she was started on Bactrim with no improvement.  Assessment/Plan:   Acute metabolic encephalopathy multifactorial possibly due to UTI and COVID-19 infection: She failed outpatient Bactrim, her culture grew E. coli pansensitive she completed her course of IV Rocephin. See below for further details on COVID-19.  Pneumonia due to COVID-19 virus: She was not hypoxic on admission, but encephalopathic with a chest x-ray showing bilateral infiltrates, she was started on IV Remdesivir and steroids her mentation started slowly to improve.l Her inflammatory markers continue to improve daily.  We will start checking them.   We have consulted physical therapy she will probably need skilled nursing facility placement.  Essential hypertension: Continue current regimen blood pressure is stable.  DVT prophylaxis: lovenox Family Communication:none Disposition Plan/Barrier to D/C: Once she finishes her IV Remdesivir treatment. Code Status:     Code Status Orders  (From admission, onward)         Start     Ordered   07/29/19 2028  Full code  Continuous     07/29/19 2028        Code Status History    This patient has a current code status but no historical code status.   Advance Care Planning Activity    Advance Directive Documentation     Most Recent Value  Type of Advance Directive  Living will  Pre-existing out of facility DNR order (yellow form or pink MOST form)  -  "MOST" Form in Place?  -        IV Access:    Peripheral IV   Procedures and diagnostic studies:   No  results found.   Medical Consultants:    None.  Anti-Infectives:   Rocephin  Subjective:    [name] no new complaints today.  Objective:    Vitals:   08/03/19 1700 08/03/19 2030 08/04/19 0331 08/04/19 0829  BP: (!) 147/76 (!) 149/108 129/60 (!) 132/55  Pulse: 60 62  70  Resp: 18 18 18 19   Temp: 97.7 F (36.5 C) 97.6 F (36.4 C) 97.6 F (36.4 C) 98.5 F (36.9 C)  TempSrc: Oral Oral Oral Oral  SpO2: 98% 92% 93% 95%  Weight:   77.2 kg   Height:       SpO2: 95 %   Intake/Output Summary (Last 24 hours) at 08/04/2019 0837 Last data filed at 08/04/2019 0300 Gross per 24 hour  Intake 200 ml  Output 350 ml  Net -150 ml   Filed Weights   08/01/19 0419 08/03/19 0500 08/04/19 0331  Weight: 79.2 kg 76.6 kg 77.2 kg    Exam: General exam: In no acute distress. Respiratory system: Good air movement and diffuse crackles bilaterally more prominent on the right. Cardiovascular system: S1 & S2 heard, RRR. No JVD. Gastrointestinal system: Abdomen is nondistended, soft and nontender.  Central nervous system: Alert and oriented. No focal neurological deficits. Extremities: No pedal edema. Skin: No rashes, lesions or ulcers  Data Reviewed:    Labs: Basic Metabolic Panel: Recent Labs  Lab 07/30/19 0135 08/01/19 0351 08/02/19 0515 08/03/19  0600 08/04/19 0450  NA 136 138 140 141 142  K 4.5 3.6 3.8 4.4 4.1  CL 100 106 107 107 106  CO2 24 21* 25 21* 27  GLUCOSE 89 90 145* 130* 153*  BUN 22 29* 30* 36* 39*  CREATININE 0.92 0.82 0.62 0.66 0.57  CALCIUM 8.7* 8.5* 8.8* 9.0 8.8*   GFR Estimated Creatinine Clearance: 59 mL/min (by C-G formula based on SCr of 0.57 mg/dL). Liver Function Tests: Recent Labs  Lab 08/01/19 0351 08/02/19 0515 08/03/19 0600 08/04/19 0450  AST 30 37 33 46*  ALT 15 22 25  44  ALKPHOS 40 43 39 42  BILITOT 0.4 0.7 0.2* 0.4  PROT 6.0* 6.2* 6.0* 6.1*  ALBUMIN 2.7* 2.7* 2.6* 2.8*   No results for input(s): LIPASE, AMYLASE in the last  168 hours. No results for input(s): AMMONIA in the last 168 hours. Coagulation profile No results for input(s): INR, PROTIME in the last 168 hours. COVID-19 Labs  Recent Labs    08/02/19 0515 08/03/19 0600 08/04/19 0450  DDIMER 1.22* 0.90* 0.77*  FERRITIN 677* 612* 452*  LDH 277* 272* 272*  CRP 2.6* 1.2* 0.8    Lab Results  Component Value Date   SARSCOV2NAA POSITIVE (A) 07/29/2019    CBC: Recent Labs  Lab 07/29/19 1123 07/29/19 2055 07/30/19 0135  WBC 2.7* 2.6* 2.9*  HGB 16.8* 16.0* 16.6*  HCT 50.8* 48.7* 50.7*  MCV 91.5 92.6 93.2  PLT 92* 87* 93*   Cardiac Enzymes: No results for input(s): CKTOTAL, CKMB, CKMBINDEX, TROPONINI in the last 168 hours. BNP (last 3 results) No results for input(s): PROBNP in the last 8760 hours. CBG: No results for input(s): GLUCAP in the last 168 hours. D-Dimer: Recent Labs    08/03/19 0600 08/04/19 0450  DDIMER 0.90* 0.77*   Hgb A1c: No results for input(s): HGBA1C in the last 72 hours. Lipid Profile: No results for input(s): CHOL, HDL, LDLCALC, TRIG, CHOLHDL, LDLDIRECT in the last 72 hours. Thyroid function studies: No results for input(s): TSH, T4TOTAL, T3FREE, THYROIDAB in the last 72 hours.  Invalid input(s): FREET3 Anemia work up: Recent Labs    08/03/19 0600 08/04/19 0450  FERRITIN 612* 452*   Sepsis Labs: Recent Labs  Lab 07/29/19 1123 07/29/19 2055 07/30/19 0135  PROCALCITON  --  <0.10  --   WBC 2.7* 2.6* 2.9*   Microbiology Recent Results (from the past 240 hour(s))  Urine Culture     Status: Abnormal   Collection Time: 07/27/19 12:38 PM   Specimen: Urine  Result Value Ref Range Status   MICRO NUMBER: [id number]  Final   SPECIMEN QUALITY: Adequate  Final   Sample Source NOT GIVEN  Final   STATUS: FINAL  Final   ISOLATE 1: Escherichia coli (A)  Final    Comment: Greater than 100,000 CFU/mL of Escherichia coli      Susceptibility   Escherichia coli - URINE CULTURE, REFLEX    AMOX/CLAVULANIC <=2  Sensitive     AMPICILLIN <=2 Sensitive     AMPICILLIN/SULBACTAM <=2 Sensitive     CEFAZOLIN* <=4 Not Reportable      * For infections other than uncomplicated UTIcaused by E. coli, K. pneumoniae or P. mirabilis:Cefazolin is resistant if MIC > or = 8 mcg/mL.(Distinguishing susceptible versus intermediatefor isolates with MIC < or = 4 mcg/mL requiresadditional testing.)For uncomplicated UTI caused by E. coli,K. pneumoniae or P. mirabilis: Cefazolin issusceptible if MIC <32 mcg/mL and predictssusceptible to the oral agents cefaclor, cefdinir,cefpodoxime, cefprozil, cefuroxime, cephalexinand loracarbef.  CEFEPIME <=1 Sensitive     CEFTRIAXONE <=1 Sensitive     CIPROFLOXACIN <=0.25 Sensitive     LEVOFLOXACIN <=0.12 Sensitive     ERTAPENEM <=0.5 Sensitive     GENTAMICIN <=1 Sensitive     IMIPENEM <=0.25 Sensitive     NITROFURANTOIN <=16 Sensitive     PIP/TAZO <=4 Sensitive     TOBRAMYCIN <=1 Sensitive     TRIMETH/SULFA* <=20 Sensitive      * For infections other than uncomplicated UTIcaused by E. coli, K. pneumoniae or P. mirabilis:Cefazolin is resistant if MIC > or = 8 mcg/mL.(Distinguishing susceptible versus intermediatefor isolates with MIC < or = 4 mcg/mL requiresadditional testing.)For uncomplicated UTI caused by E. coli,K. pneumoniae or P. mirabilis: Cefazolin issusceptible if MIC <32 mcg/mL and predictssusceptible to the oral agents cefaclor, cefdinir,cefpodoxime, cefprozil, cefuroxime, cephalexinand loracarbef.Legend:S = Susceptible  I = IntermediateR = Resistant  NS = Not susceptible* = Not tested  NR = Not reported**NN = See antimicrobic comments  SARS Coronavirus 2 [facility] order, Performed in [facility] hospital lab) Nasopharyngeal Nasopharyngeal Swab     Status: Abnormal   Collection Time: 07/29/19  1:44 PM   Specimen: Nasopharyngeal Swab  Result Value Ref Range Status   SARS Coronavirus 2 POSITIVE (A) NEGATIVE Final    Comment: RESULT CALLED TO, READ BACK BY AND VERIFIED WITH:  BRIANNA CHAPMAN 07/29/19 1453 KLW (NOTE) If result is NEGATIVE SARS-CoV-2 target nucleic acids are NOT DETECTED. The SARS-CoV-2 RNA is generally detectable in upper and lower  respiratory specimens during the acute phase of infection. The lowest  concentration of SARS-CoV-2 viral copies this assay can detect is 250  copies / mL. A negative result does not preclude SARS-CoV-2 infection  and should not be used as the sole basis for treatment or other  patient management decisions.  A negative result may occur with  improper specimen collection / handling, submission of specimen other  than nasopharyngeal swab, presence of viral mutation(s) within the  areas targeted by this assay, and inadequate number of viral copies  (<250 copies / mL). A negative result must be combined with clinical  observations, patient history, and epidemiological information. If result is POSITIVE SARS-CoV-2 target nucleic acids are DETECTED. The  SARS-CoV-2 RNA is generally detectable in upper and lower  respiratory specimens during the acute phase of infection.  Positive  results are indicative of active infection with SARS-CoV-2.  Clinical  correlation with patient history and other diagnostic information is  necessary to determine patient infection status.  Positive results do  not rule out bacterial infection or co-infection with other viruses. If result is PRESUMPTIVE POSTIVE SARS-CoV-2 nucleic acids MAY BE PRESENT.   A presumptive positive result was obtained on the submitted specimen  and confirmed on repeat testing.  While 2019 novel coronavirus  (SARS-CoV-2) nucleic acids may be present in the submitted sample  additional confirmatory testing may be necessary for epidemiological  and / or clinical management purposes  to differentiate between  SARS-CoV-2 and other Sarbecovirus currently known to infect humans.  If clinically indicated additional testing with an alternate test  methodology [phone])  is a dvised. The SARS-CoV-2 RNA is generally  detectable in upper and lower respiratory specimens during the acute  phase of infection. The expected result is Negative. Fact Sheet for Patients:  StrictlyIdeas.no Fact Sheet for Healthcare Providers: BankingDealers.co.za This test is not yet approved or cleared by the [place] FDA and has been authorized for detection and/or diagnosis of SARS-CoV-2 by FDA under  an Emergency Use Authorization (EUA).  This EUA will remain in effect (meaning this test can be used) for the duration of the COVID-19 declaration under Section 564(b)(1) of the Act, 21 U.S.C. section 360bbb-3(b)(1), unless the authorization is terminated or revoked sooner. Performed at [facility], [address] [place], [place] [address]      Medications:   . aspirin EC  81 mg Oral Daily  . Chlorhexidine Gluconate Cloth  6 each Topical Daily  . dexamethasone (DECADRON) injection  6 mg Intravenous Q12H  . enoxaparin (LOVENOX) injection  40 mg Subcutaneous Q24H  . feeding supplement (ENSURE ENLIVE)  237 mL Oral TID BM  . influenza vaccine adjuvanted  0.5 mL Intramuscular Tomorrow-1000  . mouth rinse  15 mL Mouth Rinse BID  . multivitamin with minerals  1 tablet Oral Daily  . sodium chloride flush  10-40 mL Intracatheter Q12H  . vitamin C  500 mg Oral BID  . zinc sulfate  220 mg Oral Daily   Continuous Infusions: . sodium chloride    . cefTRIAXone (ROCEPHIN)  IV Stopped (08/03/19 2120)  . remdesivir 100 mg in NS 250 mL Stopped (08/03/19 1000)      LOS: 6 days   Charlynne Cousins  [facility]  08/04/2019, 8:37 AM

## 2019-08-04 NOTE — Progress Notes (Signed)
Nutrition Follow-up RD working remotely.  DOCUMENTATION CODES:   Not applicable  INTERVENTION:    Recommend placing Cortrak tube for enteral nutrition support. Osmolite 1.2 at 65 ml/h with Pro-stat 30 ml once daily would provide 1972 kcal, 102 gm protein, 1279 ml free water daily.   Continue Hormel Shake daily with Breakfast which provides 520 kcals and 22 g of protein and Magic cup BID with lunch and dinner, each supplement provides 290 kcal and 9 grams of protein, automatically on meal trays to optimize nutritional intake.    Continue to offer Ensure Enlive po TID, each supplement provides 350 kcal and 20 grams of protein.   Continue MVI daily.  NUTRITION DIAGNOSIS:   Increased nutrient needs related to acute illness(COVID) as evidenced by estimated needs.  Ongoing  GOAL:   Patient will meet greater than or equal to 90% of their needs   Unmet  MONITOR:   PO intake, Supplement acceptance  ASSESSMENT:   83 yo female admitted with UTI, progressive weakness, COVID-19 PNA. PMH includes HTN, DM-2, HLD, GERD, esophagitis, HH, Barrett's esophagus.   Patient has been consuming on average ~25% of meals. She was not eating or drinking well PTA. Patient with 10% weight loss within the past 7 months per review of weight encounters. Patient is at increased nutrition risk, suspect malnutrition. Unable to obtain enough information at this time for identification of malnutrition.   Labs reviewed.  Medications reviewed and include decadron, MVI, vitamin C, zinc sulfate.   Weight is 2.5 kg below admission weight. I/O +2.1 L  NUTRITION - FOCUSED PHYSICAL EXAM:  deferred  Diet Order:   Diet Order            DIET - DYS 1 Room service appropriate? Yes; Fluid consistency: Nectar Thick  Diet effective now              EDUCATION NEEDS:   Not appropriate for education at this time  Skin:  Skin Assessment: Reviewed RN Assessment(MASD to L breast)  Last BM:  9/26  Height:    Ht Readings from Last 1 Encounters:  07/29/19 5\' 7"  (1.702 m)    Weight:   Wt Readings from Last 1 Encounters:  08/04/19 77.2 kg   9/23 admission weight 79.7 kg  Ideal Body Weight:  61.4 kg  BMI:  Body mass index is 26.66 kg/m.  Estimated Nutritional Needs:   Kcal:  1800-2000  Protein:  95-110 gm  Fluid:  >/= 1.8 L    Molli Barrows, RD, LDN, [place] Pager [phone] After Hours Pager [phone]

## 2019-08-04 NOTE — Plan of Care (Signed)
Will continue with plan of care. 

## 2019-08-04 NOTE — Telephone Encounter (Signed)
-----   Message from Ellamae Sia sent at 07/28/2019  3:52 PM EDT ----- Regarding: Lab orders for Wednesday, 9.30.20 Patient is scheduled for CPX labs, please order future labs, Thanks , Karna Christmas

## 2019-08-04 NOTE — Care Management (Signed)
Case manager left message for granddaughter to return call, concerning her desire to take patient home vs SNF.   Ricki Miller, RN BSN Case Manager  [phone]

## 2019-08-05 ENCOUNTER — Other Ambulatory Visit: Payer: Medicare Other

## 2019-08-05 ENCOUNTER — Ambulatory Visit: Payer: Medicare Other

## 2019-08-05 LAB — C-REACTIVE PROTEIN: CRP: <0.8 mg/dL (ref <1.0)

## 2019-08-05 LAB — COMPREHENSIVE METABOLIC PANEL
ALT: 50 U/L — ABNORMAL HIGH (ref 0–44)
AST: 35 U/L (ref 15–41)
Albumin: 2.8 g/dL — ABNORMAL LOW (ref 3.5–5.0)
Alkaline Phosphatase: 43 U/L (ref 38–126)
Anion gap: 7 (ref 5–15)
BUN: 44 mg/dL — ABNORMAL HIGH (ref 8–23)
CO2: 28 mmol/L (ref 22–32)
Calcium: 8.7 mg/dL — ABNORMAL LOW (ref 8.9–10.3)
Chloride: 107 mmol/L (ref 98–111)
Creatinine, Ser: 0.71 mg/dL (ref 0.44–1.00)
GFR calc Af Amer: >60 mL/min (ref >60)
GFR calc non Af Amer: >60 mL/min (ref >60)
Glucose, Bld: 161 mg/dL — ABNORMAL HIGH (ref 70–99)
Potassium: 4.2 mmol/L (ref 3.5–5.1)
Sodium: 142 mmol/L (ref 135–145)
Total Bilirubin: 0.5 mg/dL (ref 0.3–1.2)
Total Protein: 6 g/dL — ABNORMAL LOW (ref 6.5–8.1)

## 2019-08-05 LAB — D-DIMER, QUANTITATIVE: D-Dimer, Quant: 0.66 ug/mL-FEU — ABNORMAL HIGH (ref 0.00–0.50)

## 2019-08-05 LAB — FERRITIN: Ferritin: 373 ng/mL — ABNORMAL HIGH (ref 11–307)

## 2019-08-05 LAB — LACTATE DEHYDROGENASE: LDH: 279 U/L — ABNORMAL HIGH (ref 98–192)

## 2019-08-05 MED ORDER — DONEPEZIL HCL 10 MG PO TABS
5.0000 mg | ORAL_TABLET | Freq: Every day | ORAL | Status: DC
  Administered 2019-08-05: 22:00:00 5 mg via ORAL
  Filled 2019-08-05: qty 1

## 2019-08-05 MED ORDER — LEVOTHYROXINE SODIUM 75 MCG PO TABS
75.0000 ug | ORAL_TABLET | Freq: Every day | ORAL | Status: DC
  Administered 2019-08-06: 75 ug via ORAL
  Filled 2019-08-05: qty 1

## 2019-08-05 MED ORDER — FUROSEMIDE 20 MG PO TABS
20.0000 mg | ORAL_TABLET | Freq: Every day | ORAL | Status: DC
  Administered 2019-08-05 – 2019-08-06 (×2): 20 mg via ORAL
  Filled 2019-08-05 (×2): qty 1

## 2019-08-05 MED ORDER — ATORVASTATIN CALCIUM 10 MG PO TABS
10.0000 mg | ORAL_TABLET | Freq: Every day | ORAL | Status: DC
  Administered 2019-08-05 – 2019-08-06 (×2): 10 mg via ORAL
  Filled 2019-08-05 (×2): qty 1

## 2019-08-05 MED ORDER — ASPIRIN EC 81 MG PO TBEC: 81.0000 mg | DELAYED_RELEASE_TABLET | Freq: Every day | ORAL | Status: DC

## 2019-08-05 MED ORDER — PANTOPRAZOLE SODIUM 40 MG PO TBEC
40.0000 mg | DELAYED_RELEASE_TABLET | Freq: Every day | ORAL | Status: DC
  Administered 2019-08-05 – 2019-08-06 (×2): 40 mg via ORAL
  Filled 2019-08-05 (×2): qty 1

## 2019-08-05 MED ORDER — GABAPENTIN 300 MG PO CAPS
400.0000 mg | ORAL_CAPSULE | Freq: Two times a day (BID) | ORAL | Status: DC
  Administered 2019-08-05 – 2019-08-06 (×2): 400 mg via ORAL
  Filled 2019-08-05 (×2): qty 1

## 2019-08-05 MED ORDER — HYDROCHLOROTHIAZIDE 25 MG PO TABS
25.0000 mg | ORAL_TABLET | Freq: Every day | ORAL | Status: DC
  Administered 2019-08-05 – 2019-08-06 (×2): 25 mg via ORAL
  Filled 2019-08-05 (×2): qty 1

## 2019-08-05 NOTE — Progress Notes (Signed)
  Speech Language Pathology Treatment: Dysphagia  Patient Details Name: Regina Harris MRN: [id number] DOB: Mar 05, 1936 Today's Date: 08/05/2019 Time: 1430-1440 SLP Time Calculation (min) (ACUTE ONLY): 10 min  Assessment / Plan / Recommendation Clinical Impression  Session was kept brief this afternoon as pt was fatigued. She was eager to drink water, trying to drink at a quick rate and even resisting as SLP attempted to remove the straw from her mouth. Although she does not consistently cough, there is some immediate coughing after drinking. This is mostly noted after she resumed thin liquid intake after having a few bites of puree. Question the correlation to her esophageal hx versus fatigue as the session continued. Pt declined additional boluses, so SLP was not able to investigate further. More solid boluses were held as pt said she felt too tired from boluses of puree. Question if she may be ready soon for even a water protocol, but would not make any changes today given her level of fatigue. Will maintain Dys 1 diet and nectar thick liquids for now.   HPI HPI: Pt is an 83 yo female admitted with UTI and progressive weakness, found to have COVID-19. PMH:  HTN, DM-2, HLD, GERD, esophagitis, HH, Barrett's esophagus      SLP Plan  Continue with current plan of care       Recommendations  Diet recommendations: Dysphagia 1 (puree);Nectar-thick liquid Liquids provided via: Cup;Straw Medication Administration: Crushed with puree Supervision: Staff to assist with self feeding;Full supervision/cueing for compensatory strategies Compensations: Minimize environmental distractions;Slow rate;Small sips/bites Postural Changes and/or Swallow Maneuvers: Seated upright 90 degrees;Upright 30-60 min after meal                Oral Care Recommendations: Oral care BID Follow up Recommendations: Skilled Nursing facility SLP Visit Diagnosis: Dysphagia, unspecified (R13.10) Plan: Continue with current  plan of care       GO                Venita Sheffield Nilah Belcourt 08/05/2019, 3:56 PM  Pollyann Glen, M.A. [place] Acute Environmental education officer [phone] Office [phone]

## 2019-08-05 NOTE — Progress Notes (Signed)
PROGRESS NOTE  Regina Harris QIO:[id number] DOB: [birth year] DOA: 07/29/2019 PCP: Abner Greenspan, MD   LOS: 7 days   Brief Narrative / Interim history: 83 year old female with hypertension, type 2 diabetes mellitus, came into the hospital and is admitted on 07/29/2019 due to poor appetite, progressive weakness, diagnosed with a UTI and also tested positive for COVID-19.  She was found to be more confused and encephalopathic on admission  Subjective / 24h Interval events: No overnight events, she is doing well this morning.  Denies any shortness of breath.  No abdominal pain, no nausea or vomiting  Assessment & Plan: Principal Problem:   Acute metabolic encephalopathy Active Problems:   Essential hypertension   Acute lower UTI   Pneumonia due to COVID-19 virus   COVID-19 virus infection   Principal Problem Covid-19 Viral Illness /viral pneumonia -She did have bilateral infiltrates on the chest x-ray and completed Remdesivir, also started on steroids -Inflammatory markers improving, respiratory status is stable   COVID-19 Labs  Recent Labs    08/03/19 0600 08/04/19 0450 08/05/19 0415 08/05/19 0430  DDIMER 0.90* 0.77* 0.66*  --   FERRITIN 612* 452*  --  373*  LDH 272* 272* 279*  --   CRP 1.2* 0.8  --  <0.8    Lab Results  Component Value Date   SARSCOV2NAA POSITIVE (A) 07/29/2019   Active Problems Acute metabolic encephalopathy -Multifactorial due to UTI as well as Covid -Mental status appears improved this morning, it is noted that she is however on Aricept.  Resume Aricept today  Hypothyroidism -Resume Synthroid today  Hypertension -Hold home medications  Hyperlipidemia -Resume Lipitor today  UTI -No culture data, completed 5 days of ceftriaxone  Scheduled Meds:  aspirin EC  81 mg Oral Daily   Chlorhexidine Gluconate Cloth  6 each Topical Daily   dexamethasone (DECADRON) injection  6 mg Intravenous Q12H   enoxaparin (LOVENOX) injection  40 mg  Subcutaneous Q24H   feeding supplement (ENSURE ENLIVE)  237 mL Oral TID BM   influenza vaccine adjuvanted  0.5 mL Intramuscular Tomorrow-1000   mouth rinse  15 mL Mouth Rinse BID   multivitamin with minerals  1 tablet Oral Daily   sodium chloride flush  10-40 mL Intracatheter Q12H   vitamin C  500 mg Oral BID   zinc sulfate  220 mg Oral Daily   Continuous Infusions:  sodium chloride     PRN Meds:.sodium chloride, acetaminophen **OR** acetaminophen, guaiFENesin-dextromethorphan, ondansetron **OR** ondansetron (ZOFRAN) IV, Resource ThickenUp Clear, sodium chloride flush  DVT prophylaxis: Lovenox Code Status: Full code Family Communication: will call granddaughter  Disposition Plan: requires SNF  Consultants:  None   Procedures:  None   Microbiology: None   Antimicrobials: None    Objective: Vitals:   08/04/19 1643 08/04/19 1940 08/05/19 0355 08/05/19 0358  BP: 120/63 (!) 132/59 (!) 150/87   Pulse: 61 61 71   Resp: 18  17   Temp: 98 F (36.7 C) 97.7 F (36.5 C) 98.1 F (36.7 C)   TempSrc: Oral Axillary Oral   SpO2: 96% 97% 97%   Weight:    77.5 kg  Height:        Intake/Output Summary (Last 24 hours) at 08/05/2019 1217 Last data filed at 08/05/2019 0630 Gross per 24 hour  Intake 1201.15 ml  Output --  Net 1201.15 ml   Filed Weights   08/03/19 0500 08/04/19 0331 08/05/19 0358  Weight: 76.6 kg 77.2 kg 77.5 kg    Examination:  Constitutional: NAD Eyes: PERRL, lids and conjunctivae normal ENMT: Mucous membranes are moist.  Respiratory: clear to auscultation bilaterally, no wheezing, no crackles. Normal respiratory effort.  Cardiovascular: Regular rate and rhythm, no murmurs / rubs / gallops. No LE edema. 2+ pedal pulses. No carotid bruits.  Abdomen: no tenderness. Bowel sounds positive.  Musculoskeletal: no clubbing / cyanosis. Skin: no rashes Neurologic: CN 2-12 grossly intact. Strength 5/5 in all 4.    Data Reviewed: I have independently  reviewed following labs and imaging studies   CBC: Recent Labs  Lab 07/29/19 2055 07/30/19 0135  WBC 2.6* 2.9*  HGB 16.0* 16.6*  HCT 48.7* 50.7*  MCV 92.6 93.2  PLT 87* 93*   Basic Metabolic Panel: Recent Labs  Lab 08/01/19 0351 08/02/19 0515 08/03/19 0600 08/04/19 0450 08/05/19 0415  NA 138 140 141 142 142  K 3.6 3.8 4.4 4.1 4.2  CL 106 107 107 106 107  CO2 21* 25 21* 27 28  GLUCOSE 90 145* 130* 153* 161*  BUN 29* 30* 36* 39* 44*  CREATININE 0.82 0.62 0.66 0.57 0.71  CALCIUM 8.5* 8.8* 9.0 8.8* 8.7*   GFR: Estimated Creatinine Clearance: 59.2 mL/min (by C-G formula based on SCr of 0.71 mg/dL). Liver Function Tests: Recent Labs  Lab 08/01/19 0351 08/02/19 0515 08/03/19 0600 08/04/19 0450 08/05/19 0415  AST 30 37 33 46* 35  ALT 15 22 25  44 50*  ALKPHOS 40 43 39 42 43  BILITOT 0.4 0.7 0.2* 0.4 0.5  PROT 6.0* 6.2* 6.0* 6.1* 6.0*  ALBUMIN 2.7* 2.7* 2.6* 2.8* 2.8*   No results for input(s): LIPASE, AMYLASE in the last 168 hours. No results for input(s): AMMONIA in the last 168 hours. Coagulation Profile: No results for input(s): INR, PROTIME in the last 168 hours. Cardiac Enzymes: No results for input(s): CKTOTAL, CKMB, CKMBINDEX, TROPONINI in the last 168 hours. BNP (last 3 results) No results for input(s): PROBNP in the last 8760 hours. HbA1C: No results for input(s): HGBA1C in the last 72 hours. CBG: No results for input(s): GLUCAP in the last 168 hours. Lipid Profile: No results for input(s): CHOL, HDL, LDLCALC, TRIG, CHOLHDL, LDLDIRECT in the last 72 hours. Thyroid Function Tests: No results for input(s): TSH, T4TOTAL, FREET4, T3FREE, THYROIDAB in the last 72 hours. Anemia Panel: Recent Labs    08/04/19 0450 08/05/19 0430  FERRITIN 452* 373*   Urine analysis:    Component Value Date/Time   COLORURINE AMBER (A) 07/29/2019 1344   APPEARANCEUR HAZY (A) 07/29/2019 1344   LABSPEC 1.016 07/29/2019 1344   PHURINE 6.0 07/29/2019 1344   GLUCOSEU  NEGATIVE 07/29/2019 1344   HGBUR SMALL (A) 07/29/2019 1344   BILIRUBINUR NEGATIVE 07/29/2019 1344   BILIRUBINUR negative 07/27/2019 1036   KETONESUR 5 (A) 07/29/2019 1344   PROTEINUR 30 (A) 07/29/2019 1344   UROBILINOGEN 0.2 07/27/2019 1036   NITRITE NEGATIVE 07/29/2019 1344   LEUKOCYTESUR MODERATE (A) 07/29/2019 1344   Sepsis Labs: Invalid input(s): PROCALCITONIN, LACTICIDVEN  Recent Results (from the past 240 hour(s))  Urine Culture     Status: Abnormal   Collection Time: 07/27/19 12:38 PM   Specimen: Urine  Result Value Ref Range Status   MICRO NUMBER: [id number]  Final   SPECIMEN QUALITY: Adequate  Final   Sample Source NOT GIVEN  Final   STATUS: FINAL  Final   ISOLATE 1: Escherichia coli (A)  Final    Comment: Greater than 100,000 CFU/mL of Escherichia coli      Susceptibility   Escherichia  coli - URINE CULTURE, REFLEX    AMOX/CLAVULANIC <=2 Sensitive     AMPICILLIN <=2 Sensitive     AMPICILLIN/SULBACTAM <=2 Sensitive     CEFAZOLIN* <=4 Not Reportable      * For infections other than uncomplicated UTIcaused by E. coli, K. pneumoniae or P. mirabilis:Cefazolin is resistant if MIC > or = 8 mcg/mL.(Distinguishing susceptible versus intermediatefor isolates with MIC < or = 4 mcg/mL requiresadditional testing.)For uncomplicated UTI caused by E. coli,K. pneumoniae or P. mirabilis: Cefazolin issusceptible if MIC <32 mcg/mL and predictssusceptible to the oral agents cefaclor, cefdinir,cefpodoxime, cefprozil, cefuroxime, cephalexinand loracarbef.    CEFEPIME <=1 Sensitive     CEFTRIAXONE <=1 Sensitive     CIPROFLOXACIN <=0.25 Sensitive     LEVOFLOXACIN <=0.12 Sensitive     ERTAPENEM <=0.5 Sensitive     GENTAMICIN <=1 Sensitive     IMIPENEM <=0.25 Sensitive     NITROFURANTOIN <=16 Sensitive     PIP/TAZO <=4 Sensitive     TOBRAMYCIN <=1 Sensitive     TRIMETH/SULFA* <=20 Sensitive      * For infections other than uncomplicated UTIcaused by E. coli, K. pneumoniae or P.  mirabilis:Cefazolin is resistant if MIC > or = 8 mcg/mL.(Distinguishing susceptible versus intermediatefor isolates with MIC < or = 4 mcg/mL requiresadditional testing.)For uncomplicated UTI caused by E. coli,K. pneumoniae or P. mirabilis: Cefazolin issusceptible if MIC <32 mcg/mL and predictssusceptible to the oral agents cefaclor, cefdinir,cefpodoxime, cefprozil, cefuroxime, cephalexinand loracarbef.Legend:S = Susceptible  I = IntermediateR = Resistant  NS = Not susceptible* = Not tested  NR = Not reported**NN = See antimicrobic comments  SARS Coronavirus 2 [facility] order, Performed in [facility] hospital lab) Nasopharyngeal Nasopharyngeal Swab     Status: Abnormal   Collection Time: 07/29/19  1:44 PM   Specimen: Nasopharyngeal Swab  Result Value Ref Range Status   SARS Coronavirus 2 POSITIVE (A) NEGATIVE Final    Comment: RESULT CALLED TO, READ BACK BY AND VERIFIED WITH: BRIANNA CHAPMAN 07/29/19 1453 KLW (NOTE) If result is NEGATIVE SARS-CoV-2 target nucleic acids are NOT DETECTED. The SARS-CoV-2 RNA is generally detectable in upper and lower  respiratory specimens during the acute phase of infection. The lowest  concentration of SARS-CoV-2 viral copies this assay can detect is 250  copies / mL. A negative result does not preclude SARS-CoV-2 infection  and should not be used as the sole basis for treatment or other  patient management decisions.  A negative result may occur with  improper specimen collection / handling, submission of specimen other  than nasopharyngeal swab, presence of viral mutation(s) within the  areas targeted by this assay, and inadequate number of viral copies  (<250 copies / mL). A negative result must be combined with clinical  observations, patient history, and epidemiological information. If result is POSITIVE SARS-CoV-2 target nucleic acids are DETECTED. The  SARS-CoV-2 RNA is generally detectable in upper and lower  respiratory specimens during the acute  phase of infection.  Positive  results are indicative of active infection with SARS-CoV-2.  Clinical  correlation with patient history and other diagnostic information is  necessary to determine patient infection status.  Positive results do  not rule out bacterial infection or co-infection with other viruses. If result is PRESUMPTIVE POSTIVE SARS-CoV-2 nucleic acids MAY BE PRESENT.   A presumptive positive result was obtained on the submitted specimen  and confirmed on repeat testing.  While 2019 novel coronavirus  (SARS-CoV-2) nucleic acids may be present in the submitted sample  additional confirmatory  testing may be necessary for epidemiological  and / or clinical management purposes  to differentiate between  SARS-CoV-2 and other Sarbecovirus currently known to infect humans.  If clinically indicated additional testing with an alternate test  methodology [phone]) is a dvised. The SARS-CoV-2 RNA is generally  detectable in upper and lower respiratory specimens during the acute  phase of infection. The expected result is Negative. Fact Sheet for Patients:  StrictlyIdeas.no Fact Sheet for Healthcare Providers: BankingDealers.co.za This test is not yet approved or cleared by the [place] FDA and has been authorized for detection and/or diagnosis of SARS-CoV-2 by FDA under an Emergency Use Authorization (EUA).  This EUA will remain in effect (meaning this test can be used) for the duration of the COVID-19 declaration under Section 564(b)(1) of the Act, 21 U.S.C. section 360bbb-3(b)(1), unless the authorization is terminated or revoked sooner. Performed at [facility], [address], [place], [place] [address]       Radiology Studies: No results found.   Marzetta Board, MD, PhD [facility]  Contact via  [email or web address]  [facility] [place] P: [phone] F: [phone]

## 2019-08-05 NOTE — Plan of Care (Signed)
Will continue with plan of care. 

## 2019-08-05 NOTE — Care Management (Addendum)
Case manager received call from [employer], patient's granddaughter, they have decided it's in patient's best interest to go to SNF, prefer Bed at [facility]. [facility] has offered a bed, and family has accepted. Patient will likely discharge on tomorrow. Dr. Cruzita Lederer notified of bed availability.

## 2019-08-05 NOTE — Care Management Important Message (Signed)
Important Message  Patient Details  Name: MAKINA SKOW MRN: [id number] Date of Birth: 1936/03/24   Medicare Important Message Given:  Yes - Important Message mailed due to current National Emergency  Verbal consent obtained due to current National Emergency  Relationship to patient: Grandchild Contact Name: Terri Piedra Call Date: 08/05/19  Time: 1255 Phone: [phone] Outcome: Spoke with contact Important Message mailed to: Other (must enter comment)(1st copy received from admissions, declined 2nd copy. Informed that copy is kept on file and available if needed.)   Jacinda Kanady P Rainna Nearhood 08/05/2019, 1:01 PM

## 2019-08-05 NOTE — TOC Progression Note (Signed)
Transition of Care [facility]) - Progression Note    Patient Details  Name: Regina Harris MRN: [id number] Date of Birth: 01/04/1936  [facility] CM/SW Contact  Loletha Grayer Beverely Pace, RN Phone Number:  [phone] (working remotely) 08/05/2019, 12:06 PM  Clinical Narrative:   Case manager spoke with Terri Piedra, patient's granddaughter and her mom to discuss discharge plan. Yesterday they were leaning towards taking patient home with 24/7 assistance, but that is not available due to [place]. Patient would need to be 10 days post quarantine before private care would be able to begin services. Today they agreed to case manager faxing patient out, prefer a bed at [facility]. [name] said final decision will be made after she speaks with bedside RN and MD. Patient is setup with [facility] should they decide that home is best option. Case manager will continue to follow. Initial information has been sent to [facility].      Expected Discharge Plan: [place] Barriers to Discharge: Continued Medical Work up  Expected Discharge Plan and Services Expected Discharge Plan: [place] In-house Referral: Clinical Social Work Discharge Planning Services: CM Consult Post Acute Care Choice: [place] arrangements for the past 2 months: Single Family Home                                       Social Determinants of Health (SDOH) Interventions    Readmission Risk Interventions No flowsheet data found.

## 2019-08-06 ENCOUNTER — Encounter: Payer: Medicare Other | Admitting: Family Medicine

## 2019-08-06 DIAGNOSIS — U071 COVID-19: Secondary | ICD-10-CM | POA: Diagnosis not present

## 2019-08-06 DIAGNOSIS — M6281 Muscle weakness (generalized): Secondary | ICD-10-CM | POA: Diagnosis not present

## 2019-08-06 DIAGNOSIS — R1312 Dysphagia, oropharyngeal phase: Secondary | ICD-10-CM | POA: Diagnosis not present

## 2019-08-06 DIAGNOSIS — J80 Acute respiratory distress syndrome: Secondary | ICD-10-CM | POA: Diagnosis not present

## 2019-08-06 DIAGNOSIS — Z720 Tobacco use: Secondary | ICD-10-CM | POA: Diagnosis not present

## 2019-08-06 DIAGNOSIS — F0280 Dementia in other diseases classified elsewhere without behavioral disturbance: Secondary | ICD-10-CM | POA: Diagnosis not present

## 2019-08-06 DIAGNOSIS — Z5329 Procedure and treatment not carried out because of patient's decision for other reasons: Secondary | ICD-10-CM | POA: Diagnosis not present

## 2019-08-06 DIAGNOSIS — R05 Cough: Secondary | ICD-10-CM | POA: Diagnosis not present

## 2019-08-06 DIAGNOSIS — I10 Essential (primary) hypertension: Secondary | ICD-10-CM | POA: Diagnosis not present

## 2019-08-06 DIAGNOSIS — Z7401 Bed confinement status: Secondary | ICD-10-CM | POA: Diagnosis not present

## 2019-08-06 DIAGNOSIS — G9341 Metabolic encephalopathy: Secondary | ICD-10-CM | POA: Diagnosis not present

## 2019-08-06 DIAGNOSIS — E876 Hypokalemia: Secondary | ICD-10-CM | POA: Diagnosis not present

## 2019-08-06 DIAGNOSIS — I5189 Other ill-defined heart diseases: Secondary | ICD-10-CM | POA: Diagnosis not present

## 2019-08-06 DIAGNOSIS — N3000 Acute cystitis without hematuria: Secondary | ICD-10-CM | POA: Diagnosis not present

## 2019-08-06 DIAGNOSIS — M2550 Pain in unspecified joint: Secondary | ICD-10-CM | POA: Diagnosis not present

## 2019-08-06 DIAGNOSIS — E7849 Other hyperlipidemia: Secondary | ICD-10-CM | POA: Diagnosis not present

## 2019-08-06 DIAGNOSIS — Z7189 Other specified counseling: Secondary | ICD-10-CM | POA: Diagnosis not present

## 2019-08-06 DIAGNOSIS — D751 Secondary polycythemia: Secondary | ICD-10-CM | POA: Diagnosis not present

## 2019-08-06 DIAGNOSIS — R278 Other lack of coordination: Secondary | ICD-10-CM | POA: Diagnosis not present

## 2019-08-06 DIAGNOSIS — E119 Type 2 diabetes mellitus without complications: Secondary | ICD-10-CM | POA: Diagnosis not present

## 2019-08-06 DIAGNOSIS — K219 Gastro-esophageal reflux disease without esophagitis: Secondary | ICD-10-CM | POA: Diagnosis not present

## 2019-08-06 DIAGNOSIS — D6949 Other primary thrombocytopenia: Secondary | ICD-10-CM | POA: Diagnosis not present

## 2019-08-06 DIAGNOSIS — R531 Weakness: Secondary | ICD-10-CM | POA: Diagnosis not present

## 2019-08-06 DIAGNOSIS — E038 Other specified hypothyroidism: Secondary | ICD-10-CM | POA: Diagnosis not present

## 2019-08-06 DIAGNOSIS — E871 Hypo-osmolality and hyponatremia: Secondary | ICD-10-CM | POA: Diagnosis not present

## 2019-08-06 DIAGNOSIS — J189 Pneumonia, unspecified organism: Secondary | ICD-10-CM | POA: Diagnosis not present

## 2019-08-06 DIAGNOSIS — N390 Urinary tract infection, site not specified: Secondary | ICD-10-CM | POA: Diagnosis not present

## 2019-08-06 DIAGNOSIS — J1289 Other viral pneumonia: Secondary | ICD-10-CM | POA: Diagnosis not present

## 2019-08-06 DIAGNOSIS — R41841 Cognitive communication deficit: Secondary | ICD-10-CM | POA: Diagnosis not present

## 2019-08-06 LAB — COMPREHENSIVE METABOLIC PANEL
ALT: 40 U/L (ref 0–44)
AST: 21 U/L (ref 15–41)
Albumin: 2.6 g/dL — ABNORMAL LOW (ref 3.5–5.0)
Alkaline Phosphatase: 43 U/L (ref 38–126)
Anion gap: 7 (ref 5–15)
BUN: 43 mg/dL — ABNORMAL HIGH (ref 8–23)
CO2: 28 mmol/L (ref 22–32)
Calcium: 8.7 mg/dL — ABNORMAL LOW (ref 8.9–10.3)
Chloride: 100 mmol/L (ref 98–111)
Creatinine, Ser: 0.74 mg/dL (ref 0.44–1.00)
GFR calc Af Amer: >60 mL/min (ref >60)
GFR calc non Af Amer: >60 mL/min (ref >60)
Glucose, Bld: 267 mg/dL — ABNORMAL HIGH (ref 70–99)
Potassium: 4.2 mmol/L (ref 3.5–5.1)
Sodium: 135 mmol/L (ref 135–145)
Total Bilirubin: 0.4 mg/dL (ref 0.3–1.2)
Total Protein: 5.5 g/dL — ABNORMAL LOW (ref 6.5–8.1)

## 2019-08-06 LAB — FERRITIN: Ferritin: 295 ng/mL (ref 11–307)

## 2019-08-06 LAB — C-REACTIVE PROTEIN: CRP: <0.8 mg/dL (ref <1.0)

## 2019-08-06 LAB — LACTATE DEHYDROGENASE: LDH: 221 U/L — ABNORMAL HIGH (ref 98–192)

## 2019-08-06 LAB — D-DIMER, QUANTITATIVE: D-Dimer, Quant: 0.55 ug/mL-FEU — ABNORMAL HIGH (ref 0.00–0.50)

## 2019-08-06 MED ORDER — GUAIFENESIN-DM 100-10 MG/5ML PO SYRP: 5.0000 mL | ORAL_SOLUTION | ORAL | 0 refills | Status: DC | PRN

## 2019-08-06 MED ORDER — ASCORBIC ACID 500 MG PO TABS: 500.0000 mg | ORAL_TABLET | Freq: Two times a day (BID) | ORAL | Status: DC

## 2019-08-06 MED ORDER — DEXAMETHASONE 6 MG PO TABS: 6.0000 mg | ORAL_TABLET | Freq: Every day | ORAL | 0 refills | Status: DC

## 2019-08-06 MED ORDER — ZINC SULFATE 220 (50 ZN) MG PO CAPS: 220.0000 mg | ORAL_CAPSULE | Freq: Every day | ORAL | Status: DC

## 2019-08-06 MED ORDER — GLYCERIN (LAXATIVE) 2.1 G RE SUPP
1.0000 | Freq: Once | RECTAL | Status: AC
  Administered 2019-08-06: 1 via RECTAL
  Filled 2019-08-06: qty 1

## 2019-08-06 MED ORDER — DEXAMETHASONE SODIUM PHOSPHATE 10 MG/ML IJ SOLN
6.0000 mg | Freq: Every day | INTRAMUSCULAR | Status: DC
  Administered 2019-08-06: 09:00:00 6 mg via INTRAVENOUS
  Filled 2019-08-06: qty 1

## 2019-08-06 NOTE — Discharge Summary (Signed)
Physician Discharge Summary  Regina Harris FKC:[id number] DOB: 12-01-1935 DOA: 07/29/2019  PCP: Abner Greenspan, MD  Admit date: 07/29/2019 Discharge date: 08/06/2019  Admitted From: home Disposition:  SNF - [facility]  Recommendations for Outpatient Follow-up:  1. Follow up with PCP in 1-2 weeks 2. Continue Decadron for 4 additional days  Home Health: none Equipment/Devices: none  Discharge Condition: stable CODE STATUS: Full code Diet recommendation: dysphagia 1  HPI: Per admitting MD, Regina Harris is a 83 y.o. female with medical history significant of htn, gerd, dm comes in with 6 days of uri and progressive worsening weakness.  Not eating very well and getting more weak.  Coughing but no sob.  Was started on bactrim for uti and not better.  No n/v/d.  Pt referred for admission for covid infection.  Hospital Course: Principal Problem Covid-19 Viral Illness /viral pneumonia -patient was admitted to the hospital with weakness and shortness of breath, chest x-ray on admission showed bilateral infiltrates.  She was placed on Remdesivir and completed a 5-day course, also placed on Decadron and is to complete a 10-day course with 4 additional days on discharge.  She remained stable, on room air, and will be discharged to SNF due to significant weakness post Covid infection  COVID-19 Labs  Recent Labs    08/04/19 0450 08/05/19 0415 08/05/19 0430 08/06/19 0400  DDIMER 0.77* 0.66*  --  0.55*  FERRITIN 452*  --  373* 295  LDH 272* 279*  --  221*  CRP 0.8  --  <0.8 <0.8    Lab Results  Component Value Date   SARSCOV2NAA POSITIVE (A) 07/29/2019    Active Problems Acute metabolic encephalopathy -Multifactorial due to UTI as well as Covid, mental status appears improved this morning, it is noted that she is however on Aricept so suspect a degree of underlying dementia.  Continue Aricept Hypothyroidism -continue Synthroid Hypertension -resume home medications Hyperlipidemia  -continue statin UTI -status post 5 days of ceftriaxone completed while hospitalized  Discharge Diagnoses:  Principal Problem:   Acute metabolic encephalopathy Active Problems:   Essential hypertension   Acute lower UTI   Pneumonia due to COVID-19 virus   COVID-19 virus infection  Discharge Instructions   Allergies as of 08/06/2019   No Known Allergies     Medication List    STOP taking these medications   methocarbamol 500 MG tablet Commonly known as: ROBAXIN   sulfamethoxazole-trimethoprim 800-160 MG tablet Commonly known as: BACTRIM DS     TAKE these medications   ascorbic acid 500 MG tablet Commonly known as: VITAMIN C Take 1 tablet (500 mg total) by mouth 2 (two) times daily.   aspirin EC 81 MG tablet Take 81 mg by mouth daily.   atorvastatin 10 MG tablet Commonly known as: LIPITOR TAKE 1 TABLET BY MOUTH ONCE DAILY   CALCIUM 1000 + D PO Take 1 capsule by mouth daily.   cyanocobalamin 1000 MCG tablet Take 1,000 mcg by mouth daily.   cyanocobalamin 1000 MCG/ML injection Commonly known as: (VITAMIN B-12) Inject 1 mL (1,000 mcg total) into the muscle once a week. For four weeks   dexamethasone 6 MG tablet Commonly known as: DECADRON Take 1 tablet (6 mg total) by mouth daily.   donepezil 5 MG tablet Commonly known as: ARICEPT Take 1 tablet (5 mg total) by mouth at bedtime.   furosemide 20 MG tablet Commonly known as: LASIX TAKE 1 TABLET BY MOUTH ONCE A DAY   gabapentin 400  MG capsule Commonly known as: NEURONTIN TAKE 1 CAPSULE BY MOUTH TWICE DAILY   guaiFENesin-dextromethorphan 100-10 MG/5ML syrup Commonly known as: ROBITUSSIN DM Take 5 mLs by mouth every 4 (four) hours as needed for cough.   hydrochlorothiazide 25 MG tablet Commonly known as: HYDRODIURIL Take 1 tablet (25 mg total) by mouth daily.   levothyroxine 75 MCG tablet Commonly known as: SYNTHROID Take 1 tablet by mouth daily with breakfast.   loratadine 10 MG tablet Commonly  known as: CLARITIN Take 10 mg by mouth daily.   omeprazole 20 MG capsule Commonly known as: PRILOSEC TAKE 1 CAPSULE BY MOUTH TWICE DAILY   potassium chloride SA 20 MEQ tablet Commonly known as: KLOR-CON TAKE 1 TABLET BY MOUTH TWICE (2) DAILY What changed: See the new instructions.   Syringe/Needle (Disp) 25G X 1" 1 ML Misc Use go give B12 injection once a week for 4 weeks   zinc sulfate 220 (50 Zn) MG capsule Take 1 capsule (220 mg total) by mouth daily. Start taking on: August 07, 2019       Consultations:  None   Procedures/Studies:  Ct Head Wo Contrast  Result Date: 07/29/2019 CLINICAL DATA:  Progressive confusion, generalized weakness, incontinence for 3 days. EXAM: CT HEAD WITHOUT CONTRAST TECHNIQUE: Contiguous axial images were obtained from the base of the skull through the vertex without intravenous contrast. COMPARISON:  Report of head CT dated 07/21/2016 FINDINGS: Brain: No evidence of acute infarction, hemorrhage, hydrocephalus, extra-axial collection or mass lesion/mass effect. Minimal age-related atrophy with slight asymmetry of the lateral ventricles, not felt to be significant. Vascular: No hyperdense vessel or unexpected calcification. Skull: Normal. Negative for fracture or focal lesion. Sinuses/Orbits: Normal. Other: None IMPRESSION: No acute or significant abnormalities. Electronically Signed   By: Lorriane Shire M.D.   On: 07/29/2019 13:44   Dg Chest Port 1 View  Result Date: 08/01/2019 CLINICAL DATA:  Cough.  Covid positive. EXAM: PORTABLE CHEST 1 VIEW COMPARISON:  07/29/2019 FINDINGS: 0958 hours. Lungs are mildly hyperexpanded. The peripheral interstitial and airspace opacity seen in the left mid lung today appears progressive in the interval. Progressive airspace disease at the left base obscures the hemidiaphragm. Similar appearance of interstitial and airspace opacity at the right base. Prominent skin fold over the right upper lung. The cardiopericardial  silhouette is within normal limits for size. The visualized bony structures of the thorax are intact. IMPRESSION: Interval progression of interstitial and airspace disease in the left mid lung and left base. Similar appearance of right basilar airspace opacity Electronically Signed   By: Misty Stanley M.D.   On: 08/01/2019 11:51   Dg Chest Portable 1 View  Result Date: 07/29/2019 CLINICAL DATA:  Cough. EXAM: PORTABLE CHEST 1 VIEW COMPARISON:  Radiographs of October 14, 2018. FINDINGS: The heart size and mediastinal contours are within normal limits. No pneumothorax or pleural effusion is noted. Mild bibasilar subsegmental atelectasis is noted. The visualized skeletal structures are unremarkable. IMPRESSION: Mild bibasilar subsegmental atelectasis is noted which is increased compared to prior exam. Electronically Signed   By: Marijo Conception M.D.   On: 07/29/2019 12:13   [place] Ekg Site Rite  Result Date: 08/01/2019 If Site Rite image not attached, placement could not be confirmed due to current cardiac rhythm.    Subjective: - no chest pain, shortness of breath, no abdominal pain, nausea or vomiting.   Discharge Exam: BP 108/66 (BP Location: Left Wrist)    Pulse 60    Temp 98.5 F (36.9 C) (Axillary)  Resp 20    Ht 5\' 7"  (1.702 m)    Wt 79 kg    SpO2 92%    BMI 27.28 kg/m   General: Pt is alert, awake, not in acute distress Cardiovascular: RRR, S1/S2 +, no rubs, no gallops Respiratory: CTA bilaterally, no wheezing, no rhonchi Abdominal: Soft, NT, ND, bowel sounds + Extremities: no edema, no cyanosis   The results of significant diagnostics from this hospitalization (including imaging, microbiology, ancillary and laboratory) are listed below for reference.     Microbiology: Recent Results (from the past 240 hour(s))  Urine Culture     Status: Abnormal   Collection Time: 07/27/19 12:38 PM   Specimen: Urine  Result Value Ref Range Status   MICRO NUMBER: [id number]  Final   SPECIMEN  QUALITY: Adequate  Final   Sample Source NOT GIVEN  Final   STATUS: FINAL  Final   ISOLATE 1: Escherichia coli (A)  Final    Comment: Greater than 100,000 CFU/mL of Escherichia coli      Susceptibility   Escherichia coli - URINE CULTURE, REFLEX    AMOX/CLAVULANIC <=2 Sensitive     AMPICILLIN <=2 Sensitive     AMPICILLIN/SULBACTAM <=2 Sensitive     CEFAZOLIN* <=4 Not Reportable      * For infections other than uncomplicated UTIcaused by E. coli, K. pneumoniae or P. mirabilis:Cefazolin is resistant if MIC > or = 8 mcg/mL.(Distinguishing susceptible versus intermediatefor isolates with MIC < or = 4 mcg/mL requiresadditional testing.)For uncomplicated UTI caused by E. coli,K. pneumoniae or P. mirabilis: Cefazolin issusceptible if MIC <32 mcg/mL and predictssusceptible to the oral agents cefaclor, cefdinir,cefpodoxime, cefprozil, cefuroxime, cephalexinand loracarbef.    CEFEPIME <=1 Sensitive     CEFTRIAXONE <=1 Sensitive     CIPROFLOXACIN <=0.25 Sensitive     LEVOFLOXACIN <=0.12 Sensitive     ERTAPENEM <=0.5 Sensitive     GENTAMICIN <=1 Sensitive     IMIPENEM <=0.25 Sensitive     NITROFURANTOIN <=16 Sensitive     PIP/TAZO <=4 Sensitive     TOBRAMYCIN <=1 Sensitive     TRIMETH/SULFA* <=20 Sensitive      * For infections other than uncomplicated UTIcaused by E. coli, K. pneumoniae or P. mirabilis:Cefazolin is resistant if MIC > or = 8 mcg/mL.(Distinguishing susceptible versus intermediatefor isolates with MIC < or = 4 mcg/mL requiresadditional testing.)For uncomplicated UTI caused by E. coli,K. pneumoniae or P. mirabilis: Cefazolin issusceptible if MIC <32 mcg/mL and predictssusceptible to the oral agents cefaclor, cefdinir,cefpodoxime, cefprozil, cefuroxime, cephalexinand loracarbef.Legend:S = Susceptible  I = IntermediateR = Resistant  NS = Not susceptible* = Not tested  NR = Not reported**NN = See antimicrobic comments  SARS Coronavirus 2 [facility] order, Performed in [facility] hospital lab)  Nasopharyngeal Nasopharyngeal Swab     Status: Abnormal   Collection Time: 07/29/19  1:44 PM   Specimen: Nasopharyngeal Swab  Result Value Ref Range Status   SARS Coronavirus 2 POSITIVE (A) NEGATIVE Final    Comment: RESULT CALLED TO, READ BACK BY AND VERIFIED WITH: BRIANNA CHAPMAN 07/29/19 1453 KLW (NOTE) If result is NEGATIVE SARS-CoV-2 target nucleic acids are NOT DETECTED. The SARS-CoV-2 RNA is generally detectable in upper and lower  respiratory specimens during the acute phase of infection. The lowest  concentration of SARS-CoV-2 viral copies this assay can detect is 250  copies / mL. A negative result does not preclude SARS-CoV-2 infection  and should not be used as the sole basis for treatment or other  patient management decisions.  A negative result may occur with  improper specimen collection / handling, submission of specimen other  than nasopharyngeal swab, presence of viral mutation(s) within the  areas targeted by this assay, and inadequate number of viral copies  (<250 copies / mL). A negative result must be combined with clinical  observations, patient history, and epidemiological information. If result is POSITIVE SARS-CoV-2 target nucleic acids are DETECTED. The  SARS-CoV-2 RNA is generally detectable in upper and lower  respiratory specimens during the acute phase of infection.  Positive  results are indicative of active infection with SARS-CoV-2.  Clinical  correlation with patient history and other diagnostic information is  necessary to determine patient infection status.  Positive results do  not rule out bacterial infection or co-infection with other viruses. If result is PRESUMPTIVE POSTIVE SARS-CoV-2 nucleic acids MAY BE PRESENT.   A presumptive positive result was obtained on the submitted specimen  and confirmed on repeat testing.  While 2019 novel coronavirus  (SARS-CoV-2) nucleic acids may be present in the submitted sample  additional confirmatory  testing may be necessary for epidemiological  and / or clinical management purposes  to differentiate between  SARS-CoV-2 and other Sarbecovirus currently known to infect humans.  If clinically indicated additional testing with an alternate test  methodology [phone]) is a dvised. The SARS-CoV-2 RNA is generally  detectable in upper and lower respiratory specimens during the acute  phase of infection. The expected result is Negative. Fact Sheet for Patients:  StrictlyIdeas.no Fact Sheet for Healthcare Providers: BankingDealers.co.za This test is not yet approved or cleared by the [place] FDA and has been authorized for detection and/or diagnosis of SARS-CoV-2 by FDA under an Emergency Use Authorization (EUA).  This EUA will remain in effect (meaning this test can be used) for the duration of the COVID-19 declaration under Section 564(b)(1) of the Act, 21 U.S.C. section 360bbb-3(b)(1), unless the authorization is terminated or revoked sooner. Performed at [facility], [address] [place], [place] [address]      Labs: BNP (last 3 results) Recent Labs    07/29/19 2055  BNP 646.8*   Basic Metabolic Panel: Recent Labs  Lab 08/02/19 0515 08/03/19 0600 08/04/19 0450 08/05/19 0415 08/06/19 0400  NA 140 141 142 142 135  K 3.8 4.4 4.1 4.2 4.2  CL 107 107 106 107 100  CO2 25 21* 27 28 28   GLUCOSE 145* 130* 153* 161* 267*  BUN 30* 36* 39* 44* 43*  CREATININE 0.62 0.66 0.57 0.71 0.74  CALCIUM 8.8* 9.0 8.8* 8.7* 8.7*   Liver Function Tests: Recent Labs  Lab 08/02/19 0515 08/03/19 0600 08/04/19 0450 08/05/19 0415 08/06/19 0400  AST 37 33 46* 35 21  ALT 22 25 44 50* 40  ALKPHOS 43 39 42 43 43  BILITOT 0.7 0.2* 0.4 0.5 0.4  PROT 6.2* 6.0* 6.1* 6.0* 5.5*  ALBUMIN 2.7* 2.6* 2.8* 2.8* 2.6*   No results for input(s): LIPASE, AMYLASE in the last 168 hours. No results for input(s): AMMONIA in the last 168  hours. CBC: No results for input(s): WBC, NEUTROABS, HGB, HCT, MCV, PLT in the last 168 hours. Cardiac Enzymes: No results for input(s): CKTOTAL, CKMB, CKMBINDEX, TROPONINI in the last 168 hours. BNP: Invalid input(s): POCBNP CBG: No results for input(s): GLUCAP in the last 168 hours. D-Dimer Recent Labs    08/05/19 0415 08/06/19 0400  DDIMER 0.66* 0.55*   Hgb A1c No results for input(s): HGBA1C in the last 72 hours. Lipid Profile No  results for input(s): CHOL, HDL, LDLCALC, TRIG, CHOLHDL, LDLDIRECT in the last 72 hours. Thyroid function studies No results for input(s): TSH, T4TOTAL, T3FREE, THYROIDAB in the last 72 hours.  Invalid input(s): FREET3 Anemia work up Recent Labs    08/05/19 0430 08/06/19 0400  FERRITIN 373* 295   Urinalysis    Component Value Date/Time   COLORURINE AMBER (A) 07/29/2019 1344   APPEARANCEUR HAZY (A) 07/29/2019 1344   LABSPEC 1.016 07/29/2019 1344   PHURINE 6.0 07/29/2019 1344   GLUCOSEU NEGATIVE 07/29/2019 1344   HGBUR SMALL (A) 07/29/2019 1344   BILIRUBINUR NEGATIVE 07/29/2019 1344   BILIRUBINUR negative 07/27/2019 1036   KETONESUR 5 (A) 07/29/2019 1344   PROTEINUR 30 (A) 07/29/2019 1344   UROBILINOGEN 0.2 07/27/2019 1036   NITRITE NEGATIVE 07/29/2019 1344   LEUKOCYTESUR MODERATE (A) 07/29/2019 1344   Sepsis Labs Invalid input(s): PROCALCITONIN,  WBC,  LACTICIDVEN  FURTHER DISCHARGE INSTRUCTIONS:   Get Medicines reviewed and adjusted: Please take all your medications with you for your next visit with your Primary MD   Laboratory/radiological data: Please request your Primary MD to go over all hospital tests and procedure/radiological results at the follow up, please ask your Primary MD to get all Hospital records sent to his/her office.   In some cases, they will be blood work, cultures and biopsy results pending at the time of your discharge. Please request that your primary care M.D. goes through all the records of your  hospital data and follows up on these results.   Also Note the following: If you experience worsening of your admission symptoms, develop shortness of breath, life threatening emergency, suicidal or homicidal thoughts you must seek medical attention immediately by calling 911 or calling your MD immediately  if symptoms less severe.   You must read complete instructions/literature along with all the possible adverse reactions/side effects for all the Medicines you take and that have been prescribed to you. Take any new Medicines after you have completely understood and accpet all the possible adverse reactions/side effects.    Do not drive when taking Pain medications or sleeping medications (Benzodaizepines)   Do not take more than prescribed Pain, Sleep and Anxiety Medications. It is not advisable to combine anxiety,sleep and pain medications without talking with your primary care practitioner   Special Instructions: If you have smoked or chewed Tobacco  in the last 2 yrs please stop smoking, stop any regular Alcohol  and or any Recreational drug use.   Wear Seat belts while driving.   Please note: You were cared for by a hospitalist during your hospital stay. Once you are discharged, your primary care physician will handle any further medical issues. Please note that NO REFILLS for any discharge medications will be authorized once you are discharged, as it is imperative that you return to your primary care physician (or establish a relationship with a primary care physician if you do not have one) for your post hospital discharge needs so that they can reassess your need for medications and monitor your lab values.  Time coordinating discharge: 40 minutes  SIGNED:  Marzetta Board, MD, PhD 08/06/2019, 10:03 AM

## 2019-08-06 NOTE — Plan of Care (Signed)
Will continue with plan of care. 

## 2019-08-06 NOTE — TOC Transition Note (Signed)
Transition of Care [facility]) - CM/SW Discharge Note   Patient Details  Name: Regina Harris MRN: [id number] Date of Birth: [birth year]  Transition of [facility]) CM/SW Contact:  Weston Anna, LCSW Phone Number: 08/06/2019, 11:11 AM   Clinical Narrative:     Patient set to discharge to [facility] today- grand daughter, [name], notified. DC summary faxed. Please call report to [phone].   Patient will need transportation via [facility].   Final next level of care: Skilled Nursing Facility Barriers to Discharge: No Barriers Identified   Patient Goals and CMS Choice Patient states their goals for this hospitalization and ongoing recovery are:: Return home CMS Medicare.gov Compare Post Acute Care list provided to:: Patient Represenative (must comment)(Daughter and granddaughter) Choice offered to / list presented to : Adult Children  Discharge Placement              Patient [facility] bed at: [facility] Patient to be transferred to facility by: [place] Name of family member notified: [name]- granddaughter Patient and family notified of of transfer: 08/06/19  Discharge Plan and Services In-house Referral: Clinical Social Work Discharge Planning Services: [employer] Consult Post Acute Care Choice: Home Health          DME Arranged: N/A         HH Arranged: NA          Social Determinants of Health (SDOH) Interventions     Readmission Risk Interventions No flowsheet data found.

## 2019-08-06 NOTE — Progress Notes (Signed)
Report called to jasmine at [facility]. Patient sating 96% on room air. No distress noted. Family aware. Awaiting transport.

## 2019-08-06 NOTE — Consult Note (Signed)
   [facility] CM Inpatient Consult   08/06/2019  LATRESE CAROLAN 1936/05/02 [id number]  LLOS: 7 days  Patient screened for disposition needs in [place] [facility].  Patient is to transition to a skilled facility.  MD progress notes from brief narrative is: 83 year old female with hypertension, type 2 diabetes mellitus, came into the hospital and is admitted on 07/29/2019 due to poor appetite, progressive weakness, diagnosed with a UTI and also tested positive for COVID-19.  She was found to be more confused and encephalopathic on admission.  Plan:  If patient goes to a [facility] affiliate facility will have [facility] nurse follow for transitional needs, if returning home. Will note that the patient's contact is listed as Terri Piedra her granddaughter [phone])   Please place a [facility] consult as appropriate and for questions contact:   Natividad Brood, RN BSN [facility] Liaison  [phone] business mobile phone Toll free office [phone]  Fax number: [phone] [email or web address]

## 2019-08-06 NOTE — Progress Notes (Signed)
  Speech Language Pathology Treatment: Dysphagia  Patient Details Name: Regina Harris MRN: [id number] DOB: 07/31/1936 Today's Date: 08/06/2019 Time: 0160-1093 SLP Time Calculation (min) (ACUTE ONLY): 9 min  Assessment / Plan / Recommendation Clinical Impression  Pt continues to cough with any thin liquids. I am hopeful that she may be able to return to thin liquids if her overall endurance and mentation can get closer to her baseline as acute illnesses resolve, but today she is still impacted by lethargy. Mod cues were provided to maintain alertness and initiate intake. She is agreeable to try POs, but she has a hard time staying awake in between sips. She would benefit from additional SLP f/u at SNF for ongoing dysphagia tx.    HPI HPI: Pt is an 83 yo female admitted with UTI and progressive weakness, found to have COVID-19. PMH:  HTN, DM-2, HLD, GERD, esophagitis, HH, Barrett's esophagus      SLP Plan  Continue with current plan of care       Recommendations  Diet recommendations: Dysphagia 1 (puree);Nectar-thick liquid Liquids provided via: Cup;Straw Medication Administration: Crushed with puree Supervision: Staff to assist with self feeding;Full supervision/cueing for compensatory strategies Compensations: Minimize environmental distractions;Slow rate;Small sips/bites Postural Changes and/or Swallow Maneuvers: Seated upright 90 degrees;Upright 30-60 min after meal                Oral Care Recommendations: Oral care BID Follow up Recommendations: Skilled Nursing facility SLP Visit Diagnosis: Dysphagia, unspecified (R13.10) Plan: Continue with current plan of care       GO                Venita Sheffield Cyndra Feinberg 08/06/2019, 10:37 AM  Pollyann Glen, M.A. [employer] Acute Environmental education officer [phone] Office [phone]

## 2019-08-07 DIAGNOSIS — I10 Essential (primary) hypertension: Secondary | ICD-10-CM | POA: Diagnosis not present

## 2019-08-07 DIAGNOSIS — R05 Cough: Secondary | ICD-10-CM | POA: Diagnosis not present

## 2019-08-07 DIAGNOSIS — J1289 Other viral pneumonia: Secondary | ICD-10-CM | POA: Diagnosis not present

## 2019-08-07 DIAGNOSIS — I5189 Other ill-defined heart diseases: Secondary | ICD-10-CM | POA: Diagnosis not present

## 2019-08-07 DIAGNOSIS — U071 COVID-19: Secondary | ICD-10-CM | POA: Diagnosis not present

## 2019-08-13 DIAGNOSIS — J1289 Other viral pneumonia: Secondary | ICD-10-CM | POA: Diagnosis not present

## 2019-08-13 DIAGNOSIS — Z720 Tobacco use: Secondary | ICD-10-CM | POA: Diagnosis not present

## 2019-08-13 DIAGNOSIS — U071 COVID-19: Secondary | ICD-10-CM | POA: Diagnosis not present

## 2019-08-13 DIAGNOSIS — F0280 Dementia in other diseases classified elsewhere without behavioral disturbance: Secondary | ICD-10-CM | POA: Diagnosis not present

## 2019-08-13 DIAGNOSIS — I5189 Other ill-defined heart diseases: Secondary | ICD-10-CM | POA: Diagnosis not present

## 2019-08-13 DIAGNOSIS — I10 Essential (primary) hypertension: Secondary | ICD-10-CM | POA: Diagnosis not present

## 2019-08-13 DIAGNOSIS — D751 Secondary polycythemia: Secondary | ICD-10-CM | POA: Diagnosis not present

## 2019-08-13 DIAGNOSIS — D6949 Other primary thrombocytopenia: Secondary | ICD-10-CM | POA: Diagnosis not present

## 2019-08-18 DIAGNOSIS — I5189 Other ill-defined heart diseases: Secondary | ICD-10-CM | POA: Diagnosis not present

## 2019-08-18 DIAGNOSIS — E871 Hypo-osmolality and hyponatremia: Secondary | ICD-10-CM | POA: Diagnosis not present

## 2019-08-18 DIAGNOSIS — Z7189 Other specified counseling: Secondary | ICD-10-CM | POA: Diagnosis not present

## 2019-08-18 DIAGNOSIS — J1289 Other viral pneumonia: Secondary | ICD-10-CM | POA: Diagnosis not present

## 2019-08-18 DIAGNOSIS — U071 COVID-19: Secondary | ICD-10-CM | POA: Diagnosis not present

## 2019-08-18 DIAGNOSIS — E876 Hypokalemia: Secondary | ICD-10-CM | POA: Diagnosis not present

## 2019-08-20 DIAGNOSIS — E876 Hypokalemia: Secondary | ICD-10-CM | POA: Diagnosis not present

## 2019-08-20 DIAGNOSIS — U071 COVID-19: Secondary | ICD-10-CM | POA: Diagnosis not present

## 2019-08-20 DIAGNOSIS — E871 Hypo-osmolality and hyponatremia: Secondary | ICD-10-CM | POA: Diagnosis not present

## 2019-08-24 ENCOUNTER — Other Ambulatory Visit: Payer: Self-pay

## 2019-08-24 DIAGNOSIS — K219 Gastro-esophageal reflux disease without esophagitis: Secondary | ICD-10-CM | POA: Diagnosis not present

## 2019-08-24 DIAGNOSIS — I10 Essential (primary) hypertension: Secondary | ICD-10-CM | POA: Diagnosis not present

## 2019-08-24 DIAGNOSIS — E876 Hypokalemia: Secondary | ICD-10-CM | POA: Diagnosis not present

## 2019-08-24 DIAGNOSIS — E871 Hypo-osmolality and hyponatremia: Secondary | ICD-10-CM | POA: Diagnosis not present

## 2019-08-24 DIAGNOSIS — E7849 Other hyperlipidemia: Secondary | ICD-10-CM | POA: Diagnosis not present

## 2019-08-24 DIAGNOSIS — Z20822 Contact with and (suspected) exposure to covid-19: Secondary | ICD-10-CM

## 2019-08-24 DIAGNOSIS — I5189 Other ill-defined heart diseases: Secondary | ICD-10-CM | POA: Diagnosis not present

## 2019-08-24 DIAGNOSIS — U071 COVID-19: Secondary | ICD-10-CM | POA: Diagnosis not present

## 2019-08-24 DIAGNOSIS — E038 Other specified hypothyroidism: Secondary | ICD-10-CM | POA: Diagnosis not present

## 2019-08-24 DIAGNOSIS — F0280 Dementia in other diseases classified elsewhere without behavioral disturbance: Secondary | ICD-10-CM | POA: Diagnosis not present

## 2019-08-24 DIAGNOSIS — D751 Secondary polycythemia: Secondary | ICD-10-CM | POA: Diagnosis not present

## 2019-08-24 DIAGNOSIS — Z5329 Procedure and treatment not carried out because of patient's decision for other reasons: Secondary | ICD-10-CM | POA: Diagnosis not present

## 2019-08-25 ENCOUNTER — Other Ambulatory Visit: Payer: Self-pay | Admitting: *Deleted

## 2019-08-25 DIAGNOSIS — I10 Essential (primary) hypertension: Secondary | ICD-10-CM

## 2019-08-25 NOTE — Patient Outreach (Signed)
Member assessed for potential [facility] Care Management needs as a benefit of [place] [employer].  Telephone call made to primary contact on file- Therapist, [employer] Ad[employer] account executive) at [phone]. Patient identifiers confirmed.   [name] confirms that Regina Harris returned home from [facility] on yesterday 08/24/19.   Writer explained [place] Management program services. [name] reports she is familiar with [facility] as her grandfather had [place] [facility] in the past. She is agreeable to [facility] follow up.  [name] reports her grandmother is doing well. States she is not sure which home health agency was arranged. States she thinks it [facility] but is not sure. Writer will outreach to facility [employer] to inquire.   Will make [place] [facility] RNCM referral. Mrs. Kellett has a medical history of recent COVID- 30, HTN, HLD, DM.   Marthenia Rolling, MSN-Ed, RN,BSN [place] Acute Care Coordinator [phone] [facility]) [phone]  (Toll free office)

## 2019-08-26 ENCOUNTER — Other Ambulatory Visit: Payer: Self-pay | Admitting: *Deleted

## 2019-08-26 LAB — NOVEL CORONAVIRUS, NAA: SARS-CoV-2, NAA: NOT DETECTED

## 2019-08-26 NOTE — Patient Outreach (Signed)
Response received from [place] planner who indicates Mrs. Formoso was set up with [facility]. States [facility] will make outreach today.  Facility discharge planner also reports that family did not want to wait for lab results (did not indicate which labs) so the SNF NP deemed member leaving AMA.  Referral has been made for [place].   Marthenia Rolling, MSN-Ed, RN,BSN [place] Acute Care Coordinator [phone] [facility]) [phone]  (Toll free office)

## 2019-08-28 ENCOUNTER — Telehealth: Payer: Self-pay

## 2019-08-28 DIAGNOSIS — E039 Hypothyroidism, unspecified: Secondary | ICD-10-CM | POA: Diagnosis not present

## 2019-08-28 DIAGNOSIS — R262 Difficulty in walking, not elsewhere classified: Secondary | ICD-10-CM | POA: Diagnosis not present

## 2019-08-28 DIAGNOSIS — K2270 Barrett's esophagus without dysplasia: Secondary | ICD-10-CM | POA: Diagnosis not present

## 2019-08-28 DIAGNOSIS — Z87440 Personal history of urinary (tract) infections: Secondary | ICD-10-CM | POA: Diagnosis not present

## 2019-08-28 DIAGNOSIS — M6281 Muscle weakness (generalized): Secondary | ICD-10-CM | POA: Diagnosis not present

## 2019-08-28 DIAGNOSIS — U071 COVID-19: Secondary | ICD-10-CM | POA: Diagnosis not present

## 2019-08-28 DIAGNOSIS — D696 Thrombocytopenia, unspecified: Secondary | ICD-10-CM | POA: Diagnosis not present

## 2019-08-28 DIAGNOSIS — Z9181 History of falling: Secondary | ICD-10-CM | POA: Diagnosis not present

## 2019-08-28 DIAGNOSIS — E119 Type 2 diabetes mellitus without complications: Secondary | ICD-10-CM | POA: Diagnosis not present

## 2019-08-28 DIAGNOSIS — D751 Secondary polycythemia: Secondary | ICD-10-CM | POA: Diagnosis not present

## 2019-08-28 DIAGNOSIS — F0390 Unspecified dementia without behavioral disturbance: Secondary | ICD-10-CM | POA: Diagnosis not present

## 2019-08-28 DIAGNOSIS — F17210 Nicotine dependence, cigarettes, uncomplicated: Secondary | ICD-10-CM | POA: Diagnosis not present

## 2019-08-28 DIAGNOSIS — Z7982 Long term (current) use of aspirin: Secondary | ICD-10-CM | POA: Diagnosis not present

## 2019-08-28 DIAGNOSIS — G9341 Metabolic encephalopathy: Secondary | ICD-10-CM | POA: Diagnosis not present

## 2019-08-28 DIAGNOSIS — J1289 Other viral pneumonia: Secondary | ICD-10-CM | POA: Diagnosis not present

## 2019-08-28 DIAGNOSIS — Z9981 Dependence on supplemental oxygen: Secondary | ICD-10-CM | POA: Diagnosis not present

## 2019-08-28 DIAGNOSIS — E785 Hyperlipidemia, unspecified: Secondary | ICD-10-CM | POA: Diagnosis not present

## 2019-08-28 DIAGNOSIS — I10 Essential (primary) hypertension: Secondary | ICD-10-CM | POA: Diagnosis not present

## 2019-08-28 NOTE — Telephone Encounter (Signed)
Patrici Ranks PT with [facility] left v/m requesting verbal orders for [facility] PT 1 x a wk for 1 wk and 2 x a wk for 6 wks. Also requesting verbal orders for [facility] aide 0 (zero) x a wk x 1 wk and 2 x a wk for 6 wks. Also pts BP was 100/56 which daughter said was pts normal; Patrici Ranks said there BP parameters for diastolic below 60 is to call PCP; does Dr Glori Bickers want to change parameters? I left v/m for Jennie with [facility] to verify orders requested for [facility] aide 0 x a wk for 1 wk.

## 2019-08-31 ENCOUNTER — Other Ambulatory Visit: Payer: Self-pay | Admitting: *Deleted

## 2019-08-31 DIAGNOSIS — E119 Type 2 diabetes mellitus without complications: Secondary | ICD-10-CM | POA: Diagnosis not present

## 2019-08-31 DIAGNOSIS — U071 COVID-19: Secondary | ICD-10-CM | POA: Diagnosis not present

## 2019-08-31 DIAGNOSIS — F0390 Unspecified dementia without behavioral disturbance: Secondary | ICD-10-CM | POA: Diagnosis not present

## 2019-08-31 DIAGNOSIS — G9341 Metabolic encephalopathy: Secondary | ICD-10-CM | POA: Diagnosis not present

## 2019-08-31 DIAGNOSIS — E039 Hypothyroidism, unspecified: Secondary | ICD-10-CM | POA: Diagnosis not present

## 2019-08-31 DIAGNOSIS — J1289 Other viral pneumonia: Secondary | ICD-10-CM | POA: Diagnosis not present

## 2019-08-31 NOTE — Patient Outreach (Signed)
[facility]) Care Management  08/31/2019  Regina Harris 1936-02-24 [id number]   POST-SNF DISCHARGE TRANSITION OF CARE Referral Received 10/21 Initial Outreach 10/26  Rn attempted outreach call today however unsuccessful. RN able to leave a HIPAA approved voice message requesting a call back.  Plan: RN will attempt another outreach call over the next week for pending services.  Raina Mina, RN Care Management Coordinator [place] Office [phone]

## 2019-09-01 DIAGNOSIS — U071 COVID-19: Secondary | ICD-10-CM | POA: Diagnosis not present

## 2019-09-01 DIAGNOSIS — E039 Hypothyroidism, unspecified: Secondary | ICD-10-CM | POA: Diagnosis not present

## 2019-09-01 DIAGNOSIS — F0390 Unspecified dementia without behavioral disturbance: Secondary | ICD-10-CM | POA: Diagnosis not present

## 2019-09-01 DIAGNOSIS — J1289 Other viral pneumonia: Secondary | ICD-10-CM | POA: Diagnosis not present

## 2019-09-01 DIAGNOSIS — G9341 Metabolic encephalopathy: Secondary | ICD-10-CM | POA: Diagnosis not present

## 2019-09-01 DIAGNOSIS — E119 Type 2 diabetes mellitus without complications: Secondary | ICD-10-CM | POA: Diagnosis not present

## 2019-09-01 NOTE — Telephone Encounter (Signed)
I spoke with Regina Harris from [facility]. I gave verbal for PT orders & she stated that the reason for 0 weeks on order was that [facility] had been started on a Friday. I also gave verbal to change diastolic parameters from 60 to 50.

## 2019-09-01 NOTE — Telephone Encounter (Signed)
Please ok those orders Change the parameter for BP to diastolic below 50 (instead of 60) thanks

## 2019-09-02 DIAGNOSIS — E039 Hypothyroidism, unspecified: Secondary | ICD-10-CM | POA: Diagnosis not present

## 2019-09-02 DIAGNOSIS — F0390 Unspecified dementia without behavioral disturbance: Secondary | ICD-10-CM | POA: Diagnosis not present

## 2019-09-02 DIAGNOSIS — U071 COVID-19: Secondary | ICD-10-CM | POA: Diagnosis not present

## 2019-09-02 DIAGNOSIS — J1289 Other viral pneumonia: Secondary | ICD-10-CM | POA: Diagnosis not present

## 2019-09-02 DIAGNOSIS — E119 Type 2 diabetes mellitus without complications: Secondary | ICD-10-CM | POA: Diagnosis not present

## 2019-09-02 DIAGNOSIS — G9341 Metabolic encephalopathy: Secondary | ICD-10-CM | POA: Diagnosis not present

## 2019-09-03 DIAGNOSIS — U071 COVID-19: Secondary | ICD-10-CM | POA: Diagnosis not present

## 2019-09-03 DIAGNOSIS — M6281 Muscle weakness (generalized): Secondary | ICD-10-CM | POA: Diagnosis not present

## 2019-09-03 DIAGNOSIS — Z7982 Long term (current) use of aspirin: Secondary | ICD-10-CM

## 2019-09-03 DIAGNOSIS — F0390 Unspecified dementia without behavioral disturbance: Secondary | ICD-10-CM | POA: Diagnosis not present

## 2019-09-03 DIAGNOSIS — E119 Type 2 diabetes mellitus without complications: Secondary | ICD-10-CM

## 2019-09-03 DIAGNOSIS — G9341 Metabolic encephalopathy: Secondary | ICD-10-CM | POA: Diagnosis not present

## 2019-09-03 DIAGNOSIS — Z87440 Personal history of urinary (tract) infections: Secondary | ICD-10-CM

## 2019-09-03 DIAGNOSIS — R262 Difficulty in walking, not elsewhere classified: Secondary | ICD-10-CM

## 2019-09-03 DIAGNOSIS — K2270 Barrett's esophagus without dysplasia: Secondary | ICD-10-CM | POA: Diagnosis not present

## 2019-09-03 DIAGNOSIS — Z9181 History of falling: Secondary | ICD-10-CM

## 2019-09-03 DIAGNOSIS — F17210 Nicotine dependence, cigarettes, uncomplicated: Secondary | ICD-10-CM | POA: Diagnosis not present

## 2019-09-03 DIAGNOSIS — Z9981 Dependence on supplemental oxygen: Secondary | ICD-10-CM

## 2019-09-03 DIAGNOSIS — D696 Thrombocytopenia, unspecified: Secondary | ICD-10-CM | POA: Diagnosis not present

## 2019-09-03 DIAGNOSIS — I10 Essential (primary) hypertension: Secondary | ICD-10-CM | POA: Diagnosis not present

## 2019-09-03 DIAGNOSIS — E785 Hyperlipidemia, unspecified: Secondary | ICD-10-CM

## 2019-09-03 DIAGNOSIS — E039 Hypothyroidism, unspecified: Secondary | ICD-10-CM

## 2019-09-03 DIAGNOSIS — J1289 Other viral pneumonia: Secondary | ICD-10-CM | POA: Diagnosis not present

## 2019-09-03 DIAGNOSIS — D751 Secondary polycythemia: Secondary | ICD-10-CM | POA: Diagnosis not present

## 2019-09-07 ENCOUNTER — Telehealth: Payer: Self-pay | Admitting: *Deleted

## 2019-09-07 ENCOUNTER — Encounter: Payer: Self-pay | Admitting: Family Medicine

## 2019-09-07 ENCOUNTER — Other Ambulatory Visit: Payer: Self-pay | Admitting: *Deleted

## 2019-09-07 DIAGNOSIS — G9341 Metabolic encephalopathy: Secondary | ICD-10-CM | POA: Diagnosis not present

## 2019-09-07 DIAGNOSIS — E039 Hypothyroidism, unspecified: Secondary | ICD-10-CM | POA: Diagnosis not present

## 2019-09-07 DIAGNOSIS — E119 Type 2 diabetes mellitus without complications: Secondary | ICD-10-CM | POA: Diagnosis not present

## 2019-09-07 DIAGNOSIS — U071 COVID-19: Secondary | ICD-10-CM | POA: Diagnosis not present

## 2019-09-07 DIAGNOSIS — J1289 Other viral pneumonia: Secondary | ICD-10-CM | POA: Diagnosis not present

## 2019-09-07 DIAGNOSIS — F0390 Unspecified dementia without behavioral disturbance: Secondary | ICD-10-CM | POA: Diagnosis not present

## 2019-09-07 NOTE — Telephone Encounter (Signed)
I think it is safe to d/c 02 if that is what they want to do given those pulse ox readings

## 2019-09-07 NOTE — Telephone Encounter (Signed)
Patrici Ranks PT with [name] called stating that when patient came home from rehab she was put on continuous 2 liters of oxygen. Patrici Ranks stated that the patient's family is questioning if she needs to be on it?  Patrici Ranks stated that the patient has a pulse ox and they have been monitoring her oxygen level. Patrici Ranks stated that the family kept the oxygen off this week and the level was between 93-96% and dropped to 90% when bathing. Patrici Ranks wants to know if they can discontinue the oxygen at this point and just monitor her oxygen level? Patrici Ranks stated that the patient has an upcoming appointment scheduled with Dr. Glori Bickers on 09/14/19.

## 2019-09-07 NOTE — Patient Outreach (Addendum)
[facility]) Care Management  09/07/2019  Regina Harris 06-Apr-1936 [id number]    Telephone Assessment-post SNF discharge  RN was able to speak briefly with the pt's granddaughter Regina [phone]) who provided permission to contact the pt directly. States she was very busy at this time but indicated pt has an Actuary in the home that I can also speak with if needed. RN will follow up with pt at the provided contact number and further inquired on possible services.   RN spoke with pt who indicates she was doing well and able to verify her identifiers. Pt states she has assistance in the home at this time and provied permission to speak with the sitter in the home Regina Harris). RN provided information on the [facility] program and services.  RN offered assistance with listing pt into a program based upon the information discussed however pt does not want weekly Transition of care as indicated. Pt states she is doing very well and does not need close monitoring on a weekly bases due to the involved services with [facility] (PT/ aide and a personal sitter daily who provides 3 meals a day and administered all medications). RN verified pt has made contact with her provider since discharged from the facility and has all prescribed medications as the daughter delivery after preparing for the aide to administer. No signs or symptoms of previous UTI or coughing. RN further inquired on HTN/DM as aide unable aware these diagnosis with no encountered issues. States these diagnoses are not monitored. States pt is doing very well eating with a good appetite no GI/GU issues as pt continue to adhere to her ongoing recovery.    Again the request has been for monthly and does not wish to receive weekly transition of care calls. RN has explained and inquired further on information needed for the initial assessment as RN has been directed to contact the granddaughter for this information at a later date. Will initiate  the preventive program and notify the primary provider of pt's disposition with [facility] services.  PLAN: RN will follow up next week with the granddaughter on obtaining additional information for the initial assessment due to her busy working schedule today.   THN CM Care Plan Problem One     Most Recent Value  Care Plan Problem One  Preventive Measures related to recent SNF d/c  Role Documenting the Problem One  Care Management Telephonic Coordinator  Care Plan for Problem One  Active  THN Long Term Goal   Pt will not have any readmission to a hospital or SNF over the next 60 days.  THN Long Term Goal Start Date  09/07/19  Interventions for Problem One Long Term Goal  Discussed the importance of SNF/hospital prevention to avoid the risk by seek medical attention with acute issues and needs.  THN CM Short Term Goal #1   Adherence with medications post op SNF.  THN CM Short Term Goal #1 Start Date  09/07/19  Interventions for Short Term Goal #1  Discussed the importance of taking all her medications as prescribed to avoid acute issues from occuring  THN CM Short Term Goal #2   Adherence with all medical appointments  [facility] CM Short Term Goal #2 Start Date  09/07/19  Interventions for Short Term Goal #2  Verified pt has sufficient transportation to all medical appointments and offer additional resources such as SCATs if needed      Raina Mina, RN Care Management Coordinator [place]  Office [phone]

## 2019-09-07 NOTE — Telephone Encounter (Signed)
Verbal order given to D/C O2 to [facility]

## 2019-09-08 DIAGNOSIS — G9341 Metabolic encephalopathy: Secondary | ICD-10-CM | POA: Diagnosis not present

## 2019-09-08 DIAGNOSIS — J1289 Other viral pneumonia: Secondary | ICD-10-CM | POA: Diagnosis not present

## 2019-09-08 DIAGNOSIS — U071 COVID-19: Secondary | ICD-10-CM | POA: Diagnosis not present

## 2019-09-08 DIAGNOSIS — E119 Type 2 diabetes mellitus without complications: Secondary | ICD-10-CM | POA: Diagnosis not present

## 2019-09-08 DIAGNOSIS — E039 Hypothyroidism, unspecified: Secondary | ICD-10-CM | POA: Diagnosis not present

## 2019-09-08 DIAGNOSIS — F0390 Unspecified dementia without behavioral disturbance: Secondary | ICD-10-CM | POA: Diagnosis not present

## 2019-09-08 NOTE — Telephone Encounter (Signed)
Order faxed and sent mychart message letting family know and to f/u with me to change visit to virtual visit

## 2019-09-10 ENCOUNTER — Encounter: Payer: Self-pay | Admitting: Family Medicine

## 2019-09-10 DIAGNOSIS — F0390 Unspecified dementia without behavioral disturbance: Secondary | ICD-10-CM | POA: Diagnosis not present

## 2019-09-10 DIAGNOSIS — J1289 Other viral pneumonia: Secondary | ICD-10-CM | POA: Diagnosis not present

## 2019-09-10 DIAGNOSIS — U071 COVID-19: Secondary | ICD-10-CM | POA: Diagnosis not present

## 2019-09-10 DIAGNOSIS — E119 Type 2 diabetes mellitus without complications: Secondary | ICD-10-CM | POA: Diagnosis not present

## 2019-09-10 DIAGNOSIS — G9341 Metabolic encephalopathy: Secondary | ICD-10-CM | POA: Diagnosis not present

## 2019-09-10 DIAGNOSIS — E039 Hypothyroidism, unspecified: Secondary | ICD-10-CM | POA: Diagnosis not present

## 2019-09-11 DIAGNOSIS — F0390 Unspecified dementia without behavioral disturbance: Secondary | ICD-10-CM | POA: Diagnosis not present

## 2019-09-11 DIAGNOSIS — G9341 Metabolic encephalopathy: Secondary | ICD-10-CM | POA: Diagnosis not present

## 2019-09-11 DIAGNOSIS — U071 COVID-19: Secondary | ICD-10-CM | POA: Diagnosis not present

## 2019-09-11 DIAGNOSIS — E039 Hypothyroidism, unspecified: Secondary | ICD-10-CM | POA: Diagnosis not present

## 2019-09-11 DIAGNOSIS — J1289 Other viral pneumonia: Secondary | ICD-10-CM | POA: Diagnosis not present

## 2019-09-11 DIAGNOSIS — E119 Type 2 diabetes mellitus without complications: Secondary | ICD-10-CM | POA: Diagnosis not present

## 2019-09-14 ENCOUNTER — Encounter: Payer: Self-pay | Admitting: Family Medicine

## 2019-09-14 ENCOUNTER — Ambulatory Visit (INDEPENDENT_AMBULATORY_CARE_PROVIDER_SITE_OTHER): Payer: Medicare Other | Admitting: Family Medicine

## 2019-09-14 ENCOUNTER — Ambulatory Visit (INDEPENDENT_AMBULATORY_CARE_PROVIDER_SITE_OTHER): Payer: Medicare Other

## 2019-09-14 ENCOUNTER — Other Ambulatory Visit (INDEPENDENT_AMBULATORY_CARE_PROVIDER_SITE_OTHER): Payer: Medicare Other

## 2019-09-14 ENCOUNTER — Other Ambulatory Visit: Payer: Medicare Other

## 2019-09-14 VITALS — BP 92/58 | Wt 183.0 lb

## 2019-09-14 DIAGNOSIS — U071 COVID-19: Secondary | ICD-10-CM

## 2019-09-14 DIAGNOSIS — I10 Essential (primary) hypertension: Secondary | ICD-10-CM

## 2019-09-14 DIAGNOSIS — R7303 Prediabetes: Secondary | ICD-10-CM | POA: Diagnosis not present

## 2019-09-14 DIAGNOSIS — E890 Postprocedural hypothyroidism: Secondary | ICD-10-CM

## 2019-09-14 DIAGNOSIS — N3000 Acute cystitis without hematuria: Secondary | ICD-10-CM | POA: Diagnosis not present

## 2019-09-14 DIAGNOSIS — Z87891 Personal history of nicotine dependence: Secondary | ICD-10-CM

## 2019-09-14 DIAGNOSIS — E039 Hypothyroidism, unspecified: Secondary | ICD-10-CM | POA: Diagnosis not present

## 2019-09-14 DIAGNOSIS — R600 Localized edema: Secondary | ICD-10-CM | POA: Diagnosis not present

## 2019-09-14 DIAGNOSIS — Z23 Encounter for immunization: Secondary | ICD-10-CM | POA: Diagnosis not present

## 2019-09-14 DIAGNOSIS — F0390 Unspecified dementia without behavioral disturbance: Secondary | ICD-10-CM | POA: Diagnosis not present

## 2019-09-14 DIAGNOSIS — J1289 Other viral pneumonia: Secondary | ICD-10-CM

## 2019-09-14 DIAGNOSIS — E7800 Pure hypercholesterolemia, unspecified: Secondary | ICD-10-CM

## 2019-09-14 DIAGNOSIS — E119 Type 2 diabetes mellitus without complications: Secondary | ICD-10-CM | POA: Diagnosis not present

## 2019-09-14 DIAGNOSIS — G9341 Metabolic encephalopathy: Secondary | ICD-10-CM

## 2019-09-14 NOTE — Progress Notes (Signed)
Virtual Visit via Video Note  I connected with Regina Harris on 09/14/19 at 10:45 AM EST by a video enabled telemedicine application and verified that I am speaking with the correct person using two identifiers.  Location: Patient: home /outdoors on porch  Provider: office   Parties taking part in encounter: Patient : Regina Harris daughter Regina Harris tx physician : Regina Pardon MD    I discussed the limitations of evaluation and management by telemedicine and the availability of in person appointments. The patient expressed understanding and agreed to proceed.  History of Present Illness: Pt presents for f/u of hospitalization  hosp from 9/23 to 10/1 with covid 19 pneumonia   Hospital Course: Principal Problem Covid-19 Viral Illness/viral pneumonia -patient was admitted to the hospital with weakness and shortness of breath, chest x-ray on admission showed bilateral infiltrates.  She was placed on Remdesivir and completed a 5-day course, also placed on Decadron and is to complete a 10-day course with 4 additional days on discharge.  She remained stable, on room air, and will be discharged to SNF due to significant weakness post Covid infection  Per family- major symptom was confusion    COVID-19 Labs  Recent Labs (last 2 labs)         Recent Labs    08/04/19 0450 08/05/19 0415 08/05/19 0430 08/06/19 0400  DDIMER 0.77* 0.66*  --  0.55*  FERRITIN 452*  --  373* 295  LDH 272* 279*  --  221*  CRP 0.8  --  <0.8 <0.8      Recent Labs       Lab Results  Component Value Date   SARSCOV2NAA POSITIVE (A) 07/29/2019      Active Problems Acute metabolic encephalopathy -Multifactorial due to UTI as well as Covid, mental status appears improved this morning, it is noted that she is however on Aricept so suspect a degree of underlying dementia.  Continue Aricept Hypothyroidism -continue Synthroid Hypertension -resume home medications Hyperlipidemia -continue  statin UTI -status post 5 days of ceftriaxone completed while hospitalized  Has been home resting Feels back to her pre covid baseline  Staying up most of the day  Rests maybe once during the day  Still has a caregiver 24 hours  For safety-she forgets to use her walker  Wants to avoid falls   Wt Readings from Last 3 Encounters:  08/06/19 174 lb 2.6 oz (79 kg)  07/29/19 190 lb (86.2 kg)  12/30/18 195 lb 4 oz (88.6 kg)  her weight at home 183 lb  Eats 3 meals per day (even if not hungry)   Came home from rehab 10/20   She may have caught covid from a caregiver who never had symptoms   There was some concern about uti last week  Had more frequency 2-3 times an hour  Then back to normal over the weekend (water and cranberry)  Is hard for her to wipe well after bathroom-now family is helping her more  Baseline incontinence in bed at night   Regina Harris is coming out  Doing PT  Mobility is much better  Getting in /out of car is taxing   Negative test on 10/19   She has mild pitting edema  Takes lasix daily - about back to baseline   Has not smoked since she went in the hospital  Asked for a cig a few times  Family told her with 02 tank in house she could not   -so she has  not smoked    K got low in SNF- they had not been giving her enough there-not the right dose for 2 wk  Back to normal  Family had to sign her out AMA (due to K)   Continues to be DNR  Coming home made a big difference  92/58  bp now   TSH is due for endo also  Patient Active Problem List   Diagnosis Date Noted  . Acute metabolic encephalopathy 96/29/5284  . Acute lower UTI 07/29/2019  . Pneumonia due to COVID-19 virus 07/29/2019  . COVID-19 virus infection 07/29/2019  . UTI (urinary tract infection) 07/27/2019  . Nasal congestion 07/27/2019  . B12 deficiency 01/05/2019  . Memory loss 12/30/2018  . Poor balance 12/30/2018  . Low back pain 10/14/2018  . Fatigue 10/01/2018  . Abnormal  urinalysis 10/01/2018  . 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 Right leg swelling 06/09/2018  . Right knee pain 06/09/2018  . Baker's cyst of knee, right 06/09/2018  . Encounter for screening mammogram for breast cancer 02/27/2017  . Estrogen deficiency 02/27/2017  . Foot pain, bilateral 02/27/2017  . Hypothyroidism 04/16/2016  . Pedal edema 04/05/2016  . Right rotator cuff tear 03/09/2016  . Hypokalemia 03/01/2016  . Nodule of chest wall 10/06/2014  . Left breast mass 06/30/2014  . Encounter for Medicare annual wellness exam 02/22/2014  . Polycythemia, secondary 02/22/2014  . GERD (gastroesophageal reflux disease) 08/24/2013  . Lump or mass in breast 05/06/2013  . Post-menopausal 06/24/2012  . Obesity 12/24/2011  . Prediabetes 03/12/2011  . NEOPLASM UNSPECIFIED NATURE DIGESTIVE SYSTEM 07/24/2010  . LUNG NODULE 01/16/2010  . HYPERCHOLESTEROLEMIA, PURE 07/10/2007  . Smoker 03/19/2007  . Essential hypertension 03/19/2007  . Carotid bruit 03/19/2007  . COLONIC POLYPS, HX OF 03/19/2007   Past Medical History:  Diagnosis Date  . Barrett esophagus   . Carotid bruit   . Diabetes mellitus without complication ([place])   . Esophagitis   . Gastritis 2013  . GERD (gastroesophageal reflux disease)   . HH (hiatus hernia)   . History of colonic polyps   . History of repair of right rotator cuff   . Hyperlipidemia   . Hypertension   . Hyperthyroidism 06/09/2018   Per [facility] RAI Therapy for Hyperthyroidism order  . Lump or mass in breast   . Pneumonia   . Polycythemia, secondary   . Tobacco abuse    Past Surgical History:  Procedure Laterality Date  . ABDOMINAL HYSTERECTOMY    . APPENDECTOMY    . BREAST BIOPSY Right 1996  . BREAST BIOPSY Left 10/09/2012   Benign breast tissue with focal fat necrosis and focal periductal chronic inflammation.  Marland Kitchen BREAST BIOPSY Left 10/09/2012   Benign breast tissue with focal fat necrosis and focal periductal chronic inflammation.  Marland Kitchen CATARACT  EXTRACTION  3/11   Dr Regina Harris  . CHOLECYSTECTOMY    . COLONOSCOPY  2009  . ESOPHAGOGASTRODUODENOSCOPY (EGD) WITH PROPOFOL N/A 11/02/2015   Procedure: ESOPHAGOGASTRODUODENOSCOPY (EGD) WITH PROPOFOL;  Surgeon: Manya Silvas, MD;  Location: [facility] ENDOSCOPY;  Service: Endoscopy;  Laterality: N/A;  . SHOULDER ARTHROSCOPY WITH OPEN ROTATOR CUFF REPAIR Right 03/05/2016   Procedure: SHOULDER ARTHROSCOPY WITH OPEN ROTATOR CUFF REPAIR;  Surgeon: Earnestine Leys, MD;  Location: [facility] ORS;  Service: Orthopedics;  Laterality: Right;  . UPPER GI ENDOSCOPY  2013   Social History   Tobacco Use  . Smoking status: Current Every Day Smoker    Packs/day: 1.00    Types: Cigarettes  . Smokeless  tobacco: Never Used  Substance Use Topics  . Alcohol use: No    Alcohol/week: 0.0 standard drinks  . Drug use: No   Family History  Problem Relation Age of Onset  . Kidney cancer Father   . Diabetes Mother   . Breast cancer Neg Hx    No Known Allergies Current Outpatient Medications on File Prior to Visit  Medication Sig Dispense Refill  . aspirin EC 81 MG tablet Take 81 mg by mouth daily.    Marland Kitchen atorvastatin (LIPITOR) 10 MG tablet TAKE 1 TABLET BY MOUTH ONCE DAILY 90 tablet 1  . Calcium Carb-Cholecalciferol (CALCIUM 1000 + D PO) Take 1 capsule by mouth daily.    . cyanocobalamin 1000 MCG tablet Take 1,000 mcg by mouth daily.    Marland Kitchen donepezil (ARICEPT) 5 MG tablet Take 1 tablet (5 mg total) by mouth at bedtime. 30 tablet 11  . furosemide (LASIX) 20 MG tablet TAKE 1 TABLET BY MOUTH ONCE A DAY 90 tablet 1  . gabapentin (NEURONTIN) 400 MG capsule TAKE 1 CAPSULE BY MOUTH TWICE DAILY 180 capsule 1  . hydrochlorothiazide (HYDRODIURIL) 25 MG tablet Take 1 tablet (25 mg total) by mouth daily. 90 tablet 3  . levothyroxine (SYNTHROID) 75 MCG tablet Take 1 tablet by mouth daily with breakfast.    . loratadine (CLARITIN) 10 MG tablet Take 10 mg by mouth daily.    Marland Kitchen omeprazole (PRILOSEC) 20 MG capsule TAKE 1 CAPSULE BY MOUTH  TWICE DAILY 180 capsule 1  . potassium chloride SA (K-DUR) 20 MEQ tablet TAKE 1 TABLET BY MOUTH TWICE (2) DAILY 180 tablet 1  . vitamin C (VITAMIN C) 500 MG tablet Take 1 tablet (500 mg total) by mouth 2 (two) times daily.    Marland Kitchen zinc sulfate 220 (50 Zn) MG capsule Take 1 capsule (220 mg total) by mouth daily.    . [DISCONTINUED] potassium chloride (K-DUR) 10 MEQ tablet Take 1 tablet (10 mEq total) by mouth daily. 30 tablet 3   No current facility-administered medications on file prior to visit.     Review of Systems  Constitutional: Positive for malaise/fatigue. Negative for chills and fever.  HENT: Negative for congestion, ear pain, sinus pain and sore throat.   Eyes: Negative for blurred vision, discharge and redness.  Respiratory: Negative for cough, shortness of breath and stridor.   Cardiovascular: Negative for chest pain, palpitations and leg swelling.  Gastrointestinal: Negative for abdominal pain, diarrhea, nausea and vomiting.  Musculoskeletal: Negative for myalgias.  Skin: Negative for rash.  Neurological: Positive for weakness. Negative for dizziness, speech change, focal weakness and headaches.       Poor balance  Psychiatric/Behavioral: Negative for depression.    Observations/Objective: Patient appears well, in no distress Weight is down from baseline  No facial swelling or asymmetry Normal voice-not hoarse and no slurred speech No obvious tremor Mobility impaired -needs assist an walker to ambulate  Moving neck and UEs normally Some hearing impairment noted No cough or shortness of breath during interview  Talkative and mentally sharp with no cognitive changes No skin changes on face or neck , no rash or pallor Affect is normal  Grand daughter helps with hx and communication due to hearing    Assessment and Plan: Problem List Items Addressed This Visit      Cardiovascular and Mediastinum   Essential hypertension    bp is in the low normal range w/o any  orthostatic symptoms  Home care continues to monitor  92/58 today  Labs ordered       Relevant Orders   CBC with Differential   Comprehensive metabolic panel   TSH   Lipid panel     Respiratory   Pneumonia due to COVID-19 virus - Primary    Reviewed hospital records, lab results and studies in detail  Much clinical improvement after rehab stay and now home with home care  Continues 24 h monitoring to prevent falls  Not smoking at all         Endocrine   Hypothyroidism    TSH today Has upcoming appt with endocrinology      Relevant Orders   TSH     Nervous and Auditory   Acute metabolic encephalopathy     Genitourinary   UTI (urinary tract infection)    Resolved after hosp tx Reviewed hospital records, lab results and studies in detail          Other   HYPERCHOLESTEROLEMIA, PURE    Due for lipid panel Added to labs      Relevant Orders   Lipid panel   Former smoker    No cigarettes since hospitalization  Family enc her to stay quit       Prediabetes   Pedal edema    Enc to elevate feet when sitting            Follow Up Instructions: We will call you to set up labs and flu shot Keep drinking fluids No change in medications Watch for low BP Continue not to smoke!    I discussed the assessment and treatment plan with the patient. The patient was provided an opportunity to ask questions and all were answered. The patient agreed with the plan and demonstrated an understanding of the instructions.   The patient was advised to call back or seek an in-person evaluation if the symptoms worsen or if the condition fails to improve as anticipated.     Regina Pardon, MD

## 2019-09-14 NOTE — Assessment & Plan Note (Signed)
bp is in the low normal range w/o any orthostatic symptoms  Home care continues to monitor  92/58 today  Labs ordered

## 2019-09-14 NOTE — Assessment & Plan Note (Signed)
Due for lipid panel Added to labs

## 2019-09-14 NOTE — Assessment & Plan Note (Signed)
Resolved after hosp tx Reviewed hospital records, lab results and studies in detail

## 2019-09-14 NOTE — Patient Instructions (Signed)
We will call you to set up labs and flu shot Keep drinking fluids No change in medications Watch for low BP Continue not to smoke!

## 2019-09-14 NOTE — Assessment & Plan Note (Signed)
TSH today Has upcoming appt with endocrinology

## 2019-09-14 NOTE — Assessment & Plan Note (Signed)
No cigarettes since hospitalization  Family enc her to stay quit

## 2019-09-14 NOTE — Assessment & Plan Note (Signed)
Enc to elevate feet when sitting

## 2019-09-14 NOTE — Assessment & Plan Note (Signed)
Reviewed hospital records, lab results and studies in detail  Much clinical improvement after rehab stay and now home with home care  Continues 24 h monitoring to prevent falls  Not smoking at all

## 2019-09-15 LAB — COMPREHENSIVE METABOLIC PANEL
ALT: 7 U/L (ref 0–35)
AST: 12 U/L (ref 0–37)
Albumin: 3.1 g/dL — ABNORMAL LOW (ref 3.5–5.2)
Alkaline Phosphatase: 73 U/L (ref 39–117)
BUN: 15 mg/dL (ref 6–23)
CO2: 30 mEq/L (ref 19–32)
Calcium: 9 mg/dL (ref 8.4–10.5)
Chloride: 102 mEq/L (ref 96–112)
Creatinine, Ser: 0.89 mg/dL (ref 0.40–1.20)
GFR: 60.78 mL/min (ref >60.00)
Glucose, Bld: 159 mg/dL — ABNORMAL HIGH (ref 70–99)
Potassium: 4.3 mEq/L (ref 3.5–5.1)
Sodium: 139 mEq/L (ref 135–145)
Total Bilirubin: 0.4 mg/dL (ref 0.2–1.2)
Total Protein: 5.7 g/dL — ABNORMAL LOW (ref 6.0–8.3)

## 2019-09-15 LAB — LIPID PANEL
Cholesterol: 152 mg/dL (ref 0–200)
HDL: 46.8 mg/dL (ref >39.00)
NonHDL: 104.89
Total CHOL/HDL Ratio: 3
Triglycerides: 213 mg/dL — ABNORMAL HIGH (ref 0.0–149.0)
VLDL: 42.6 mg/dL — ABNORMAL HIGH (ref 0.0–40.0)

## 2019-09-15 LAB — CBC WITH DIFFERENTIAL/PLATELET
Basophils Absolute: 0.1 10*3/uL (ref 0.0–0.1)
Basophils Relative: 0.9 % (ref 0.0–3.0)
Eosinophils Absolute: 0.1 10*3/uL (ref 0.0–0.7)
Eosinophils Relative: 1.3 % (ref 0.0–5.0)
HCT: 39.6 % (ref 36.0–46.0)
Hemoglobin: 13.2 g/dL (ref 12.0–15.0)
Lymphocytes Relative: 21.7 % (ref 12.0–46.0)
Lymphs Abs: 1.4 10*3/uL (ref 0.7–4.0)
MCHC: 33.3 g/dL (ref 30.0–36.0)
MCV: 95.8 fl (ref 78.0–100.0)
Monocytes Absolute: 0.6 10*3/uL (ref 0.1–1.0)
Monocytes Relative: 9.9 % (ref 3.0–12.0)
Neutro Abs: 4.3 10*3/uL (ref 1.4–7.7)
Neutrophils Relative %: 66.2 % (ref 43.0–77.0)
Platelets: 189 10*3/uL (ref 150.0–400.0)
RBC: 4.13 Mil/uL (ref 3.87–5.11)
RDW: 15.8 % — ABNORMAL HIGH (ref 11.5–15.5)
WBC: 6.5 10*3/uL (ref 4.0–10.5)

## 2019-09-15 LAB — TSH: TSH: 3.33 u[IU]/mL (ref 0.35–4.50)

## 2019-09-15 LAB — LDL CHOLESTEROL, DIRECT: Direct LDL: 74 mg/dL

## 2019-09-16 ENCOUNTER — Other Ambulatory Visit: Payer: Self-pay | Admitting: *Deleted

## 2019-09-16 NOTE — Patient Outreach (Signed)
[facility]) Care Management  09/16/2019  AMARYS SLIWINSKI Jun 29, 1936 [id number]    Telephone Assessment  RN spoke with pt's granddaughter Veterinary surgeon) concerning the previous conversation with the pt and in-home aide last week. RN states the purpose for the call today was to completed enrollment with information to gather for the initial assessment. Caregiver works main job and very limited with availability. RN inquired on a convenient time and date to completed the initial enrollment due to the limited knowledge from the in home sitter and pt's limitations.  Caregiver requested a call back on next Monday and aware of the information needed due to another relative involved with [place] services in the past. Caregiver very receptive to services for this pt. Will follow up as requested next week.  Raina Mina, RN Care Management Coordinator [place] Office [phone]

## 2019-09-17 DIAGNOSIS — E890 Postprocedural hypothyroidism: Secondary | ICD-10-CM | POA: Diagnosis not present

## 2019-09-18 ENCOUNTER — Telehealth: Payer: Self-pay

## 2019-09-18 NOTE — Telephone Encounter (Signed)
Aware. Thank you.

## 2019-09-18 NOTE — Telephone Encounter (Signed)
Regina Harris PT with [facility] left v/m tht pt had missed visit today due to visiting family member. Since pt is doing so well will not add a visit to end of [facility] PT orders. FYI to Dr Glori Bickers and Regina Harris does not need cb.

## 2019-09-21 ENCOUNTER — Ambulatory Visit: Payer: Medicare Other | Admitting: Podiatry

## 2019-09-21 ENCOUNTER — Other Ambulatory Visit: Payer: Self-pay | Admitting: *Deleted

## 2019-09-21 DIAGNOSIS — J1289 Other viral pneumonia: Secondary | ICD-10-CM | POA: Diagnosis not present

## 2019-09-21 DIAGNOSIS — E039 Hypothyroidism, unspecified: Secondary | ICD-10-CM | POA: Diagnosis not present

## 2019-09-21 DIAGNOSIS — F0390 Unspecified dementia without behavioral disturbance: Secondary | ICD-10-CM | POA: Diagnosis not present

## 2019-09-21 DIAGNOSIS — G9341 Metabolic encephalopathy: Secondary | ICD-10-CM | POA: Diagnosis not present

## 2019-09-21 DIAGNOSIS — E119 Type 2 diabetes mellitus without complications: Secondary | ICD-10-CM | POA: Diagnosis not present

## 2019-09-21 DIAGNOSIS — U071 COVID-19: Secondary | ICD-10-CM | POA: Diagnosis not present

## 2019-09-21 NOTE — Patient Outreach (Addendum)
[place] [facility]) Care Management  09/21/2019  BERGEN MELLE 11/10/1935 [id number]   Telephone Assessment-Initial Assessment completed  RN spoke with pt's daughter (Crsytal) and completed the initial assessment. Daughter indicated pt is currently receiving PT in the home for strengthening exercises due to risk for falls. Other areas of interested was the request for an A.D packet. Will send education letter along with an A.D packet.    RN discussed the plan of care related to long and short term goals that have been recently generated. Caregiver will update pt's progress on the next call according to the interventions discussed today. Daughter very grateful for the involved indicating her father was in the program in the past. No other inquires or request at this time.  Will follow up with another outreach in the later part of December.    Raina Mina, RN Care Management Coordinator [place] Office [phone]

## 2019-09-23 DIAGNOSIS — F0390 Unspecified dementia without behavioral disturbance: Secondary | ICD-10-CM | POA: Diagnosis not present

## 2019-09-23 DIAGNOSIS — G9341 Metabolic encephalopathy: Secondary | ICD-10-CM | POA: Diagnosis not present

## 2019-09-23 DIAGNOSIS — E039 Hypothyroidism, unspecified: Secondary | ICD-10-CM | POA: Diagnosis not present

## 2019-09-23 DIAGNOSIS — J1289 Other viral pneumonia: Secondary | ICD-10-CM | POA: Diagnosis not present

## 2019-09-23 DIAGNOSIS — U071 COVID-19: Secondary | ICD-10-CM | POA: Diagnosis not present

## 2019-09-23 DIAGNOSIS — E119 Type 2 diabetes mellitus without complications: Secondary | ICD-10-CM | POA: Diagnosis not present

## 2019-09-25 DIAGNOSIS — U071 COVID-19: Secondary | ICD-10-CM | POA: Diagnosis not present

## 2019-09-25 DIAGNOSIS — F0390 Unspecified dementia without behavioral disturbance: Secondary | ICD-10-CM | POA: Diagnosis not present

## 2019-09-25 DIAGNOSIS — G9341 Metabolic encephalopathy: Secondary | ICD-10-CM | POA: Diagnosis not present

## 2019-09-25 DIAGNOSIS — J1289 Other viral pneumonia: Secondary | ICD-10-CM | POA: Diagnosis not present

## 2019-09-25 DIAGNOSIS — E119 Type 2 diabetes mellitus without complications: Secondary | ICD-10-CM | POA: Diagnosis not present

## 2019-09-25 DIAGNOSIS — E039 Hypothyroidism, unspecified: Secondary | ICD-10-CM | POA: Diagnosis not present

## 2019-09-27 DIAGNOSIS — G9341 Metabolic encephalopathy: Secondary | ICD-10-CM | POA: Diagnosis not present

## 2019-09-27 DIAGNOSIS — U071 COVID-19: Secondary | ICD-10-CM | POA: Diagnosis not present

## 2019-09-27 DIAGNOSIS — D696 Thrombocytopenia, unspecified: Secondary | ICD-10-CM | POA: Diagnosis not present

## 2019-09-27 DIAGNOSIS — D751 Secondary polycythemia: Secondary | ICD-10-CM | POA: Diagnosis not present

## 2019-09-27 DIAGNOSIS — Z9981 Dependence on supplemental oxygen: Secondary | ICD-10-CM | POA: Diagnosis not present

## 2019-09-27 DIAGNOSIS — I10 Essential (primary) hypertension: Secondary | ICD-10-CM | POA: Diagnosis not present

## 2019-09-27 DIAGNOSIS — F17210 Nicotine dependence, cigarettes, uncomplicated: Secondary | ICD-10-CM | POA: Diagnosis not present

## 2019-09-27 DIAGNOSIS — M6281 Muscle weakness (generalized): Secondary | ICD-10-CM | POA: Diagnosis not present

## 2019-09-27 DIAGNOSIS — J1289 Other viral pneumonia: Secondary | ICD-10-CM | POA: Diagnosis not present

## 2019-09-27 DIAGNOSIS — Z7982 Long term (current) use of aspirin: Secondary | ICD-10-CM | POA: Diagnosis not present

## 2019-09-27 DIAGNOSIS — Z87440 Personal history of urinary (tract) infections: Secondary | ICD-10-CM | POA: Diagnosis not present

## 2019-09-27 DIAGNOSIS — Z9181 History of falling: Secondary | ICD-10-CM | POA: Diagnosis not present

## 2019-09-27 DIAGNOSIS — R262 Difficulty in walking, not elsewhere classified: Secondary | ICD-10-CM | POA: Diagnosis not present

## 2019-09-27 DIAGNOSIS — K2270 Barrett's esophagus without dysplasia: Secondary | ICD-10-CM | POA: Diagnosis not present

## 2019-09-27 DIAGNOSIS — E039 Hypothyroidism, unspecified: Secondary | ICD-10-CM | POA: Diagnosis not present

## 2019-09-27 DIAGNOSIS — F0390 Unspecified dementia without behavioral disturbance: Secondary | ICD-10-CM | POA: Diagnosis not present

## 2019-09-27 DIAGNOSIS — E119 Type 2 diabetes mellitus without complications: Secondary | ICD-10-CM | POA: Diagnosis not present

## 2019-09-27 DIAGNOSIS — E785 Hyperlipidemia, unspecified: Secondary | ICD-10-CM | POA: Diagnosis not present

## 2019-09-28 ENCOUNTER — Encounter: Payer: Self-pay | Admitting: *Deleted

## 2019-09-28 DIAGNOSIS — E119 Type 2 diabetes mellitus without complications: Secondary | ICD-10-CM | POA: Diagnosis not present

## 2019-09-28 DIAGNOSIS — E039 Hypothyroidism, unspecified: Secondary | ICD-10-CM | POA: Diagnosis not present

## 2019-09-28 DIAGNOSIS — F0390 Unspecified dementia without behavioral disturbance: Secondary | ICD-10-CM | POA: Diagnosis not present

## 2019-09-28 DIAGNOSIS — U071 COVID-19: Secondary | ICD-10-CM | POA: Diagnosis not present

## 2019-09-28 DIAGNOSIS — J1289 Other viral pneumonia: Secondary | ICD-10-CM | POA: Diagnosis not present

## 2019-09-28 DIAGNOSIS — G9341 Metabolic encephalopathy: Secondary | ICD-10-CM | POA: Diagnosis not present

## 2019-09-28 NOTE — Telephone Encounter (Signed)
This encounter was created in error - please disregard.

## 2019-09-29 DIAGNOSIS — U071 COVID-19: Secondary | ICD-10-CM | POA: Diagnosis not present

## 2019-09-29 DIAGNOSIS — F0390 Unspecified dementia without behavioral disturbance: Secondary | ICD-10-CM | POA: Diagnosis not present

## 2019-09-29 DIAGNOSIS — J1289 Other viral pneumonia: Secondary | ICD-10-CM | POA: Diagnosis not present

## 2019-09-29 DIAGNOSIS — E039 Hypothyroidism, unspecified: Secondary | ICD-10-CM | POA: Diagnosis not present

## 2019-09-29 DIAGNOSIS — E119 Type 2 diabetes mellitus without complications: Secondary | ICD-10-CM | POA: Diagnosis not present

## 2019-09-29 DIAGNOSIS — G9341 Metabolic encephalopathy: Secondary | ICD-10-CM | POA: Diagnosis not present

## 2019-09-30 ENCOUNTER — Telehealth: Payer: Self-pay

## 2019-09-30 NOTE — Telephone Encounter (Signed)
Regina Harris, [facility] Pt called to omit this week's appointment and move it to the end of his plan of care due to him not being available this week. Just needs verbal order. Please call and can leave a message on [phone]

## 2019-09-30 NOTE — Telephone Encounter (Signed)
Regina Harris has been contacted and notified of these verbal orders. Thanks!

## 2019-09-30 NOTE — Telephone Encounter (Signed)
Please verbally ok that order  Thanks  

## 2019-10-01 DIAGNOSIS — F0390 Unspecified dementia without behavioral disturbance: Secondary | ICD-10-CM | POA: Diagnosis not present

## 2019-10-01 DIAGNOSIS — G9341 Metabolic encephalopathy: Secondary | ICD-10-CM | POA: Diagnosis not present

## 2019-10-01 DIAGNOSIS — J1289 Other viral pneumonia: Secondary | ICD-10-CM | POA: Diagnosis not present

## 2019-10-01 DIAGNOSIS — U071 COVID-19: Secondary | ICD-10-CM | POA: Diagnosis not present

## 2019-10-01 DIAGNOSIS — E039 Hypothyroidism, unspecified: Secondary | ICD-10-CM | POA: Diagnosis not present

## 2019-10-01 DIAGNOSIS — E119 Type 2 diabetes mellitus without complications: Secondary | ICD-10-CM | POA: Diagnosis not present

## 2019-10-05 DIAGNOSIS — F0390 Unspecified dementia without behavioral disturbance: Secondary | ICD-10-CM | POA: Diagnosis not present

## 2019-10-05 DIAGNOSIS — J1289 Other viral pneumonia: Secondary | ICD-10-CM | POA: Diagnosis not present

## 2019-10-05 DIAGNOSIS — E119 Type 2 diabetes mellitus without complications: Secondary | ICD-10-CM | POA: Diagnosis not present

## 2019-10-05 DIAGNOSIS — G9341 Metabolic encephalopathy: Secondary | ICD-10-CM | POA: Diagnosis not present

## 2019-10-05 DIAGNOSIS — E039 Hypothyroidism, unspecified: Secondary | ICD-10-CM | POA: Diagnosis not present

## 2019-10-05 DIAGNOSIS — U071 COVID-19: Secondary | ICD-10-CM | POA: Diagnosis not present

## 2019-10-06 DIAGNOSIS — G9341 Metabolic encephalopathy: Secondary | ICD-10-CM | POA: Diagnosis not present

## 2019-10-06 DIAGNOSIS — E119 Type 2 diabetes mellitus without complications: Secondary | ICD-10-CM | POA: Diagnosis not present

## 2019-10-06 DIAGNOSIS — U071 COVID-19: Secondary | ICD-10-CM | POA: Diagnosis not present

## 2019-10-06 DIAGNOSIS — J1289 Other viral pneumonia: Secondary | ICD-10-CM | POA: Diagnosis not present

## 2019-10-06 DIAGNOSIS — F0390 Unspecified dementia without behavioral disturbance: Secondary | ICD-10-CM | POA: Diagnosis not present

## 2019-10-06 DIAGNOSIS — E039 Hypothyroidism, unspecified: Secondary | ICD-10-CM | POA: Diagnosis not present

## 2019-10-07 ENCOUNTER — Encounter: Payer: Self-pay | Admitting: *Deleted

## 2019-10-07 ENCOUNTER — Telehealth: Payer: Self-pay

## 2019-10-07 DIAGNOSIS — U071 COVID-19: Secondary | ICD-10-CM | POA: Diagnosis not present

## 2019-10-07 DIAGNOSIS — E119 Type 2 diabetes mellitus without complications: Secondary | ICD-10-CM | POA: Diagnosis not present

## 2019-10-07 DIAGNOSIS — F0390 Unspecified dementia without behavioral disturbance: Secondary | ICD-10-CM | POA: Diagnosis not present

## 2019-10-07 DIAGNOSIS — E039 Hypothyroidism, unspecified: Secondary | ICD-10-CM | POA: Diagnosis not present

## 2019-10-07 DIAGNOSIS — G9341 Metabolic encephalopathy: Secondary | ICD-10-CM | POA: Diagnosis not present

## 2019-10-07 DIAGNOSIS — J1289 Other viral pneumonia: Secondary | ICD-10-CM | POA: Diagnosis not present

## 2019-10-07 NOTE — Telephone Encounter (Signed)
Please ok those verbal orders  

## 2019-10-07 NOTE — Telephone Encounter (Signed)
Regina Harris PT with [name] left v/m requesting to extend plan of care for [facility] PT orders 1 x a wk for 1 wk starting the wk of 10/12/19; 2 x a wk for 1 wk due to knee pain.

## 2019-10-07 NOTE — Telephone Encounter (Signed)
Left VM giving verbal orders  

## 2019-10-07 NOTE — Telephone Encounter (Signed)
This encounter was created in error - please disregard.

## 2019-10-08 DIAGNOSIS — U071 COVID-19: Secondary | ICD-10-CM | POA: Diagnosis not present

## 2019-10-08 DIAGNOSIS — G9341 Metabolic encephalopathy: Secondary | ICD-10-CM | POA: Diagnosis not present

## 2019-10-08 DIAGNOSIS — F0390 Unspecified dementia without behavioral disturbance: Secondary | ICD-10-CM | POA: Diagnosis not present

## 2019-10-08 DIAGNOSIS — J1289 Other viral pneumonia: Secondary | ICD-10-CM | POA: Diagnosis not present

## 2019-10-08 DIAGNOSIS — E039 Hypothyroidism, unspecified: Secondary | ICD-10-CM | POA: Diagnosis not present

## 2019-10-08 DIAGNOSIS — E119 Type 2 diabetes mellitus without complications: Secondary | ICD-10-CM | POA: Diagnosis not present

## 2019-10-14 DIAGNOSIS — G9341 Metabolic encephalopathy: Secondary | ICD-10-CM | POA: Diagnosis not present

## 2019-10-14 DIAGNOSIS — F0390 Unspecified dementia without behavioral disturbance: Secondary | ICD-10-CM | POA: Diagnosis not present

## 2019-10-14 DIAGNOSIS — E039 Hypothyroidism, unspecified: Secondary | ICD-10-CM | POA: Diagnosis not present

## 2019-10-14 DIAGNOSIS — E119 Type 2 diabetes mellitus without complications: Secondary | ICD-10-CM | POA: Diagnosis not present

## 2019-10-14 DIAGNOSIS — J1289 Other viral pneumonia: Secondary | ICD-10-CM | POA: Diagnosis not present

## 2019-10-14 DIAGNOSIS — U071 COVID-19: Secondary | ICD-10-CM | POA: Diagnosis not present

## 2019-10-15 ENCOUNTER — Ambulatory Visit: Payer: Medicare Other | Admitting: Podiatry

## 2019-10-21 DIAGNOSIS — U071 COVID-19: Secondary | ICD-10-CM | POA: Diagnosis not present

## 2019-10-21 DIAGNOSIS — J1289 Other viral pneumonia: Secondary | ICD-10-CM | POA: Diagnosis not present

## 2019-10-21 DIAGNOSIS — G9341 Metabolic encephalopathy: Secondary | ICD-10-CM | POA: Diagnosis not present

## 2019-10-21 DIAGNOSIS — F0390 Unspecified dementia without behavioral disturbance: Secondary | ICD-10-CM | POA: Diagnosis not present

## 2019-10-21 DIAGNOSIS — E039 Hypothyroidism, unspecified: Secondary | ICD-10-CM | POA: Diagnosis not present

## 2019-10-21 DIAGNOSIS — E119 Type 2 diabetes mellitus without complications: Secondary | ICD-10-CM | POA: Diagnosis not present

## 2019-10-23 DIAGNOSIS — J1289 Other viral pneumonia: Secondary | ICD-10-CM | POA: Diagnosis not present

## 2019-10-23 DIAGNOSIS — F0390 Unspecified dementia without behavioral disturbance: Secondary | ICD-10-CM | POA: Diagnosis not present

## 2019-10-23 DIAGNOSIS — E119 Type 2 diabetes mellitus without complications: Secondary | ICD-10-CM | POA: Diagnosis not present

## 2019-10-23 DIAGNOSIS — G9341 Metabolic encephalopathy: Secondary | ICD-10-CM | POA: Diagnosis not present

## 2019-10-23 DIAGNOSIS — E039 Hypothyroidism, unspecified: Secondary | ICD-10-CM | POA: Diagnosis not present

## 2019-10-23 DIAGNOSIS — U071 COVID-19: Secondary | ICD-10-CM | POA: Diagnosis not present

## 2019-10-27 ENCOUNTER — Other Ambulatory Visit: Payer: Self-pay | Admitting: *Deleted

## 2019-10-27 NOTE — Patient Outreach (Signed)
[facility]) Care Management  10/27/2019  Regina Harris [birth year] [id number]    Telephone Assessment  RN attempted outreach call however unsuccessful. RN unable to leave a message at this time.  Plan: RN will continue attempts with outreach calls  Raina Mina, RN Care Management Coordinator [place] Office [phone]

## 2019-11-05 ENCOUNTER — Other Ambulatory Visit: Payer: Self-pay | Admitting: *Deleted

## 2019-11-05 NOTE — Patient Outreach (Signed)
[facility]) Care Management  11/05/2019  Regina Harris 1936/09/04 [id number]    Telephone Assessment-Successful  RN spoke with pt today and received an update on pt's ongoing plan of care and how she continues to manage her care. Discuss the current plan of care in place with some short term goals met. Will reiterated on the importance of avoid re-hospitalization by incorporating prevention measures and contacting providers with early interventions. Will continue to offer community resources as pt continue to manage her ongoing care.   Plan: Will continue to communicate with her provider and follow up on a quarterly contact based upon pt's ongoing progress in managing her care.   THN CM Care Plan Problem One     Most Recent Value  Care Plan Problem One  Preventive Measures related to recent SNF d/c  Role Documenting the Problem One  Care Management Telephonic Coordinator  Care Plan for Problem One  Active  THN Long Term Goal   Pt will not have any readmission to a hospital or SNF over the next 60 days.  THN Long Term Goal Start Date  09/07/19  Interventions for Problem One Long Term Goal  Will continue to stress the improtance or reaching out to her providers or the 24 hr RN [facility] hotline on possible interventions to avoid re-hospitalization if her medical issues can be resolved telephonically with her medical providers. Will continue to reassess pt's adherence with this goal and plan of care.  THN CM Short Term Goal #1   Adherence with medications post op SNF.  THN CM Short Term Goal #1 Start Date  09/07/19  [facility] CM Short Term Goal #1 Met Date  11/05/19  THN CM Short Term Goal #2   Adherence with all medical appointments  [facility] CM Short Term Goal #2 Start Date  09/07/19  [facility] CM Short Term Goal #2 Met Date  11/05/19        Raina Mina, RN Care Management Coordinator [place] Office [phone]

## 2019-11-10 ENCOUNTER — Telehealth: Payer: Self-pay | Admitting: Family Medicine

## 2019-11-10 ENCOUNTER — Other Ambulatory Visit (INDEPENDENT_AMBULATORY_CARE_PROVIDER_SITE_OTHER): Payer: Medicare Other

## 2019-11-10 ENCOUNTER — Other Ambulatory Visit: Payer: Self-pay

## 2019-11-10 DIAGNOSIS — R8290 Unspecified abnormal findings in urine: Secondary | ICD-10-CM

## 2019-11-10 DIAGNOSIS — Z87440 Personal history of urinary (tract) infections: Secondary | ICD-10-CM | POA: Diagnosis not present

## 2019-11-10 DIAGNOSIS — R5383 Other fatigue: Secondary | ICD-10-CM

## 2019-11-10 LAB — POC URINALSYSI DIPSTICK (AUTOMATED)
Bilirubin, UA: NEGATIVE
Blood, UA: NEGATIVE
Glucose, UA: NEGATIVE
Ketones, UA: NEGATIVE
Nitrite, UA: NEGATIVE
Protein, UA: POSITIVE — AB
Spec Grav, UA: 1.02 (ref 1.010–1.025)
Urobilinogen, UA: 0.2 E.U./dL
pH, UA: 6 (ref 5.0–8.0)

## 2019-11-10 NOTE — Telephone Encounter (Signed)
Patient's daughter returned call Advised of Dr Marliss Coots message.

## 2019-11-10 NOTE — Telephone Encounter (Signed)
Please run a UA or have home care do it if she has home care at this time Thanks

## 2019-11-10 NOTE — Telephone Encounter (Signed)
Patient's granddaughter called today requesting an Order for urinalysis  She stated that she is having to Urinate frequently, she is Sleeping a lot since Saturday, and a lot of fatigue.  [place] daughter said the patient gets these Symptoms when she is getting a uti. They are wanting to just come by to get a specimen cup to have her urine tested.

## 2019-11-10 NOTE — Telephone Encounter (Signed)
Left VM letting grand daughter know Dr. Marliss Coots comments regarding leaving a urine sample

## 2019-11-12 ENCOUNTER — Other Ambulatory Visit: Payer: Self-pay | Admitting: Family Medicine

## 2019-11-12 LAB — URINE CULTURE
MICRO NUMBER:: 10008508
SPECIMEN QUALITY:: ADEQUATE

## 2019-11-12 MED ORDER — NITROFURANTOIN MONOHYD MACRO 100 MG PO CAPS: 100.0000 mg | ORAL_CAPSULE | Freq: Two times a day (BID) | ORAL | 0 refills | Status: DC

## 2019-11-12 NOTE — Telephone Encounter (Signed)
Pt's grand daughter [name] notified of urine cx results and Rx sent to pharmacy

## 2019-11-12 NOTE — Telephone Encounter (Signed)
UC pos for uti  I px macrobid to take twice daily  Please send to pharmacy of choice   F/u if no imp in symptoms

## 2019-11-17 ENCOUNTER — Inpatient Hospital Stay
Admission: EM | Admit: 2019-11-17 | Discharge: 2019-11-20 | DRG: 824 | Disposition: A | Discharge status: Home / Self Care | Payer: Medicare Other | Attending: Family Medicine | Admitting: Family Medicine

## 2019-11-17 ENCOUNTER — Emergency Department: Payer: Medicare Other

## 2019-11-17 ENCOUNTER — Other Ambulatory Visit: Payer: Self-pay

## 2019-11-17 ENCOUNTER — Telehealth: Payer: Self-pay

## 2019-11-17 DIAGNOSIS — E7800 Pure hypercholesterolemia, unspecified: Secondary | ICD-10-CM | POA: Diagnosis present

## 2019-11-17 DIAGNOSIS — Z79890 Hormone replacement therapy (postmenopausal): Secondary | ICD-10-CM

## 2019-11-17 DIAGNOSIS — M799 Soft tissue disorder, unspecified: Secondary | ICD-10-CM | POA: Diagnosis not present

## 2019-11-17 DIAGNOSIS — M7989 Other specified soft tissue disorders: Secondary | ICD-10-CM

## 2019-11-17 DIAGNOSIS — D696 Thrombocytopenia, unspecified: Secondary | ICD-10-CM | POA: Diagnosis present

## 2019-11-17 DIAGNOSIS — Z8051 Family history of malignant neoplasm of kidney: Secondary | ICD-10-CM | POA: Diagnosis not present

## 2019-11-17 DIAGNOSIS — C8590 Non-Hodgkin lymphoma, unspecified, unspecified site: Principal | ICD-10-CM | POA: Diagnosis present

## 2019-11-17 DIAGNOSIS — Z8616 Personal history of COVID-19: Secondary | ICD-10-CM

## 2019-11-17 DIAGNOSIS — R1032 Left lower quadrant pain: Secondary | ICD-10-CM | POA: Diagnosis not present

## 2019-11-17 DIAGNOSIS — R222 Localized swelling, mass and lump, trunk: Secondary | ICD-10-CM | POA: Diagnosis present

## 2019-11-17 DIAGNOSIS — Z833 Family history of diabetes mellitus: Secondary | ICD-10-CM

## 2019-11-17 DIAGNOSIS — N1330 Unspecified hydronephrosis: Secondary | ICD-10-CM | POA: Diagnosis present

## 2019-11-17 DIAGNOSIS — J90 Pleural effusion, not elsewhere classified: Secondary | ICD-10-CM | POA: Diagnosis not present

## 2019-11-17 DIAGNOSIS — E119 Type 2 diabetes mellitus without complications: Secondary | ICD-10-CM | POA: Diagnosis present

## 2019-11-17 DIAGNOSIS — E890 Postprocedural hypothyroidism: Secondary | ICD-10-CM | POA: Diagnosis present

## 2019-11-17 DIAGNOSIS — E785 Hyperlipidemia, unspecified: Secondary | ICD-10-CM | POA: Diagnosis present

## 2019-11-17 DIAGNOSIS — Z7982 Long term (current) use of aspirin: Secondary | ICD-10-CM | POA: Diagnosis not present

## 2019-11-17 DIAGNOSIS — N135 Crossing vessel and stricture of ureter without hydronephrosis: Secondary | ICD-10-CM | POA: Diagnosis not present

## 2019-11-17 DIAGNOSIS — U071 COVID-19: Secondary | ICD-10-CM | POA: Diagnosis not present

## 2019-11-17 DIAGNOSIS — F0390 Unspecified dementia without behavioral disturbance: Secondary | ICD-10-CM | POA: Diagnosis present

## 2019-11-17 DIAGNOSIS — C755 Malignant neoplasm of aortic body and other paraganglia: Secondary | ICD-10-CM

## 2019-11-17 DIAGNOSIS — K449 Diaphragmatic hernia without obstruction or gangrene: Secondary | ICD-10-CM | POA: Diagnosis present

## 2019-11-17 DIAGNOSIS — J9589 Other postprocedural complications and disorders of respiratory system, not elsewhere classified: Secondary | ICD-10-CM | POA: Diagnosis not present

## 2019-11-17 DIAGNOSIS — K219 Gastro-esophageal reflux disease without esophagitis: Secondary | ICD-10-CM | POA: Diagnosis present

## 2019-11-17 DIAGNOSIS — K5732 Diverticulitis of large intestine without perforation or abscess without bleeding: Secondary | ICD-10-CM | POA: Diagnosis not present

## 2019-11-17 DIAGNOSIS — K5730 Diverticulosis of large intestine without perforation or abscess without bleeding: Secondary | ICD-10-CM | POA: Diagnosis not present

## 2019-11-17 DIAGNOSIS — N179 Acute kidney failure, unspecified: Secondary | ICD-10-CM | POA: Diagnosis present

## 2019-11-17 DIAGNOSIS — I10 Essential (primary) hypertension: Secondary | ICD-10-CM | POA: Diagnosis present

## 2019-11-17 DIAGNOSIS — N289 Disorder of kidney and ureter, unspecified: Secondary | ICD-10-CM

## 2019-11-17 DIAGNOSIS — Z03818 Encounter for observation for suspected exposure to other biological agents ruled out: Secondary | ICD-10-CM | POA: Diagnosis not present

## 2019-11-17 DIAGNOSIS — R1909 Other intra-abdominal and pelvic swelling, mass and lump: Secondary | ICD-10-CM | POA: Diagnosis not present

## 2019-11-17 DIAGNOSIS — Z419 Encounter for procedure for purposes other than remedying health state, unspecified: Secondary | ICD-10-CM

## 2019-11-17 DIAGNOSIS — E039 Hypothyroidism, unspecified: Secondary | ICD-10-CM | POA: Diagnosis not present

## 2019-11-17 DIAGNOSIS — J9811 Atelectasis: Secondary | ICD-10-CM | POA: Diagnosis not present

## 2019-11-17 DIAGNOSIS — R590 Localized enlarged lymph nodes: Secondary | ICD-10-CM | POA: Diagnosis not present

## 2019-11-17 DIAGNOSIS — K5792 Diverticulitis of intestine, part unspecified, without perforation or abscess without bleeding: Secondary | ICD-10-CM

## 2019-11-17 DIAGNOSIS — Z87891 Personal history of nicotine dependence: Secondary | ICD-10-CM | POA: Diagnosis not present

## 2019-11-17 LAB — COMPREHENSIVE METABOLIC PANEL
ALT: 7 U/L (ref 0–44)
AST: 15 U/L (ref 15–41)
Albumin: 3.4 g/dL — ABNORMAL LOW (ref 3.5–5.0)
Alkaline Phosphatase: 61 U/L (ref 38–126)
Anion gap: 10 (ref 5–15)
BUN: 16 mg/dL (ref 8–23)
CO2: 26 mmol/L (ref 22–32)
Calcium: 9.3 mg/dL (ref 8.9–10.3)
Chloride: 104 mmol/L (ref 98–111)
Creatinine, Ser: 1.69 mg/dL — ABNORMAL HIGH (ref 0.44–1.00)
GFR calc Af Amer: 32 mL/min — ABNORMAL LOW (ref >60)
GFR calc non Af Amer: 28 mL/min — ABNORMAL LOW (ref >60)
Glucose, Bld: 128 mg/dL — ABNORMAL HIGH (ref 70–99)
Potassium: 4.4 mmol/L (ref 3.5–5.1)
Sodium: 140 mmol/L (ref 135–145)
Total Bilirubin: 0.6 mg/dL (ref 0.3–1.2)
Total Protein: 7.6 g/dL (ref 6.5–8.1)

## 2019-11-17 LAB — URINALYSIS, COMPLETE (UACMP) WITH MICROSCOPIC
Bacteria, UA: NONE SEEN
Bilirubin Urine: NEGATIVE
Glucose, UA: NEGATIVE mg/dL
Hgb urine dipstick: NEGATIVE
Ketones, ur: NEGATIVE mg/dL
Leukocytes,Ua: NEGATIVE
Nitrite: NEGATIVE
Protein, ur: NEGATIVE mg/dL
Specific Gravity, Urine: 1.011 (ref 1.005–1.030)
pH: 5 (ref 5.0–8.0)

## 2019-11-17 LAB — CBC
HCT: 44.2 % (ref 36.0–46.0)
Hemoglobin: 14.3 g/dL (ref 12.0–15.0)
MCH: 31.5 pg (ref 26.0–34.0)
MCHC: 32.4 g/dL (ref 30.0–36.0)
MCV: 97.4 fL (ref 80.0–100.0)
Platelets: 156 10*3/uL (ref 150–400)
RBC: 4.54 MIL/uL (ref 3.87–5.11)
RDW: 11.6 % (ref 11.5–15.5)
WBC: 6.4 10*3/uL (ref 4.0–10.5)
nRBC: 0 % (ref 0.0–0.2)

## 2019-11-17 LAB — LIPASE, BLOOD: Lipase: 111 U/L — ABNORMAL HIGH (ref 11–51)

## 2019-11-17 MED ORDER — ACETAMINOPHEN 325 MG PO TABS: 650.0000 mg | ORAL_TABLET | Freq: Four times a day (QID) | ORAL | Status: DC | PRN

## 2019-11-17 MED ORDER — CALCIUM CARBONATE-VITAMIN D 500-200 MG-UNIT PO TABS
1.0000 | ORAL_TABLET | Freq: Two times a day (BID) | ORAL | Status: DC
  Administered 2019-11-18 – 2019-11-20 (×2): 1 via ORAL
  Filled 2019-11-17 (×5): qty 1

## 2019-11-17 MED ORDER — ONDANSETRON HCL 4 MG/2ML IJ SOLN
4.0000 mg | Freq: Four times a day (QID) | INTRAMUSCULAR | Status: DC | PRN
  Administered 2019-11-18: 4 mg via INTRAVENOUS
  Filled 2019-11-17 (×2): qty 2

## 2019-11-17 MED ORDER — SODIUM CHLORIDE 0.9% FLUSH: 3.0000 mL | Freq: Once | INTRAVENOUS | Status: DC

## 2019-11-17 MED ORDER — HEPARIN SODIUM (PORCINE) 5000 UNIT/ML IJ SOLN: 5000.0000 [IU] | Freq: Three times a day (TID) | INTRAMUSCULAR | Status: DC

## 2019-11-17 MED ORDER — ZINC SULFATE 220 (50 ZN) MG PO CAPS
220.0000 mg | ORAL_CAPSULE | Freq: Every day | ORAL | Status: DC
  Administered 2019-11-18 – 2019-11-20 (×2): 220 mg via ORAL
  Filled 2019-11-17 (×3): qty 1

## 2019-11-17 MED ORDER — SODIUM CHLORIDE 0.9 % IV BOLUS: 1000.0000 mL | Freq: Once | INTRAVENOUS | Status: DC

## 2019-11-17 MED ORDER — VITAMIN B-12 1000 MCG PO TABS
1000.0000 ug | ORAL_TABLET | Freq: Every day | ORAL | Status: DC
  Administered 2019-11-18 – 2019-11-20 (×2): 1000 ug via ORAL
  Filled 2019-11-17 (×2): qty 1

## 2019-11-17 MED ORDER — METRONIDAZOLE 500 MG PO TABS
500.0000 mg | ORAL_TABLET | Freq: Once | ORAL | Status: AC
  Administered 2019-11-17: 500 mg via ORAL
  Filled 2019-11-17: qty 1

## 2019-11-17 MED ORDER — AMOXICILLIN-POT CLAVULANATE 875-125 MG PO TABS
1.0000 | ORAL_TABLET | Freq: Once | ORAL | Status: AC
  Administered 2019-11-17: 1 via ORAL
  Filled 2019-11-17: qty 1

## 2019-11-17 MED ORDER — LORATADINE 10 MG PO TABS
10.0000 mg | ORAL_TABLET | Freq: Every day | ORAL | Status: DC
  Administered 2019-11-18 – 2019-11-20 (×2): 10 mg via ORAL
  Filled 2019-11-17 (×2): qty 1

## 2019-11-17 MED ORDER — PANTOPRAZOLE SODIUM 40 MG PO TBEC
40.0000 mg | DELAYED_RELEASE_TABLET | Freq: Every day | ORAL | Status: DC
  Administered 2019-11-18 – 2019-11-20 (×2): 40 mg via ORAL
  Filled 2019-11-17 (×2): qty 1

## 2019-11-17 MED ORDER — SODIUM CHLORIDE 0.9 % IV BOLUS
1000.0000 mL | Freq: Once | INTRAVENOUS | Status: AC
  Administered 2019-11-17: 1000 mL via INTRAVENOUS

## 2019-11-17 MED ORDER — DONEPEZIL HCL 5 MG PO TABS
5.0000 mg | ORAL_TABLET | Freq: Every day | ORAL | Status: DC
  Administered 2019-11-18 – 2019-11-19 (×3): 5 mg via ORAL
  Filled 2019-11-17 (×3): qty 1

## 2019-11-17 MED ORDER — ATORVASTATIN CALCIUM 10 MG PO TABS
10.0000 mg | ORAL_TABLET | Freq: Every day | ORAL | Status: DC
  Administered 2019-11-18 – 2019-11-19 (×2): 10 mg via ORAL
  Filled 2019-11-17 (×2): qty 1

## 2019-11-17 MED ORDER — MORPHINE SULFATE (PF) 2 MG/ML IV SOLN
2.0000 mg | Freq: Once | INTRAVENOUS | Status: AC
  Administered 2019-11-17: 2 mg via INTRAVENOUS
  Filled 2019-11-17: qty 1

## 2019-11-17 MED ORDER — OXYCODONE-ACETAMINOPHEN 5-325 MG PO TABS
1.0000 | ORAL_TABLET | Freq: Once | ORAL | Status: AC
  Administered 2019-11-17: 1 via ORAL
  Filled 2019-11-17: qty 1

## 2019-11-17 MED ORDER — ONDANSETRON HCL 4 MG/2ML IJ SOLN
4.0000 mg | Freq: Once | INTRAMUSCULAR | Status: AC
  Administered 2019-11-17: 4 mg via INTRAVENOUS
  Filled 2019-11-17: qty 2

## 2019-11-17 MED ORDER — SENNOSIDES-DOCUSATE SODIUM 8.6-50 MG PO TABS: 1.0000 | ORAL_TABLET | Freq: Every evening | ORAL | Status: DC | PRN

## 2019-11-17 MED ORDER — ACETAMINOPHEN 650 MG RE SUPP: 650.0000 mg | Freq: Four times a day (QID) | RECTAL | Status: DC | PRN

## 2019-11-17 MED ORDER — LEVOTHYROXINE SODIUM 50 MCG PO TABS
75.0000 ug | ORAL_TABLET | Freq: Every day | ORAL | Status: DC
  Administered 2019-11-18: 10:00:00 75 ug via ORAL
  Filled 2019-11-17: qty 2

## 2019-11-17 MED ORDER — HYDROCODONE-ACETAMINOPHEN 5-325 MG PO TABS
1.0000 | ORAL_TABLET | Freq: Four times a day (QID) | ORAL | Status: DC | PRN
  Administered 2019-11-18: 1 via ORAL
  Filled 2019-11-17: qty 1

## 2019-11-17 MED ORDER — ALBUTEROL SULFATE (2.5 MG/3ML) 0.083% IN NEBU: 2.5000 mg | INHALATION_SOLUTION | Freq: Four times a day (QID) | RESPIRATORY_TRACT | Status: DC | PRN

## 2019-11-17 MED ORDER — SODIUM CHLORIDE 0.9 % IV SOLN
INTRAVENOUS | Status: DC
  Administered 2019-11-18: 1000 mL via INTRAVENOUS

## 2019-11-17 MED ORDER — ASPIRIN EC 81 MG PO TBEC
81.0000 mg | DELAYED_RELEASE_TABLET | Freq: Every day | ORAL | Status: DC
  Administered 2019-11-18 – 2019-11-20 (×2): 81 mg via ORAL
  Filled 2019-11-17 (×2): qty 1

## 2019-11-17 MED ORDER — ONDANSETRON HCL 4 MG PO TABS: 4.0000 mg | ORAL_TABLET | Freq: Four times a day (QID) | ORAL | Status: DC | PRN

## 2019-11-17 NOTE — ED Provider Notes (Signed)
[facility] Emergency Department Provider Note  ____________________________________________   First MD Initiated Contact with Patient 11/17/19 1734     (approximate)  I have reviewed the triage vital signs and the nursing notes.   HISTORY  Chief Complaint Abdominal Pain    HPI Regina Harris is a 84 y.o. female with below list of previous medical conditions including recently diagnosed urinary tract infection for which the patient has been taking Macrobid for the past 5 days Presents to the emergency department secondary to left lower quadrant abdominal pain with current pain score of 6 out of 10.  Patient admits to associated loose stools today however no vomiting or diarrhea.  Patient denies any fever afebrile on presentation.        Past Medical History:  Diagnosis Date  . Barrett esophagus   . Carotid bruit   . Diabetes mellitus without complication ([place])   . Esophagitis   . Gastritis 2013  . GERD (gastroesophageal reflux disease)   . HH (hiatus hernia)   . History of colonic polyps   . History of repair of right rotator cuff   . Hyperlipidemia   . Hypertension   . Hyperthyroidism 06/09/2018   Per [facility] RAI Therapy for Hyperthyroidism order  . Lump or mass in breast   . Pneumonia   . Polycythemia, secondary   . Tobacco abuse     Patient Active Problem List   Diagnosis Date Noted  . Acute metabolic encephalopathy 40/98/1191  . Acute lower UTI 07/29/2019  . Pneumonia due to COVID-19 virus 07/29/2019  . COVID-19 virus infection 07/29/2019  . UTI (urinary tract infection) 07/27/2019  . Nasal congestion 07/27/2019  . B12 deficiency 01/05/2019  . Memory loss 12/30/2018  . Poor balance 12/30/2018  . Low back pain 10/14/2018  . Fatigue 10/01/2018  . Abnormal urinalysis 10/01/2018  . 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 Right leg swelling 06/09/2018  . Right knee pain 06/09/2018  . Baker's cyst of knee, right 06/09/2018  .  Encounter for screening mammogram for breast cancer 02/27/2017  . Estrogen deficiency 02/27/2017  . Foot pain, bilateral 02/27/2017  . Hypothyroidism 04/16/2016  . Pedal edema 04/05/2016  . Right rotator cuff tear 03/09/2016  . Hypokalemia 03/01/2016  . Nodule of chest wall 10/06/2014  . Left breast mass 06/30/2014  . Encounter for Medicare annual wellness exam 02/22/2014  . Polycythemia, secondary 02/22/2014  . GERD (gastroesophageal reflux disease) 08/24/2013  . Lump or mass in breast 05/06/2013  . Post-menopausal 06/24/2012  . Obesity 12/24/2011  . Prediabetes 03/12/2011  . NEOPLASM UNSPECIFIED NATURE DIGESTIVE SYSTEM 07/24/2010  . LUNG NODULE 01/16/2010  . HYPERCHOLESTEROLEMIA, PURE 07/10/2007  . Former smoker 03/19/2007  . Essential hypertension 03/19/2007  . Carotid bruit 03/19/2007  . COLONIC POLYPS, HX OF 03/19/2007    Past Surgical History:  Procedure Laterality Date  . ABDOMINAL HYSTERECTOMY    . APPENDECTOMY    . BREAST BIOPSY Right 1996  . BREAST BIOPSY Left 10/09/2012   Benign breast tissue with focal fat necrosis and focal periductal chronic inflammation.  Marland Kitchen BREAST BIOPSY Left 10/09/2012   Benign breast tissue with focal fat necrosis and focal periductal chronic inflammation.  Marland Kitchen CATARACT EXTRACTION  3/11   Dr Charise Killian  . CHOLECYSTECTOMY    . COLONOSCOPY  2009  . ESOPHAGOGASTRODUODENOSCOPY (EGD) WITH PROPOFOL N/A 11/02/2015   Procedure: ESOPHAGOGASTRODUODENOSCOPY (EGD) WITH PROPOFOL;  Surgeon: Manya Silvas, MD;  Location: [facility];  Service: Endoscopy;  Laterality: N/A;  . SHOULDER  ARTHROSCOPY WITH OPEN ROTATOR CUFF REPAIR Right 03/05/2016   Procedure: SHOULDER ARTHROSCOPY WITH OPEN ROTATOR CUFF REPAIR;  Surgeon: Earnestine Leys, MD;  Location: [facility] ORS;  Service: Orthopedics;  Laterality: Right;  . UPPER GI ENDOSCOPY  2013    Prior to Admission medications   Medication Sig Start Date End Date Taking? Authorizing Provider  aspirin EC 81 MG tablet Take 81  mg by mouth daily.    [provider]  atorvastatin (LIPITOR) 10 MG tablet TAKE 1 TABLET BY MOUTH ONCE DAILY 08/04/19   Tower, Wynelle Fanny, MD  Calcium Carb-Cholecalciferol (CALCIUM 1000 + D PO) Take 1 capsule by mouth daily.    [provider]  cyanocobalamin 1000 MCG tablet Take 1,000 mcg by mouth daily.    [provider]  donepezil (ARICEPT) 5 MG tablet Take 1 tablet (5 mg total) by mouth at bedtime. 12/30/18   Tower, Wynelle Fanny, MD  furosemide (LASIX) 20 MG tablet TAKE 1 TABLET BY MOUTH ONCE A DAY 07/22/19   Tower, Wynelle Fanny, MD  gabapentin (NEURONTIN) 400 MG capsule TAKE 1 CAPSULE BY MOUTH TWICE DAILY 08/04/19   Tower, Wynelle Fanny, MD  hydrochlorothiazide (HYDRODIURIL) 25 MG tablet Take 1 tablet (25 mg total) by mouth daily. Patient not taking: Reported on 09/21/2019 02/28/18   Tower, Wynelle Fanny, MD  levothyroxine (SYNTHROID) 75 MCG tablet Take 1 tablet by mouth daily with breakfast. 07/21/19   [provider]  loratadine (CLARITIN) 10 MG tablet Take 10 mg by mouth daily.    [provider]  nitrofurantoin, macrocrystal-monohydrate, (MACROBID) 100 MG capsule Take 1 capsule (100 mg total) by mouth 2 (two) times daily. 11/12/19   Tower, Wynelle Fanny, MD  omeprazole (PRILOSEC) 20 MG capsule TAKE 1 CAPSULE BY MOUTH TWICE DAILY 07/22/19   Tower, Wynelle Fanny, MD  potassium chloride SA (K-DUR) 20 MEQ tablet TAKE 1 TABLET BY MOUTH TWICE (2) DAILY 08/04/19   Tower, Wynelle Fanny, MD  vitamin C (VITAMIN C) 500 MG tablet Take 1 tablet (500 mg total) by mouth 2 (two) times daily. Patient not taking: Reported on 09/21/2019 08/06/19   Caren Griffins, MD  zinc sulfate 220 (50 Zn) MG capsule Take 1 capsule (220 mg total) by mouth daily. Patient not taking: Reported on 09/21/2019 08/07/19   Caren Griffins, MD  potassium chloride (K-DUR) 10 MEQ tablet Take 1 tablet (10 mEq total) by mouth daily. 01/04/14 05/02/14  Tower, Wynelle Fanny, MD    Allergies Patient has no known allergies.  Family History   Problem Relation Age of Onset  . Kidney cancer Father   . Diabetes Mother   . Breast cancer Neg Hx     Social History Social History   Tobacco Use  . Smoking status: Former Smoker    Packs/day: 1.00    Types: Cigarettes  . Smokeless tobacco: Never Used  Substance Use Topics  . Alcohol use: No    Alcohol/week: 0.0 standard drinks  . Drug use: No    Review of Systems Constitutional: No fever/chills Eyes: No visual changes. ENT: No sore throat. Cardiovascular: Denies chest pain. Respiratory: Denies shortness of breath. Gastrointestinal: Positive for abdominal pain and loose stools.  No constipation. Genitourinary: Negative for dysuria. Musculoskeletal: Negative for neck pain.  Negative for back pain. Integumentary: Negative for rash. Neurological: Negative for headaches, focal weakness or numbness.   ____________________________________________   PHYSICAL EXAM:  VITAL SIGNS: ED Triage Vitals  Enc Vitals Group     BP 11/17/19 1617 (!) 147/67  Pulse Rate 11/17/19 1617 68     Resp 11/17/19 1617 18     Temp 11/17/19 1617 97.9 F (36.6 C)     Temp Source 11/17/19 1617 Oral     SpO2 11/17/19 1617 95 %     Weight 11/17/19 1618 90.7 kg (200 lb)     Height 11/17/19 1618 1.702 m (5\' 7" )     Head Circumference --      Peak Flow --      Pain Score 11/17/19 1618 10     Pain Loc --      Pain Edu? --      Excl. in [place]? --     Constitutional: Alert and oriented.  Eyes: Conjunctivae are normal.  Mouth/Throat: Patient is wearing a mask. Neck: No stridor.  No meningeal signs.   Cardiovascular: Normal rate, regular rhythm. Good peripheral circulation. Grossly normal heart sounds. Respiratory: Normal respiratory effort.  No retractions. Gastrointestinal: Left lower quadrant tenderness to palpation.. No distention.  Musculoskeletal: No lower extremity tenderness nor edema. No gross deformities of extremities. Neurologic:  Normal speech and language. No gross focal  neurologic deficits are appreciated.  Skin:  Skin is warm, dry and intact. Psychiatric: Mood and affect are normal. Speech and behavior are normal.  ____________________________________________   LABS (all labs ordered are listed, but only abnormal results are displayed)  Labs Reviewed  LIPASE, BLOOD - Abnormal; Notable for the following components:      Result Value   Lipase 111 (*)    All other components within normal limits  COMPREHENSIVE METABOLIC PANEL - Abnormal; Notable for the following components:   Glucose, Bld 128 (*)    Creatinine, Ser 1.69 (*)    Albumin 3.4 (*)    GFR calc non Af Amer 28 (*)    GFR calc Af Amer 32 (*)    All other components within normal limits  URINALYSIS, COMPLETE (UACMP) WITH MICROSCOPIC - Abnormal; Notable for the following components:   Color, Urine YELLOW (*)    APPearance HAZY (*)    All other components within normal limits  SARS CORONAVIRUS 2 (TAT 6-24 HRS)  CBC   _________________________________________  RADIOLOGY I, [place] N , personally viewed and evaluated these images (plain radiographs) as part of my medical decision making, as well as reviewing the written report by the radiologist.  ED MD interpretation: Sigmoid diverticulosis with findings concerning for diverticulitis.  Infiltrative soft tissue mass along the lower thoracic spine T10-T11 with encasement of the aorta also noted.  Official radiology report(s): CT ABDOMEN PELVIS WO CONTRAST  Result Date: 11/17/2019 CLINICAL DATA:  84 year old female with left-sided abdominal pain. Concern for acute diverticulitis. EXAM: CT ABDOMEN AND PELVIS WITHOUT CONTRAST TECHNIQUE: Multidetector CT imaging of the abdomen and pelvis was performed following the standard protocol without IV contrast. COMPARISON:  CT abdomen pelvis dated 06/13/2006. FINDINGS: Evaluation of this exam is limited in the absence of intravenous contrast. Lower chest: Trace left pleural effusion. There is  thickened appearance of the posteromedial lower lobe pleural surfaces. Partially visualized infiltrative mass encasing the distal descending thoracic aorta and abutting the lower thoracic spine at T10-T11. This may represent an infiltrative neoplasm, lymphoma, or fibrosis versus sequela osteomyelitis/discitis. This is new since the CT of 07/17/2010. Further evaluation with MRI without and with contrast is recommended. Bibasilar subpleural linear and streaky densities may represent atelectasis/scarring. Infiltrate is less likely. Clinical correlation is recommended. There is no intra-abdominal free air or free fluid. Hepatobiliary: The liver is unremarkable. No  intrahepatic biliary ductal dilatation. Cholecystectomy. No retained calcified stone noted in the central CBD. Pancreas: There is mild haziness of the distal pancreas which may represent acute pancreatitis. Correlation with pancreatic enzymes recommended. No drainable fluid collection/abscess or pseudocyst Spleen: Normal in size without focal abnormality. Adrenals/Urinary Tract: There is a 2 cm left adrenal adenoma. The right adrenal gland is unremarkable. There is mild left hydronephrosis. No calcified stone identified. There is haziness and stranding of the proximal left periureteric fat which may represent an infection/inflammatory process or fibrosis and possible degree of ureteral stricture. There is a 2 cm left renal inferior pole hypodense lesion which is not characterized but demonstrates fluid attenuation most consistent with a cyst. There is no hydronephrosis or nephrolithiasis on the right. Multiple right renal cysts measure up to approximately 7 cm. The right ureter and urinary bladder appear unremarkable. Stomach/Bowel: There is sigmoid diverticulosis with muscular hypertrophy. Mild perisigmoid stranding and haziness may represent mild acute diverticulitis. Clinical correlation is recommended. There is diffuse thickened appearance of the colon  which may be related to underdistention. Diffuse submucosal fat deposit along the colonic wall likely sequela of chronic inflammatory process. There is no bowel obstruction. Appendectomy. Vascular/Lymphatic: Advanced aortoiliac atherosclerotic disease. There is stranding of the periaortic fat. There is a 3.8 x 2.1 cm left para-aortic/paraspinal soft tissue lesion which is not characterized but may represent enlarged lymph node (series 2 image 38). However, this soft tissue appears to abut the left L4 vertebra and therefore a neoplasm or an infectious process is not excluded. No portal venous gas. The IVC is unremarkable. Reproductive: Hysterectomy. Other: None Musculoskeletal: Osteopenia with extensive multilevel degenerative changes, disc desiccation and vacuum phenomena. Old-appearing multilevel compression fractures most prominent involving L3 and L4. No definite acute fracture. No retropulsed fragment. IMPRESSION: 1. Sigmoid diverticulosis with findings concerning for mild diverticulitis. No diverticular abscess or perforation. 2. No bowel obstruction. 3. Mild left hydronephrosis, possibly secondary to a degree of stricture or decreased peristalsis of the left ureter due to adjacent inflammatory process or fibrosis. No stone. 4. Mild stranding of the distal pancreas. Correlation with pancreatic enzymes recommended to evaluate for acute pancreatitis. No fluid collection. 5. Infiltrative soft tissue mass along the lower thoracic spine at T10-T11 with encasement of the aorta. This may represent an inflammatory/infectious process (osteomyelitis/discitis) or an infiltrative neoplasm. Further evaluation with MRI without and with contrast is recommended. 6. Thickened appearance of the inferior medial lower lobe pleural surfaces which may be secondary to infiltration of paraspinal/periaortic mass or sequela of chronic inflammation. 7. Left para-aortic soft tissue density to the left of L4 may represent adenopathy or a  neoplasm. Electronically Signed   By: Anner Crete M.D.   On: 11/17/2019 18:09    Procedures   ____________________________________________   INITIAL IMPRESSION / MDM / [place] / ED COURSE  As part of my medical decision making, I reviewed the following data within the [place] NUMBER   84 year old female presented with above-stated history and physical exam a differential diagnosis including but not limited to diverticulitis colitis.  CT scan findings consistent with diverticulitis.  As such patient given Augmentin and 75 mg and Flagyl 500 mg p.o.  In addition also noted was an infiltrative mass that is encasing the aorta at T10-T11.  Laboratory data consistent with acute renal insufficiency given creatinine of 1.69 has increased from 0.89 in November 2020.  Patient given 1 L IV normal saline in the emergency department as well as morphine 2 mg  IV and Zofran 4 mg IV with improvement of discomfort at this time.  Patient discussed with Dr. Janese Banks oncologist who will consult on the patient.  Patient discussed with hospitalist for hospital admission for further evaluation and management.       ____________________________________________  FINAL CLINICAL IMPRESSION(S) / ED DIAGNOSES  Final diagnoses:  Acute diverticulitis  Acute renal insufficiency  Soft tissue mass     MEDICATIONS GIVEN DURING THIS VISIT:  Medications  morphine 2 MG/ML injection 2 mg (2 mg Intravenous Given 11/17/19 1805)  ondansetron (ZOFRAN) injection 4 mg (4 mg Intravenous Given 11/17/19 1805)  amoxicillin-clavulanate (AUGMENTIN) 875-125 MG per tablet 1 tablet (1 tablet Oral Given 11/17/19 1843)  metroNIDAZOLE (FLAGYL) tablet 500 mg (500 mg Oral Given 11/17/19 1843)  oxyCODONE-acetaminophen (PERCOCET/ROXICET) 5-325 MG per tablet 1 tablet (1 tablet Oral Given 11/17/19 1843)  sodium chloride 0.9 % bolus 1,000 mL (1,000 mLs Intravenous New Bag/Given 11/17/19 1855)     ED Discharge Orders     None      *Please note:  Regina Harris was evaluated in Emergency Department on 11/17/2019 for the symptoms described in the history of present illness. She was evaluated in the context of the global COVID-19 pandemic, which necessitated consideration that the patient might be at risk for infection with the SARS-CoV-2 virus that causes COVID-19. Institutional protocols and algorithms that pertain to the evaluation of patients at risk for COVID-19 are in a state of rapid change based on information released by regulatory bodies including the CDC and federal and state organizations. These policies and algorithms were followed during the patient's care in the ED.  Some ED evaluations and interventions may be delayed as a result of limited staffing during the pandemic.*  Note:  This document was prepared using Dragon voice recognition software and may include unintentional dictation errors.   Gregor Hams, MD 11/17/19 1942

## 2019-11-17 NOTE — ED Notes (Signed)
Patient resting in bed with granddaughter at bedside. Patient is comfortable with call light in reach.

## 2019-11-17 NOTE — Telephone Encounter (Signed)
Sherry nurse with Access nurse is triaging this pt; pt had sudden onset of lower lt abd pain, tender to touch, loose stools and disposition is to be seen within 4 hours. No available appts at [facility] and advised Judeen Hammans for pt to go to [facility] or ED. Judeen Hammans agrees with disposition of UC or ED and will advise pts granddaughter. FYI to Dr Glori Bickers.

## 2019-11-17 NOTE — ED Triage Notes (Signed)
Pt is here with her grand daughter, states she has been having LLQ pain that started today. Denies N/V/D.

## 2019-11-17 NOTE — ED Notes (Signed)
Pt placed in a hospital bed and IV re taped per family and pt request. Pt tolerated well. Pt decides to sleep in her sweat clothes and not a gown. Family at bedside. Pt is calm and cooperative. Pt requesting something to eat.

## 2019-11-17 NOTE — Telephone Encounter (Signed)
Team health note did not come over portal. Copy of THnote sent for scanning and copy put on Dr Glori Bickers s in box. Per chart review pt did go to [facility] ED.

## 2019-11-17 NOTE — Telephone Encounter (Signed)
Agree with advisement  I will watch for notes /correspondence

## 2019-11-18 ENCOUNTER — Encounter: Payer: Self-pay | Admitting: Internal Medicine

## 2019-11-18 ENCOUNTER — Inpatient Hospital Stay: Payer: Medicare Other

## 2019-11-18 DIAGNOSIS — N179 Acute kidney failure, unspecified: Secondary | ICD-10-CM

## 2019-11-18 DIAGNOSIS — Z87891 Personal history of nicotine dependence: Secondary | ICD-10-CM

## 2019-11-18 DIAGNOSIS — Z8051 Family history of malignant neoplasm of kidney: Secondary | ICD-10-CM

## 2019-11-18 DIAGNOSIS — N289 Disorder of kidney and ureter, unspecified: Secondary | ICD-10-CM

## 2019-11-18 DIAGNOSIS — D696 Thrombocytopenia, unspecified: Secondary | ICD-10-CM

## 2019-11-18 DIAGNOSIS — J9589 Other postprocedural complications and disorders of respiratory system, not elsewhere classified: Secondary | ICD-10-CM

## 2019-11-18 LAB — PROTEIN / CREATININE RATIO, URINE
Creatinine, Urine: 69 mg/dL
Protein Creatinine Ratio: 1.97 mg/mg{Cre} — ABNORMAL HIGH (ref 0.00–0.15)
Total Protein, Urine: 136 mg/dL

## 2019-11-18 LAB — COMPREHENSIVE METABOLIC PANEL
ALT: 7 U/L (ref 0–44)
AST: 13 U/L — ABNORMAL LOW (ref 15–41)
Albumin: 2.9 g/dL — ABNORMAL LOW (ref 3.5–5.0)
Alkaline Phosphatase: 49 U/L (ref 38–126)
Anion gap: 8 (ref 5–15)
BUN: 15 mg/dL (ref 8–23)
CO2: 23 mmol/L (ref 22–32)
Calcium: 8.6 mg/dL — ABNORMAL LOW (ref 8.9–10.3)
Chloride: 106 mmol/L (ref 98–111)
Creatinine, Ser: 1.76 mg/dL — ABNORMAL HIGH (ref 0.44–1.00)
GFR calc Af Amer: 31 mL/min — ABNORMAL LOW (ref >60)
GFR calc non Af Amer: 27 mL/min — ABNORMAL LOW (ref >60)
Glucose, Bld: 122 mg/dL — ABNORMAL HIGH (ref 70–99)
Potassium: 4 mmol/L (ref 3.5–5.1)
Sodium: 137 mmol/L (ref 135–145)
Total Bilirubin: 0.6 mg/dL (ref 0.3–1.2)
Total Protein: 6.4 g/dL — ABNORMAL LOW (ref 6.5–8.1)

## 2019-11-18 LAB — CBC
HCT: 42.7 % (ref 36.0–46.0)
Hemoglobin: 13.5 g/dL (ref 12.0–15.0)
MCH: 30.8 pg (ref 26.0–34.0)
MCHC: 31.6 g/dL (ref 30.0–36.0)
MCV: 97.3 fL (ref 80.0–100.0)
Platelets: 140 10*3/uL — ABNORMAL LOW (ref 150–400)
RBC: 4.39 MIL/uL (ref 3.87–5.11)
RDW: 11.5 % (ref 11.5–15.5)
WBC: 6.6 10*3/uL (ref 4.0–10.5)
nRBC: 0 % (ref 0.0–0.2)

## 2019-11-18 LAB — SARS CORONAVIRUS 2 (TAT 6-24 HRS): SARS Coronavirus 2: NEGATIVE

## 2019-11-18 LAB — CREATININE, URINE, RANDOM: Creatinine, Urine: 67 mg/dL

## 2019-11-18 LAB — SODIUM, URINE, RANDOM: Sodium, Ur: 120 mmol/L

## 2019-11-18 MED ORDER — SODIUM CHLORIDE 0.9 % IV BOLUS
500.0000 mL | Freq: Once | INTRAVENOUS | Status: AC
  Administered 2019-11-18: 500 mL via INTRAVENOUS

## 2019-11-18 MED ORDER — ACETAMINOPHEN 500 MG PO TABS
1000.0000 mg | ORAL_TABLET | Freq: Three times a day (TID) | ORAL | Status: DC
  Administered 2019-11-18 – 2019-11-20 (×6): 1000 mg via ORAL
  Filled 2019-11-18 (×7): qty 2

## 2019-11-18 MED ORDER — AMOXICILLIN 500 MG PO CAPS
500.0000 mg | ORAL_CAPSULE | Freq: Three times a day (TID) | ORAL | Status: DC
  Administered 2019-11-18: 10:00:00 500 mg via ORAL
  Filled 2019-11-18: qty 1

## 2019-11-18 MED ORDER — HYDROMORPHONE HCL 1 MG/ML IJ SOLN
0.5000 mg | INTRAMUSCULAR | Status: DC | PRN
  Administered 2019-11-18: 0.5 mg via INTRAVENOUS
  Filled 2019-11-18: qty 1

## 2019-11-18 MED ORDER — OXYCODONE HCL 5 MG PO TABS
5.0000 mg | ORAL_TABLET | ORAL | Status: DC | PRN
  Administered 2019-11-18 – 2019-11-20 (×3): 5 mg via ORAL
  Filled 2019-11-18 (×3): qty 1

## 2019-11-18 NOTE — ED Notes (Signed)
Pt placed on 2L n/c for O2 sat 89% after Dilaudid.

## 2019-11-18 NOTE — ED Notes (Signed)
[place] daughter went home.

## 2019-11-18 NOTE — ED Notes (Signed)
Called to give report. Informed Charge RN looking over patient's chart.

## 2019-11-18 NOTE — Progress Notes (Signed)
PROGRESS NOTE    Regina Harris  KVQ:[id number] DOB: [birth year] DOA: 11/17/2019 PCP: Abner Greenspan, MD      Brief Narrative:  Mrs. Lahman is a 84 y.o. F with DM, HTN, hypothyroidism and recent Covid infection, still quite debilitated who presented with acute abdominal pain for 1 hour.  Patient has been slowly recuperating from [place] last October, at home, when she developed severe left lower abdominal pain 1 hour PTA.  In the ER, she was afebrile, heart rate and blood pressure normal.  Creatinine 1.7 from baseline 0.9.  WBC normal.  Hemoglobin stable.  CT of the abdomen and pelvis showed possible mild diverticulitis, mild left hydronephrosis, mild stranding distal pancreas, and infiltrative soft tissue mass along the lower thoracic spine at T10-11 with encasement of the aorta.       Assessment & Plan:  Acute pancreatitis Doubt diverticulitis Lipase 111.  No leukocytosis or fever to suggest diverticulitis. -N.p.o. -IV fluids -Dilaudid for pain, ondansetron for nausea   Para-aortic mass at T10-11 Suspect lymphoma  -Obtain CT chest  -IR consultation  AKI Baseline creatinine 0.9.  Creatinine 1.7 on admission.  Creatinine worsening overnight.  Plan. -Check urine studies -Increase fluids -Strict I/Os -Hold Lasix  Hydronephrosis -Consult Urology re: stent  Recent UTI Enterococcus growing in urine from 1 week ago.  Finished 5 days Macrobid.  Adequate.   Diabetes -Continue aspirin and atorvastatin  Hypertension -Hold diuretic  Hypothyroidism -Continue levothyroxine  Dementia -Continue donepezil  Other medications -Continue loratadine -Continue pantoprazole -Continue B12 supplement        Disposition: The patient was admitted with possible acute pancreatitis versus diverticulitis versus pain from hydronephrosis as well as new para-aortic mass.   I will discharge when the patient has a plan for her hydronephrosis, her renal function is stabilized, and she has  been able to advance to solid diet without pain.        MDM: The below labs and imaging reports were reviewed and summarized above.  Medication management as above.   DVT prophylaxis: SCDs Code Status: Full code Family Communication: Granddaughter at the bedside    Consultants:   Interventional radiology  Urology  Procedures:   1/12 CT abdomen and pelvis  1/13 MRI abdomen    Subjective: Patient is having some left lower quadrant pain radiating into the groin.  No fever.  No dysuria.  No vomiting.  No epigastric pain.  No fever, chills.  No respiratory distress.  No hematuria.  Objective: Vitals:   11/18/19 0030 11/18/19 0400 11/18/19 0600 11/18/19 0737  BP: (!) 145/63 139/69 113/63   Pulse: 64 69 76 79  Resp:  14 16 16   Temp:      TempSrc:      SpO2: 98% 94% 94% 93%  Weight:      Height:       No intake or output data in the 24 hours ending 11/18/19 0807 Filed Weights   11/17/19 1618  Weight: 90.7 kg    Examination: General appearance: Elderly adult female, alert and in no acute distress.   HEENT: Anicteric, conjunctiva pink, lids and lashes normal. No nasal deformity, discharge, epistaxis.  Lips moist.   Skin: Warm and dry.  No jaundice.  No suspicious rashes or lesions. Cardiac: RRR, nl S1-S2, no murmurs appreciated.  Capillary refill is brisk.  JVPnot visible.  No LE edema.  Radial pulses 2+ and symmetric. Respiratory: Normal respiratory rate and rhythm.  CTAB without rales or wheezes. Abdomen: Abdomen soft.  Moderate left lower quadrant tenderness palpation, generalized tenderness with deep palpation, with some voluntary guarding, but overall abdomen benign without rigidity or rebound.  No ascites, distension, hepatosplenomegaly.   MSK: No deformities or effusions. Neuro: Awake and alert.  EOMI, moves all extremities with severe generalized weakness. Speech fluent.    Psych: Sensorium intact and responding to questions, but memory is clearly severely  impaired, states she is "at home", only oriented to self and granddaughter.  Attention diminished, affect wanted.  Judgment and insight appear impaired by dementia.    Data Reviewed: I have personally reviewed following labs and imaging studies:  CBC: Recent Labs  Lab 11/17/19 1626 11/18/19 0630  WBC 6.4 6.6  HGB 14.3 13.5  HCT 44.2 42.7  MCV 97.4 97.3  PLT 156 161*   Basic Metabolic Panel: Recent Labs  Lab 11/17/19 1626 11/18/19 0630  NA 140 137  K 4.4 4.0  CL 104 106  CO2 26 23  GLUCOSE 128* 122*  BUN 16 15  CREATININE 1.69* 1.76*  CALCIUM 9.3 8.6*   GFR: Estimated Creatinine Clearance: 29 mL/min (A) (by C-G formula based on SCr of 1.76 mg/dL (H)). Liver Function Tests: Recent Labs  Lab 11/17/19 1626 11/18/19 0630  AST 15 13*  ALT 7 7  ALKPHOS 61 49  BILITOT 0.6 0.6  PROT 7.6 6.4*  ALBUMIN 3.4* 2.9*   Recent Labs  Lab 11/17/19 1626  LIPASE 111*   No results for input(s): AMMONIA in the last 168 hours. Coagulation Profile: No results for input(s): INR, PROTIME in the last 168 hours. Cardiac Enzymes: No results for input(s): CKTOTAL, CKMB, CKMBINDEX, TROPONINI in the last 168 hours. BNP (last 3 results) No results for input(s): PROBNP in the last 8760 hours. HbA1C: No results for input(s): HGBA1C in the last 72 hours. CBG: No results for input(s): GLUCAP in the last 168 hours. Lipid Profile: No results for input(s): CHOL, HDL, LDLCALC, TRIG, CHOLHDL, LDLDIRECT in the last 72 hours. Thyroid Function Tests: No results for input(s): TSH, T4TOTAL, FREET4, T3FREE, THYROIDAB in the last 72 hours. Anemia Panel: No results for input(s): VITAMINB12, FOLATE, FERRITIN, TIBC, IRON, RETICCTPCT in the last 72 hours. Urine analysis:    Component Value Date/Time   COLORURINE YELLOW (A) 11/17/2019 1626   APPEARANCEUR HAZY (A) 11/17/2019 1626   LABSPEC 1.011 11/17/2019 1626   PHURINE 5.0 11/17/2019 1626   GLUCOSEU NEGATIVE 11/17/2019 1626   HGBUR NEGATIVE  11/17/2019 1626   BILIRUBINUR NEGATIVE 11/17/2019 1626   BILIRUBINUR Negative 11/10/2019 1643   KETONESUR NEGATIVE 11/17/2019 1626   PROTEINUR NEGATIVE 11/17/2019 1626   UROBILINOGEN 0.2 11/10/2019 1643   NITRITE NEGATIVE 11/17/2019 1626   LEUKOCYTESUR NEGATIVE 11/17/2019 1626   Sepsis Labs: @LABRCNTIP (procalcitonin:4,lacticacidven:4)  ) Recent Results (from the past 240 hour(s))  Urine culture     Status: Abnormal   Collection Time: 11/10/19  4:44 PM   Specimen: Urine  Result Value Ref Range Status   MICRO NUMBER: [id number]  Final   SPECIMEN QUALITY: Adequate  Final   Sample Source URINE  Final   STATUS: FINAL  Final   ISOLATE 1: Enterococcus faecalis (A)  Final    Comment: Greater than 100,000 CFU/mL of Enterococcus faecalis      Susceptibility   Enterococcus faecalis - URINE CULTURE POSITIVE 1    AMPICILLIN <=2 Sensitive     VANCOMYCIN 1 Sensitive     NITROFURANTOIN* <=16 Sensitive      * Legend:S = Susceptible  I = IntermediateR = Resistant  NS = Not susceptible* = Not tested  NR = Not reported**NN = See antimicrobic comments  SARS CORONAVIRUS 2 (TAT 6-24 HRS) Nasopharyngeal Nasopharyngeal Swab     Status: None   Collection Time: 11/17/19  7:36 PM   Specimen: Nasopharyngeal Swab  Result Value Ref Range Status   SARS Coronavirus 2 NEGATIVE NEGATIVE Final    Comment: (NOTE) SARS-CoV-2 target nucleic acids are NOT DETECTED. The SARS-CoV-2 RNA is generally detectable in upper and lower respiratory specimens during the acute phase of infection. Negative results do not preclude SARS-CoV-2 infection, do not rule out co-infections with other pathogens, and should not be used as the sole basis for treatment or other patient management decisions. Negative results must be combined with clinical observations, patient history, and epidemiological information. The expected result is Negative. Fact Sheet for Patients: SugarRoll.be Fact Sheet for  Healthcare Providers: https://www.woods-mathews.com/ This test is not yet approved or cleared by the [place] FDA and  has been authorized for detection and/or diagnosis of SARS-CoV-2 by FDA under an Emergency Use Authorization (EUA). This EUA will remain  in effect (meaning this test can be used) for the duration of the COVID-19 declaration under Section 56 4(b)(1) of the Act, 21 U.S.C. section 360bbb-3(b)(1), unless the authorization is terminated or revoked sooner. Performed at [facility], [place] [address] [place], [place] [address]          Radiology Studies: CT ABDOMEN PELVIS WO CONTRAST  Result Date: 11/17/2019 CLINICAL DATA:  84 year old female with left-sided abdominal pain. Concern for acute diverticulitis. EXAM: CT ABDOMEN AND PELVIS WITHOUT CONTRAST TECHNIQUE: Multidetector CT imaging of the abdomen and pelvis was performed following the standard protocol without IV contrast. COMPARISON:  CT abdomen pelvis dated 06/13/2006. FINDINGS: Evaluation of this exam is limited in the absence of intravenous contrast. Lower chest: Trace left pleural effusion. There is thickened appearance of the posteromedial lower lobe pleural surfaces. Partially visualized infiltrative mass encasing the distal descending thoracic aorta and abutting the lower thoracic spine at T10-T11. This may represent an infiltrative neoplasm, lymphoma, or fibrosis versus sequela osteomyelitis/discitis. This is new since the CT of 07/17/2010. Further evaluation with MRI without and with contrast is recommended. Bibasilar subpleural linear and streaky densities may represent atelectasis/scarring. Infiltrate is less likely. Clinical correlation is recommended. There is no intra-abdominal free air or free fluid. Hepatobiliary: The liver is unremarkable. No intrahepatic biliary ductal dilatation. Cholecystectomy. No retained calcified stone noted in the central CBD. Pancreas: There is mild haziness of the  distal pancreas which may represent acute pancreatitis. Correlation with pancreatic enzymes recommended. No drainable fluid collection/abscess or pseudocyst Spleen: Normal in size without focal abnormality. Adrenals/Urinary Tract: There is a 2 cm left adrenal adenoma. The right adrenal gland is unremarkable. There is mild left hydronephrosis. No calcified stone identified. There is haziness and stranding of the proximal left periureteric fat which may represent an infection/inflammatory process or fibrosis and possible degree of ureteral stricture. There is a 2 cm left renal inferior pole hypodense lesion which is not characterized but demonstrates fluid attenuation most consistent with a cyst. There is no hydronephrosis or nephrolithiasis on the right. Multiple right renal cysts measure up to approximately 7 cm. The right ureter and urinary bladder appear unremarkable. Stomach/Bowel: There is sigmoid diverticulosis with muscular hypertrophy. Mild perisigmoid stranding and haziness may represent mild acute diverticulitis. Clinical correlation is recommended. There is diffuse thickened appearance of the colon which may be related to underdistention. Diffuse submucosal fat deposit along the colonic  wall likely sequela of chronic inflammatory process. There is no bowel obstruction. Appendectomy. Vascular/Lymphatic: Advanced aortoiliac atherosclerotic disease. There is stranding of the periaortic fat. There is a 3.8 x 2.1 cm left para-aortic/paraspinal soft tissue lesion which is not characterized but may represent enlarged lymph node (series 2 image 38). However, this soft tissue appears to abut the left L4 vertebra and therefore a neoplasm or an infectious process is not excluded. No portal venous gas. The IVC is unremarkable. Reproductive: Hysterectomy. Other: None Musculoskeletal: Osteopenia with extensive multilevel degenerative changes, disc desiccation and vacuum phenomena. Old-appearing multilevel compression  fractures most prominent involving L3 and L4. No definite acute fracture. No retropulsed fragment. IMPRESSION: 1. Sigmoid diverticulosis with findings concerning for mild diverticulitis. No diverticular abscess or perforation. 2. No bowel obstruction. 3. Mild left hydronephrosis, possibly secondary to a degree of stricture or decreased peristalsis of the left ureter due to adjacent inflammatory process or fibrosis. No stone. 4. Mild stranding of the distal pancreas. Correlation with pancreatic enzymes recommended to evaluate for acute pancreatitis. No fluid collection. 5. Infiltrative soft tissue mass along the lower thoracic spine at T10-T11 with encasement of the aorta. This may represent an inflammatory/infectious process (osteomyelitis/discitis) or an infiltrative neoplasm. Further evaluation with MRI without and with contrast is recommended. 6. Thickened appearance of the inferior medial lower lobe pleural surfaces which may be secondary to infiltration of paraspinal/periaortic mass or sequela of chronic inflammation. 7. Left para-aortic soft tissue density to the left of L4 may represent adenopathy or a neoplasm. Electronically Signed   By: Anner Crete M.D.   On: 11/17/2019 18:09        Scheduled Meds: . aspirin EC  81 mg Oral Daily  . atorvastatin  10 mg Oral Daily  . calcium-vitamin D  1 tablet Oral BID  . donepezil  5 mg Oral QHS  . heparin  5,000 Units Subcutaneous Q8H  . levothyroxine  75 mcg Oral Q breakfast  . loratadine  10 mg Oral Daily  . pantoprazole  40 mg Oral Daily  . cyanocobalamin  1,000 mcg Oral Daily  . zinc sulfate  220 mg Oral Daily   Continuous Infusions: . sodium chloride 1,000 mL (11/18/19 0013)     LOS: 1 day    Time spent: 25 minutes    Edwin Dada, MD [facility] 11/18/2019, 8:07 AM     Please page though [place] or Epic secure chat:  For [employer], Adult nurse

## 2019-11-18 NOTE — ED Notes (Signed)
ED TO INPATIENT HANDOFF REPORT  ED Nurse Name and Phone #: Caryl Pina, RN [phone]  S Name/Age/Gender [name] 84 y.o. female Room/Bed: ED34A/ED34A  Code Status   Code Status: Full Code  Home/SNF/Other Home Patient oriented to: self and place Is this baseline? No   Triage Complete: Triage complete  Chief Complaint AKI (acute kidney injury) ([place]) [N17.9]  Triage Note Pt is here with her grand daughter, states she has been having LLQ pain that started today. Denies N/V/D.     Allergies No Known Allergies  Level of Care/Admitting Diagnosis ED Disposition    ED Disposition Condition [facility] Area: [place] [[id number]]  Level of Care: Med-Surg [16]  Covid Evaluation: Asymptomatic Screening Protocol (No Symptoms)  Diagnosis: AKI (acute kidney injury) [facility]) [[id number]]  Admitting Physician: Clance Boll [[id number]]  Attending Physician: Clance Boll [[id number]]  Estimated length of stay: 3 - 4 days  Certification:: I certify this patient will need inpatient services for at least 2 midnights  Bed request comments: aki/diverticulitis/ abdominal mass encasing aorta       B Medical/Surgery History Past Medical History:  Diagnosis Date  . Barrett esophagus   . Carotid bruit   . Diabetes mellitus without complication ([place])   . Esophagitis   . Gastritis 2013  . GERD (gastroesophageal reflux disease)   . HH (hiatus hernia)   . History of colonic polyps   . History of repair of right rotator cuff   . Hyperlipidemia   . Hypertension   . Hyperthyroidism 06/09/2018   Per [facility] RAI Therapy for Hyperthyroidism order  . Lump or mass in breast   . Pneumonia   . Polycythemia, secondary   . Tobacco abuse    Past Surgical History:  Procedure Laterality Date  . ABDOMINAL HYSTERECTOMY    . APPENDECTOMY    . BREAST BIOPSY Right 1996  . BREAST BIOPSY Left 10/09/2012   Benign breast tissue with focal fat necrosis and focal periductal  chronic inflammation.  Marland Kitchen BREAST BIOPSY Left 10/09/2012   Benign breast tissue with focal fat necrosis and focal periductal chronic inflammation.  Marland Kitchen CATARACT EXTRACTION  3/11   Dr Charise Killian  . CHOLECYSTECTOMY    . COLONOSCOPY  2009  . ESOPHAGOGASTRODUODENOSCOPY (EGD) WITH PROPOFOL N/A 11/02/2015   Procedure: ESOPHAGOGASTRODUODENOSCOPY (EGD) WITH PROPOFOL;  Surgeon: Manya Silvas, MD;  Location: [facility] ENDOSCOPY;  Service: Endoscopy;  Laterality: N/A;  . SHOULDER ARTHROSCOPY WITH OPEN ROTATOR CUFF REPAIR Right 03/05/2016   Procedure: SHOULDER ARTHROSCOPY WITH OPEN ROTATOR CUFF REPAIR;  Surgeon: Earnestine Leys, MD;  Location: [facility] ORS;  Service: Orthopedics;  Laterality: Right;  . UPPER GI ENDOSCOPY  2013     A IV Location/Drains/Wounds Patient Lines/Drains/Airways Status   Active Line/Drains/Airways    Name:   Placement date:   Placement time:   Site:   Days:   Peripheral IV 11/17/19 Right Hand   11/17/19    1800    Hand   1   External Urinary Catheter   07/29/19    1651    --   112   Incision (Closed) 03/05/16 Shoulder Right   03/05/16    1459     1353          Intake/Output Last 24 hours  Intake/Output Summary (Last 24 hours) at 11/18/2019 1429 Last data filed at 11/18/2019 1036 Gross per 24 hour  Intake 500 ml  Output --  Net 500 ml    Labs/Imaging  Results for orders placed or performed during the hospital encounter of 11/17/19 (from the past 48 hour(s))  Lipase, blood     Status: Abnormal   Collection Time: 11/17/19  4:26 PM  Result Value Ref Range   Lipase 111 (H) 11 - 51 U/L    Comment: Performed at [facility], [address] [place], [place] [address]  Comprehensive metabolic panel     Status: Abnormal   Collection Time: 11/17/19  4:26 PM  Result Value Ref Range   Sodium 140 135 - 145 mmol/L   Potassium 4.4 3.5 - 5.1 mmol/L   Chloride 104 98 - 111 mmol/L   CO2 26 22 - 32 mmol/L   Glucose, Bld 128 (H) 70 - 99 mg/dL   BUN 16 8 - 23 mg/dL   Creatinine, Ser  1.69 (H) 0.44 - 1.00 mg/dL   Calcium 9.3 8.9 - 10.3 mg/dL   Total Protein 7.6 6.5 - 8.1 g/dL   Albumin 3.4 (L) 3.5 - 5.0 g/dL   AST 15 15 - 41 U/L   ALT 7 0 - 44 U/L   Alkaline Phosphatase 61 38 - 126 U/L   Total Bilirubin 0.6 0.3 - 1.2 mg/dL   GFR calc non Af Amer 28 (L) >60 mL/min   GFR calc Af Amer 32 (L) >60 mL/min   Anion gap 10 5 - 15    Comment: Performed at [facility], [address] [place], [place] [address]  CBC     Status: None   Collection Time: 11/17/19  4:26 PM  Result Value Ref Range   WBC 6.4 4.0 - 10.5 K/uL   RBC 4.54 3.87 - 5.11 MIL/uL   Hemoglobin 14.3 12.0 - 15.0 g/dL   HCT 44.2 36.0 - 46.0 %   MCV 97.4 80.0 - 100.0 fL   MCH 31.5 26.0 - 34.0 pg   MCHC 32.4 30.0 - 36.0 g/dL   RDW 11.6 11.5 - 15.5 %   Platelets 156 150 - 400 K/uL   nRBC 0.0 0.0 - 0.2 %    Comment: Performed at [facility], [address] [place], [place] [address]  Urinalysis, Complete w Microscopic     Status: Abnormal   Collection Time: 11/17/19  4:26 PM  Result Value Ref Range   Color, Urine YELLOW (A) YELLOW   APPearance HAZY (A) CLEAR   Specific Gravity, Urine 1.011 1.005 - 1.030   pH 5.0 5.0 - 8.0   Glucose, UA NEGATIVE NEGATIVE mg/dL   Hgb urine dipstick NEGATIVE NEGATIVE   Bilirubin Urine NEGATIVE NEGATIVE   Ketones, ur NEGATIVE NEGATIVE mg/dL   Protein, ur NEGATIVE NEGATIVE mg/dL   Nitrite NEGATIVE NEGATIVE   Leukocytes,Ua NEGATIVE NEGATIVE   RBC / HPF 0-5 0 - 5 RBC/hpf   WBC, UA 0-5 0 - 5 WBC/hpf   Bacteria, UA NONE SEEN NONE SEEN   Squamous Epithelial / LPF 0-5 0 - 5   Mucus PRESENT     Comment: Performed at [facility], [address] [place], [place] [address]  Creatinine, urine, random     Status: None   Collection Time: 11/17/19  4:26 PM  Result Value Ref Range   Creatinine, Urine 67 mg/dL    Comment: Performed at [facility], [address] [place], [place] [address]  Sodium, urine, random     Status: None    Collection Time: 11/17/19  4:26 PM  Result Value Ref Range   Sodium, Ur 120 mmol/L  Comment: Performed at [facility], [address] [place], [place] [address]  Protein / creatinine ratio, urine     Status: Abnormal   Collection Time: 11/17/19  4:26 PM  Result Value Ref Range   Creatinine, Urine 69 mg/dL   Total Protein, Urine 136 mg/dL    Comment: NO NORMAL RANGE ESTABLISHED FOR THIS TEST   Protein Creatinine Ratio 1.97 (H) 0.00 - 0.15 mg/mg[Cre]    Comment: Performed at [facility], [place], [place] [address]  SARS CORONAVIRUS 2 (TAT 6-24 HRS) Nasopharyngeal Nasopharyngeal Swab     Status: None   Collection Time: 11/17/19  7:36 PM   Specimen: Nasopharyngeal Swab  Result Value Ref Range   SARS Coronavirus 2 NEGATIVE NEGATIVE    Comment: (NOTE) SARS-CoV-2 target nucleic acids are NOT DETECTED. The SARS-CoV-2 RNA is generally detectable in upper and lower respiratory specimens during the acute phase of infection. Negative results do not preclude SARS-CoV-2 infection, do not rule out co-infections with other pathogens, and should not be used as the sole basis for treatment or other patient management decisions. Negative results must be combined with clinical observations, patient history, and epidemiological information. The expected result is Negative. Fact Sheet for Patients: SugarRoll.be Fact Sheet for Healthcare Providers: https://www.woods-mathews.com/ This test is not yet approved or cleared by the [place] FDA and  has been authorized for detection and/or diagnosis of SARS-CoV-2 by FDA under an Emergency Use Authorization (EUA). This EUA will remain  in effect (meaning this test can be used) for the duration of the COVID-19 declaration under Section 56 4(b)(1) of the Act, 21 U.S.C. section 360bbb-3(b)(1), unless the authorization is terminated or revoked sooner. Performed at [facility], [place] [address], [place], [place] [address]   Comprehensive metabolic panel     Status: Abnormal   Collection Time: 11/18/19  6:30 AM  Result Value Ref Range   Sodium 137 135 - 145 mmol/L   Potassium 4.0 3.5 - 5.1 mmol/L   Chloride 106 98 - 111 mmol/L   CO2 23 22 - 32 mmol/L   Glucose, Bld 122 (H) 70 - 99 mg/dL   BUN 15 8 - 23 mg/dL   Creatinine, Ser 1.76 (H) 0.44 - 1.00 mg/dL   Calcium 8.6 (L) 8.9 - 10.3 mg/dL   Total Protein 6.4 (L) 6.5 - 8.1 g/dL   Albumin 2.9 (L) 3.5 - 5.0 g/dL   AST 13 (L) 15 - 41 U/L   ALT 7 0 - 44 U/L   Alkaline Phosphatase 49 38 - 126 U/L   Total Bilirubin 0.6 0.3 - 1.2 mg/dL   GFR calc non Af Amer 27 (L) >60 mL/min   GFR calc Af Amer 31 (L) >60 mL/min   Anion gap 8 5 - 15    Comment: Performed at [facility], [address] [place], [place] [address]  CBC     Status: Abnormal   Collection Time: 11/18/19  6:30 AM  Result Value Ref Range   WBC 6.6 4.0 - 10.5 K/uL   RBC 4.39 3.87 - 5.11 MIL/uL   Hemoglobin 13.5 12.0 - 15.0 g/dL   HCT 42.7 36.0 - 46.0 %   MCV 97.3 80.0 - 100.0 fL   MCH 30.8 26.0 - 34.0 pg   MCHC 31.6 30.0 - 36.0 g/dL   RDW 11.5 11.5 - 15.5 %   Platelets 140 (L) 150 - 400 K/uL   nRBC 0.0 0.0 - 0.2 %  Comment: Performed at [facility], [address] [place], [place] [address]   CT ABDOMEN PELVIS WO CONTRAST  Result Date: 11/17/2019 CLINICAL DATA:  85 year old female with left-sided abdominal pain. Concern for acute diverticulitis. EXAM: CT ABDOMEN AND PELVIS WITHOUT CONTRAST TECHNIQUE: Multidetector CT imaging of the abdomen and pelvis was performed following the standard protocol without IV contrast. COMPARISON:  CT abdomen pelvis dated 06/13/2006. FINDINGS: Evaluation of this exam is limited in the absence of intravenous contrast. Lower chest: Trace left pleural effusion. There is thickened appearance of the posteromedial lower lobe pleural surfaces. Partially visualized infiltrative mass encasing the  distal descending thoracic aorta and abutting the lower thoracic spine at T10-T11. This may represent an infiltrative neoplasm, lymphoma, or fibrosis versus sequela osteomyelitis/discitis. This is new since the CT of 07/17/2010. Further evaluation with MRI without and with contrast is recommended. Bibasilar subpleural linear and streaky densities may represent atelectasis/scarring. Infiltrate is less likely. Clinical correlation is recommended. There is no intra-abdominal free air or free fluid. Hepatobiliary: The liver is unremarkable. No intrahepatic biliary ductal dilatation. Cholecystectomy. No retained calcified stone noted in the central CBD. Pancreas: There is mild haziness of the distal pancreas which may represent acute pancreatitis. Correlation with pancreatic enzymes recommended. No drainable fluid collection/abscess or pseudocyst Spleen: Normal in size without focal abnormality. Adrenals/Urinary Tract: There is a 2 cm left adrenal adenoma. The right adrenal gland is unremarkable. There is mild left hydronephrosis. No calcified stone identified. There is haziness and stranding of the proximal left periureteric fat which may represent an infection/inflammatory process or fibrosis and possible degree of ureteral stricture. There is a 2 cm left renal inferior pole hypodense lesion which is not characterized but demonstrates fluid attenuation most consistent with a cyst. There is no hydronephrosis or nephrolithiasis on the right. Multiple right renal cysts measure up to approximately 7 cm. The right ureter and urinary bladder appear unremarkable. Stomach/Bowel: There is sigmoid diverticulosis with muscular hypertrophy. Mild perisigmoid stranding and haziness may represent mild acute diverticulitis. Clinical correlation is recommended. There is diffuse thickened appearance of the colon which may be related to underdistention. Diffuse submucosal fat deposit along the colonic wall likely sequela of chronic  inflammatory process. There is no bowel obstruction. Appendectomy. Vascular/Lymphatic: Advanced aortoiliac atherosclerotic disease. There is stranding of the periaortic fat. There is a 3.8 x 2.1 cm left para-aortic/paraspinal soft tissue lesion which is not characterized but may represent enlarged lymph node (series 2 image 38). However, this soft tissue appears to abut the left L4 vertebra and therefore a neoplasm or an infectious process is not excluded. No portal venous gas. The IVC is unremarkable. Reproductive: Hysterectomy. Other: None Musculoskeletal: Osteopenia with extensive multilevel degenerative changes, disc desiccation and vacuum phenomena. Old-appearing multilevel compression fractures most prominent involving L3 and L4. No definite acute fracture. No retropulsed fragment. IMPRESSION: 1. Sigmoid diverticulosis with findings concerning for mild diverticulitis. No diverticular abscess or perforation. 2. No bowel obstruction. 3. Mild left hydronephrosis, possibly secondary to a degree of stricture or decreased peristalsis of the left ureter due to adjacent inflammatory process or fibrosis. No stone. 4. Mild stranding of the distal pancreas. Correlation with pancreatic enzymes recommended to evaluate for acute pancreatitis. No fluid collection. 5. Infiltrative soft tissue mass along the lower thoracic spine at T10-T11 with encasement of the aorta. This may represent an inflammatory/infectious process (osteomyelitis/discitis) or an infiltrative neoplasm. Further evaluation with MRI without and with contrast is recommended. 6. Thickened appearance of the inferior medial lower lobe pleural surfaces  which may be secondary to infiltration of paraspinal/periaortic mass or sequela of chronic inflammation. 7. Left para-aortic soft tissue density to the left of L4 may represent adenopathy or a neoplasm. Electronically Signed   By: Anner Crete M.D.   On: 11/17/2019 18:09   CT CHEST WO CONTRAST  Result  Date: 11/18/2019 CLINICAL DATA:  Adenopathy EXAM: CT CHEST WITHOUT CONTRAST TECHNIQUE: Multidetector CT imaging of the chest was performed following the standard protocol without IV contrast. COMPARISON:  Chest CT 07/17/2010 FINDINGS: Cardiovascular: Normal heart size. No pericardial effusion. Aortic and coronary atherosclerosis. Mediastinum/Nodes: Infiltrative posterior mediastinal mass partially encasing the descending aorta with indistinguishable plane between the mass and the posterior wall of the aorta separate from areas of intimal calcification. The infiltrative abnormality extends approximately 9 cm craniocaudal and 5.6 cm in diameter. The hila and other mediastinal spaces are negative for adenopathy. Lungs/Pleura: Small pleural effusions on both sides. There is subpleural extension of the mass with no visible nodularity along the pleural surface, although limited without contrast. Dependent atelectasis. No worrisome pulmonary nodules. Upper Abdomen: Partial coverage shows left hydronephrosis likely due to the retroperitoneal mass medial to the left psoas on prior CT. Right renal cystic change and atherosclerotic calcification. Cholecystectomy. Musculoskeletal: The posterior mediastinal mass is in close proximity to the spine but does not cause visible erosion or infiltrative lucency. No gross spinal canal mass. No evidence of hematogenous osseous metastases. IMPRESSION: 1. Approximately 9 x 6 cm infiltrative posterior mediastinal mass intimately associated with the descending aorta and thoracic spine. 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 2. Known left hydronephrosis related to infiltrative mass in the retroperitoneum. 3. Small pleural effusions with lower lobe atelectasis. Electronically Signed   By: Monte Fantasia M.D.   On: 11/18/2019 11:27    Pending Labs Unresulted Labs (From admission, onward)    Start     Ordered   11/19/19 0500  Protime-INR  Tomorrow morning,   STAT     11/18/19 1332   11/19/19 0500  PTT  factor inhibitor (mixing study)  Tomorrow morning,   STAT     11/18/19 1332          Vitals/Pain Today's Vitals   11/18/19 0737 11/18/19 0741 11/18/19 0930 11/18/19 1213  BP:    (!) 148/66  Pulse: 79  73 78  Resp: 16  14 20   Temp:    98.8 F (37.1 C)  TempSrc:    Oral  SpO2: 93%  100% 100%  Weight:      Height:      PainSc:  Asleep      Isolation Precautions No active isolations  Medications Medications  0.9 %  sodium chloride infusion ( Intravenous Transfusing/Transfer 11/18/19 1426)  senna-docusate (Senokot-S) tablet 1 tablet (has no administration in time range)  ondansetron (ZOFRAN) tablet 4 mg ( Oral See Alternative 11/18/19 0717)    Or  ondansetron (ZOFRAN) injection 4 mg (4 mg Intravenous Given 11/18/19 0717)  albuterol (PROVENTIL) (2.5 MG/3ML) 0.083% nebulizer solution 2.5 mg (has no administration in time range)  loratadine (CLARITIN) tablet 10 mg (10 mg Oral Given 11/18/19 0936)  aspirin EC tablet 81 mg (81 mg Oral Given 11/18/19 0937)  atorvastatin (LIPITOR) tablet 10 mg (has no administration in time range)  donepezil (ARICEPT) tablet 5 mg (5 mg Oral Given 11/18/19 0012)  levothyroxine (SYNTHROID) tablet 75 mcg (75 mcg Oral Given 11/18/19 0937)  pantoprazole (PROTONIX) EC tablet 40 mg (40 mg Oral Given 11/18/19 0937)  vitamin B-12 (CYANOCOBALAMIN) tablet 1,000 mcg (1,000 mcg  Oral Given 11/18/19 0937)  calcium-vitamin D (OSCAL WITH D) 500-200 MG-UNIT per tablet 1 tablet (1 tablet Oral Not Given 11/18/19 1100)  zinc sulfate capsule 220 mg (220 mg Oral Given 11/18/19 0937)  oxyCODONE (Oxy IR/ROXICODONE) immediate release tablet 5 mg (has no administration in time range)  acetaminophen (TYLENOL) tablet 1,000 mg (1,000 mg Oral Given 11/18/19 1309)  morphine 2 MG/ML injection 2 mg (2 mg Intravenous Given 11/17/19 1805)  ondansetron (ZOFRAN) injection 4 mg (4 mg Intravenous Given 11/17/19 1805)  amoxicillin-clavulanate (AUGMENTIN) 875-125 MG per tablet 1 tablet (1 tablet Oral  Given 11/17/19 1843)  metroNIDAZOLE (FLAGYL) tablet 500 mg (500 mg Oral Given 11/17/19 1843)  oxyCODONE-acetaminophen (PERCOCET/ROXICET) 5-325 MG per tablet 1 tablet (1 tablet Oral Given 11/17/19 1843)  sodium chloride 0.9 % bolus 1,000 mL (0 mLs Intravenous Stopped 11/17/19 2300)  sodium chloride 0.9 % bolus 500 mL (0 mLs Intravenous Stopped 11/18/19 1036)    Mobility walks with device High fall risk   Focused Assessments N/A   R Recommendations: See Admitting Provider Note  Report given to:   Additional Notes: N/A

## 2019-11-18 NOTE — ED Notes (Signed)
Pt with urge to urinate, assisted to [facility] by this tech and Collyn,RN. Pt pants and shirt taken off and placed into a pt belongings bag. Gown placed on pt. Pt given call light and instructed to use it for any needs. Pt has no further needs at this time.

## 2019-11-18 NOTE — ED Notes (Signed)
Oncology to bedside.

## 2019-11-18 NOTE — Consult Note (Signed)
Urology Consult  I have been asked to see the patient by Dr. Loleta Books, for evaluation and management of left hydronephrosis, acute kidney injury.  Chief Complaint: Left lower quadrant pain  History of Present Illness: Regina Harris is a 84 y.o. year old female brought in with acute left lower quadrant pain by her daughter.  She also endorses associated nausea vomiting.  Pain was initially severe.  As part of her further evaluation, she underwent a noncontrast CT scan of the chest abdomen pelvis revealing a left para-aortic mass as well as left hydroureteronephrosis, mild.  Please see report as below.  Patient's creatinine was normal in 09/2019.  On admission, noted to have a rise to 1.69 and remained stably elevated today at 1.76 with a GFR of 27.  She has no personal history of renal failure.  She is also been diagnosed with diverticulitis and pancreatitis.    Patient's daughter also mentions today that she has been treated on multiple occasions this year for urinary tract infection.  She was last treated on 11/09/2018.  Her urine was relatively bland in appearance on this occasion on urinalysis however urine culture did grow Enterococcus.  She completed a course of Macrobid.  Her urine today is completely negative.  When she does have a urinary tract infection, her symptoms are fatigue, malaise, confusion, nonspecific to the bladder.  Patient's daughter is at the bedside and contributes primarily to the above history.  Not currently n.p.o., eating tray of clears.  Past Medical History:  Diagnosis Date  . Barrett esophagus   . Carotid bruit   . Diabetes mellitus without complication ([place])   . Esophagitis   . Gastritis 2013  . GERD (gastroesophageal reflux disease)   . HH (hiatus hernia)   . History of colonic polyps   . History of repair of right rotator cuff   . Hyperlipidemia   . Hypertension   . Hyperthyroidism 06/09/2018   Per [facility] Therapy for Hyperthyroidism order  .  Lump or mass in breast   . Pneumonia   . Polycythemia, secondary   . Tobacco abuse     Past Surgical History:  Procedure Laterality Date  . ABDOMINAL HYSTERECTOMY    . APPENDECTOMY    . BREAST BIOPSY Right 1996  . BREAST BIOPSY Left 10/09/2012   Benign breast tissue with focal fat necrosis and focal periductal chronic inflammation.  Marland Kitchen BREAST BIOPSY Left 10/09/2012   Benign breast tissue with focal fat necrosis and focal periductal chronic inflammation.  Marland Kitchen CATARACT EXTRACTION  3/11   Dr Charise Killian  . CHOLECYSTECTOMY    . COLONOSCOPY  2009  . ESOPHAGOGASTRODUODENOSCOPY (EGD) WITH PROPOFOL N/A 11/02/2015   Procedure: ESOPHAGOGASTRODUODENOSCOPY (EGD) WITH PROPOFOL;  Surgeon: Manya Silvas, MD;  Location: [facility] ENDOSCOPY;  Service: Endoscopy;  Laterality: N/A;  . SHOULDER ARTHROSCOPY WITH OPEN ROTATOR CUFF REPAIR Right 03/05/2016   Procedure: SHOULDER ARTHROSCOPY WITH OPEN ROTATOR CUFF REPAIR;  Surgeon: Earnestine Leys, MD;  Location: [facility] ORS;  Service: Orthopedics;  Laterality: Right;  . UPPER GI ENDOSCOPY  2013    Home Medications:  Current Meds  Medication Sig  . aspirin EC 81 MG tablet Take 81 mg by mouth daily.  Marland Kitchen atorvastatin (LIPITOR) 10 MG tablet TAKE 1 TABLET BY MOUTH ONCE DAILY (Patient taking differently: Take 10 mg by mouth daily. )  . Calcium Carb-Cholecalciferol (CALCIUM 1000 + D) 1000-800 MG-UNIT TABS Take 1 tablet by mouth 2 (two) times daily.   . cyanocobalamin 1000 MCG  tablet Take 1,000 mcg by mouth daily.  Marland Kitchen donepezil (ARICEPT) 5 MG tablet Take 1 tablet (5 mg total) by mouth at bedtime.  . furosemide (LASIX) 20 MG tablet TAKE 1 TABLET BY MOUTH ONCE A DAY (Patient taking differently: Take 20 mg by mouth daily. )  . gabapentin (NEURONTIN) 400 MG capsule TAKE 1 CAPSULE BY MOUTH TWICE DAILY (Patient taking differently: Take 400 mg by mouth 2 (two) times daily. )  . levothyroxine (SYNTHROID) 75 MCG tablet Take 75 mcg by mouth daily with breakfast.   . loratadine (CLARITIN) 10  MG tablet Take 10 mg by mouth daily.  Marland Kitchen omeprazole (PRILOSEC) 20 MG capsule TAKE 1 CAPSULE BY MOUTH TWICE DAILY (Patient taking differently: Take 20 mg by mouth 2 (two) times daily. )  . potassium chloride SA (K-DUR) 20 MEQ tablet TAKE 1 TABLET BY MOUTH TWICE (2) DAILY (Patient taking differently: Take 20 mEq by mouth 2 (two) times daily. )  . zinc sulfate 220 (50 Zn) MG capsule Take 1 capsule (220 mg total) by mouth daily.    Allergies: No Known Allergies  Family History  Problem Relation Age of Onset  . Kidney cancer Father   . Diabetes Mother   . Breast cancer Neg Hx     Social History:  reports that she has quit smoking. Her smoking use included cigarettes. She smoked 1.00 pack per day. She has never used smokeless tobacco. She reports that she does not drink alcohol or use drugs.  ROS: A complete review of systems was performed.  All systems are negative except for pertinent findings as noted.  Physical Exam:  Vital signs in last 24 hours: Temp:  [97.9 F (36.6 C)-98.8 F (37.1 C)] 98.8 F (37.1 C) (01/13 1213) Pulse Rate:  [64-79] 78 (01/13 1213) Resp:  [14-20] 20 (01/13 1213) BP: (113-148)/(62-69) 148/66 (01/13 1213) SpO2:  [93 %-100 %] 100 % (01/13 1213) Weight:  [90.7 kg] 90.7 kg (01/12 1618) Constitutional:  Alert and oriented, No acute distress daughter at bedside.  Answering some questions appropriately. HEENT: [place] AT, moist mucus membranes.  Trachea midline, no masses Respiratory: Normal respiratory effort GI: Abdomen is soft, nontender, nondistended, minimal tenderness in the left lower quadrant without rebound or guarding. Neurologic: Grossly intact, no focal deficits, moving all 4 extremities Psychiatric: Normal mood and affect   Laboratory Data:  Recent Labs    11/17/19 1626 11/18/19 0630  WBC 6.4 6.6  HGB 14.3 13.5  HCT 44.2 42.7   Recent Labs    11/17/19 1626 11/18/19 0630  NA 140 137  K 4.4 4.0  CL 104 106  CO2 26 23  GLUCOSE 128* 122*  BUN  16 15  CREATININE 1.69* 1.76*  CALCIUM 9.3 8.6*   Component     Latest Ref Rng & Units 11/17/2019  Color, Urine     YELLOW YELLOW (A)  Appearance     CLEAR HAZY (A)  Specific Gravity, Urine     1.005 - 1.030 1.011  pH     5.0 - 8.0 5.0  Glucose, UA     NEGATIVE mg/dL NEGATIVE  Hgb urine dipstick     NEGATIVE NEGATIVE  Bilirubin Urine     NEGATIVE NEGATIVE  Ketones, ur     NEGATIVE mg/dL NEGATIVE  Protein     NEGATIVE mg/dL NEGATIVE  Nitrite     NEGATIVE NEGATIVE  Leukocytes,Ua     NEGATIVE NEGATIVE  RBC / HPF     0 - 5 RBC/hpf 0-5  WBC, UA     0 - 5 WBC/hpf 0-5  Bacteria, UA     NONE SEEN NONE SEEN  Squamous Epithelial / LPF     0 - 5 0-5  Mucus      PRESENT    Radiologic Imaging: CT ABDOMEN PELVIS WO CONTRAST  Result Date: 11/17/2019 CLINICAL DATA:  84 year old female with left-sided abdominal pain. Concern for acute diverticulitis. EXAM: CT ABDOMEN AND PELVIS WITHOUT CONTRAST TECHNIQUE: Multidetector CT imaging of the abdomen and pelvis was performed following the standard protocol without IV contrast. COMPARISON:  CT abdomen pelvis dated 06/13/2006. FINDINGS: Evaluation of this exam is limited in the absence of intravenous contrast. Lower chest: Trace left pleural effusion. There is thickened appearance of the posteromedial lower lobe pleural surfaces. Partially visualized infiltrative mass encasing the distal descending thoracic aorta and abutting the lower thoracic spine at T10-T11. This may represent an infiltrative neoplasm, lymphoma, or fibrosis versus sequela osteomyelitis/discitis. This is new since the CT of 07/17/2010. Further evaluation with MRI without and with contrast is recommended. Bibasilar subpleural linear and streaky densities may represent atelectasis/scarring. Infiltrate is less likely. Clinical correlation is recommended. There is no intra-abdominal free air or free fluid. Hepatobiliary: The liver is unremarkable. No intrahepatic biliary ductal  dilatation. Cholecystectomy. No retained calcified stone noted in the central CBD. Pancreas: There is mild haziness of the distal pancreas which may represent acute pancreatitis. Correlation with pancreatic enzymes recommended. No drainable fluid collection/abscess or pseudocyst Spleen: Normal in size without focal abnormality. Adrenals/Urinary Tract: There is a 2 cm left adrenal adenoma. The right adrenal gland is unremarkable. There is mild left hydronephrosis. No calcified stone identified. There is haziness and stranding of the proximal left periureteric fat which may represent an infection/inflammatory process or fibrosis and possible degree of ureteral stricture. There is a 2 cm left renal inferior pole hypodense lesion which is not characterized but demonstrates fluid attenuation most consistent with a cyst. There is no hydronephrosis or nephrolithiasis on the right. Multiple right renal cysts measure up to approximately 7 cm. The right ureter and urinary bladder appear unremarkable. Stomach/Bowel: There is sigmoid diverticulosis with muscular hypertrophy. Mild perisigmoid stranding and haziness may represent mild acute diverticulitis. Clinical correlation is recommended. There is diffuse thickened appearance of the colon which may be related to underdistention. Diffuse submucosal fat deposit along the colonic wall likely sequela of chronic inflammatory process. There is no bowel obstruction. Appendectomy. Vascular/Lymphatic: Advanced aortoiliac atherosclerotic disease. There is stranding of the periaortic fat. There is a 3.8 x 2.1 cm left para-aortic/paraspinal soft tissue lesion which is not characterized but may represent enlarged lymph node (series 2 image 38). However, this soft tissue appears to abut the left L4 vertebra and therefore a neoplasm or an infectious process is not excluded. No portal venous gas. The IVC is unremarkable. Reproductive: Hysterectomy. Other: None Musculoskeletal: Osteopenia  with extensive multilevel degenerative changes, disc desiccation and vacuum phenomena. Old-appearing multilevel compression fractures most prominent involving L3 and L4. No definite acute fracture. No retropulsed fragment. IMPRESSION: 1. Sigmoid diverticulosis with findings concerning for mild diverticulitis. No diverticular abscess or perforation. 2. No bowel obstruction. 3. Mild left hydronephrosis, possibly secondary to a degree of stricture or decreased peristalsis of the left ureter due to adjacent inflammatory process or fibrosis. No stone. 4. Mild stranding of the distal pancreas. Correlation with pancreatic enzymes recommended to evaluate for acute pancreatitis. No fluid collection. 5. Infiltrative soft tissue mass along the lower thoracic spine at T10-T11 with encasement of the  aorta. This may represent an inflammatory/infectious process (osteomyelitis/discitis) or an infiltrative neoplasm. Further evaluation with MRI without and with contrast is recommended. 6. Thickened appearance of the inferior medial lower lobe pleural surfaces which may be secondary to infiltration of paraspinal/periaortic mass or sequela of chronic inflammation. 7. Left para-aortic soft tissue density to the left of L4 may represent adenopathy or a neoplasm. Electronically Signed   By: Anner Crete M.D.   On: 11/17/2019 18:09   CT CHEST WO CONTRAST  Result Date: 11/18/2019 CLINICAL DATA:  Adenopathy EXAM: CT CHEST WITHOUT CONTRAST TECHNIQUE: Multidetector CT imaging of the chest was performed following the standard protocol without IV contrast. COMPARISON:  Chest CT 07/17/2010 FINDINGS: Cardiovascular: Normal heart size. No pericardial effusion. Aortic and coronary atherosclerosis. Mediastinum/Nodes: Infiltrative posterior mediastinal mass partially encasing the descending aorta with indistinguishable plane between the mass and the posterior wall of the aorta separate from areas of intimal calcification. The infiltrative  abnormality extends approximately 9 cm craniocaudal and 5.6 cm in diameter. The hila and other mediastinal spaces are negative for adenopathy. Lungs/Pleura: Small pleural effusions on both sides. There is subpleural extension of the mass with no visible nodularity along the pleural surface, although limited without contrast. Dependent atelectasis. No worrisome pulmonary nodules. Upper Abdomen: Partial coverage shows left hydronephrosis likely due to the retroperitoneal mass medial to the left psoas on prior CT. Right renal cystic change and atherosclerotic calcification. Cholecystectomy. Musculoskeletal: The posterior mediastinal mass is in close proximity to the spine but does not cause visible erosion or infiltrative lucency. No gross spinal canal mass. No evidence of hematogenous osseous metastases. IMPRESSION: 1. Approximately 9 x 6 cm infiltrative posterior mediastinal mass intimately associated with the descending aorta and thoracic spine. 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 2. Known left hydronephrosis related to infiltrative mass in the retroperitoneum. 3. Small pleural effusions with lower lobe atelectasis. Electronically Signed   By: Monte Fantasia M.D.   On: 11/18/2019 11:27   CT scan was personally reviewed.  There is very mild/minimal hydroureteronephrosis down to the level of the left proximal ureter where the inflammatory/infiltrative process begins.  Impression/Plan:  84 year old female with infiltrative periaortic mass with resultant left hydroureteronephrosis, mild and acute kidney injury.  1.  Left hydroureteronephrosis-as above, tapering of the ureter is appreciated near the level of the infiltrative mass and thus is likely the underlying cause.  Less likely stricture or other etiology.  Overall, the hydronephrosis is relatively minimal.  Unclear whether or not her acute kidney injury is completely related to obstruction or multifactorial.  Discussed with daughter that she will be undergoing biopsy of  this mass tomorrow.  Depending on what her creatinine does, either stable trending upward or downward, we could consider placing a left ureteral stent as she will be n.p.o. tomorrow.  We discussed the risk and benefits of stent in detail.  We discussed that irritative voiding symptoms related to the stent as well as infection.  Ideally, we could avoid this if possible but if obstruction is playing a significant role, this will need to be addressed.  They both agreeable this plan.  We will reassess for seeing in the morning and decide whether or not to proceed at that point time.  2.  Acute kidney injury-marked elevation since November.  Please see discussion above.  3.  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  Undergoing biopsy tomorrow.  We will follow up on pathology results.  Patient will be seen and evaluated by Dr. Grayland Ormond likely today.  We  will discuss my plans for stent with him as well depending on her labs tomorrow.  This was discussed with Dr. Loleta Books as well.  11/18/2019, 12:26 PM  Hollice Espy,  MD   Thank you for involving me in this patient's care, we will continue to follow along. Please page with any further questions or concerns.

## 2019-11-18 NOTE — ED Notes (Signed)
Hospitalist at bedside 

## 2019-11-18 NOTE — ED Notes (Signed)
Urology at bedside.

## 2019-11-18 NOTE — Consult Note (Signed)
[place]  [phone] [phone]  ID: Regina Harris OB: 1936-07-04  MR#: [id number]  IRS#:[id number]  Patient Care Team: Abner Greenspan, MD as PCP - General Byrnett, Forest Gleason, MD (General Surgery) Tobi Bastos, RN as [place] [facility]  CHIEF COMPLAINT: Posterior mediastinal mass, suspicious for underlying malignancy.  INTERVAL HISTORY: Patient is an 84 year old female with multiple medical problems who presented with increasing left lower abdominal pain.  Subsequent work-up including CT scan revealed a 9 x 6 posterior mediastinal mass associated with the descending aorta and thoracic spine highly concerning for underlying lymphoma.  Patient continues to have pain, but states this has been improved since admission.  She has no neurologic complaints.  She denies any recent fevers or illnesses.  She has a fair appetite, but denies weight loss.  She has no chest pain, shortness of breath, cough, or hemoptysis.  She denies any nausea, vomiting, constipation, or diarrhea.  She has no urinary complaints.  Patient offers no further specific complaints today.  REVIEW OF SYSTEMS:   Review of Systems  Constitutional: Positive for malaise/fatigue. Negative for fever and weight loss.  Respiratory: Negative.  Negative for cough, hemoptysis and shortness of breath.   Cardiovascular: Negative.  Negative for chest pain and leg swelling.  Gastrointestinal: Positive for abdominal pain. Negative for blood in stool, constipation, diarrhea and melena.  Genitourinary: Negative.  Negative for dysuria.  Musculoskeletal: Negative.  Negative for back pain and myalgias.  Skin: Negative.  Negative for rash.  Neurological: Positive for weakness. Negative for dizziness, focal weakness and headaches.  Psychiatric/Behavioral: Negative.  The patient is not nervous/anxious.     As per HPI. Otherwise, a complete review of systems is negative.  PAST  MEDICAL HISTORY: Past Medical History:  Diagnosis Date  . Barrett esophagus   . Carotid bruit   . Diabetes mellitus without complication ([place])   . Esophagitis   . Gastritis 2013  . GERD (gastroesophageal reflux disease)   . HH (hiatus hernia)   . History of colonic polyps   . History of repair of right rotator cuff   . Hyperlipidemia   . Hypertension   . Hyperthyroidism 06/09/2018   Per [facility] Therapy for Hyperthyroidism order  . Lump or mass in breast   . Pneumonia   . Polycythemia, secondary   . Tobacco abuse     PAST SURGICAL HISTORY: Past Surgical History:  Procedure Laterality Date  . ABDOMINAL HYSTERECTOMY    . APPENDECTOMY    . BREAST BIOPSY Right 1996  . BREAST BIOPSY Left 10/09/2012   Benign breast tissue with focal fat necrosis and focal periductal chronic inflammation.  Marland Kitchen BREAST BIOPSY Left 10/09/2012   Benign breast tissue with focal fat necrosis and focal periductal chronic inflammation.  Marland Kitchen CATARACT EXTRACTION  3/11   Dr Charise Killian  . CHOLECYSTECTOMY    . COLONOSCOPY  2009  . ESOPHAGOGASTRODUODENOSCOPY (EGD) WITH PROPOFOL N/A 11/02/2015   Procedure: ESOPHAGOGASTRODUODENOSCOPY (EGD) WITH PROPOFOL;  Surgeon: Manya Silvas, MD;  Location: [facility] ENDOSCOPY;  Service: Endoscopy;  Laterality: N/A;  . SHOULDER ARTHROSCOPY WITH OPEN ROTATOR CUFF REPAIR Right 03/05/2016   Procedure: SHOULDER ARTHROSCOPY WITH OPEN ROTATOR CUFF REPAIR;  Surgeon: Earnestine Leys, MD;  Location: [facility] ORS;  Service: Orthopedics;  Laterality: Right;  . UPPER GI ENDOSCOPY  2013    FAMILY HISTORY: Family History  Problem Relation Age of Onset  . Kidney cancer Father   . Diabetes Mother   . Breast cancer  Neg Hx     ADVANCED DIRECTIVES (Y/N):  @ADVDIR @  HEALTH MAINTENANCE: Social History   Tobacco Use  . Smoking status: Former Smoker    Packs/day: 1.00    Types: Cigarettes  . Smokeless tobacco: Never Used  Substance Use Topics  . Alcohol use: No    Alcohol/week: 0.0 standard drinks  .  Drug use: No     Colonoscopy:  PAP:  Bone density:  Lipid panel:  Allergies  Allergen Reactions  . Benadryl [Diphenhydramine] Other (See Comments)    Hallucinations; family request to not give this medication.    Current Facility-Administered Medications  Medication Dose Route Frequency Provider Last Rate Last Admin  . acetaminophen (TYLENOL) tablet 1,000 mg  1,000 mg Oral TID Edwin Dada, MD   1,000 mg at 11/18/19 1858  . albuterol (PROVENTIL) (2.5 MG/3ML) 0.083% nebulizer solution 2.5 mg  2.5 mg Nebulization Q6H PRN Clance Boll, MD      . aspirin EC tablet 81 mg  81 mg Oral Daily Myles Rosenthal A, MD   81 mg at 11/18/19 0937  . atorvastatin (LIPITOR) tablet 10 mg  10 mg Oral Daily Myles Rosenthal A, MD   10 mg at 11/18/19 1859  . calcium-vitamin D (OSCAL WITH D) 500-200 MG-UNIT per tablet 1 tablet  1 tablet Oral BID Clance Boll, MD   1 tablet at 11/18/19 2204  . donepezil (ARICEPT) tablet 5 mg  5 mg Oral QHS Myles Rosenthal A, MD   5 mg at 11/18/19 2204  . levothyroxine (SYNTHROID) tablet 75 mcg  75 mcg Oral Q breakfast Clance Boll, MD   75 mcg at 11/18/19 8546  . loratadine (CLARITIN) tablet 10 mg  10 mg Oral Daily Myles Rosenthal A, MD   10 mg at 11/18/19 0936  . ondansetron (ZOFRAN) tablet 4 mg  4 mg Oral Q6H PRN Clance Boll, MD       Or  . ondansetron [facility]) injection 4 mg  4 mg Intravenous Q6H PRN Clance Boll, MD   4 mg at 11/18/19 0717  . oxyCODONE (Oxy IR/ROXICODONE) immediate release tablet 5 mg  5 mg Oral Q4H PRN Edwin Dada, MD   5 mg at 11/18/19 1931  . pantoprazole (PROTONIX) EC tablet 40 mg  40 mg Oral Daily Myles Rosenthal A, MD   40 mg at 11/18/19 0937  . senna-docusate (Senokot-S) tablet 1 tablet  1 tablet Oral QHS PRN Clance Boll, MD      . vitamin B-12 (CYANOCOBALAMIN) tablet 1,000 mcg  1,000 mcg Oral Daily Myles Rosenthal A, MD   1,000 mcg at 11/18/19 2703  . zinc sulfate capsule  220 mg  220 mg Oral Daily Myles Rosenthal A, MD   220 mg at 11/18/19 5009    OBJECTIVE: Vitals:   11/18/19 1930 11/18/19 2040  BP: (!) 142/65 119/66  Pulse: 83 69  Resp: 20 17  Temp:  98.4 F (36.9 C)  SpO2: 100% 100%     Body mass index is 31.32 kg/m.    ECOG FS:2 - Symptomatic, <50% confined to bed  General: Well-developed, well-nourished, no acute distress.  Lying in bed. Eyes: Pink conjunctiva, anicteric sclera. HEENT: Normocephalic, moist mucous membranes. Lungs: No audible wheezing or coughing. Heart: Regular rate and rhythm. Abdomen: Soft, nontender, no obvious distention. Musculoskeletal: No edema, cyanosis, or clubbing. Neuro: Alert, answering all questions appropriately. Cranial nerves grossly intact. Skin: No rashes or petechiae noted. Psych: Normal affect. Lymphatics: No cervical, calvicular,  axillary or inguinal LAD.   LAB RESULTS:  Lab Results  Component Value Date   NA 137 11/18/2019   K 4.0 11/18/2019   CL 106 11/18/2019   CO2 23 11/18/2019   GLUCOSE 122 (H) 11/18/2019   BUN 15 11/18/2019   CREATININE 1.76 (H) 11/18/2019   CALCIUM 8.6 (L) 11/18/2019   PROT 6.4 (L) 11/18/2019   ALBUMIN 2.9 (L) 11/18/2019   AST 13 (L) 11/18/2019   ALT 7 11/18/2019   ALKPHOS 49 11/18/2019   BILITOT 0.6 11/18/2019   GFRNONAA 27 (L) 11/18/2019   GFRAA 31 (L) 11/18/2019    Lab Results  Component Value Date   WBC 6.6 11/18/2019   NEUTROABS 4.3 09/14/2019   HGB 13.5 11/18/2019   HCT 42.7 11/18/2019   MCV 97.3 11/18/2019   PLT 140 (L) 11/18/2019     STUDIES: CT ABDOMEN PELVIS WO CONTRAST  Result Date: 11/17/2019 CLINICAL DATA:  84 year old female with left-sided abdominal pain. Concern for acute diverticulitis. EXAM: CT ABDOMEN AND PELVIS WITHOUT CONTRAST TECHNIQUE: Multidetector CT imaging of the abdomen and pelvis was performed following the standard protocol without IV contrast. COMPARISON:  CT abdomen pelvis dated 06/13/2006. FINDINGS: Evaluation of this  exam is limited in the absence of intravenous contrast. Lower chest: Trace left pleural effusion. There is thickened appearance of the posteromedial lower lobe pleural surfaces. Partially visualized infiltrative mass encasing the distal descending thoracic aorta and abutting the lower thoracic spine at T10-T11. This may represent an infiltrative neoplasm, lymphoma, or fibrosis versus sequela osteomyelitis/discitis. This is new since the CT of 07/17/2010. Further evaluation with MRI without and with contrast is recommended. Bibasilar subpleural linear and streaky densities may represent atelectasis/scarring. Infiltrate is less likely. Clinical correlation is recommended. There is no intra-abdominal free air or free fluid. Hepatobiliary: The liver is unremarkable. No intrahepatic biliary ductal dilatation. Cholecystectomy. No retained calcified stone noted in the central CBD. Pancreas: There is mild haziness of the distal pancreas which may represent acute pancreatitis. Correlation with pancreatic enzymes recommended. No drainable fluid collection/abscess or pseudocyst Spleen: Normal in size without focal abnormality. Adrenals/Urinary Tract: There is a 2 cm left adrenal adenoma. The right adrenal gland is unremarkable. There is mild left hydronephrosis. No calcified stone identified. There is haziness and stranding of the proximal left periureteric fat which may represent an infection/inflammatory process or fibrosis and possible degree of ureteral stricture. There is a 2 cm left renal inferior pole hypodense lesion which is not characterized but demonstrates fluid attenuation most consistent with a cyst. There is no hydronephrosis or nephrolithiasis on the right. Multiple right renal cysts measure up to approximately 7 cm. The right ureter and urinary bladder appear unremarkable. Stomach/Bowel: There is sigmoid diverticulosis with muscular hypertrophy. Mild perisigmoid stranding and haziness may represent mild acute  diverticulitis. Clinical correlation is recommended. There is diffuse thickened appearance of the colon which may be related to underdistention. Diffuse submucosal fat deposit along the colonic wall likely sequela of chronic inflammatory process. There is no bowel obstruction. Appendectomy. Vascular/Lymphatic: Advanced aortoiliac atherosclerotic disease. There is stranding of the periaortic fat. There is a 3.8 x 2.1 cm left para-aortic/paraspinal soft tissue lesion which is not characterized but may represent enlarged lymph node (series 2 image 38). However, this soft tissue appears to abut the left L4 vertebra and therefore a neoplasm or an infectious process is not excluded. No portal venous gas. The IVC is unremarkable. Reproductive: Hysterectomy. Other: None Musculoskeletal: Osteopenia with extensive multilevel degenerative changes, disc desiccation and vacuum  phenomena. Old-appearing multilevel compression fractures most prominent involving L3 and L4. No definite acute fracture. No retropulsed fragment. IMPRESSION: 1. Sigmoid diverticulosis with findings concerning for mild diverticulitis. No diverticular abscess or perforation. 2. No bowel obstruction. 3. Mild left hydronephrosis, possibly secondary to a degree of stricture or decreased peristalsis of the left ureter due to adjacent inflammatory process or fibrosis. No stone. 4. Mild stranding of the distal pancreas. Correlation with pancreatic enzymes recommended to evaluate for acute pancreatitis. No fluid collection. 5. Infiltrative soft tissue mass along the lower thoracic spine at T10-T11 with encasement of the aorta. This may represent an inflammatory/infectious process (osteomyelitis/discitis) or an infiltrative neoplasm. Further evaluation with MRI without and with contrast is recommended. 6. Thickened appearance of the inferior medial lower lobe pleural surfaces which may be secondary to infiltration of paraspinal/periaortic mass or sequela of chronic  inflammation. 7. Left para-aortic soft tissue density to the left of L4 may represent adenopathy or a neoplasm. Electronically Signed   By: Anner Crete M.D.   On: 11/17/2019 18:09   CT CHEST WO CONTRAST  Result Date: 11/18/2019 CLINICAL DATA:  Adenopathy EXAM: CT CHEST WITHOUT CONTRAST TECHNIQUE: Multidetector CT imaging of the chest was performed following the standard protocol without IV contrast. COMPARISON:  Chest CT 07/17/2010 FINDINGS: Cardiovascular: Normal heart size. No pericardial effusion. Aortic and coronary atherosclerosis. Mediastinum/Nodes: Infiltrative posterior mediastinal mass partially encasing the descending aorta with indistinguishable plane between the mass and the posterior wall of the aorta separate from areas of intimal calcification. The infiltrative abnormality extends approximately 9 cm craniocaudal and 5.6 cm in diameter. The hila and other mediastinal spaces are negative for adenopathy. Lungs/Pleura: Small pleural effusions on both sides. There is subpleural extension of the mass with no visible nodularity along the pleural surface, although limited without contrast. Dependent atelectasis. No worrisome pulmonary nodules. Upper Abdomen: Partial coverage shows left hydronephrosis likely due to the retroperitoneal mass medial to the left psoas on prior CT. Right renal cystic change and atherosclerotic calcification. Cholecystectomy. Musculoskeletal: The posterior mediastinal mass is in close proximity to the spine but does not cause visible erosion or infiltrative lucency. No gross spinal canal mass. No evidence of hematogenous osseous metastases. IMPRESSION: 1. Approximately 9 x 6 cm infiltrative posterior mediastinal mass intimately associated with the descending aorta and thoracic spine. 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 2. Known left hydronephrosis related to infiltrative mass in the retroperitoneum. 3. Small pleural effusions with lower lobe atelectasis. Electronically Signed   By:  Monte Fantasia M.D.   On: 11/18/2019 11:27    ASSESSMENT: Posterior mediastinal mass, suspicious for underlying malignancy.  PLAN:    1. Posterior mediastinal mass, suspicious for underlying malignancy: CT of the chest reviewed independently and reported as above with a 9 x 6 cm infiltrative posterior mediastinal mass highly suspicious for underlying malignancy, possibly lymphoma.  Patient also noted to have a 3.8 x 2.1 cm left periaortic/paraspinal soft tissue lesion possibly an enlarged lymph node.  Patient has CT-guided biopsy tomorrow for diagnosis.  Once patient's pain is under control, okay to discharge from an oncology standpoint.  Will arrange video assisted telemedicine visit next week for further evaluation and discussion of her biopsy results. 2.  Renal insufficiency: Patient had a normal creatinine as recently as November 2020.  Possibly secondary to dehydration although mass does compress ureter.  Appreciate urology input.  No apparent need for stent at this time, but plan to reevaluate in the a.m to see if kidney function improves with fluids alone. 3.  Thrombocytopenia: Mild, monitor.  Appreciate consult, will follow.   Lloyd Huger, MD   11/18/2019 10:37 PM

## 2019-11-19 ENCOUNTER — Inpatient Hospital Stay: Payer: Medicare Other

## 2019-11-19 ENCOUNTER — Encounter: Payer: Self-pay | Admitting: Internal Medicine

## 2019-11-19 ENCOUNTER — Encounter
Admission: EM | Disposition: A | Discharge status: Home / Self Care | Payer: Self-pay | Source: Home / Self Care | Attending: Family Medicine

## 2019-11-19 ENCOUNTER — Inpatient Hospital Stay: Payer: Medicare Other | Admitting: Anesthesiology

## 2019-11-19 DIAGNOSIS — N1330 Unspecified hydronephrosis: Secondary | ICD-10-CM | POA: Diagnosis not present

## 2019-11-19 HISTORY — PX: CYSTOSCOPY WITH URETEROSCOPY AND STENT PLACEMENT: SHX6377

## 2019-11-19 LAB — COMPREHENSIVE METABOLIC PANEL
ALT: 6 U/L (ref 0–44)
AST: 10 U/L — ABNORMAL LOW (ref 15–41)
Albumin: 2.9 g/dL — ABNORMAL LOW (ref 3.5–5.0)
Alkaline Phosphatase: 53 U/L (ref 38–126)
Anion gap: 7 (ref 5–15)
BUN: 15 mg/dL (ref 8–23)
CO2: 23 mmol/L (ref 22–32)
Calcium: 8.4 mg/dL — ABNORMAL LOW (ref 8.9–10.3)
Chloride: 108 mmol/L (ref 98–111)
Creatinine, Ser: 1.6 mg/dL — ABNORMAL HIGH (ref 0.44–1.00)
GFR calc Af Amer: 35 mL/min — ABNORMAL LOW (ref >60)
GFR calc non Af Amer: 30 mL/min — ABNORMAL LOW (ref >60)
Glucose, Bld: 128 mg/dL — ABNORMAL HIGH (ref 70–99)
Potassium: 3.8 mmol/L (ref 3.5–5.1)
Sodium: 138 mmol/L (ref 135–145)
Total Bilirubin: 0.8 mg/dL (ref 0.3–1.2)
Total Protein: 6.6 g/dL (ref 6.5–8.1)

## 2019-11-19 LAB — CBC
HCT: 42.5 % (ref 36.0–46.0)
Hemoglobin: 13.9 g/dL (ref 12.0–15.0)
MCH: 31.4 pg (ref 26.0–34.0)
MCHC: 32.7 g/dL (ref 30.0–36.0)
MCV: 96.2 fL (ref 80.0–100.0)
Platelets: 137 10*3/uL — ABNORMAL LOW (ref 150–400)
RBC: 4.42 MIL/uL (ref 3.87–5.11)
RDW: 11.4 % — ABNORMAL LOW (ref 11.5–15.5)
WBC: 8.2 10*3/uL (ref 4.0–10.5)
nRBC: 0 % (ref 0.0–0.2)

## 2019-11-19 LAB — GLUCOSE, CAPILLARY
Glucose-Capillary: 91 mg/dL (ref 70–99)
Glucose-Capillary: 77 mg/dL (ref 70–99)

## 2019-11-19 LAB — PROTIME-INR
INR: 1.3 — ABNORMAL HIGH (ref 0.8–1.2)
Prothrombin Time: 16 seconds — ABNORMAL HIGH (ref 11.4–15.2)

## 2019-11-19 SURGERY — CYSTOURETEROSCOPY, WITH STENT INSERTION
Anesthesia: General | Laterality: Left

## 2019-11-19 MED ORDER — CHLORHEXIDINE GLUCONATE CLOTH 2 % EX PADS: 6.0000 | MEDICATED_PAD | Freq: Every day | CUTANEOUS | Status: DC

## 2019-11-19 MED ORDER — ONDANSETRON HCL 4 MG/2ML IJ SOLN
INTRAMUSCULAR | Status: DC | PRN
  Administered 2019-11-19: 4 mg via INTRAVENOUS

## 2019-11-19 MED ORDER — ACETAMINOPHEN 160 MG/5ML PO SOLN
325.0000 mg | ORAL | Status: DC | PRN
  Filled 2019-11-19: qty 20.3

## 2019-11-19 MED ORDER — HYDROCODONE-ACETAMINOPHEN 7.5-325 MG PO TABS
1.0000 | ORAL_TABLET | Freq: Once | ORAL | Status: DC | PRN
  Filled 2019-11-19: qty 1

## 2019-11-19 MED ORDER — PROPOFOL 10 MG/ML IV BOLUS
INTRAVENOUS | Status: DC | PRN
  Administered 2019-11-19: 120 mg via INTRAVENOUS

## 2019-11-19 MED ORDER — LIDOCAINE HCL (CARDIAC) PF 100 MG/5ML IV SOSY
PREFILLED_SYRINGE | INTRAVENOUS | Status: DC | PRN
  Administered 2019-11-19: 60 mg via INTRAVENOUS

## 2019-11-19 MED ORDER — FENTANYL CITRATE (PF) 100 MCG/2ML IJ SOLN
INTRAMUSCULAR | Status: AC
  Filled 2019-11-19: qty 2

## 2019-11-19 MED ORDER — IOHEXOL 180 MG/ML  SOLN
INTRAMUSCULAR | Status: DC | PRN
  Administered 2019-11-19: 15:00:00 30 mL

## 2019-11-19 MED ORDER — ONDANSETRON HCL 4 MG/2ML IJ SOLN
INTRAMUSCULAR | Status: AC
  Filled 2019-11-19: qty 2

## 2019-11-19 MED ORDER — LIDOCAINE HCL (PF) 2 % IJ SOLN
INTRAMUSCULAR | Status: AC
  Filled 2019-11-19: qty 10

## 2019-11-19 MED ORDER — LACTATED RINGERS IV SOLN: INTRAVENOUS | Status: DC | PRN

## 2019-11-19 MED ORDER — DEXAMETHASONE SODIUM PHOSPHATE 10 MG/ML IJ SOLN
INTRAMUSCULAR | Status: AC
  Filled 2019-11-19: qty 1

## 2019-11-19 MED ORDER — FENTANYL CITRATE (PF) 100 MCG/2ML IJ SOLN
INTRAMUSCULAR | Status: DC | PRN
  Administered 2019-11-19 (×2): 25 ug via INTRAVENOUS

## 2019-11-19 MED ORDER — PROMETHAZINE HCL 25 MG/ML IJ SOLN: 6.2500 mg | INTRAMUSCULAR | Status: DC | PRN

## 2019-11-19 MED ORDER — FENTANYL CITRATE (PF) 100 MCG/2ML IJ SOLN: 25.0000 ug | INTRAMUSCULAR | Status: DC | PRN

## 2019-11-19 MED ORDER — ACETAMINOPHEN 325 MG PO TABS: 325.0000 mg | ORAL_TABLET | ORAL | Status: DC | PRN

## 2019-11-19 MED ORDER — SODIUM CHLORIDE 0.9 % IV SOLN: INTRAVENOUS | Status: DC

## 2019-11-19 MED ORDER — SODIUM CHLORIDE 0.9 % IV SOLN
1.0000 g | Freq: Once | INTRAVENOUS | Status: AC
  Administered 2019-11-19: 1 g via INTRAVENOUS
  Filled 2019-11-19: qty 1

## 2019-11-19 MED ORDER — DEXAMETHASONE SODIUM PHOSPHATE 10 MG/ML IJ SOLN
INTRAMUSCULAR | Status: DC | PRN
  Administered 2019-11-19: 5 mg via INTRAVENOUS

## 2019-11-19 MED ORDER — PROPOFOL 10 MG/ML IV BOLUS
INTRAVENOUS | Status: AC
  Filled 2019-11-19: qty 20

## 2019-11-19 SURGICAL SUPPLY — 32 items
BAG DRAIN CYSTO-URO LG1000N (MISCELLANEOUS) ×1 IMPLANT
BASKET ZERO TIP 1.9FR (BASKET) IMPLANT
BRUSH SCRUB EZ 1% IODOPHOR (MISCELLANEOUS) ×3 IMPLANT
BSKT STON RTRVL ZERO TP 1.9FR (BASKET)
CATH FOL 2WAY LX 16X5 (CATHETERS) ×2 IMPLANT
CATH URETL 5X70 OPEN END (CATHETERS) ×3 IMPLANT
CNTNR SPEC 2.5X3XGRAD LEK (MISCELLANEOUS)
CONT SPEC 4OZ STER OR WHT (MISCELLANEOUS)
CONT SPEC 4OZ STRL OR WHT (MISCELLANEOUS)
CONTAINER SPEC 2.5X3XGRAD LEK (MISCELLANEOUS) IMPLANT
DRAPE UTILITY 15X26 TOWEL STRL (DRAPES) ×3 IMPLANT
FIBER LASER TRAC TIP (UROLOGICAL SUPPLIES) IMPLANT
GLOVE BIO SURGEON STRL SZ 6.5 (GLOVE) ×2 IMPLANT
GLOVE BIO SURGEONS STRL SZ 6.5 (GLOVE) ×1
GOWN STRL REUS W/ TWL LRG LVL3 (GOWN DISPOSABLE) ×2 IMPLANT
GOWN STRL REUS W/TWL LRG LVL3 (GOWN DISPOSABLE) ×6
GUIDEWIRE GREEN .038 145CM (MISCELLANEOUS) IMPLANT
GUIDEWIRE STR DUAL SENSOR (WIRE) ×3 IMPLANT
INFUSOR MANOMETER BAG 3000ML (MISCELLANEOUS) ×3 IMPLANT
INTRODUCER DILATOR DOUBLE (INTRODUCER) IMPLANT
KIT TURNOVER CYSTO (KITS) ×3 IMPLANT
PACK CYSTO AR (MISCELLANEOUS) ×3 IMPLANT
SET CYSTO W/LG BORE CLAMP LF (SET/KITS/TRAYS/PACK) ×3 IMPLANT
SHEATH URETERAL 12FRX35CM (MISCELLANEOUS) IMPLANT
SOL .9 NS 3000ML IRR  AL (IV SOLUTION) ×2
SOL .9 NS 3000ML IRR AL (IV SOLUTION) ×1
SOL .9 NS 3000ML IRR UROMATIC (IV SOLUTION) ×1 IMPLANT
STENT URET 6FRX24 CONTOUR (STENTS) IMPLANT
STENT URET 6FRX26 CONTOUR (STENTS) IMPLANT
STENT URO INLAY 6FRX26CM (STENTS) ×2 IMPLANT
SURGILUBE 2OZ TUBE FLIPTOP (MISCELLANEOUS) ×3 IMPLANT
WATER STERILE IRR 1000ML POUR (IV SOLUTION) ×3 IMPLANT

## 2019-11-19 NOTE — Progress Notes (Signed)
Urology Inpatient Progress Note  Subjective: Regina Harris is a 84 y.o. female admitted on 11/17/2019 with LLQ pain and subsequently found to have a periaortal mass with concern for possible underlying malignancy as well as diverticulitis, pancreatitis, mild left hydroureteronephrosis, and AKI.  She is n.p.o. this morning; plans for mass biopsy today have been moved to tomorrow.  Creatinine down today, 1.60.  WBC count 8.2.  Today, she reports no pain but general malaise.  She is afebrile, hypertensive to the 150s/80s.  She is spontaneously voiding amber-colored urine.  Granddaughter is at the bedside.  Anti-infectives: Anti-infectives (From admission, onward)   Start     Dose/Rate Route Frequency Ordered Stop   11/18/19 0830  amoxicillin (AMOXIL) capsule 500 mg  Status:  Discontinued     500 mg Oral Every 8 hours 11/18/19 0817 11/18/19 1205   11/17/19 1845  amoxicillin-clavulanate (AUGMENTIN) 875-125 MG per tablet 1 tablet     1 tablet Oral  Once 11/17/19 1832 11/17/19 1843   11/17/19 1845  metroNIDAZOLE (FLAGYL) tablet 500 mg     500 mg Oral  Once 11/17/19 1832 11/17/19 1843      Current Facility-Administered Medications  Medication Dose Route Frequency Provider Last Rate Last Admin  . acetaminophen (TYLENOL) tablet 1,000 mg  1,000 mg Oral TID Edwin Dada, MD   1,000 mg at 11/18/19 1858  . albuterol (PROVENTIL) (2.5 MG/3ML) 0.083% nebulizer solution 2.5 mg  2.5 mg Nebulization Q6H PRN Clance Boll, MD      . aspirin EC tablet 81 mg  81 mg Oral Daily Myles Rosenthal A, MD   81 mg at 11/18/19 0937  . atorvastatin (LIPITOR) tablet 10 mg  10 mg Oral Daily Myles Rosenthal A, MD   10 mg at 11/18/19 1859  . calcium-vitamin D (OSCAL WITH D) 500-200 MG-UNIT per tablet 1 tablet  1 tablet Oral BID Clance Boll, MD   1 tablet at 11/18/19 2204  . donepezil (ARICEPT) tablet 5 mg  5 mg Oral QHS Myles Rosenthal A, MD   5 mg at 11/18/19 2204  . levothyroxine (SYNTHROID)  tablet 75 mcg  75 mcg Oral Q breakfast Clance Boll, MD   75 mcg at 11/18/19 8546  . loratadine (CLARITIN) tablet 10 mg  10 mg Oral Daily Myles Rosenthal A, MD   10 mg at 11/18/19 0936  . ondansetron (ZOFRAN) tablet 4 mg  4 mg Oral Q6H PRN Clance Boll, MD       Or  . ondansetron [facility]) injection 4 mg  4 mg Intravenous Q6H PRN Clance Boll, MD   4 mg at 11/18/19 0717  . oxyCODONE (Oxy IR/ROXICODONE) immediate release tablet 5 mg  5 mg Oral Q4H PRN Edwin Dada, MD   5 mg at 11/19/19 0305  . pantoprazole (PROTONIX) EC tablet 40 mg  40 mg Oral Daily Myles Rosenthal A, MD   40 mg at 11/18/19 0937  . senna-docusate (Senokot-S) tablet 1 tablet  1 tablet Oral QHS PRN Clance Boll, MD      . vitamin B-12 (CYANOCOBALAMIN) tablet 1,000 mcg  1,000 mcg Oral Daily Myles Rosenthal A, MD   1,000 mcg at 11/18/19 2703  . zinc sulfate capsule 220 mg  220 mg Oral Daily Myles Rosenthal A, MD   220 mg at 11/18/19 5009     Objective: Vital signs in last 24 hours: Temp:  [97.8 F (36.6 C)-98.8 F (37.1 C)] 97.8 F (36.6 C) (01/14 0800) Pulse  Rate:  [69-87] 87 (01/14 0800) Resp:  [17-20] 17 (01/13 2040) BP: (119-159)/(65-82) 159/81 (01/14 0800) SpO2:  [93 %-100 %] 99 % (01/14 0800)  Intake/Output from previous day: 01/13 0701 - 01/14 0700 In: 700 [P.O.:200; IV Piggyback:500] Out: 150 [Urine:150] Intake/Output this shift: No intake/output data recorded.  Physical Exam Vitals and nursing note reviewed.  Constitutional:      General: She is not in acute distress.    Appearance: She is not ill-appearing, toxic-appearing or diaphoretic.  HENT:     Head: Normocephalic and atraumatic.  Pulmonary:     Effort: Pulmonary effort is normal. No respiratory distress.  Skin:    General: Skin is warm and dry.  Neurological:     Mental Status: She is alert. Mental status is at baseline.  Psychiatric:        Mood and Affect: Mood normal.        Behavior: Behavior  normal.    Lab Results:  Recent Labs    11/18/19 0630 11/19/19 0342  WBC 6.6 8.2  HGB 13.5 13.9  HCT 42.7 42.5  PLT 140* 137*   BMET Recent Labs    11/18/19 0630 11/19/19 0342  NA 137 138  K 4.0 3.8  CL 106 108  CO2 23 23  GLUCOSE 122* 128*  BUN 15 15  CREATININE 1.76* 1.60*  CALCIUM 8.6* 8.4*   PT/INR Recent Labs    11/19/19 0342  LABPROT 16.0*  INR 1.3*   Assessment & Plan: 84 year old female admitted with new periaortic mass, diverticulitis, pancreatitis, mild left hydroureteronephrosis, and AKI.  Biopsy rescheduled from today to tomorrow.  Given stably elevated creatinine, will plan for left ureteral stent placement today.  She is n.p.o. this morning and should stay this way in advance of procedure.  I counseled the patient and her granddaughter that stents can cause pain in the flank or groin, dysuria, urinary urgency, urinary frequency, and/or gross hematuria. I informed her that she may expect to experience these symptoms for the duration of the stent being in place.  Additionally, I explained that the risks of surgical procedures include bleeding, infection, and damage to surrounding structures.  I also explained that there is a small risk of stent failure secondary to external ureteral compression which may require placement of left nephrostomy tube. She expressed understanding of this plan.  Debroah Loop, PA-C 11/19/2019

## 2019-11-19 NOTE — Progress Notes (Signed)
PROGRESS NOTE    Regina Harris  ZJQ:[id number] DOB: 02-01-1936 DOA: 11/17/2019 PCP: Abner Greenspan, MD      Brief Narrative:  Regina Harris is a 84 y.o. F with DM, HTN, hypothyroidism and recent Covid infection, still quite debilitated who presented with acute abdominal pain for 1 hour.  Patient has been slowly recuperating from [place] last October, at home, when she developed severe left lower abdominal pain 1 hour PTA.  In the ER, she was afebrile, heart rate and blood pressure normal.  Creatinine 1.7 from baseline 0.9.  WBC normal.  Hemoglobin stable.  CT of the abdomen and pelvis showed possible mild diverticulitis, mild left hydronephrosis, mild stranding distal pancreas, and infiltrative soft tissue mass along the lower thoracic spine at T10-11 with encasement of the aorta.       Assessment & Plan:  Acute pancreatitis Doubt diverticulitis Patient presented with left sided pain, radiating into groin.  ?divertiulitis vs hydronephrosis.  Although lipase 111, no epigastric tenderness.  Absence of leukocytosis or fever militate against diverticulitis.  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  Less likely infection in absence of fever, constitutional symptoms, leukocytosis. -IR consultation for biopsy tomorrow  AKI Baseline creatinine 0.9.  Creatinine 1.7 on admission, only down to 1.6 today.  Still elevated. FeNA 2.1%, hydronephrosis noted on CT on the left.  Urology were consulted, recommended stent today.   -IV fluids and monitor Cr -Strict I/Os -Hold Lasix  Recent UTI Enterococcus growing in urine from 1 week ago.  Finished 5 days Macrobid.  Adequate.   Diabetes Diet controlled -Continue aspirin and atorvastatin  Hypertension BP normal -Hold diuretic  Hypothyroidism -Continue  levothyroxine  Dementia -Continue donepezil  Other medications -Continue loratadine -Continue pantoprazole -Continue B12 supplement        Disposition: The patient was admitted with abdominal pain.  WOrk up continues, as this seems less likely pancreatitis or diveritulitis, but pain from hydronephrosis from a newly discovered para-aortic mass in setting of AKI.    Her AKI persists.  We will discharge when her stent is in place, her renal function is improving, and her mass has been biopsied.  PT eval pending.           MDM: The below labs and imaging reports reviewed and summarized above.  Medication management as above.    DVT prophylaxis: SCDs Code Status: Full code Family Communication: Daughter at the bedside    Consultants:   Interventional radiology  Urology  Procedures:   1/12 CT abdomen and pelvis  1/13 MRI abdomen    Subjective: Still having some left lower quadrant pain, no fever, no hematuria, no respiratory distress, no vomiting.  Plan for stent today.  Objective: Vitals:   11/19/19 1546 11/19/19 1556 11/19/19 1605 11/19/19 1606  BP: 130/62 135/66  (!) 148/64  Pulse: 78 79 87 88  Resp: 17 15 17 18   Temp:   99.2 F (37.3 C)   TempSrc:      SpO2: 94% 96% 96% 97%  Weight:      Height:        Intake/Output Summary (Last 24 hours) at 11/19/2019 1625 Last data filed at 11/19/2019 1608 Gross per 24 hour  Intake 400 ml  Output 750 ml  Net -350 ml   Filed Weights   11/17/19 1618  Weight: 90.7 kg  Examination: General appearance: Elderly adult female, alert and in no acute distress.   HEENT: Anicteric, conjunctiva pink, lids and lashes normal. No nasal deformity, discharge, epistaxis.  Lips moist, teeth normal. OP , no oral lesions.   Skin: Warm and dry.  No suspicious rashes or lesions. Cardiac: RRR, no murmurs appreciated.  No LE edema.    Respiratory: Normal respiratory rate and rhythm.  CTAB without rales or wheezes. Abdomen:  Abdomen soft.  Tenderness palpation lower left side. No ascites, distension, hepatosplenomegaly.   MSK: No deformities or effusions of the large joints of the upper or lower extremities bilaterally. Neuro: Awake and alert.  Generalized weakness. Speech fluent.    Psych: Attention diminished. Affect blunted.  Judgment and insight appear impaired.    Data Reviewed: I have personally reviewed following labs and imaging studies:  CBC: Recent Labs  Lab 11/17/19 1626 11/18/19 0630 11/19/19 0342  WBC 6.4 6.6 8.2  HGB 14.3 13.5 13.9  HCT 44.2 42.7 42.5  MCV 97.4 97.3 96.2  PLT 156 140* 196*   Basic Metabolic Panel: Recent Labs  Lab 11/17/19 1626 11/18/19 0630 11/19/19 0342  NA 140 137 138  K 4.4 4.0 3.8  CL 104 106 108  CO2 26 23 23   GLUCOSE 128* 122* 128*  BUN 16 15 15   CREATININE 1.69* 1.76* 1.60*  CALCIUM 9.3 8.6* 8.4*   GFR: Estimated Creatinine Clearance: 31.9 mL/min (A) (by C-G formula based on SCr of 1.6 mg/dL (H)). Liver Function Tests: Recent Labs  Lab 11/17/19 1626 11/18/19 0630 11/19/19 0342  AST 15 13* 10*  ALT 7 7 6   ALKPHOS 61 49 53  BILITOT 0.6 0.6 0.8  PROT 7.6 6.4* 6.6  ALBUMIN 3.4* 2.9* 2.9*   Recent Labs  Lab 11/17/19 1626  LIPASE 111*   No results for input(s): AMMONIA in the last 168 hours. Coagulation Profile: Recent Labs  Lab 11/19/19 0342  INR 1.3*   Cardiac Enzymes: No results for input(s): CKTOTAL, CKMB, CKMBINDEX, TROPONINI in the last 168 hours. BNP (last 3 results) No results for input(s): PROBNP in the last 8760 hours. HbA1C: No results for input(s): HGBA1C in the last 72 hours. CBG: Recent Labs  Lab 11/19/19 1419 11/19/19 1547  GLUCAP 91 77   Lipid Profile: No results for input(s): CHOL, HDL, LDLCALC, TRIG, CHOLHDL, LDLDIRECT in the last 72 hours. Thyroid Function Tests: No results for input(s): TSH, T4TOTAL, FREET4, T3FREE, THYROIDAB in the last 72 hours. Anemia Panel: No results for input(s): VITAMINB12, FOLATE,  FERRITIN, TIBC, IRON, RETICCTPCT in the last 72 hours. Urine analysis:    Component Value Date/Time   COLORURINE YELLOW (A) 11/17/2019 1626   APPEARANCEUR HAZY (A) 11/17/2019 1626   LABSPEC 1.011 11/17/2019 1626   PHURINE 5.0 11/17/2019 1626   GLUCOSEU NEGATIVE 11/17/2019 1626   HGBUR NEGATIVE 11/17/2019 1626   BILIRUBINUR NEGATIVE 11/17/2019 1626   BILIRUBINUR Negative 11/10/2019 1643   KETONESUR NEGATIVE 11/17/2019 1626   PROTEINUR NEGATIVE 11/17/2019 1626   UROBILINOGEN 0.2 11/10/2019 1643   NITRITE NEGATIVE 11/17/2019 1626   LEUKOCYTESUR NEGATIVE 11/17/2019 1626   Sepsis Labs: @LABRCNTIP (procalcitonin:4,lacticacidven:4)  ) Recent Results (from the past 240 hour(s))  Urine culture     Status: Abnormal   Collection Time: 11/10/19  4:44 PM   Specimen: Urine  Result Value Ref Range Status   MICRO NUMBER: [id number]  Final   SPECIMEN QUALITY: Adequate  Final   Sample Source URINE  Final   STATUS: FINAL  Final   ISOLATE 1: Enterococcus  faecalis (A)  Final    Comment: Greater than 100,000 CFU/mL of Enterococcus faecalis      Susceptibility   Enterococcus faecalis - URINE CULTURE POSITIVE 1    AMPICILLIN <=2 Sensitive     VANCOMYCIN 1 Sensitive     NITROFURANTOIN* <=16 Sensitive      * Legend:S = Susceptible  I = IntermediateR = Resistant  NS = Not susceptible* = Not tested  NR = Not reported**NN = See antimicrobic comments  SARS CORONAVIRUS 2 (TAT 6-24 HRS) Nasopharyngeal Nasopharyngeal Swab     Status: None   Collection Time: 11/17/19  7:36 PM   Specimen: Nasopharyngeal Swab  Result Value Ref Range Status   SARS Coronavirus 2 NEGATIVE NEGATIVE Final    Comment: (NOTE) SARS-CoV-2 target nucleic acids are NOT DETECTED. The SARS-CoV-2 RNA is generally detectable in upper and lower respiratory specimens during the acute phase of infection. Negative results do not preclude SARS-CoV-2 infection, do not rule out co-infections with other pathogens, and should not be used as  the sole basis for treatment or other patient management decisions. Negative results must be combined with clinical observations, patient history, and epidemiological information. The expected result is Negative. Fact Sheet for Patients: SugarRoll.be Fact Sheet for Healthcare Providers: https://www.woods-mathews.com/ This test is not yet approved or cleared by the [place] FDA and  has been authorized for detection and/or diagnosis of SARS-CoV-2 by FDA under an Emergency Use Authorization (EUA). This EUA will remain  in effect (meaning this test can be used) for the duration of the COVID-19 declaration under Section 56 4(b)(1) of the Act, 21 U.S.C. section 360bbb-3(b)(1), unless the authorization is terminated or revoked sooner. Performed at [facility], [place] [address], [place], [place] [address]          Radiology Studies: CT ABDOMEN PELVIS WO CONTRAST  Result Date: 11/17/2019 CLINICAL DATA:  84 year old female with left-sided abdominal pain. Concern for acute diverticulitis. EXAM: CT ABDOMEN AND PELVIS WITHOUT CONTRAST TECHNIQUE: Multidetector CT imaging of the abdomen and pelvis was performed following the standard protocol without IV contrast. COMPARISON:  CT abdomen pelvis dated 06/13/2006. FINDINGS: Evaluation of this exam is limited in the absence of intravenous contrast. Lower chest: Trace left pleural effusion. There is thickened appearance of the posteromedial lower lobe pleural surfaces. Partially visualized infiltrative mass encasing the distal descending thoracic aorta and abutting the lower thoracic spine at T10-T11. This may represent an infiltrative neoplasm, lymphoma, or fibrosis versus sequela osteomyelitis/discitis. This is new since the CT of 07/17/2010. Further evaluation with MRI without and with contrast is recommended. Bibasilar subpleural linear and streaky densities may represent atelectasis/scarring. Infiltrate  is less likely. Clinical correlation is recommended. There is no intra-abdominal free air or free fluid. Hepatobiliary: The liver is unremarkable. No intrahepatic biliary ductal dilatation. Cholecystectomy. No retained calcified stone noted in the central CBD. Pancreas: There is mild haziness of the distal pancreas which may represent acute pancreatitis. Correlation with pancreatic enzymes recommended. No drainable fluid collection/abscess or pseudocyst Spleen: Normal in size without focal abnormality. Adrenals/Urinary Tract: There is a 2 cm left adrenal adenoma. The right adrenal gland is unremarkable. There is mild left hydronephrosis. No calcified stone identified. There is haziness and stranding of the proximal left periureteric fat which may represent an infection/inflammatory process or fibrosis and possible degree of ureteral stricture. There is a 2 cm left renal inferior pole hypodense lesion which is not characterized but demonstrates fluid attenuation most consistent with a cyst. There is no hydronephrosis  or nephrolithiasis on the right. Multiple right renal cysts measure up to approximately 7 cm. The right ureter and urinary bladder appear unremarkable. Stomach/Bowel: There is sigmoid diverticulosis with muscular hypertrophy. Mild perisigmoid stranding and haziness may represent mild acute diverticulitis. Clinical correlation is recommended. There is diffuse thickened appearance of the colon which may be related to underdistention. Diffuse submucosal fat deposit along the colonic wall likely sequela of chronic inflammatory process. There is no bowel obstruction. Appendectomy. Vascular/Lymphatic: Advanced aortoiliac atherosclerotic disease. There is stranding of the periaortic fat. There is a 3.8 x 2.1 cm left para-aortic/paraspinal soft tissue lesion which is not characterized but may represent enlarged lymph node (series 2 image 38). However, this soft tissue appears to abut the left L4 vertebra and  therefore a neoplasm or an infectious process is not excluded. No portal venous gas. The IVC is unremarkable. Reproductive: Hysterectomy. Other: None Musculoskeletal: Osteopenia with extensive multilevel degenerative changes, disc desiccation and vacuum phenomena. Old-appearing multilevel compression fractures most prominent involving L3 and L4. No definite acute fracture. No retropulsed fragment. IMPRESSION: 1. Sigmoid diverticulosis with findings concerning for mild diverticulitis. No diverticular abscess or perforation. 2. No bowel obstruction. 3. Mild left hydronephrosis, possibly secondary to a degree of stricture or decreased peristalsis of the left ureter due to adjacent inflammatory process or fibrosis. No stone. 4. Mild stranding of the distal pancreas. Correlation with pancreatic enzymes recommended to evaluate for acute pancreatitis. No fluid collection. 5. Infiltrative soft tissue mass along the lower thoracic spine at T10-T11 with encasement of the aorta. This may represent an inflammatory/infectious process (osteomyelitis/discitis) or an infiltrative neoplasm. Further evaluation with MRI without and with contrast is recommended. 6. Thickened appearance of the inferior medial lower lobe pleural surfaces which may be secondary to infiltration of paraspinal/periaortic mass or sequela of chronic inflammation. 7. Left para-aortic soft tissue density to the left of L4 may represent adenopathy or a neoplasm. Electronically Signed   By: Anner Crete M.D.   On: 11/17/2019 18:09   CT CHEST WO CONTRAST  Result Date: 11/18/2019 CLINICAL DATA:  Adenopathy EXAM: CT CHEST WITHOUT CONTRAST TECHNIQUE: Multidetector CT imaging of the chest was performed following the standard protocol without IV contrast. COMPARISON:  Chest CT 07/17/2010 FINDINGS: Cardiovascular: Normal heart size. No pericardial effusion. Aortic and coronary atherosclerosis. Mediastinum/Nodes: Infiltrative posterior mediastinal mass partially  encasing the descending aorta with indistinguishable plane between the mass and the posterior wall of the aorta separate from areas of intimal calcification. The infiltrative abnormality extends approximately 9 cm craniocaudal and 5.6 cm in diameter. The hila and other mediastinal spaces are negative for adenopathy. Lungs/Pleura: Small pleural effusions on both sides. There is subpleural extension of the mass with no visible nodularity along the pleural surface, although limited without contrast. Dependent atelectasis. No worrisome pulmonary nodules. Upper Abdomen: Partial coverage shows left hydronephrosis likely due to the retroperitoneal mass medial to the left psoas on prior CT. Right renal cystic change and atherosclerotic calcification. Cholecystectomy. Musculoskeletal: The posterior mediastinal mass is in close proximity to the spine but does not cause visible erosion or infiltrative lucency. No gross spinal canal mass. No evidence of hematogenous osseous metastases. IMPRESSION: 1. Approximately 9 x 6 cm infiltrative posterior mediastinal mass intimately associated with the descending aorta and thoracic spine. 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 2. Known left hydronephrosis related to infiltrative mass in the retroperitoneum. 3. Small pleural effusions with lower lobe atelectasis. Electronically Signed   By: Monte Fantasia M.D.   On: 11/18/2019 11:27   DG C-Arm 1-60  Min-No Report  Result Date: 11/19/2019 Fluoroscopy was utilized by the requesting physician.  No radiographic interpretation.        Scheduled Meds: . acetaminophen  1,000 mg Oral TID  . aspirin EC  81 mg Oral Daily  . atorvastatin  10 mg Oral Daily  . calcium-vitamin D  1 tablet Oral BID  . donepezil  5 mg Oral QHS  . levothyroxine  75 mcg Oral Q breakfast  . loratadine  10 mg Oral Daily  . pantoprazole  40 mg Oral Daily  . cyanocobalamin  1,000 mcg Oral Daily  . zinc sulfate  220 mg Oral Daily   Continuous Infusions:    LOS: 2 days     Time spent: 25 minutes    Edwin Dada, MD [facility] 11/19/2019, 4:25 PM     Please page though [place] or Epic secure chat:  For [employer], Adult nurse

## 2019-11-19 NOTE — Op Note (Addendum)
Date of procedure: 11/19/19  Preoperative diagnosis:  1. Left hydronephrosis  Postoperative diagnosis:  1. Same  Procedure: 1. Cystoscopy, left retrograde pyelogram with intraoperative interpretation, left ureteral stent placement  Surgeon: Nickolas Madrid, MD  Anesthesia: General  Complications: None  Intraoperative findings:  1.  Normal cystoscopy, ureteral orifices orthotopic 2.  Left retrograde pyelogram showing no passage of contrast into the left kidney secondary to extrinsic compression from retroperitoneal mass 3.  Uncomplicated left ureteral stent placement  EBL: None  Specimens: None  Drains: Left 6 French by 26 cm Bard Optima stent  Indication: Regina Harris is a 84 y.o. patient with left-sided flank pain, AKI, and a periaortic mass with external compression of the proximal left ureter causing upstream hydronephrosis.  After reviewing the management options for treatment, they elected to proceed with the above surgical procedure(s). We have discussed the potential benefits and risks of the procedure, side effects of the proposed treatment, the likelihood of the patient achieving the goals of the procedure, and any potential problems that might occur during the procedure or recuperation. Informed consent has been obtained.  Description of procedure:  The patient was taken to the operating room and general anesthesia was induced. SCDs were placed for DVT prophylaxis. The patient was placed in the dorsal lithotomy position, prepped and draped in the usual sterile fashion, and preoperative antibiotics(ampicillin) were administered. A preoperative time-out was performed.   A 21 French rigid cystoscope was used to intubate the urethra.  Thorough cystoscopy was performed and showed some cystitis but no bladder tumors or other suspicious lesions.  The ureteral orifices were orthotopic bilaterally.  A left retrograde pyelogram was performed which showed no passage of contrast into  the left kidney secondary to extrinsic compression at the proximal ureter.  A sensor wire was able to be advanced through the access catheter up into the left kidney under fluoroscopic vision.  The access catheter was then advanced over the wire, the wire removed, and a hydronephrotic drip of yellow urine was noted.  Contrast was injected and opacified a dilated collecting system showing moderate hydronephrosis with no other filling defects or abnormalities.  A 6 French by 26 cm Bard Optima stent was uneventfully placed over the wire with a curl in the renal pelvis as well as under direct vision in the bladder.  There was brisk drainage of fluid through the side-ports of the stent in the bladder.  A Foley was placed to maximize drainage in the immediate postoperative period.  Disposition: Stable to PACU  Plan: Agree with biopsy and workup of retroperitoneal mass Okay to remove Foley when renal function normalized  We will arrange follow-up in urology clinic in 2 to 3 months for symptom check and to discuss stent exchange vs removal  Nickolas Madrid, MD

## 2019-11-19 NOTE — H&P (Signed)
UROLOGY H&P UPDATE  Agree with prior H&P 1/13 by Dr. Erlene Quan.  84 year old female with mild left-sided hydroureter nephrosis likely secondary to a infiltrative mass encasing the distal thoracic aorta.  Renal function has not improved with hydration and observation, and it was felt left ureteral stent placement was warranted.  Laterality: Left Procedure: Cystoscopy, left retrograde pyelogram, left ureteral stent placement  Urine:  Urinalysis 11/17/2019 benign with 0 RBCs, 0 WBCs, no bacteria, nitrite negative, no leukocytes Urine culture 11/10/2019 Enterococcus  Informed consent obtained, we specifically discussed the risks of bleeding, infection, post-operative pain, need for additional procedures, stent related symptoms including urgency/frequency/dysuria/flank pain, unclear duration of stent placement, need for stent changes in the future.  Billey Co, MD 11/19/2019

## 2019-11-19 NOTE — Transfer of Care (Signed)
Immediate Anesthesia Transfer of Care Note  Patient: Regina Harris  Procedure(s) Performed: CYSTOSCOPY WITH URETEROSCOPY AND STENT PLACEMENT (Left )  Patient Location: PACU  Anesthesia Type:General  Level of Consciousness: drowsy  Airway & Oxygen Therapy: Patient Spontanous Breathing and Patient connected to face mask oxygen  Post-op Assessment: Report given to RN and Post -op Vital signs reviewed and stable  Post vital signs: Reviewed and stable  Last Vitals:  Vitals Value Taken Time  BP 114/55 11/19/19 1516  Temp    Pulse    Resp 14 11/19/19 1518  SpO2    Vitals shown include unvalidated device data.  Last Pain:  Vitals:   11/19/19 1423  TempSrc: Tympanic  PainSc: 3          Complications: No apparent anesthesia complications

## 2019-11-19 NOTE — Discharge Instructions (Signed)
[facility] follow up with [facility]   with dr Erlene Quan on April 14th at 2:15

## 2019-11-19 NOTE — Anesthesia Preprocedure Evaluation (Signed)
Anesthesia Evaluation  Patient identified by MRN, date of birth, ID band Patient awake    Reviewed: Allergy & Precautions, H&P , NPO status , reviewed documented beta blocker date and time   Airway Mallampati: III  TM Distance: >3 FB Neck ROM: limited    Dental  (+) Edentulous Upper, Edentulous Lower   Pulmonary former smoker,    Pulmonary exam normal        Cardiovascular hypertension, Normal cardiovascular exam     Neuro/Psych    GI/Hepatic hiatal hernia, GERD  Controlled,  Endo/Other  diabetesHypothyroidism Hyperthyroidism   Renal/GU Renal disease     Musculoskeletal   Abdominal   Peds  Hematology   Anesthesia Other Findings Past Medical History: No date: Barrett esophagus No date: Carotid bruit No date: Diabetes mellitus without complication ([place]) No date: Esophagitis 2013: Gastritis No date: GERD (gastroesophageal reflux disease) No date: HH (hiatus hernia) No date: History of colonic polyps No date: History of repair of right rotator cuff No date: Hyperlipidemia No date: Hypertension 06/09/2018: Hyperthyroidism     Comment:  Per [facility] RAI Therapy for Hyperthyroidism order No date: Lump or mass in breast No date: Pneumonia No date: Polycythemia, secondary No date: Tobacco abuse Past Surgical History: No date: ABDOMINAL HYSTERECTOMY No date: APPENDECTOMY 1996: BREAST BIOPSY; Right 10/09/2012: BREAST BIOPSY; Left     Comment:  Benign breast tissue with focal fat necrosis and focal               periductal chronic inflammation. 10/09/2012: BREAST BIOPSY; Left     Comment:  Benign breast tissue with focal fat necrosis and focal               periductal chronic inflammation. 3/11: CATARACT EXTRACTION     Comment:  Dr Charise Killian No date: CHOLECYSTECTOMY 2009: COLONOSCOPY 11/02/2015: ESOPHAGOGASTRODUODENOSCOPY (EGD) WITH PROPOFOL; N/A     Comment:  Procedure: ESOPHAGOGASTRODUODENOSCOPY (EGD) WITH     PROPOFOL;  Surgeon: Manya Silvas, MD;  Location: [facility]              ENDOSCOPY;  Service: Endoscopy;  Laterality: N/A; 03/05/2016: SHOULDER ARTHROSCOPY WITH OPEN ROTATOR CUFF REPAIR; Right     Comment:  Procedure: SHOULDER ARTHROSCOPY WITH OPEN ROTATOR CUFF               REPAIR;  Surgeon: Earnestine Leys, MD;  Location: [facility];              Service: Orthopedics;  Laterality: Right; 2013: UPPER GI ENDOSCOPY BMI    Body Mass Index: 31.32 kg/m     Reproductive/Obstetrics                             Anesthesia Physical Anesthesia Plan  ASA: III  Anesthesia Plan: General LMA   Post-op Pain Management:    Induction: Intravenous  PONV Risk Score and Plan: 3 and Ondansetron, Treatment may vary due to age or medical condition and Midazolam  Airway Management Planned: LMA  Additional Equipment:   Intra-op Plan:   Post-operative Plan: Extubation in OR  Informed Consent: I have reviewed the patients History and Physical, chart, labs and discussed the procedure including the risks, benefits and alternatives for the proposed anesthesia with the patient or authorized representative who has indicated his/her understanding and acceptance.     Dental Advisory Given  Plan Discussed with: CRNA  Anesthesia Plan Comments: (Posterior mediastinal mass, no pulmonary complaints, can lie flat without symptoms)  Anesthesia Quick Evaluation  

## 2019-11-19 NOTE — Progress Notes (Signed)
PT Cancellation Note  Patient Details Name: Regina Harris MRN: [id number] DOB: 09-Jul-1936   Cancelled Treatment:    Reason Eval/Treat Not Completed: Patient at procedure or test/unavailable.  PT consult received.  Pt currently off unit for procedure and not available for PT session.  Will re-attempt PT evaluation at a later date/time as medically appropriate.  Leitha Bleak, PT 11/19/19, 2:06 PM

## 2019-11-19 NOTE — Anesthesia Procedure Notes (Signed)
Procedure Name: LMA Insertion Date/Time: 11/19/2019 2:40 PM Performed by: Jerrye Noble, CRNA Pre-anesthesia Checklist: Patient identified, Emergency Drugs available, Suction available and Patient being monitored Patient Re-evaluated:Patient Re-evaluated prior to induction Oxygen Delivery Method: Circle system utilized Preoxygenation: Pre-oxygenation with 100% oxygen Induction Type: IV induction Ventilation: Mask ventilation without difficulty LMA: LMA inserted LMA Size: 3.5 Number of attempts: 1 Tube secured with: Tape Dental Injury: Teeth and Oropharynx as per pre-operative assessment

## 2019-11-19 NOTE — Progress Notes (Signed)
Dr Andree Elk in to see pt  Fingers bluish but a cool    Dr Andree Elk aware with no new orders

## 2019-11-19 NOTE — Progress Notes (Signed)
Blood sugar 77 no new orders from dr Andree Elk

## 2019-11-20 ENCOUNTER — Inpatient Hospital Stay: Payer: Medicare Other

## 2019-11-20 ENCOUNTER — Encounter: Payer: Self-pay | Admitting: Internal Medicine

## 2019-11-20 LAB — BASIC METABOLIC PANEL
Anion gap: 7 (ref 5–15)
BUN: 19 mg/dL (ref 8–23)
CO2: 22 mmol/L (ref 22–32)
Calcium: 8.7 mg/dL — ABNORMAL LOW (ref 8.9–10.3)
Chloride: 111 mmol/L (ref 98–111)
Creatinine, Ser: 1.03 mg/dL — ABNORMAL HIGH (ref 0.44–1.00)
GFR calc Af Amer: 59 mL/min — ABNORMAL LOW (ref >60)
GFR calc non Af Amer: 51 mL/min — ABNORMAL LOW (ref >60)
Glucose, Bld: 122 mg/dL — ABNORMAL HIGH (ref 70–99)
Potassium: 4 mmol/L (ref 3.5–5.1)
Sodium: 140 mmol/L (ref 135–145)

## 2019-11-20 LAB — CBC
HCT: 42.3 % (ref 36.0–46.0)
Hemoglobin: 14 g/dL (ref 12.0–15.0)
MCH: 31.3 pg (ref 26.0–34.0)
MCHC: 33.1 g/dL (ref 30.0–36.0)
MCV: 94.6 fL (ref 80.0–100.0)
Platelets: 147 10*3/uL — ABNORMAL LOW (ref 150–400)
RBC: 4.47 MIL/uL (ref 3.87–5.11)
RDW: 11.3 % — ABNORMAL LOW (ref 11.5–15.5)
WBC: 9.7 10*3/uL (ref 4.0–10.5)
nRBC: 0 % (ref 0.0–0.2)

## 2019-11-20 MED ORDER — MIDAZOLAM HCL 2 MG/2ML IJ SOLN
INTRAMUSCULAR | Status: AC
  Filled 2019-11-20: qty 2

## 2019-11-20 MED ORDER — MIDAZOLAM HCL 2 MG/2ML IJ SOLN
INTRAMUSCULAR | Status: AC | PRN
  Administered 2019-11-20: 1 mg via INTRAVENOUS

## 2019-11-20 MED ORDER — ACETAMINOPHEN 500 MG PO TABS: 1000.0000 mg | ORAL_TABLET | Freq: Three times a day (TID) | ORAL | 0 refills | Status: AC

## 2019-11-20 MED ORDER — FENTANYL CITRATE (PF) 100 MCG/2ML IJ SOLN
INTRAMUSCULAR | Status: AC
  Filled 2019-11-20: qty 2

## 2019-11-20 MED ORDER — FENTANYL CITRATE (PF) 100 MCG/2ML IJ SOLN
INTRAMUSCULAR | Status: AC | PRN
  Administered 2019-11-20: 50 ug via INTRAVENOUS

## 2019-11-20 NOTE — Evaluation (Signed)
Physical Therapy Evaluation Patient Details Name: Regina Harris MRN: [id number] DOB: Jan 07, 1936 Today's Date: 11/20/2019   History of Present Illness  Pt is an 84 y.o. female presenting to hospital 11/17/19 with LLQ abdominal pain and loose stools; recent diagnosis UTI.  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  S/p cystoscopy, L retrograde pyelogram, and L ureteral stent placement 11/19/19.  S/p CT guided biopsy of L sided retroperitoneal mass 11/20/19.  PMH includes DM, h/o R RCR, htn, PNA, LBP, memory loss, poor balance, h/o R knee pain.  Clinical Impression  Prior to hospital admission, pt was ambulatory with rollator and lives alone but has 24/7 sitters to assist as needed.  Currently pt is modified independent with bed mobility; CGA with transfers; CGA ambulating 60 feet with RW (pt appears able to walk further but therapist limited distance d/t pt normally does not walk that far at one time at home); and CGA navigating 4 steps with B UE support.  Pt would benefit from skilled PT to address noted impairments and functional limitations (see below for any additional details).  Upon hospital discharge, pt would benefit from [place] (pt's granddaughter interested but unsure if they want to start HHPT d/t pt may have a lot coming up and might be too much--CM notified).      Follow Up Recommendations Home health PT;Supervision for mobility/OOB    Equipment Recommendations  (pt appears appropriate for rollator use (pt has rollator at home already))    Recommendations for Other Services       Precautions / Restrictions Precautions Precautions: Fall Restrictions Weight Bearing Restrictions: No      Mobility  Bed Mobility Overal bed mobility: Modified Independent             General bed mobility comments: Supine to/from sit with mild increased effort  Transfers Overall transfer level: Needs assistance Equipment used: Rolling walker (2  wheeled) Transfers: Sit to/from Stand Sit to Stand: Min guard         General transfer comment: fairly strong stand up to walker x2 trials from bed; x1 trial from recliner  Ambulation/Gait Ambulation/Gait assistance: Min guard Gait Distance (Feet): 60 Feet(plus 20 feet to bathroom) Assistive device: Rolling walker (2 wheeled)   Gait velocity: decreased   General Gait Details: decreased stance time R LE; fairly good step length R LE but decreased L LE step length; pt tending to push walker further ahead (anticipate d/t pt usually uses rollator)  Stairs Stairs: Yes Stairs assistance: Min guard(2nd assist present for safety but not required) Stair Management: Two rails;Step to pattern;Forwards Number of Stairs: 4 General stair comments: steady and safe stairs navigation  Wheelchair Mobility    Modified Rankin (Stroke Patients Only)       Balance Overall balance assessment: Needs assistance Sitting-balance support: No upper extremity supported;Feet supported Sitting balance-Leahy Scale: Normal Sitting balance - Comments: steady sitting reaching outside BOS   Standing balance support: No upper extremity supported Standing balance-Leahy Scale: Fair Standing balance comment: steady static standing (pt requires single UE support for dynamic standing activities)                             Pertinent Vitals/Pain Pain Assessment: No/denies pain Pain Intervention(s): Monitored during session;Limited activity within patient's tolerance;Repositioned  O2 sats 89% or greater on room air during session's activities and HR WFL during session's activities.    Home Living Family/patient expects to be  discharged to:: Private residence Living Arrangements: Alone Available Help at Discharge: Other (Comment)(Sitters 24/7 to assist pt) Type of Home: House Home Access: Stairs to enter   [employer] of Steps: 1 step with B railings plus 1 step(can hold onto  door-frame) Home Layout: One level Home Equipment: Walker - 4 wheels;Toilet riser;Tub bench      Prior Function Level of Independence: Needs assistance   Gait / Transfers Assistance Needed: Ambulatory with rollator; no falls in past 6 months  ADL's / Homemaking Assistance Needed: Has sitter 24/7 to assist with meals and self care        Hand Dominance        Extremity/Trunk Assessment   Upper Extremity Assessment Upper Extremity Assessment: Generalized weakness    Lower Extremity Assessment Lower Extremity Assessment: Generalized weakness       Communication   Communication: HOH  Cognition Arousal/Alertness: Awake/alert Behavior During Therapy: WFL for tasks assessed/performed                                   General Comments: Oriented to person.  Confusion noted.  Granddaughter present clarifying information as needed.      General Comments   Nursing cleared pt for participation in physical therapy.  Pt agreeable to PT session.  Pt's granddaughter present during most of session (except stairs).  End of session pt was already laying in bed but then pt requesting to go to the bathroom; pt incontinent of stool walking to bathroom requiring clean-up (NT present and assisting pt).    Exercises     Assessment/Plan    PT Assessment Patient needs continued PT services  PT Problem List Decreased strength;Decreased activity tolerance;Decreased balance;Decreased mobility       PT Treatment Interventions DME instruction;Gait training;Stair training;Functional mobility training;Therapeutic activities;Therapeutic exercise;Balance training;Patient/family education    PT Goals (Current goals can be found in the Care Plan section)  Acute Rehab PT Goals Patient Stated Goal: to go home today PT Goal Formulation: With patient/family Time For Goal Achievement: 12/04/19 Potential to Achieve Goals: Good    Frequency Min 2X/week   Barriers to discharge         Co-evaluation               AM-PAC PT "6 Clicks" Mobility  Outcome Measure Help needed turning from your back to your side while in a flat bed without using bedrails?: None Help needed moving from lying on your back to sitting on the side of a flat bed without using bedrails?: None Help needed moving to and from a bed to a chair (including a wheelchair)?: A Little Help needed standing up from a chair using your arms (e.g., wheelchair or bedside chair)?: A Little Help needed to walk in hospital room?: A Little Help needed climbing 3-5 steps with a railing? : A Little 6 Click Score: 20    End of Session Equipment Utilized During Treatment: Gait belt Activity Tolerance: Patient tolerated treatment well Patient left: (sitting on toilet with NT present assisting pt with clean-up and getting pt dressed) Nurse Communication: Mobility status;Precautions PT Visit Diagnosis: Other abnormalities of gait and mobility (R26.89);Muscle weakness (generalized) (M62.81)    Time: 6283-1517 PT Time Calculation (min) (ACUTE ONLY): 35 min   Charges:   PT Evaluation $PT Eval Low Complexity: 1 Low PT Treatments $Therapeutic Activity: 8-22 mins       Leitha Bleak, PT 11/20/19, 4:22 PM

## 2019-11-20 NOTE — Progress Notes (Signed)
Patient clinically stable post Retroperitoneal Biopsy per DR Pascal Lux, tolerated well. bandade dressing to back/left dry and intact. Alert and oriented post procedure. Daughter Hoyle Sauer with mother, Dr Pascal Lux out to speak with family with questions answered. Received Versed 1mg  along with Fentany 47mcg IV for procedure. Report given to [place] on 1A/144 with questions answered.

## 2019-11-20 NOTE — Care Management Important Message (Signed)
Important Message  Patient Details  Name: Regina Harris MRN: [id number] Date of Birth: 11-07-1935   Medicare Important Message Given:  Yes     Juliann Pulse A Theresea Trautmann 11/20/2019, 11:45 AM

## 2019-11-20 NOTE — Progress Notes (Signed)
PT Cancellation Note  Patient Details Name: TANNA LOEFFLER MRN: [id number] DOB: 1936/10/02   Cancelled Treatment:    Reason Eval/Treat Not Completed: Patient at procedure or test/unavailable.  Pt currently off unit at procedure (per discussion with pt's nurse).  Will re-attempt PT evaluation at a later date/time as medically appropriate.  Of note, pt s/p procedure yesterday and per chart review underwent general anesthesia.  Per PT guidelines for general anesthesia, therapy will require new PT consult post op in order to initiate PT evaluation (nurse notified).  Leitha Bleak, PT 11/20/19, 9:09 AM

## 2019-11-20 NOTE — TOC Progression Note (Signed)
Transition of Care [facility]) - Progression Note    Patient Details  Name: SIDNEE GAMBRILL MRN: [id number] Date of Birth: 07-02-1936  [facility] CM/SW [place], RN Phone Number: 11/20/2019, 4:02 PM  Clinical Narrative:    The patient has 24/7 caregiver at home Met with the patient and her grand daughte rin the room, they have used [facility] in the past for the patient's husband, They would like to use [facility] She has a rolator at home and a [facility] and does not need DME I notified Corene Cornea at [facility]   Expected Discharge Plan: [place] Barriers to Discharge: Barriers Resolved  Expected Discharge Plan and Services Expected Discharge Plan: [place]   Discharge Planning Services: CM Consult   Living arrangements for the past 2 months: Single Family Home Expected Discharge Date: 11/20/19               DME Arranged: N/A                     Social Determinants of Health (SDOH) Interventions    Readmission Risk Interventions No flowsheet data found.

## 2019-11-20 NOTE — Progress Notes (Signed)
Urology Inpatient Progress Note  Subjective: Regina Harris is a 84 y.o. female admitted on 11/17/2019 with LLQ pain and subsequently found to have a periaortal mass with concern for possible underlying malignancy as well as diverticulitis, pancreatitis, mild left hydroureteronephrosis, and AKI.  She is now POD 1 from left ureteral stent placement with Dr. Diamantina Providence.  Intraoperative findings consistent with extrinsic ureteral compression from retroperitoneal mass.    She underwent CT-guided biopsy of her left-sided retroperitoneal mass with IR this morning.  No complications noted.  Creatinine down today, 1.03.  Foley DC'd this morning.  She is voiding spontaneously.  She reports no acute concerns today.  She states she feels well.  Anti-infectives: Anti-infectives (From admission, onward)   Start     Dose/Rate Route Frequency Ordered Stop   11/19/19 1430  ampicillin (OMNIPEN) 1 g in sodium chloride 0.9 % 100 mL IVPB     1 g 300 mL/hr over 20 Minutes Intravenous  Once 11/19/19 1417 11/20/19 0812   11/18/19 0830  amoxicillin (AMOXIL) capsule 500 mg  Status:  Discontinued     500 mg Oral Every 8 hours 11/18/19 0817 11/18/19 1205   11/17/19 1845  amoxicillin-clavulanate (AUGMENTIN) 875-125 MG per tablet 1 tablet     1 tablet Oral  Once 11/17/19 1832 11/17/19 1843   11/17/19 1845  metroNIDAZOLE (FLAGYL) tablet 500 mg     500 mg Oral  Once 11/17/19 1832 11/17/19 1843      Current Facility-Administered Medications  Medication Dose Route Frequency Provider Last Rate Last Admin  . acetaminophen (TYLENOL) tablet 1,000 mg  1,000 mg Oral TID Edwin Dada, MD   1,000 mg at 11/20/19 1044  . albuterol (PROVENTIL) (2.5 MG/3ML) 0.083% nebulizer solution 2.5 mg  2.5 mg Nebulization Q6H PRN Clance Boll, MD      . aspirin EC tablet 81 mg  81 mg Oral Daily Myles Rosenthal A, MD   81 mg at 11/20/19 1044  . atorvastatin (LIPITOR) tablet 10 mg  10 mg Oral Daily Myles Rosenthal A, MD   10 mg  at 11/19/19 1810  . calcium-vitamin D (OSCAL WITH D) 500-200 MG-UNIT per tablet 1 tablet  1 tablet Oral BID Clance Boll, MD   1 tablet at 11/20/19 1046  . Chlorhexidine Gluconate Cloth 2 % PADS 6 each  6 each Topical Daily Danford, Christopher P, MD      . donepezil (ARICEPT) tablet 5 mg  5 mg Oral QHS Myles Rosenthal A, MD   5 mg at 11/19/19 2039  . fentaNYL (SUBLIMAZE) 100 MCG/2ML injection           . levothyroxine (SYNTHROID) tablet 75 mcg  75 mcg Oral Q breakfast Clance Boll, MD   75 mcg at 11/18/19 0623  . loratadine (CLARITIN) tablet 10 mg  10 mg Oral Daily Myles Rosenthal A, MD   10 mg at 11/20/19 1044  . midazolam (VERSED) 2 MG/2ML injection           . ondansetron (ZOFRAN) tablet 4 mg  4 mg Oral Q6H PRN Clance Boll, MD       Or  . ondansetron [facility]) injection 4 mg  4 mg Intravenous Q6H PRN Clance Boll, MD   4 mg at 11/18/19 0717  . oxyCODONE (Oxy IR/ROXICODONE) immediate release tablet 5 mg  5 mg Oral Q4H PRN Edwin Dada, MD   5 mg at 11/20/19 0634  . pantoprazole (PROTONIX) EC tablet 40 mg  40 mg  Oral Daily Myles Rosenthal A, MD   40 mg at 11/20/19 1044  . senna-docusate (Senokot-S) tablet 1 tablet  1 tablet Oral QHS PRN Clance Boll, MD      . vitamin B-12 (CYANOCOBALAMIN) tablet 1,000 mcg  1,000 mcg Oral Daily Myles Rosenthal A, MD   1,000 mcg at 11/20/19 1045  . zinc sulfate capsule 220 mg  220 mg Oral Daily Myles Rosenthal A, MD   220 mg at 11/20/19 1044     Objective: Vital signs in last 24 hours: Temp:  [97.7 F (36.5 C)-99.2 F (37.3 C)] 97.9 F (36.6 C) (01/14 2059) Pulse Rate:  [71-88] 77 (01/15 0920) Resp:  [12-20] 14 (01/15 0935) BP: (114-154)/(55-99) 124/78 (01/15 0935) SpO2:  [94 %-100 %] 95 % (01/15 1101)  Intake/Output from previous day: 01/14 0701 - 01/15 0700 In: 200 [I.V.:100; IV Piggyback:100] Out: 2200 [Urine:2200] Intake/Output this shift: Total I/O In: -  Out: 300  [Urine:300]  Physical Exam Vitals and nursing note reviewed.  Constitutional:      General: She is not in acute distress.    Appearance: She is not ill-appearing, toxic-appearing or diaphoretic.  HENT:     Head: Normocephalic and atraumatic.  Pulmonary:     Effort: Pulmonary effort is normal. No respiratory distress.  Skin:    General: Skin is warm and dry.  Neurological:     Mental Status: She is alert. Mental status is at baseline.  Psychiatric:        Mood and Affect: Mood normal.        Behavior: Behavior normal.    Lab Results:  Recent Labs    11/19/19 0342 11/20/19 0618  WBC 8.2 9.7  HGB 13.9 14.0  HCT 42.5 42.3  PLT 137* 147*   BMET Recent Labs    11/19/19 0342 11/20/19 0618  NA 138 140  K 3.8 4.0  CL 108 111  CO2 23 22  GLUCOSE 128* 122*  BUN 15 19  CREATININE 1.60* 1.03*  CALCIUM 8.4* 8.7*   PT/INR Recent Labs    11/19/19 0342  LABPROT 16.0*  INR 1.3*   Assessment & Plan: 84 year old female admitted with new periaortic mass, diverticulitis, pancreatitis, mild left hydroureteronephrosis, and AKI.  Creatinine significantly improved following left ureteral stent placement yesterday, Foley out and spontaneously voiding.  Biopsy obtained this morning.  She is scheduled for outpatient follow-up in our clinic in 3 months for symptom recheck.  She will retain stent until that time.  At that point, we will consider plan for removing versus retaining stent long-term given results of her biopsy and treatment plan.  Debroah Loop, PA-C 11/20/2019

## 2019-11-20 NOTE — Discharge Summary (Addendum)
Physician Discharge Summary  NOHELY WHITEHORN BPZ:[id number] DOB: May 16, 1936 DOA: 11/17/2019  PCP: Abner Greenspan, MD  Admit date: 11/17/2019 Discharge date: 11/20/2019  Admitted From: Home  Disposition:  Home   Recommendations for Outpatient Follow-up:  1. Follow up with Dr. Grayland Ormond next week 2. Follow up with Urology Dr. Diamantina Providence in 3 months 3. Dr. Grayland Ormond: Please obtain BMP in one week     Home Health: PT due to ongoing weakness from AKI making it unsafe for patient to leave home alone  Equipment/Devices: None  Discharge Condition: Fair  CODE STATUS: FULL Diet recommendation: Diabetic  Brief/Interim Summary: Mrs. Wedin is a 84 y.o. F with DM, HTN, hypothyroidism and recent Covid infection, still quite debilitated who presented with acute abdominal pain for 1 hour.  Patient has been slowly recuperating from [place] last October, at home, when she developed severe left lower abdominal pain 1 hour PTA.  In the ER, she was afebrile, heart rate and blood pressure normal.  Creatinine 1.7 from baseline 0.9.  WBC normal.  Hemoglobin stable.  CT of the abdomen and pelvis showed possible mild diverticulitis, mild left hydronephrosis, mild stranding distal pancreas, and infiltrative soft tissue mass along the lower thoracic spine at T10-11 with encasement of the aorta.        PRINCIPAL HOSPITAL DIAGNOSIS: Acute kidney injury     Discharge Diagnoses:   Acute kidney injury, obstructive uropathy Patient presented with left sided pain, radiating into groin.  Creatinine 1.7 up from baseline of 0.9 mg/dL.  FeNA >2% and did not improve with fluids.    There was a question of divertiulitis or pancreatitis as the cause of her abdominal pain, because the admission CT of the abdomen and pelvis suggested these findings.  However she had no leukocytosis, chills or fever, so diverticulitis was clinically ruled out.  Furthermore she had elevated lipase, but no epigastric tenderness, vomiting, or pain  with eating, so pancreatitis was clinically ruled out.    Subsequently, her creatinine does not improve with fluids, and her left-sided pain remained, so urology were consulted, ureteral stent was placed, her creatinine improved to normal, and her pain completely resolved.  She had no fever after stent placement, was able to void without difficulty, her Foley was removed, and he was she was discharged to follow-up in 3 months with urology Dr. Diamantina Providence.    Posterior mediastinal soft tissue mass CT abdomen and pelvis and CT chest showed a 9 cm posterior mediastinal mass at T10-T11 with encasement of the aorta as well as 3 cm left para-aortic soft tissue density adjacent to L4, impinging the left ureter, most consistent with lymphoma.    IR were consulted and biopsy was obtained with CT guidance.  Oncology were consulted, and will follow up biopsy results next Tuesday with Dr. Grayland Ormond.  Recent UTI Enterococcus growing in urine from 1 week ago.  Finished 5 days Macrobid.  Adequate.  Remained afebrile here.   Diabetes Diet controlled. Continue aspirin and statin.  Hypertension Resume diuretic next week  Hypothyroidism Continue levothyroxine  Dementia Continue donepezil              Discharge Instructions  Discharge Instructions    Discharge instructions   Complete by: As directed    From Dr. Loleta Books: You were admitted for abdominal pain.   In retrospect, this appears to have been from the mass next to your spine which is compressing the ureter.  Dr. Diamantina Providence put a stent there.  If the stent causes  irritation or pain, take acetaminophen up to 3000 or 4000 mg per day (that would be 2 extra strength tabs, 1000 mg up to three or four times per day)  If you still have pain, I think it is reasonable to take 2 ibuprofen, use sparingly.  Have Dr. Grayland Ormond check your kidney function in a week or two Follow up with Dr. Diamantina Providence as directed  Resume all your other home  medicines One exception, hold off on taking your furosemide until Monday   Increase activity slowly   Complete by: As directed      Allergies as of 11/20/2019      Reactions   Benadryl [diphenhydramine] Other (See Comments)   Hallucinations; family request to not give this medication.      Medication List    STOP taking these medications   nitrofurantoin (macrocrystal-monohydrate) 100 MG capsule Commonly known as: Macrobid     TAKE these medications   acetaminophen 500 MG tablet Commonly known as: TYLENOL Take 2 tablets (1,000 mg total) by mouth 3 (three) times daily.   aspirin EC 81 MG tablet Take 81 mg by mouth daily.   atorvastatin 10 MG tablet Commonly known as: LIPITOR TAKE 1 TABLET BY MOUTH ONCE DAILY   Calcium 1000 + D 1000-800 MG-UNIT Tabs Generic drug: Calcium Carb-Cholecalciferol Take 1 tablet by mouth 2 (two) times daily.   cyanocobalamin 1000 MCG tablet Take 1,000 mcg by mouth daily.   donepezil 5 MG tablet Commonly known as: ARICEPT Take 1 tablet (5 mg total) by mouth at bedtime.   furosemide 20 MG tablet Commonly known as: LASIX TAKE 1 TABLET BY MOUTH ONCE A DAY   gabapentin 400 MG capsule Commonly known as: NEURONTIN TAKE 1 CAPSULE BY MOUTH TWICE DAILY   levothyroxine 75 MCG tablet Commonly known as: SYNTHROID Take 75 mcg by mouth daily with breakfast.   loratadine 10 MG tablet Commonly known as: CLARITIN Take 10 mg by mouth daily.   omeprazole 20 MG capsule Commonly known as: PRILOSEC TAKE 1 CAPSULE BY MOUTH TWICE DAILY   potassium chloride SA 20 MEQ tablet Commonly known as: KLOR-CON TAKE 1 TABLET BY MOUTH TWICE (2) DAILY What changed: See the new instructions.   zinc sulfate 220 (50 Zn) MG capsule Take 1 capsule (220 mg total) by mouth daily.      Follow-up Information    Billey Co, MD. Schedule an appointment as soon as possible for a visit in 2 week(s).   Specialty: Urology Contact information: [place] [address] [phone]        Lloyd Huger, MD Follow up.   Specialty: Oncology Why: Attend telemedicine visit next Tuesday Contact information: [place] [address] [phone]          Allergies  Allergen Reactions  . Benadryl [Diphenhydramine] Other (See Comments)    Hallucinations; family request to not give this medication.    Consultations:  Oncology  Interventional radiology  Urology   Procedures/Studies: CT ABDOMEN PELVIS WO CONTRAST  Result Date: 11/17/2019 CLINICAL DATA:  84 year old female with left-sided abdominal pain. Concern for acute diverticulitis. EXAM: CT ABDOMEN AND PELVIS WITHOUT CONTRAST TECHNIQUE: Multidetector CT imaging of the abdomen and pelvis was performed following the standard protocol without IV contrast. COMPARISON:  CT abdomen pelvis dated 06/13/2006. FINDINGS: Evaluation of this exam is limited in the absence of intravenous contrast. Lower chest: Trace left pleural effusion. There is thickened appearance of the posteromedial lower lobe pleural surfaces.  Partially visualized infiltrative mass encasing the distal descending thoracic aorta and abutting the lower thoracic spine at T10-T11. This may represent an infiltrative neoplasm, lymphoma, or fibrosis versus sequela osteomyelitis/discitis. This is new since the CT of 07/17/2010. Further evaluation with MRI without and with contrast is recommended. Bibasilar subpleural linear and streaky densities may represent atelectasis/scarring. Infiltrate is less likely. Clinical correlation is recommended. There is no intra-abdominal free air or free fluid. Hepatobiliary: The liver is unremarkable. No intrahepatic biliary ductal dilatation. Cholecystectomy. No retained calcified stone noted in the central CBD. Pancreas: There is mild haziness of the distal pancreas which may represent acute pancreatitis. Correlation with pancreatic enzymes recommended. No drainable  fluid collection/abscess or pseudocyst Spleen: Normal in size without focal abnormality. Adrenals/Urinary Tract: There is a 2 cm left adrenal adenoma. The right adrenal gland is unremarkable. There is mild left hydronephrosis. No calcified stone identified. There is haziness and stranding of the proximal left periureteric fat which may represent an infection/inflammatory process or fibrosis and possible degree of ureteral stricture. There is a 2 cm left renal inferior pole hypodense lesion which is not characterized but demonstrates fluid attenuation most consistent with a cyst. There is no hydronephrosis or nephrolithiasis on the right. Multiple right renal cysts measure up to approximately 7 cm. The right ureter and urinary bladder appear unremarkable. Stomach/Bowel: There is sigmoid diverticulosis with muscular hypertrophy. Mild perisigmoid stranding and haziness may represent mild acute diverticulitis. Clinical correlation is recommended. There is diffuse thickened appearance of the colon which may be related to underdistention. Diffuse submucosal fat deposit along the colonic wall likely sequela of chronic inflammatory process. There is no bowel obstruction. Appendectomy. Vascular/Lymphatic: Advanced aortoiliac atherosclerotic disease. There is stranding of the periaortic fat. There is a 3.8 x 2.1 cm left para-aortic/paraspinal soft tissue lesion which is not characterized but may represent enlarged lymph node (series 2 image 38). However, this soft tissue appears to abut the left L4 vertebra and therefore a neoplasm or an infectious process is not excluded. No portal venous gas. The IVC is unremarkable. Reproductive: Hysterectomy. Other: None Musculoskeletal: Osteopenia with extensive multilevel degenerative changes, disc desiccation and vacuum phenomena. Old-appearing multilevel compression fractures most prominent involving L3 and L4. No definite acute fracture. No retropulsed fragment. IMPRESSION: 1.  Sigmoid diverticulosis with findings concerning for mild diverticulitis. No diverticular abscess or perforation. 2. No bowel obstruction. 3. Mild left hydronephrosis, possibly secondary to a degree of stricture or decreased peristalsis of the left ureter due to adjacent inflammatory process or fibrosis. No stone. 4. Mild stranding of the distal pancreas. Correlation with pancreatic enzymes recommended to evaluate for acute pancreatitis. No fluid collection. 5. Infiltrative soft tissue mass along the lower thoracic spine at T10-T11 with encasement of the aorta. This may represent an inflammatory/infectious process (osteomyelitis/discitis) or an infiltrative neoplasm. Further evaluation with MRI without and with contrast is recommended. 6. Thickened appearance of the inferior medial lower lobe pleural surfaces which may be secondary to infiltration of paraspinal/periaortic mass or sequela of chronic inflammation. 7. Left para-aortic soft tissue density to the left of L4 may represent adenopathy or a neoplasm. Electronically Signed   By: Anner Crete M.D.   On: 11/17/2019 18:09   CT CHEST WO CONTRAST  Result Date: 11/18/2019 CLINICAL DATA:  Adenopathy EXAM: CT CHEST WITHOUT CONTRAST TECHNIQUE: Multidetector CT imaging of the chest was performed following the standard protocol without IV contrast. COMPARISON:  Chest CT 07/17/2010 FINDINGS: Cardiovascular: Normal heart size. No pericardial effusion. Aortic and coronary atherosclerosis. Mediastinum/Nodes: Infiltrative  posterior mediastinal mass partially encasing the descending aorta with indistinguishable plane between the mass and the posterior wall of the aorta separate from areas of intimal calcification. The infiltrative abnormality extends approximately 9 cm craniocaudal and 5.6 cm in diameter. The hila and other mediastinal spaces are negative for adenopathy. Lungs/Pleura: Small pleural effusions on both sides. There is subpleural extension of the mass with  no visible nodularity along the pleural surface, although limited without contrast. Dependent atelectasis. No worrisome pulmonary nodules. Upper Abdomen: Partial coverage shows left hydronephrosis likely due to the retroperitoneal mass medial to the left psoas on prior CT. Right renal cystic change and atherosclerotic calcification. Cholecystectomy. Musculoskeletal: The posterior mediastinal mass is in close proximity to the spine but does not cause visible erosion or infiltrative lucency. No gross spinal canal mass. No evidence of hematogenous osseous metastases. IMPRESSION: 1. Approximately 9 x 6 cm infiltrative posterior mediastinal mass intimately associated with the descending aorta and thoracic spine. 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 2. Known left hydronephrosis related to infiltrative mass in the retroperitoneum. 3. Small pleural effusions with lower lobe atelectasis. Electronically Signed   By: Monte Fantasia M.D.   On: 11/18/2019 11:27   CT GUIDED NEEDLE PLACEMENT  Result Date: 11/20/2019 INDICATION: No known primary, now with retroperitoneal mass worrisome for lymphoma. Please perform CT-guided biopsy for tissue diagnostic purposes. EXAM: CT GUIDANCE NEEDLE PLACEMENT COMPARISON:  CT abdomen and pelvis-09/16/2020 MEDICATIONS: None. ANESTHESIA/SEDATION: Fentanyl 50 mcg IV; Versed 1 mg IV Sedation time: 16 minutes; The patient was continuously monitored during the procedure by the interventional radiology nurse under my direct supervision. CONTRAST:  None. COMPLICATIONS: None immediate. PROCEDURE: Informed consent was obtained from the patient following an explanation of the procedure, risks, benefits and alternatives. A time out was performed prior to the initiation of the procedure. The patient was positioned prone on the CT table and a limited CT was performed for procedural planning demonstrating unchanged size and appearance the left-sided retroperitoneal mass, presumed nodal conglomeration with dominant  component measuring approximately 4.5 x 2.6 cm (image 21, series 3). The procedure was planned. The operative site was prepped and draped in the usual sterile fashion. Appropriate trajectory was confirmed with a 22 gauge spinal needle after the adjacent tissues were anesthetized with 1% Lidocaine with epinephrine. Under intermittent CT guidance, a 17 gauge coaxial needle was advanced into the peripheral aspect of the mass. Appropriate positioning was confirmed and 6 core needle biopsy samples were obtained with an 18 gauge core needle biopsy device. The co-axial needle was removed following the administration of a Gel-Foam slurry and superficial hemostasis was achieved with manual compression. A limited postprocedural CT was negative for hemorrhage or additional complication. A dressing was placed. The patient tolerated the procedure well without immediate postprocedural complication. IMPRESSION: Technically successful CT guided core needle biopsy of left-sided retroperitoneal mass, presumed nodal conglomeration. Electronically Signed   By: Sandi Mariscal M.D.   On: 11/20/2019 09:43   DG C-Arm 1-60 Min-No Report  Result Date: 11/19/2019 Fluoroscopy was utilized by the requesting physician.  No radiographic interpretation.      Subjective: Patient feeling well.  Appetite good.  Left-sided pain improved/resolved.  Urinating well.  No fever, chills, hematuria.  Discharge Exam: Vitals:   11/20/19 1101 11/20/19 1511  BP:  (!) 117/58  Pulse:  70  Resp:  17  Temp:  (!) 97.5 F (36.4 C)  SpO2: 95% 96%   Vitals:   11/20/19 0930 11/20/19 0935 11/20/19 1101 11/20/19 1511  BP: 137/79 124/78  Marland Kitchen)  117/58  Pulse:    70  Resp: 14 14  17   Temp:    (!) 97.5 F (36.4 C)  TempSrc:    Oral  SpO2: 96% 97% 95% 96%  Weight:      Height:        General: Pt is alert, awake, not in acute distress, appears somewhat tired, lying in bed Cardiovascular: RRR, nl S1-S2, no murmurs appreciated.   No LE edema.    Respiratory: Normal respiratory rate and rhythm.  CTAB without rales or wheezes. Abdominal: Abdomen soft and non-tender.  No distension or HSM.   Neuro/Psych: Strength symmetric in upper and lower extremities.  Judgment and insight appear mildly impaired by dementia.   The results of significant diagnostics from this hospitalization (including imaging, microbiology, ancillary and laboratory) are listed below for reference.     Microbiology: Recent Results (from the past 240 hour(s))  Urine culture     Status: Abnormal   Collection Time: 11/10/19  4:44 PM   Specimen: Urine  Result Value Ref Range Status   MICRO NUMBER: [id number]  Final   SPECIMEN QUALITY: Adequate  Final   Sample Source URINE  Final   STATUS: FINAL  Final   ISOLATE 1: Enterococcus faecalis (A)  Final    Comment: Greater than 100,000 CFU/mL of Enterococcus faecalis      Susceptibility   Enterococcus faecalis - URINE CULTURE POSITIVE 1    AMPICILLIN <=2 Sensitive     VANCOMYCIN 1 Sensitive     NITROFURANTOIN* <=16 Sensitive      * Legend:S = Susceptible  I = IntermediateR = Resistant  NS = Not susceptible* = Not tested  NR = Not reported**NN = See antimicrobic comments  SARS CORONAVIRUS 2 (TAT 6-24 HRS) Nasopharyngeal Nasopharyngeal Swab     Status: None   Collection Time: 11/17/19  7:36 PM   Specimen: Nasopharyngeal Swab  Result Value Ref Range Status   SARS Coronavirus 2 NEGATIVE NEGATIVE Final    Comment: (NOTE) SARS-CoV-2 target nucleic acids are NOT DETECTED. The SARS-CoV-2 RNA is generally detectable in upper and lower respiratory specimens during the acute phase of infection. Negative results do not preclude SARS-CoV-2 infection, do not rule out co-infections with other pathogens, and should not be used as the sole basis for treatment or other patient management decisions. Negative results must be combined with clinical observations, patient history, and epidemiological information. The expected result is  Negative. Fact Sheet for Patients: SugarRoll.be Fact Sheet for Healthcare Providers: https://www.woods-mathews.com/ This test is not yet approved or cleared by the [place] FDA and  has been authorized for detection and/or diagnosis of SARS-CoV-2 by FDA under an Emergency Use Authorization (EUA). This EUA will remain  in effect (meaning this test can be used) for the duration of the COVID-19 declaration under Section 56 4(b)(1) of the Act, 21 U.S.C. section 360bbb-3(b)(1), unless the authorization is terminated or revoked sooner. Performed at [facility], [place] [address], [place], [place] [address]      Labs: BNP (last 3 results) Recent Labs    07/29/19 2055  BNP 664.4*   Basic Metabolic Panel: Recent Labs  Lab 11/17/19 1626 11/18/19 0630 11/19/19 0342 11/20/19 0618  NA 140 137 138 140  K 4.4 4.0 3.8 4.0  CL 104 106 108 111  CO2 26 23 23 22   GLUCOSE 128* 122* 128* 122*  BUN 16 15 15 19   CREATININE 1.69* 1.76* 1.60* 1.03*  CALCIUM 9.3 8.6* 8.4* 8.7*  Liver Function Tests: Recent Labs  Lab 11/17/19 1626 11/18/19 0630 11/19/19 0342  AST 15 13* 10*  ALT 7 7 6   ALKPHOS 61 49 53  BILITOT 0.6 0.6 0.8  PROT 7.6 6.4* 6.6  ALBUMIN 3.4* 2.9* 2.9*   Recent Labs  Lab 11/17/19 1626  LIPASE 111*   No results for input(s): AMMONIA in the last 168 hours. CBC: Recent Labs  Lab 11/17/19 1626 11/18/19 0630 11/19/19 0342 11/20/19 0618  WBC 6.4 6.6 8.2 9.7  HGB 14.3 13.5 13.9 14.0  HCT 44.2 42.7 42.5 42.3  MCV 97.4 97.3 96.2 94.6  PLT 156 140* 137* 147*   Cardiac Enzymes: No results for input(s): CKTOTAL, CKMB, CKMBINDEX, TROPONINI in the last 168 hours. BNP: Invalid input(s): POCBNP CBG: Recent Labs  Lab 11/19/19 1419 11/19/19 1547  GLUCAP 91 77   D-Dimer No results for input(s): DDIMER in the last 72 hours. Hgb A1c No results for input(s): HGBA1C in the last 72 hours. Lipid Profile No results for  input(s): CHOL, HDL, LDLCALC, TRIG, CHOLHDL, LDLDIRECT in the last 72 hours. Thyroid function studies No results for input(s): TSH, T4TOTAL, T3FREE, THYROIDAB in the last 72 hours.  Invalid input(s): FREET3 Anemia work up No results for input(s): VITAMINB12, FOLATE, FERRITIN, TIBC, IRON, RETICCTPCT in the last 72 hours. Urinalysis    Component Value Date/Time   COLORURINE YELLOW (A) 11/17/2019 1626   APPEARANCEUR HAZY (A) 11/17/2019 1626   LABSPEC 1.011 11/17/2019 1626   PHURINE 5.0 11/17/2019 1626   GLUCOSEU NEGATIVE 11/17/2019 1626   HGBUR NEGATIVE 11/17/2019 1626   BILIRUBINUR NEGATIVE 11/17/2019 1626   BILIRUBINUR Negative 11/10/2019 1643   KETONESUR NEGATIVE 11/17/2019 1626   PROTEINUR NEGATIVE 11/17/2019 1626   UROBILINOGEN 0.2 11/10/2019 1643   NITRITE NEGATIVE 11/17/2019 1626   LEUKOCYTESUR NEGATIVE 11/17/2019 1626   Sepsis Labs Invalid input(s): PROCALCITONIN,  WBC,  LACTICIDVEN Microbiology Recent Results (from the past 240 hour(s))  Urine culture     Status: Abnormal   Collection Time: 11/10/19  4:44 PM   Specimen: Urine  Result Value Ref Range Status   MICRO NUMBER: [id number]  Final   SPECIMEN QUALITY: Adequate  Final   Sample Source URINE  Final   STATUS: FINAL  Final   ISOLATE 1: Enterococcus faecalis (A)  Final    Comment: Greater than 100,000 CFU/mL of Enterococcus faecalis      Susceptibility   Enterococcus faecalis - URINE CULTURE POSITIVE 1    AMPICILLIN <=2 Sensitive     VANCOMYCIN 1 Sensitive     NITROFURANTOIN* <=16 Sensitive      * Legend:S = Susceptible  I = IntermediateR = Resistant  NS = Not susceptible* = Not tested  NR = Not reported**NN = See antimicrobic comments  SARS CORONAVIRUS 2 (TAT 6-24 HRS) Nasopharyngeal Nasopharyngeal Swab     Status: None   Collection Time: 11/17/19  7:36 PM   Specimen: Nasopharyngeal Swab  Result Value Ref Range Status   SARS Coronavirus 2 NEGATIVE NEGATIVE Final    Comment: (NOTE) SARS-CoV-2 target nucleic  acids are NOT DETECTED. The SARS-CoV-2 RNA is generally detectable in upper and lower respiratory specimens during the acute phase of infection. Negative results do not preclude SARS-CoV-2 infection, do not rule out co-infections with other pathogens, and should not be used as the sole basis for treatment or other patient management decisions. Negative results must be combined with clinical observations, patient history, and epidemiological information. The expected result is Negative. Fact Sheet for Patients: SugarRoll.be Fact  Sheet for Healthcare Providers: https://www.woods-mathews.com/ This test is not yet approved or cleared by the [place] FDA and  has been authorized for detection and/or diagnosis of SARS-CoV-2 by FDA under an Emergency Use Authorization (EUA). This EUA will remain  in effect (meaning this test can be used) for the duration of the COVID-19 declaration under Section 56 4(b)(1) of the Act, 21 U.S.C. section 360bbb-3(b)(1), unless the authorization is terminated or revoked sooner. Performed at [facility], [place] [address] [place], [place] [address]      Time coordinating discharge: 25 minutes      SIGNED:   Edwin Dada, MD  [facility] 11/20/2019, 4:01 PM

## 2019-11-20 NOTE — Procedures (Signed)
Pre procedural Dx: Retroperitoneal mass Post procedural Dx: Same  Technically successful CT guided biopsy of left sided RP mass, presumed nodal conglomeration.    EBL: None.   Complications: None immediate.   Ronny Bacon, MD Pager #: [phone]

## 2019-11-20 NOTE — Progress Notes (Signed)
Patient discharged home with home health. All discharge instructions given to patient and granddaughter and all questions answered.

## 2019-11-21 LAB — PTT FACTOR INHIBITOR (MIXING STUDY)
aPTT 1:1 NP Incub. Mix Ctl: 29.6 s (ref 22.9–30.2)
aPTT 1:1 NP Mix, 60 Min,Incub.: 30.2 s (ref 22.9–30.2)
aPTT 1:1 Normal Plasma: 28 s (ref 22.9–30.2)
aPTT: 34.1 s — ABNORMAL HIGH (ref 22.9–30.2)

## 2019-11-23 ENCOUNTER — Other Ambulatory Visit: Payer: Self-pay | Admitting: Hospice and Palliative Medicine

## 2019-11-23 ENCOUNTER — Encounter: Payer: Self-pay | Admitting: Oncology

## 2019-11-23 ENCOUNTER — Telehealth: Payer: Self-pay

## 2019-11-23 DIAGNOSIS — Z515 Encounter for palliative care: Secondary | ICD-10-CM

## 2019-11-23 NOTE — Telephone Encounter (Signed)
Left message with female to have patient return my call- need to complete TCM.

## 2019-11-23 NOTE — Telephone Encounter (Signed)
Patient's granddaughter [phone] [name]) called stating patient is feeling very weak and tired, denies fever, chills, nausea, or pain. It was explained that this could be due to anesthesia and should get better over the next week, but to monitor her and if symptoms worsen to call back. Patient tolerating stent well

## 2019-11-23 NOTE — Telephone Encounter (Signed)
Does not need 2 week follow up, the April visit with Erlene Quan is appropriate  Thanks Nickolas Madrid, MD 11/23/2019

## 2019-11-23 NOTE — Telephone Encounter (Signed)
[name] notified.

## 2019-11-23 NOTE — Telephone Encounter (Signed)
Transition Care Management Follow-up Telephone Call  Date of discharge and from where: 11/20/2019, [facility]  How have you been since you were released from the hospital? [place] states that patient is not feeling well since her discharge. Patient is not eating much and seems very fatigue.  Any questions or concerns? Yes , Patient not feeling very well.   Items Reviewed:  Did the pt receive and understand the discharge instructions provided? Yes   Medications obtained and verified? Yes   Any new allergies since your discharge? No   Dietary orders reviewed? Yes  Do you have support at home? Yes   Functional Questionnaire: (I = Independent and D = Dependent) ADLs: D  Bathing/Dressing- D  Meal Prep- D  Eating- I  Maintaining continence- I  Transferring/Ambulation- D  Managing Meds- D  Follow up appointments reviewed:   PCP Hospital f/u appt confirmed? No  [name] is going to contact urology for an appointment ASAP. If she doesn't get what she needs there, she will call our office back to see about scheduling an appointment.   [facility] f/u appt confirmed? Yes    Are transportation arrangements needed? No   If their condition worsens, is the pt aware to call PCP or go to the Emergency Dept.? Yes  Was the patient provided with contact information for the PCP's office or ED? Yes  Was to pt encouraged to call back with questions or concerns? Yes

## 2019-11-23 NOTE — Progress Notes (Signed)
palli 

## 2019-11-24 ENCOUNTER — Other Ambulatory Visit: Payer: Self-pay | Admitting: *Deleted

## 2019-11-24 ENCOUNTER — Encounter: Payer: Self-pay | Admitting: Oncology

## 2019-11-24 NOTE — Patient Outreach (Signed)
[place] [facility]) Care Management  11/24/2019  Regina Harris 22-Apr-1936 [id number]    EMMI-GENERAL DISCHARGE RED ON EMMI ALERT Day #1 Date:11/22/2019 Red Alert Reason:No follow up appointment  Outreach #1 RN attempted outreach however caregiver [name] indicates pt was sleeping. Note Epic indicated a transition of care call was made to pt on 11/23/2019 by the provider's office.   Plan: Will follow up with another outreach later today and/or tomorrow and verify the above EMMI has been resolved.   Note pt is also enrolled for ongoing services with this RN case manager via [facility]. Will follow up accordingly with any related issues post d/c telephone assessment.   Provider's office to complete the transition of care.   Raina Mina, RN Care Management Coordinator [place] Office [phone]

## 2019-11-24 NOTE — Patient Outreach (Signed)
[place] [facility]) Care Management  11/24/2019  Regina Harris 09/29/1936 [id number]    Telephone Assessment   RN followed up once again as requested however pt continue to be sleeping as reported by her aide in the home [name]).   RN will rescheduled to follow up over the next week.  Raina Mina, RN Care Management Coordinator [place] Office [phone]

## 2019-11-24 NOTE — Progress Notes (Signed)
Patient prescreened for appointment. Patient has no concerns or questions.  

## 2019-11-25 ENCOUNTER — Other Ambulatory Visit: Payer: Self-pay | Admitting: Emergency Medicine

## 2019-11-25 ENCOUNTER — Telehealth: Payer: Self-pay

## 2019-11-25 ENCOUNTER — Inpatient Hospital Stay: Payer: Medicare Other | Attending: Oncology | Admitting: Oncology

## 2019-11-25 DIAGNOSIS — R634 Abnormal weight loss: Secondary | ICD-10-CM | POA: Insufficient documentation

## 2019-11-25 DIAGNOSIS — E860 Dehydration: Secondary | ICD-10-CM | POA: Insufficient documentation

## 2019-11-25 DIAGNOSIS — Z87891 Personal history of nicotine dependence: Secondary | ICD-10-CM | POA: Insufficient documentation

## 2019-11-25 DIAGNOSIS — R197 Diarrhea, unspecified: Secondary | ICD-10-CM | POA: Insufficient documentation

## 2019-11-25 DIAGNOSIS — J9859 Other diseases of mediastinum, not elsewhere classified: Secondary | ICD-10-CM | POA: Diagnosis not present

## 2019-11-25 DIAGNOSIS — R350 Frequency of micturition: Secondary | ICD-10-CM | POA: Insufficient documentation

## 2019-11-25 DIAGNOSIS — C480 Malignant neoplasm of retroperitoneum: Secondary | ICD-10-CM | POA: Insufficient documentation

## 2019-11-25 DIAGNOSIS — Z8051 Family history of malignant neoplasm of kidney: Secondary | ICD-10-CM | POA: Insufficient documentation

## 2019-11-25 DIAGNOSIS — C8592 Non-Hodgkin lymphoma, unspecified, intrathoracic lymph nodes: Secondary | ICD-10-CM

## 2019-11-25 DIAGNOSIS — R630 Anorexia: Secondary | ICD-10-CM | POA: Insufficient documentation

## 2019-11-25 NOTE — Progress Notes (Signed)
as

## 2019-11-25 NOTE — Telephone Encounter (Signed)
Telephone call to patients grandaughter [name] to schedule palliative care visit.  [name] in agreement with palliative care team making home visit on 11/30/19 at 2:30 PM

## 2019-11-26 ENCOUNTER — Telehealth: Payer: Self-pay | Admitting: Family Medicine

## 2019-11-26 ENCOUNTER — Other Ambulatory Visit: Payer: Self-pay

## 2019-11-26 ENCOUNTER — Encounter: Payer: Self-pay | Admitting: Oncology

## 2019-11-26 ENCOUNTER — Other Ambulatory Visit: Payer: Self-pay | Admitting: Emergency Medicine

## 2019-11-26 DIAGNOSIS — C8592 Non-Hodgkin lymphoma, unspecified, intrathoracic lymph nodes: Secondary | ICD-10-CM

## 2019-11-26 DIAGNOSIS — R634 Abnormal weight loss: Secondary | ICD-10-CM | POA: Diagnosis not present

## 2019-11-26 DIAGNOSIS — R350 Frequency of micturition: Secondary | ICD-10-CM

## 2019-11-26 DIAGNOSIS — I10 Essential (primary) hypertension: Secondary | ICD-10-CM | POA: Diagnosis not present

## 2019-11-26 DIAGNOSIS — Z9181 History of falling: Secondary | ICD-10-CM | POA: Diagnosis not present

## 2019-11-26 DIAGNOSIS — E039 Hypothyroidism, unspecified: Secondary | ICD-10-CM | POA: Diagnosis not present

## 2019-11-26 DIAGNOSIS — Z48816 Encounter for surgical aftercare following surgery on the genitourinary system: Secondary | ICD-10-CM | POA: Diagnosis not present

## 2019-11-26 DIAGNOSIS — R222 Localized swelling, mass and lump, trunk: Secondary | ICD-10-CM | POA: Diagnosis not present

## 2019-11-26 DIAGNOSIS — E860 Dehydration: Secondary | ICD-10-CM | POA: Diagnosis not present

## 2019-11-26 DIAGNOSIS — C480 Malignant neoplasm of retroperitoneum: Secondary | ICD-10-CM | POA: Diagnosis not present

## 2019-11-26 DIAGNOSIS — K859 Acute pancreatitis without necrosis or infection, unspecified: Secondary | ICD-10-CM | POA: Diagnosis not present

## 2019-11-26 DIAGNOSIS — R630 Anorexia: Secondary | ICD-10-CM | POA: Diagnosis not present

## 2019-11-26 DIAGNOSIS — E119 Type 2 diabetes mellitus without complications: Secondary | ICD-10-CM | POA: Diagnosis not present

## 2019-11-26 DIAGNOSIS — N179 Acute kidney failure, unspecified: Secondary | ICD-10-CM | POA: Diagnosis not present

## 2019-11-26 DIAGNOSIS — F0390 Unspecified dementia without behavioral disturbance: Secondary | ICD-10-CM | POA: Diagnosis not present

## 2019-11-26 DIAGNOSIS — Z7982 Long term (current) use of aspirin: Secondary | ICD-10-CM | POA: Diagnosis not present

## 2019-11-26 DIAGNOSIS — Z87440 Personal history of urinary (tract) infections: Secondary | ICD-10-CM | POA: Diagnosis not present

## 2019-11-26 DIAGNOSIS — K5792 Diverticulitis of intestine, part unspecified, without perforation or abscess without bleeding: Secondary | ICD-10-CM | POA: Diagnosis not present

## 2019-11-26 DIAGNOSIS — Z8619 Personal history of other infectious and parasitic diseases: Secondary | ICD-10-CM | POA: Diagnosis not present

## 2019-11-26 DIAGNOSIS — N131 Hydronephrosis with ureteral stricture, not elsewhere classified: Secondary | ICD-10-CM | POA: Diagnosis not present

## 2019-11-26 DIAGNOSIS — Z960 Presence of urogenital implants: Secondary | ICD-10-CM | POA: Diagnosis not present

## 2019-11-26 DIAGNOSIS — R197 Diarrhea, unspecified: Secondary | ICD-10-CM | POA: Diagnosis not present

## 2019-11-26 DIAGNOSIS — Z87891 Personal history of nicotine dependence: Secondary | ICD-10-CM | POA: Diagnosis not present

## 2019-11-26 DIAGNOSIS — Z8051 Family history of malignant neoplasm of kidney: Secondary | ICD-10-CM | POA: Diagnosis not present

## 2019-11-26 LAB — SURGICAL PATHOLOGY

## 2019-11-26 NOTE — Telephone Encounter (Signed)
Please ok those verbal orders  

## 2019-11-26 NOTE — Progress Notes (Signed)
[name]  [phone] Fax:([phone]) [phone]  ID: Regina Harris OB: Sep 22, 1936  MR#: [id number]  DVV#:[id number]  Patient Care Team: Abner Greenspan, MD as PCP - General Byrnett, Forest Gleason, MD (General Surgery) Tobi Bastos, RN as [place] Management  I connected with Regina Harris on 11/26/19 at  9:00 AM EST by video enabled telemedicine visit and verified that I am speaking with the correct person using two identifiers.   I discussed the limitations, risks, security and privacy concerns of performing an evaluation and management service by telemedicine and the availability of in-person appointments. I also discussed with the patient that there may be a patient responsible charge related to this service. The patient expressed understanding and agreed to proceed.   Other persons participating in the visit and their role in the encounter: Patient, MD.  Patient's location: Home. Provider's location: Clinic.  CHIEF COMPLAINT: Mediastinal mass, likely malignant.  INTERVAL HISTORY: Patient is an 84 year old female who was initially evaluated in the hospital after being admitted for abdominal pain.  She was incidentally noted to have a large mediastinal mass.  Biopsy is consistent with malignancy, but final pathology results are pending.  Patient's abdominal pain has resolved.  She continues to have increased weakness and fatigue and weight loss.  She feels uncomfortable, does not describe pain.  She has no neurologic complaints.  She denies any recent fevers.  She has a fair appetite.  She has no chest pain, shortness of breath, cough, or hemoptysis.  She denies any nausea, vomiting, constipation, or diarrhea.  She has no urinary complaints.  Patient otherwise feels well and offers no further specific complaints today.  REVIEW OF SYSTEMS:   Review of Systems  Constitutional: Positive for malaise/fatigue and weight loss. Negative for fever.    Respiratory: Negative.  Negative for cough, hemoptysis and shortness of breath.   Cardiovascular: Negative.  Negative for chest pain and leg swelling.  Gastrointestinal: Negative.  Negative for abdominal pain, blood in stool, melena and nausea.  Genitourinary: Negative.  Negative for dysuria.  Musculoskeletal: Negative.  Negative for back pain.  Skin: Negative.  Negative for rash.  Neurological: Positive for weakness. Negative for dizziness, focal weakness and headaches.  Psychiatric/Behavioral: Negative.  The patient is not nervous/anxious.     As per HPI. Otherwise, a complete review of systems is negative.  PAST MEDICAL HISTORY: Past Medical History:  Diagnosis Date  . Barrett esophagus   . Carotid bruit   . Diabetes mellitus without complication ([place])   . Esophagitis   . Gastritis 2013  . GERD (gastroesophageal reflux disease)   . HH (hiatus hernia)   . History of colonic polyps   . History of repair of right rotator cuff   . Hyperlipidemia   . Hypertension   . Hyperthyroidism 06/09/2018   Per [facility]I Therapy for Hyperthyroidism order  . Lump or mass in breast   . Pneumonia   . Polycythemia, secondary   . Tobacco abuse     PAST SURGICAL HISTORY: Past Surgical History:  Procedure Laterality Date  . ABDOMINAL HYSTERECTOMY    . APPENDECTOMY    . BREAST BIOPSY Right 1996  . BREAST BIOPSY Left 10/09/2012   Benign breast tissue with focal fat necrosis and focal periductal chronic inflammation.  Marland Kitchen BREAST BIOPSY Left 10/09/2012   Benign breast tissue with focal fat necrosis and focal periductal chronic inflammation.  Marland Kitchen CATARACT EXTRACTION  3/11   Dr Charise Killian  . CHOLECYSTECTOMY    .  COLONOSCOPY  2009  . CYSTOSCOPY WITH URETEROSCOPY AND STENT PLACEMENT Left 11/19/2019   Procedure: CYSTOSCOPY WITH URETEROSCOPY AND STENT PLACEMENT;  Surgeon: Billey Co, MD;  Location: [facility] ORS;  Service: Urology;  Laterality: Left;  . ESOPHAGOGASTRODUODENOSCOPY (EGD) WITH PROPOFOL N/A  11/02/2015   Procedure: ESOPHAGOGASTRODUODENOSCOPY (EGD) WITH PROPOFOL;  Surgeon: Manya Silvas, MD;  Location: [facility] ENDOSCOPY;  Service: Endoscopy;  Laterality: N/A;  . SHOULDER ARTHROSCOPY WITH OPEN ROTATOR CUFF REPAIR Right 03/05/2016   Procedure: SHOULDER ARTHROSCOPY WITH OPEN ROTATOR CUFF REPAIR;  Surgeon: Earnestine Leys, MD;  Location: [facility] ORS;  Service: Orthopedics;  Laterality: Right;  . UPPER GI ENDOSCOPY  2013    FAMILY HISTORY: Family History  Problem Relation Age of Onset  . Kidney cancer Father   . Diabetes Mother   . Breast cancer Neg Hx     ADVANCED DIRECTIVES (Y/N):  N  HEALTH MAINTENANCE: Social History   Tobacco Use  . Smoking status: Former Smoker    Packs/day: 1.00    Types: Cigarettes  . Smokeless tobacco: Never Used  Substance Use Topics  . Alcohol use: No    Alcohol/week: 0.0 standard drinks  . Drug use: No     Colonoscopy:  PAP:  Bone density:  Lipid panel:  Allergies  Allergen Reactions  . Benadryl [Diphenhydramine] Other (See Comments)    Hallucinations; family request to not give this medication.    Current Outpatient Medications  Medication Sig Dispense Refill  . acetaminophen (TYLENOL) 500 MG tablet Take 2 tablets (1,000 mg total) by mouth 3 (three) times daily. 30 tablet 0  . aspirin EC 81 MG tablet Take 81 mg by mouth daily.    Marland Kitchen atorvastatin (LIPITOR) 10 MG tablet TAKE 1 TABLET BY MOUTH ONCE DAILY (Patient taking differently: Take 10 mg by mouth daily. ) 90 tablet 1  . Calcium Carb-Cholecalciferol (CALCIUM 1000 + D) 1000-800 MG-UNIT TABS Take 1 tablet by mouth 2 (two) times daily.     . cyanocobalamin 1000 MCG tablet Take 1,000 mcg by mouth daily.    Marland Kitchen donepezil (ARICEPT) 5 MG tablet Take 1 tablet (5 mg total) by mouth at bedtime. 30 tablet 11  . furosemide (LASIX) 20 MG tablet TAKE 1 TABLET BY MOUTH ONCE A DAY (Patient taking differently: Take 20 mg by mouth daily. ) 90 tablet 1  . gabapentin (NEURONTIN) 400 MG capsule TAKE 1  CAPSULE BY MOUTH TWICE DAILY (Patient taking differently: Take 400 mg by mouth 2 (two) times daily. ) 180 capsule 1  . levothyroxine (SYNTHROID) 75 MCG tablet Take 75 mcg by mouth daily with breakfast.     . omeprazole (PRILOSEC) 20 MG capsule TAKE 1 CAPSULE BY MOUTH TWICE DAILY (Patient taking differently: Take 20 mg by mouth 2 (two) times daily. ) 180 capsule 1  . potassium chloride SA (K-DUR) 20 MEQ tablet TAKE 1 TABLET BY MOUTH TWICE (2) DAILY (Patient taking differently: Take 20 mEq by mouth 2 (two) times daily. ) 180 tablet 1  . zinc sulfate 220 (50 Zn) MG capsule Take 1 capsule (220 mg total) by mouth daily.     No current facility-administered medications for this visit.    OBJECTIVE: There were no vitals filed for this visit.   There is no height or weight on file to calculate BMI.    ECOG FS:2 - Symptomatic, <50% confined to bed  General: Well-developed, well-nourished, no acute distress. HEENT: Normocephalic. Neuro: Alert, answering all questions appropriately. Cranial nerves grossly intact. Psych: Normal affect.  LAB RESULTS:  Lab Results  Component Value Date   NA 140 11/20/2019   K 4.0 11/20/2019   CL 111 11/20/2019   CO2 22 11/20/2019   GLUCOSE 122 (H) 11/20/2019   BUN 19 11/20/2019   CREATININE 1.03 (H) 11/20/2019   CALCIUM 8.7 (L) 11/20/2019   PROT 6.6 11/19/2019   ALBUMIN 2.9 (L) 11/19/2019   AST 10 (L) 11/19/2019   ALT 6 11/19/2019   ALKPHOS 53 11/19/2019   BILITOT 0.8 11/19/2019   GFRNONAA 51 (L) 11/20/2019   GFRAA 59 (L) 11/20/2019    Lab Results  Component Value Date   WBC 9.7 11/20/2019   NEUTROABS 4.3 09/14/2019   HGB 14.0 11/20/2019   HCT 42.3 11/20/2019   MCV 94.6 11/20/2019   PLT 147 (L) 11/20/2019     STUDIES: CT ABDOMEN PELVIS WO CONTRAST  Result Date: 11/17/2019 CLINICAL DATA:  84 year old female with left-sided abdominal pain. Concern for acute diverticulitis. EXAM: CT ABDOMEN AND PELVIS WITHOUT CONTRAST TECHNIQUE: Multidetector CT  imaging of the abdomen and pelvis was performed following the standard protocol without IV contrast. COMPARISON:  CT abdomen pelvis dated 06/13/2006. FINDINGS: Evaluation of this exam is limited in the absence of intravenous contrast. Lower chest: Trace left pleural effusion. There is thickened appearance of the posteromedial lower lobe pleural surfaces. Partially visualized infiltrative mass encasing the distal descending thoracic aorta and abutting the lower thoracic spine at T10-T11. This may represent an infiltrative neoplasm, lymphoma, or fibrosis versus sequela osteomyelitis/discitis. This is new since the CT of 07/17/2010. Further evaluation with MRI without and with contrast is recommended. Bibasilar subpleural linear and streaky densities may represent atelectasis/scarring. Infiltrate is less likely. Clinical correlation is recommended. There is no intra-abdominal free air or free fluid. Hepatobiliary: The liver is unremarkable. No intrahepatic biliary ductal dilatation. Cholecystectomy. No retained calcified stone noted in the central CBD. Pancreas: There is mild haziness of the distal pancreas which may represent acute pancreatitis. Correlation with pancreatic enzymes recommended. No drainable fluid collection/abscess or pseudocyst Spleen: Normal in size without focal abnormality. Adrenals/Urinary Tract: There is a 2 cm left adrenal adenoma. The right adrenal gland is unremarkable. There is mild left hydronephrosis. No calcified stone identified. There is haziness and stranding of the proximal left periureteric fat which may represent an infection/inflammatory process or fibrosis and possible degree of ureteral stricture. There is a 2 cm left renal inferior pole hypodense lesion which is not characterized but demonstrates fluid attenuation most consistent with a cyst. There is no hydronephrosis or nephrolithiasis on the right. Multiple right renal cysts measure up to approximately 7 cm. The right ureter  and urinary bladder appear unremarkable. Stomach/Bowel: There is sigmoid diverticulosis with muscular hypertrophy. Mild perisigmoid stranding and haziness may represent mild acute diverticulitis. Clinical correlation is recommended. There is diffuse thickened appearance of the colon which may be related to underdistention. Diffuse submucosal fat deposit along the colonic wall likely sequela of chronic inflammatory process. There is no bowel obstruction. Appendectomy. Vascular/Lymphatic: Advanced aortoiliac atherosclerotic disease. There is stranding of the periaortic fat. There is a 3.8 x 2.1 cm left para-aortic/paraspinal soft tissue lesion which is not characterized but may represent enlarged lymph node (series 2 image 38). However, this soft tissue appears to abut the left L4 vertebra and therefore a neoplasm or an infectious process is not excluded. No portal venous gas. The IVC is unremarkable. Reproductive: Hysterectomy. Other: None Musculoskeletal: Osteopenia with extensive multilevel degenerative changes, disc desiccation and vacuum phenomena. Old-appearing multilevel compression fractures most prominent  involving L3 and L4. No definite acute fracture. No retropulsed fragment. IMPRESSION: 1. Sigmoid diverticulosis with findings concerning for mild diverticulitis. No diverticular abscess or perforation. 2. No bowel obstruction. 3. Mild left hydronephrosis, possibly secondary to a degree of stricture or decreased peristalsis of the left ureter due to adjacent inflammatory process or fibrosis. No stone. 4. Mild stranding of the distal pancreas. Correlation with pancreatic enzymes recommended to evaluate for acute pancreatitis. No fluid collection. 5. Infiltrative soft tissue mass along the lower thoracic spine at T10-T11 with encasement of the aorta. This may represent an inflammatory/infectious process (osteomyelitis/discitis) or an infiltrative neoplasm. Further evaluation with MRI without and with contrast  is recommended. 6. Thickened appearance of the inferior medial lower lobe pleural surfaces which may be secondary to infiltration of paraspinal/periaortic mass or sequela of chronic inflammation. 7. Left para-aortic soft tissue density to the left of L4 may represent adenopathy or a neoplasm. Electronically Signed   By: Anner Crete M.D.   On: 11/17/2019 18:09   CT CHEST WO CONTRAST  Result Date: 11/18/2019 CLINICAL DATA:  Adenopathy EXAM: CT CHEST WITHOUT CONTRAST TECHNIQUE: Multidetector CT imaging of the chest was performed following the standard protocol without IV contrast. COMPARISON:  Chest CT 07/17/2010 FINDINGS: Cardiovascular: Normal heart size. No pericardial effusion. Aortic and coronary atherosclerosis. Mediastinum/Nodes: Infiltrative posterior mediastinal mass partially encasing the descending aorta with indistinguishable plane between the mass and the posterior wall of the aorta separate from areas of intimal calcification. The infiltrative abnormality extends approximately 9 cm craniocaudal and 5.6 cm in diameter. The hila and other mediastinal spaces are negative for adenopathy. Lungs/Pleura: Small pleural effusions on both sides. There is subpleural extension of the mass with no visible nodularity along the pleural surface, although limited without contrast. Dependent atelectasis. No worrisome pulmonary nodules. Upper Abdomen: Partial coverage shows left hydronephrosis likely due to the retroperitoneal mass medial to the left psoas on prior CT. Right renal cystic change and atherosclerotic calcification. Cholecystectomy. Musculoskeletal: The posterior mediastinal mass is in close proximity to the spine but does not cause visible erosion or infiltrative lucency. No gross spinal canal mass. No evidence of hematogenous osseous metastases. IMPRESSION: 1. Approximately 9 x 6 cm infiltrative posterior mediastinal mass intimately associated with the descending aorta and thoracic spine. Lymphoma is  favored. 2. Known left hydronephrosis related to infiltrative mass in the retroperitoneum. 3. Small pleural effusions with lower lobe atelectasis. Electronically Signed   By: Monte Fantasia M.D.   On: 11/18/2019 11:27   CT GUIDED NEEDLE PLACEMENT  Result Date: 11/20/2019 INDICATION: No known primary, now with retroperitoneal mass worrisome for lymphoma. Please perform CT-guided biopsy for tissue diagnostic purposes. EXAM: CT GUIDANCE NEEDLE PLACEMENT COMPARISON:  CT abdomen and pelvis-09/16/2020 MEDICATIONS: None. ANESTHESIA/SEDATION: Fentanyl 50 mcg IV; Versed 1 mg IV Sedation time: 16 minutes; The patient was continuously monitored during the procedure by the interventional radiology nurse under my direct supervision. CONTRAST:  None. COMPLICATIONS: None immediate. PROCEDURE: Informed consent was obtained from the patient following an explanation of the procedure, risks, benefits and alternatives. A time out was performed prior to the initiation of the procedure. The patient was positioned prone on the CT table and a limited CT was performed for procedural planning demonstrating unchanged size and appearance the left-sided retroperitoneal mass, presumed nodal conglomeration with dominant component measuring approximately 4.5 x 2.6 cm (image 21, series 3). The procedure was planned. The operative site was prepped and draped in the usual sterile fashion. Appropriate trajectory was confirmed with a 22  gauge spinal needle after the adjacent tissues were anesthetized with 1% Lidocaine with epinephrine. Under intermittent CT guidance, a 17 gauge coaxial needle was advanced into the peripheral aspect of the mass. Appropriate positioning was confirmed and 6 core needle biopsy samples were obtained with an 18 gauge core needle biopsy device. The co-axial needle was removed following the administration of a Gel-Foam slurry and superficial hemostasis was achieved with manual compression. A limited postprocedural CT was  negative for hemorrhage or additional complication. A dressing was placed. The patient tolerated the procedure well without immediate postprocedural complication. IMPRESSION: Technically successful CT guided core needle biopsy of left-sided retroperitoneal mass, presumed nodal conglomeration. Electronically Signed   By: Sandi Mariscal M.D.   On: 11/20/2019 09:43   DG C-Arm 1-60 Min-No Report  Result Date: 11/19/2019 Fluoroscopy was utilized by the requesting physician.  No radiographic interpretation.    ASSESSMENT: Mediastinal mass, likely malignant.  PLAN:    1. Mediastinal mass, likely malignant: Biopsy consistent with malignancy, but final path is still pending at time of dictation.  Patient will require PET scan as well as an MRI of the brain to complete the staging work-up.  Patient will have another video assisted telemedicine visit 1 to 2 days after she completes her imaging to discuss the results as well as treatment planning. 2.  Poor appetite/weight loss: Patient was given a referral to dietary.  I provided 30 minutes of face-to-face video visit time during this encounter which included chart review, counseling, and coordination of care as documented above.   Patient expressed understanding and was in agreement with this plan. She also understands that She can call clinic at any time with any questions, concerns, or complaints.   Cancer Staging No matching staging information was found for the patient.  Lloyd Huger, MD   11/26/2019 6:25 AM

## 2019-11-26 NOTE — Anesthesia Postprocedure Evaluation (Signed)
Anesthesia Post Note  Patient: ANABELL SWINT  Procedure(s) Performed: CYSTOSCOPY WITH URETEROSCOPY AND STENT PLACEMENT (Left )  Patient location during evaluation: PACU Anesthesia Type: General Level of consciousness: awake and alert Pain management: pain level controlled Vital Signs Assessment: post-procedure vital signs reviewed and stable Respiratory status: spontaneous breathing, nonlabored ventilation and respiratory function stable Cardiovascular status: blood pressure returned to baseline and stable Postop Assessment: no apparent nausea or vomiting Anesthetic complications: no     Last Vitals:  Vitals:   11/20/19 1101 11/20/19 1511  BP:  (!) 117/58  Pulse:  70  Resp:  17  Temp:  (!) 36.4 C  SpO2: 95% 96%    Last Pain:  Vitals:   11/20/19 1511  TempSrc: Oral  PainSc:                  Alphonsus Sias

## 2019-11-26 NOTE — Telephone Encounter (Signed)
Received a call from [place] at [facility] [phone] Requesting a call back with verbal orders for continued PT 2 x weekly x 4 weeks 1 x weekly x 4 weeks  Please advise, thanks.

## 2019-11-27 ENCOUNTER — Inpatient Hospital Stay (HOSPITAL_BASED_OUTPATIENT_CLINIC_OR_DEPARTMENT_OTHER): Payer: Medicare Other | Admitting: Oncology

## 2019-11-27 ENCOUNTER — Other Ambulatory Visit: Payer: Self-pay

## 2019-11-27 ENCOUNTER — Other Ambulatory Visit: Payer: Medicare Other

## 2019-11-27 ENCOUNTER — Other Ambulatory Visit: Payer: Self-pay | Admitting: Family Medicine

## 2019-11-27 ENCOUNTER — Inpatient Hospital Stay: Payer: Medicare Other

## 2019-11-27 ENCOUNTER — Encounter: Payer: Self-pay | Admitting: Oncology

## 2019-11-27 VITALS — BP 93/42 | HR 71 | Resp 17 | Wt 200.0 lb

## 2019-11-27 DIAGNOSIS — R350 Frequency of micturition: Secondary | ICD-10-CM

## 2019-11-27 DIAGNOSIS — J9859 Other diseases of mediastinum, not elsewhere classified: Secondary | ICD-10-CM

## 2019-11-27 DIAGNOSIS — C8592 Non-Hodgkin lymphoma, unspecified, intrathoracic lymph nodes: Secondary | ICD-10-CM

## 2019-11-27 DIAGNOSIS — R197 Diarrhea, unspecified: Secondary | ICD-10-CM | POA: Diagnosis not present

## 2019-11-27 DIAGNOSIS — C480 Malignant neoplasm of retroperitoneum: Secondary | ICD-10-CM | POA: Diagnosis not present

## 2019-11-27 DIAGNOSIS — R634 Abnormal weight loss: Secondary | ICD-10-CM | POA: Diagnosis not present

## 2019-11-27 DIAGNOSIS — E860 Dehydration: Secondary | ICD-10-CM | POA: Diagnosis not present

## 2019-11-27 DIAGNOSIS — R630 Anorexia: Secondary | ICD-10-CM | POA: Diagnosis not present

## 2019-11-27 LAB — CBC WITH DIFFERENTIAL/PLATELET
Abs Immature Granulocytes: 0.06 10*3/uL (ref 0.00–0.07)
Basophils Absolute: 0.1 10*3/uL (ref 0.0–0.1)
Basophils Relative: 1 %
Eosinophils Absolute: 0.1 10*3/uL (ref 0.0–0.5)
Eosinophils Relative: 3 %
HCT: 44.2 % (ref 36.0–46.0)
Hemoglobin: 13.6 g/dL (ref 12.0–15.0)
Immature Granulocytes: 1 %
Lymphocytes Relative: 18 %
Lymphs Abs: 0.9 10*3/uL (ref 0.7–4.0)
MCH: 30.5 pg (ref 26.0–34.0)
MCHC: 30.8 g/dL (ref 30.0–36.0)
MCV: 99.1 fL (ref 80.0–100.0)
Monocytes Absolute: 0.6 10*3/uL (ref 0.1–1.0)
Monocytes Relative: 12 %
Neutro Abs: 3.2 10*3/uL (ref 1.7–7.7)
Neutrophils Relative %: 65 %
Platelets: 187 10*3/uL (ref 150–400)
RBC: 4.46 MIL/uL (ref 3.87–5.11)
RDW: 11.7 % (ref 11.5–15.5)
WBC: 5 10*3/uL (ref 4.0–10.5)
nRBC: 0 % (ref 0.0–0.2)

## 2019-11-27 LAB — COMPREHENSIVE METABOLIC PANEL
ALT: 8 U/L (ref 0–44)
AST: 11 U/L — ABNORMAL LOW (ref 15–41)
Albumin: 2.8 g/dL — ABNORMAL LOW (ref 3.5–5.0)
Alkaline Phosphatase: 54 U/L (ref 38–126)
Anion gap: 9 (ref 5–15)
BUN: 14 mg/dL (ref 8–23)
CO2: 26 mmol/L (ref 22–32)
Calcium: 8.7 mg/dL — ABNORMAL LOW (ref 8.9–10.3)
Chloride: 107 mmol/L (ref 98–111)
Creatinine, Ser: 1.14 mg/dL — ABNORMAL HIGH (ref 0.44–1.00)
GFR calc Af Amer: 52 mL/min — ABNORMAL LOW (ref >60)
GFR calc non Af Amer: 45 mL/min — ABNORMAL LOW (ref >60)
Glucose, Bld: 162 mg/dL — ABNORMAL HIGH (ref 70–99)
Potassium: 3.9 mmol/L (ref 3.5–5.1)
Sodium: 142 mmol/L (ref 135–145)
Total Bilirubin: 0.5 mg/dL (ref 0.3–1.2)
Total Protein: 6.9 g/dL (ref 6.5–8.1)

## 2019-11-27 LAB — URINALYSIS, COMPLETE (UACMP) WITH MICROSCOPIC
Bilirubin Urine: NEGATIVE
Glucose, UA: NEGATIVE mg/dL
Ketones, ur: NEGATIVE mg/dL
Nitrite: POSITIVE — AB
Protein, ur: 30 mg/dL — AB
Specific Gravity, Urine: 1.016 (ref 1.005–1.030)
WBC, UA: >50 WBC/hpf — ABNORMAL HIGH (ref 0–5)
pH: 5 (ref 5.0–8.0)

## 2019-11-27 MED ORDER — SODIUM CHLORIDE 0.9 % IV SOLN
Freq: Once | INTRAVENOUS | Status: AC
  Filled 2019-11-27: qty 250

## 2019-11-27 MED ORDER — SODIUM CHLORIDE 0.9 % IV SOLN
10.0000 mg | Freq: Once | INTRAVENOUS | Status: DC
  Filled 2019-11-27: qty 1

## 2019-11-27 MED ORDER — MEGESTROL ACETATE 40 MG PO TABS: 40.0000 mg | ORAL_TABLET | Freq: Every day | ORAL | 1 refills | Status: AC

## 2019-11-27 MED ORDER — DEXAMETHASONE SODIUM PHOSPHATE 10 MG/ML IJ SOLN
10.0000 mg | Freq: Once | INTRAMUSCULAR | Status: AC
  Administered 2019-11-27: 10 mg via INTRAVENOUS
  Filled 2019-11-27: qty 1

## 2019-11-27 NOTE — Progress Notes (Signed)
Pt here for follow up with granddaughter. Pt reports not feeling well. Granddaughter reports diarrhea 2-3x/day as well as incontinence of stool in depends. Pt has been sleeping a lot and granddaughter reports that she has been sleeping all but an hour a day. Granddaughter reports that she has been complaining of left inguinal pain radiating down towards left knee, and has complained of pain under left breast, but pt currently does not endorse any pain. Granddaughter reports very poor appetite, pt denies nausea.

## 2019-11-27 NOTE — Telephone Encounter (Signed)
Left VM giving verbal orders  

## 2019-11-27 NOTE — Progress Notes (Signed)
[place]  [phone] [phone]  ID: [name] OB: 01-04-1936  MR#: [id number]  AOZ#:[id number]  Patient Care Team: Abner Greenspan, MD as PCP - General Byrnett, Forest Gleason, MD (General Surgery) Tobi Bastos, RN as [facility]   CHIEF COMPLAINT: Malignant retroperitoneal mass.  INTERVAL HISTORY: Patient returns to clinic today as an urgent add-on with a poor appetite, increasing weakness and fatigue, and a declining performance status.  She also has increased urinary frequency and diarrhea.  Patient's abdominal pain has resolved.  She has intermittent flank and left groin pain. She denies any recent fevers. She has no chest pain, shortness of breath, cough, or hemoptysis.  She denies any nausea, vomiting, or constipation.  Patient feels generally terrible, but offers no further specific complaints today.  REVIEW OF SYSTEMS:   Review of Systems  Constitutional: Positive for malaise/fatigue and weight loss. Negative for fever.  Respiratory: Negative.  Negative for cough, hemoptysis and shortness of breath.   Cardiovascular: Negative.  Negative for chest pain and leg swelling.  Gastrointestinal: Positive for diarrhea. Negative for abdominal pain, blood in stool, melena and nausea.  Genitourinary: Positive for flank pain. Negative for dysuria.  Musculoskeletal: Negative for back pain.  Skin: Negative.  Negative for rash.  Neurological: Positive for weakness. Negative for dizziness, focal weakness and headaches.  Psychiatric/Behavioral: Negative.  The patient is not nervous/anxious.     As per HPI. Otherwise, a complete review of systems is negative.  PAST MEDICAL HISTORY: Past Medical History:  Diagnosis Date  . Barrett esophagus   . Carotid bruit   . Diabetes mellitus without complication ([place])   . Esophagitis   . Gastritis 2013  . GERD (gastroesophageal reflux disease)   . HH (hiatus hernia)   .  History of colonic polyps   . History of repair of right rotator cuff   . Hyperlipidemia   . Hypertension   . Hyperthyroidism 06/09/2018   Per [facility] RAI Therapy for Hyperthyroidism order  . Lump or mass in breast   . Pneumonia   . Polycythemia, secondary   . Tobacco abuse     PAST SURGICAL HISTORY: Past Surgical History:  Procedure Laterality Date  . ABDOMINAL HYSTERECTOMY    . APPENDECTOMY    . BREAST BIOPSY Right 1996  . BREAST BIOPSY Left 10/09/2012   Benign breast tissue with focal fat necrosis and focal periductal chronic inflammation.  Marland Kitchen BREAST BIOPSY Left 10/09/2012   Benign breast tissue with focal fat necrosis and focal periductal chronic inflammation.  Marland Kitchen CATARACT EXTRACTION  3/11   Dr Charise Killian  . CHOLECYSTECTOMY    . COLONOSCOPY  2009  . CYSTOSCOPY WITH URETEROSCOPY AND STENT PLACEMENT Left 11/19/2019   Procedure: CYSTOSCOPY WITH URETEROSCOPY AND STENT PLACEMENT;  Surgeon: Billey Co, MD;  Location: [facility] ORS;  Service: Urology;  Laterality: Left;  . ESOPHAGOGASTRODUODENOSCOPY (EGD) WITH PROPOFOL N/A 11/02/2015   Procedure: ESOPHAGOGASTRODUODENOSCOPY (EGD) WITH PROPOFOL;  Surgeon: Manya Silvas, MD;  Location: [facility] ENDOSCOPY;  Service: Endoscopy;  Laterality: N/A;  . SHOULDER ARTHROSCOPY WITH OPEN ROTATOR CUFF REPAIR Right 03/05/2016   Procedure: SHOULDER ARTHROSCOPY WITH OPEN ROTATOR CUFF REPAIR;  Surgeon: Earnestine Leys, MD;  Location: [facility] ORS;  Service: Orthopedics;  Laterality: Right;  . UPPER GI ENDOSCOPY  2013    FAMILY HISTORY: Family History  Problem Relation Age of Onset  . Kidney cancer Father   . Diabetes Mother   . Breast cancer Neg Hx  ADVANCED DIRECTIVES (Y/N):  N  HEALTH MAINTENANCE: Social History   Tobacco Use  . Smoking status: Former Smoker    Packs/day: 1.00    Types: Cigarettes  . Smokeless tobacco: Never Used  Substance Use Topics  . Alcohol use: No    Alcohol/week: 0.0 standard drinks  . Drug use: No      Colonoscopy:  PAP:  Bone density:  Lipid panel:  Allergies  Allergen Reactions  . Benadryl [Diphenhydramine] Other (See Comments)    Hallucinations; family request to not give this medication.    Current Outpatient Medications  Medication Sig Dispense Refill  . acetaminophen (TYLENOL) 500 MG tablet Take 2 tablets (1,000 mg total) by mouth 3 (three) times daily. 30 tablet 0  . aspirin EC 81 MG tablet Take 81 mg by mouth daily.    Marland Kitchen atorvastatin (LIPITOR) 10 MG tablet TAKE 1 TABLET BY MOUTH ONCE DAILY (Patient taking differently: Take 10 mg by mouth daily. ) 90 tablet 1  . Calcium Carb-Cholecalciferol (CALCIUM 1000 + D) 1000-800 MG-UNIT TABS Take 1 tablet by mouth 2 (two) times daily.     . cyanocobalamin 1000 MCG tablet Take 1,000 mcg by mouth daily.    Marland Kitchen donepezil (ARICEPT) 5 MG tablet Take 1 tablet (5 mg total) by mouth at bedtime. 30 tablet 11  . furosemide (LASIX) 20 MG tablet TAKE 1 TABLET BY MOUTH ONCE A DAY (Patient taking differently: Take 20 mg by mouth daily. ) 90 tablet 1  . gabapentin (NEURONTIN) 400 MG capsule TAKE 1 CAPSULE BY MOUTH TWICE DAILY (Patient taking differently: Take 400 mg by mouth 2 (two) times daily. ) 180 capsule 1  . levothyroxine (SYNTHROID) 75 MCG tablet Take 75 mcg by mouth daily with breakfast.     . megestrol (MEGACE) 40 MG tablet Take 1 tablet (40 mg total) by mouth daily. 30 tablet 1  . omeprazole (PRILOSEC) 20 MG capsule TAKE 1 CAPSULE BY MOUTH TWICE DAILY (Patient taking differently: Take 20 mg by mouth 2 (two) times daily. ) 180 capsule 1  . potassium chloride SA (K-DUR) 20 MEQ tablet TAKE 1 TABLET BY MOUTH TWICE (2) DAILY (Patient taking differently: Take 20 mEq by mouth 2 (two) times daily. ) 180 tablet 1  . zinc sulfate 220 (50 Zn) MG capsule Take 1 capsule (220 mg total) by mouth daily.     No current facility-administered medications for this visit.    OBJECTIVE: Vitals:   11/27/19 1103  BP: (!) 93/42  Pulse: 71  Resp: 17  SpO2:  99%     Body mass index is 31.32 kg/m.    ECOG FS:2 - Symptomatic, <50% confined to bed  General: Ill-appearing, no acute distress. Eyes: Pink conjunctiva, anicteric sclera. HEENT: Normocephalic, moist mucous membranes. Lungs: No audible wheezing or coughing. Heart: Regular rate and rhythm. Abdomen: Soft, nontender, no obvious distention. Musculoskeletal: No edema, cyanosis, or clubbing. Neuro: Alert, answering all questions appropriately. Cranial nerves grossly intact. Skin: No rashes or petechiae noted. Psych: Normal affect.  LAB RESULTS:  Lab Results  Component Value Date   NA 142 11/27/2019   K 3.9 11/27/2019   CL 107 11/27/2019   CO2 26 11/27/2019   GLUCOSE 162 (H) 11/27/2019   BUN 14 11/27/2019   CREATININE 1.14 (H) 11/27/2019   CALCIUM 8.7 (L) 11/27/2019   PROT 6.9 11/27/2019   ALBUMIN 2.8 (L) 11/27/2019   AST 11 (L) 11/27/2019   ALT 8 11/27/2019   ALKPHOS 54 11/27/2019   BILITOT 0.5 11/27/2019  GFRNONAA 45 (L) 11/27/2019   GFRAA 52 (L) 11/27/2019    Lab Results  Component Value Date   WBC 5.0 11/27/2019   NEUTROABS 3.2 11/27/2019   HGB 13.6 11/27/2019   HCT 44.2 11/27/2019   MCV 99.1 11/27/2019   PLT 187 11/27/2019     STUDIES: CT ABDOMEN PELVIS WO CONTRAST  Result Date: 11/17/2019 CLINICAL DATA:  84 year old female with left-sided abdominal pain. Concern for acute diverticulitis. EXAM: CT ABDOMEN AND PELVIS WITHOUT CONTRAST TECHNIQUE: Multidetector CT imaging of the abdomen and pelvis was performed following the standard protocol without IV contrast. COMPARISON:  CT abdomen pelvis dated 06/13/2006. FINDINGS: Evaluation of this exam is limited in the absence of intravenous contrast. Lower chest: Trace left pleural effusion. There is thickened appearance of the posteromedial lower lobe pleural surfaces. Partially visualized infiltrative mass encasing the distal descending thoracic aorta and abutting the lower thoracic spine at T10-T11. This may represent an  infiltrative neoplasm, lymphoma, or fibrosis versus sequela osteomyelitis/discitis. This is new since the CT of 07/17/2010. Further evaluation with MRI without and with contrast is recommended. Bibasilar subpleural linear and streaky densities may represent atelectasis/scarring. Infiltrate is less likely. Clinical correlation is recommended. There is no intra-abdominal free air or free fluid. Hepatobiliary: The liver is unremarkable. No intrahepatic biliary ductal dilatation. Cholecystectomy. No retained calcified stone noted in the central CBD. Pancreas: There is mild haziness of the distal pancreas which may represent acute pancreatitis. Correlation with pancreatic enzymes recommended. No drainable fluid collection/abscess or pseudocyst Spleen: Normal in size without focal abnormality. Adrenals/Urinary Tract: There is a 2 cm left adrenal adenoma. The right adrenal gland is unremarkable. There is mild left hydronephrosis. No calcified stone identified. There is haziness and stranding of the proximal left periureteric fat which may represent an infection/inflammatory process or fibrosis and possible degree of ureteral stricture. There is a 2 cm left renal inferior pole hypodense lesion which is not characterized but demonstrates fluid attenuation most consistent with a cyst. There is no hydronephrosis or nephrolithiasis on the right. Multiple right renal cysts measure up to approximately 7 cm. The right ureter and urinary bladder appear unremarkable. Stomach/Bowel: There is sigmoid diverticulosis with muscular hypertrophy. Mild perisigmoid stranding and haziness may represent mild acute diverticulitis. Clinical correlation is recommended. There is diffuse thickened appearance of the colon which may be related to underdistention. Diffuse submucosal fat deposit along the colonic wall likely sequela of chronic inflammatory process. There is no bowel obstruction. Appendectomy. Vascular/Lymphatic: Advanced aortoiliac  atherosclerotic disease. There is stranding of the periaortic fat. There is a 3.8 x 2.1 cm left para-aortic/paraspinal soft tissue lesion which is not characterized but may represent enlarged lymph node (series 2 image 38). However, this soft tissue appears to abut the left L4 vertebra and therefore a neoplasm or an infectious process is not excluded. No portal venous gas. The IVC is unremarkable. Reproductive: Hysterectomy. Other: None Musculoskeletal: Osteopenia with extensive multilevel degenerative changes, disc desiccation and vacuum phenomena. Old-appearing multilevel compression fractures most prominent involving L3 and L4. No definite acute fracture. No retropulsed fragment. IMPRESSION: 1. Sigmoid diverticulosis with findings concerning for mild diverticulitis. No diverticular abscess or perforation. 2. No bowel obstruction. 3. Mild left hydronephrosis, possibly secondary to a degree of stricture or decreased peristalsis of the left ureter due to adjacent inflammatory process or fibrosis. No stone. 4. Mild stranding of the distal pancreas. Correlation with pancreatic enzymes recommended to evaluate for acute pancreatitis. No fluid collection. 5. Infiltrative soft tissue mass along the lower thoracic spine  at T10-T11 with encasement of the aorta. This may represent an inflammatory/infectious process (osteomyelitis/discitis) or an infiltrative neoplasm. Further evaluation with MRI without and with contrast is recommended. 6. Thickened appearance of the inferior medial lower lobe pleural surfaces which may be secondary to infiltration of paraspinal/periaortic mass or sequela of chronic inflammation. 7. Left para-aortic soft tissue density to the left of L4 may represent adenopathy or a neoplasm. Electronically Signed   By: Anner Crete M.D.   On: 11/17/2019 18:09   CT CHEST WO CONTRAST  Result Date: 11/18/2019 CLINICAL DATA:  Adenopathy EXAM: CT CHEST WITHOUT CONTRAST TECHNIQUE: Multidetector CT imaging  of the chest was performed following the standard protocol without IV contrast. COMPARISON:  Chest CT 07/17/2010 FINDINGS: Cardiovascular: Normal heart size. No pericardial effusion. Aortic and coronary atherosclerosis. Mediastinum/Nodes: Infiltrative posterior mediastinal mass partially encasing the descending aorta with indistinguishable plane between the mass and the posterior wall of the aorta separate from areas of intimal calcification. The infiltrative abnormality extends approximately 9 cm craniocaudal and 5.6 cm in diameter. The hila and other mediastinal spaces are negative for adenopathy. Lungs/Pleura: Small pleural effusions on both sides. There is subpleural extension of the mass with no visible nodularity along the pleural surface, although limited without contrast. Dependent atelectasis. No worrisome pulmonary nodules. Upper Abdomen: Partial coverage shows left hydronephrosis likely due to the retroperitoneal mass medial to the left psoas on prior CT. Right renal cystic change and atherosclerotic calcification. Cholecystectomy. Musculoskeletal: The posterior mediastinal mass is in close proximity to the spine but does not cause visible erosion or infiltrative lucency. No gross spinal canal mass. No evidence of hematogenous osseous metastases. IMPRESSION: 1. Approximately 9 x 6 cm infiltrative posterior mediastinal mass intimately associated with the descending aorta and thoracic spine. 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 2. Known left hydronephrosis related to infiltrative mass in the retroperitoneum. 3. Small pleural effusions with lower lobe atelectasis. Electronically Signed   By: Monte Fantasia M.D.   On: 11/18/2019 11:27   CT GUIDED NEEDLE PLACEMENT  Result Date: 11/20/2019 INDICATION: No known primary, now with retroperitoneal mass worrisome for lymphoma. Please perform CT-guided biopsy for tissue diagnostic purposes. EXAM: CT GUIDANCE NEEDLE PLACEMENT COMPARISON:  CT abdomen and pelvis-09/16/2020  MEDICATIONS: None. ANESTHESIA/SEDATION: Fentanyl 50 mcg IV; Versed 1 mg IV Sedation time: 16 minutes; The patient was continuously monitored during the procedure by the interventional radiology nurse under my direct supervision. CONTRAST:  None. COMPLICATIONS: None immediate. PROCEDURE: Informed consent was obtained from the patient following an explanation of the procedure, risks, benefits and alternatives. A time out was performed prior to the initiation of the procedure. The patient was positioned prone on the CT table and a limited CT was performed for procedural planning demonstrating unchanged size and appearance the left-sided retroperitoneal mass, presumed nodal conglomeration with dominant component measuring approximately 4.5 x 2.6 cm (image 21, series 3). The procedure was planned. The operative site was prepped and draped in the usual sterile fashion. Appropriate trajectory was confirmed with a 22 gauge spinal needle after the adjacent tissues were anesthetized with 1% Lidocaine with epinephrine. Under intermittent CT guidance, a 17 gauge coaxial needle was advanced into the peripheral aspect of the mass. Appropriate positioning was confirmed and 6 core needle biopsy samples were obtained with an 18 gauge core needle biopsy device. The co-axial needle was removed following the administration of a Gel-Foam slurry and superficial hemostasis was achieved with manual compression. A limited postprocedural CT was negative for hemorrhage or additional complication. A dressing was placed.  The patient tolerated the procedure well without immediate postprocedural complication. IMPRESSION: Technically successful CT guided core needle biopsy of left-sided retroperitoneal mass, presumed nodal conglomeration. Electronically Signed   By: Sandi Mariscal M.D.   On: 11/20/2019 09:43   DG C-Arm 1-60 Min-No Report  Result Date: 11/19/2019 Fluoroscopy was utilized by the requesting physician.  No radiographic  interpretation.    ASSESSMENT: Malignant retroperitoneal mass.  PLAN:    1. Malignant retroperitoneal mass: Malignant cells are present, but there is insufficient information for further classification.  Cells do have a plasmacytoid appearance.  Proceed with PET scan scheduled December 07, 2019 to assess if there is any other sites of disease for possible biopsy.  If mass is only site of disease, can consider XRT for palliative treatment.  Patient will also require MRI of the brain to complete the staging work-up.  She will have video assisted telemedicine visit on February 2 to discuss the final results and treatment planning. 2.  Dehydration: Patient was given 1 L of IV fluids along with 10 mg IV Decadron today. 3.  Poor appetite/weight loss: Patient was given a prescription for Megace.  She was also previously given a referral to dietary. 4.  Declining performance status: Outpatient palliative care consult has been placed.  I spent a total of 30 minutes reviewing chart data, face-to-face evaluation with the patient, counseling and coordination of care as detailed above.   Patient expressed understanding and was in agreement with this plan. She also understands that She can call clinic at any time with any questions, concerns, or complaints.   Cancer Staging No matching staging information was found for the patient.  Lloyd Huger, MD   11/27/2019 6:45 PM

## 2019-11-28 ENCOUNTER — Other Ambulatory Visit: Payer: Self-pay | Admitting: Oncology

## 2019-11-28 MED ORDER — SULFAMETHOXAZOLE-TRIMETHOPRIM 800-160 MG PO TABS: 1.0000 | ORAL_TABLET | Freq: Two times a day (BID) | ORAL | 0 refills | Status: DC

## 2019-11-29 LAB — URINE CULTURE: Culture: >=100000 — AB

## 2019-11-30 ENCOUNTER — Other Ambulatory Visit: Payer: Self-pay

## 2019-11-30 ENCOUNTER — Other Ambulatory Visit: Payer: Medicare Other

## 2019-11-30 DIAGNOSIS — N131 Hydronephrosis with ureteral stricture, not elsewhere classified: Secondary | ICD-10-CM | POA: Diagnosis not present

## 2019-11-30 DIAGNOSIS — Z515 Encounter for palliative care: Secondary | ICD-10-CM

## 2019-11-30 DIAGNOSIS — E119 Type 2 diabetes mellitus without complications: Secondary | ICD-10-CM | POA: Diagnosis not present

## 2019-11-30 DIAGNOSIS — Z48816 Encounter for surgical aftercare following surgery on the genitourinary system: Secondary | ICD-10-CM | POA: Diagnosis not present

## 2019-11-30 DIAGNOSIS — N179 Acute kidney failure, unspecified: Secondary | ICD-10-CM | POA: Diagnosis not present

## 2019-11-30 DIAGNOSIS — K5792 Diverticulitis of intestine, part unspecified, without perforation or abscess without bleeding: Secondary | ICD-10-CM | POA: Diagnosis not present

## 2019-11-30 DIAGNOSIS — I10 Essential (primary) hypertension: Secondary | ICD-10-CM | POA: Diagnosis not present

## 2019-11-30 NOTE — Progress Notes (Signed)
COMMUNITY PALLIATIVE CARE SW NOTE  PATIENT NAME: Regina Harris DOB: 03/27/1936 MRN: [id number]  PRIMARY CARE PROVIDER: Abner Greenspan, MD  RESPONSIBLE PARTY:  Acct ID - Guarantor Home Phone Work Phone Relationship Acct Type  [id number] - Colglazier,Kinleigh* [phone]  Self P/F     [address], [place], [place] [address]     PLAN OF CARE and INTERVENTIONS:             1. GOALS OF CARE/ ADVANCE CARE PLANNING:  Patient is a FULL CODE. MOST form is discussed. HCPOA is [name] (patient's granddaughter). Living Will is complete. SW requested copies. Goal is to "get better" and decide on a care plan following scans on Monday.  2. SOCIAL/EMOTIONAL/SPIRITUAL ASSESSMENT/ INTERVENTIONS:  SW and RN met with patient in the home. [name] (patient's caregiver) was present and [name] called in. Patient denies pain. Patient said her appetite is fair. Patient reports that she is sleeping well, but is sleeping more. [name] said she tries to encourage but patient continues to ask to "go back to bed" throughout the day. Patient and [name] report SOB, more within the past three days. Patient is currently on an antibiotic for UTI. [name] notes recurrent UTIs and concern for patient's weakness. Patient is a widow. Patient is retired, worked at [employer] for over 40 years. Patient has one adult daughter that is local, and [name], her only granddaughter that lives "minutes away". Patient also has two great grandchildren and showed team pictures of them and bragged about their success with school. SW discussed palliative care services, provided emotional support, discussed goals and used active and reflective listening.  3. PATIENT/CAREGIVER EDUCATION/ COPING:  Patient was alert, forgetful. Patient is calm. [name] denies any anxiety concerns. Patient feels that she is coping well. Patient tries to remain positive and "not complain". Patient enjoys spending time with family and watching game shows on television. 4. PERSONAL  EMERGENCY PLAN:  Patient/family will call 9-1-1 for emergencies.  5. COMMUNITY RESOURCES COORDINATION/ HEALTH CARE NAVIGATION:  Patient said she has 24-hour care between caregiver and family. [name] manages patient's care. Patient is scheduled for a PET scan and MRI on Monday, and will follow-up with results and discussion with MD on Tuesday.   6. FINANCIAL/LEGAL CONCERNS/INTERVENTIONS:  None. [name] is requesting copies of HCPOA/Living Will for SW to scan.      SOCIAL HX:  Social History   Tobacco Use  . Smoking status: Former Smoker    Packs/day: 1.00    Types: Cigarettes  . Smokeless tobacco: Never Used  Substance Use Topics  . Alcohol use: No    Alcohol/week: 0.0 standard drinks    CODE STATUS:   Code Status: Prior (FULL CODE) ADVANCED DIRECTIVES: Y MOST FORM COMPLETE:  Discussed and interested in completing.  HOSPICE EDUCATION PROVIDED: None.  PPS: Patient is ambulating with a walker. Patient needs assistance with tolieting, bathing and dressing. Patient is able to feed herself.   I spent 45 minutes with patient/family, from 2:30-3:15p providing education, support and consultation.   Margaretmary Lombard, LCSW

## 2019-11-30 NOTE — Progress Notes (Signed)
PATIENT NAME: Regina Harris DOB: 30-Oct-1936 MRN: [id number]  PRIMARY CARE PROVIDER: Abner Greenspan, MD  RESPONSIBLE PARTY:  Acct ID - Guarantor Home Phone Work Phone Relationship Acct Type  [id number] - Joos,[phone]  Self P/F     [address], [place], [place] [address]    PLAN OF CARE and INTERVENTIONS:               1.  GOALS OF CARE/ ADVANCE CARE PLANNING:  Remain at home and "get better."               2.  PATIENT/CAREGIVER EDUCATION:  Education on fall precautions, education on s/s of infections, reviewed meds, support               3.  DISEASE STATUS: SW and RN made scheduled home care visit. Patient sitting in her recliner, hired caregiver [name] in home with patient. SW and RN  talk with patient's granddaughter Hansel Starling as well as patient and caregiver about palliative care services.  Patient and granddaughter in agreement with palliative care services. Patient sees [facility]. [facility]land [facility] the [facility]. Patients granddaughter reports patient is still waiting on pathology report to determine if patient has Lymphoma.  Granddaughter reports MD has not discussed what treatment course will be for patient.   Patient was hospitalized and had to have a stent put in kidney. Patient also has UTI and is currently on antibiotic for UTI. Patient is weak and sleeping most of the time. Granddaughter reports Dr. Grayland Ormond is willing to administer fluids as needed to patient.  Patient uses rollator walker when ambulating. Patient denies suffering any recent falls.  Patient is weak and is sleeping a lot. Caregiver reports patient just got out of bed prior to palliative care team's arrival. Patient having increased shortness of breath and [name] reports patients weight this AM was 204.6 pounds.  Patient weight was 200 lbs.  Patient is receiving Lasix and [name] to contact Dr. Virgel Manifold office if patient continues to have weight gain to see if MD wants patient to take extra Lasix.  Patient was also  started on Megace and also took Decadron for a few days.  [name] feels this has made patient feel better.  Education to patient on fall precautions, s/s of infection.  Patient ate a grilled cheese sandwich, chips and Jello while palliative care team was in the home. . Patient admits having shortness of breath but denies having any cough. Patient has no open areas of skin breakdown. Patient has 2+ edema in lower extremities. Patient to have scans completed on Monday and willl see dr. Grayland Ormond next Tuesday.  [name] reports patient mau need to have another biopsy done.  Patient, granddaughter and caregiver encouraged to contact palliative care with questions or concerns.     HISTORY OF PRESENT ILLNESS:  Patient is a 84 year old female who resides in her home.  Patient has a caregiver in the home and patient is not left alone at night. Patient will be followed by palliauve care services and will be seen monthly and PRN.    CODE STATUS: Full Code SW to discuss MOST form with patient/family.  ADVANCED DIRECTIVES: Y MOST FORM: No PPS: 50%   PHYSICAL EXAM:   VITALS: Today's Vitals   11/30/19 1500  BP: (!) 106/94  Pulse: 91  Resp: 20  Temp: 98.4 F (36.9 C)  TempSrc: Temporal  SpO2: 95%  Weight: 204 lb 9.6 oz (92.8 kg)  PainSc: 0-No pain  LUNGS: decreased breath sounds CARDIAC: Cor RRR  EXTREMITIES: 2+ edema SKIN: Skin color, texture, turgor normal. No rashes or lesions  NEURO: positive for gait problems and weakness       Nilda Simmer, RN

## 2019-12-01 ENCOUNTER — Other Ambulatory Visit: Payer: Self-pay | Admitting: *Deleted

## 2019-12-01 NOTE — Patient Outreach (Signed)
[facility]) Care Management  12/01/2019  Regina Harris [birth year] [id number]   EMMI-GENERAL DISCHARGE/ Transition of care post d/c 1/15 RED ON EMMI ALERT Day #1 Date: 11/22/2019 Red Alert Reason:No follow up appointment  Outreach #3 RN attempted to contact pt twice on 1/19 however pt was sleeping. Today would be the 3rd outreach attempt however remains unsuccessful. RN unable to leave a voice message at this time.   Plan: Will attempt another outreach next and send outreach letter.  Raina Mina, RN Care Management Coordinator [place] Office [phone]

## 2019-12-03 DIAGNOSIS — N131 Hydronephrosis with ureteral stricture, not elsewhere classified: Secondary | ICD-10-CM | POA: Diagnosis not present

## 2019-12-03 DIAGNOSIS — E119 Type 2 diabetes mellitus without complications: Secondary | ICD-10-CM | POA: Diagnosis not present

## 2019-12-03 DIAGNOSIS — I10 Essential (primary) hypertension: Secondary | ICD-10-CM | POA: Diagnosis not present

## 2019-12-03 DIAGNOSIS — N179 Acute kidney failure, unspecified: Secondary | ICD-10-CM | POA: Diagnosis not present

## 2019-12-03 DIAGNOSIS — Z48816 Encounter for surgical aftercare following surgery on the genitourinary system: Secondary | ICD-10-CM | POA: Diagnosis not present

## 2019-12-03 DIAGNOSIS — K5792 Diverticulitis of intestine, part unspecified, without perforation or abscess without bleeding: Secondary | ICD-10-CM | POA: Diagnosis not present

## 2019-12-04 DIAGNOSIS — R1900 Intra-abdominal and pelvic swelling, mass and lump, unspecified site: Secondary | ICD-10-CM | POA: Insufficient documentation

## 2019-12-04 NOTE — Progress Notes (Signed)
  [employer]  [phone] [phone]) [phone]  ID: Regina Harris OB: September 22, 1936  MR#: [id number]  HAL#:[id number]   Lloyd Huger, MD   12/04/2019 6:48 AM     This encounter was created in error - please disregard.

## 2019-12-07 ENCOUNTER — Encounter: Payer: Self-pay | Admitting: Oncology

## 2019-12-07 ENCOUNTER — Ambulatory Visit
Admission: RE | Admit: 2019-12-07 | Discharge: 2019-12-07 | Disposition: A | Discharge status: Home / Self Care | Payer: Medicare Other | Source: Ambulatory Visit | Attending: Oncology | Admitting: Oncology

## 2019-12-07 ENCOUNTER — Other Ambulatory Visit: Payer: Self-pay

## 2019-12-07 DIAGNOSIS — I700 Atherosclerosis of aorta: Secondary | ICD-10-CM | POA: Insufficient documentation

## 2019-12-07 DIAGNOSIS — C8592 Non-Hodgkin lymphoma, unspecified, intrathoracic lymph nodes: Secondary | ICD-10-CM

## 2019-12-07 DIAGNOSIS — M899 Disorder of bone, unspecified: Secondary | ICD-10-CM | POA: Diagnosis not present

## 2019-12-07 DIAGNOSIS — Z79899 Other long term (current) drug therapy: Secondary | ICD-10-CM | POA: Diagnosis not present

## 2019-12-07 DIAGNOSIS — J90 Pleural effusion, not elsewhere classified: Secondary | ICD-10-CM | POA: Diagnosis not present

## 2019-12-07 DIAGNOSIS — N1330 Unspecified hydronephrosis: Secondary | ICD-10-CM | POA: Diagnosis not present

## 2019-12-07 DIAGNOSIS — C8590 Non-Hodgkin lymphoma, unspecified, unspecified site: Secondary | ICD-10-CM | POA: Diagnosis not present

## 2019-12-07 LAB — GLUCOSE, CAPILLARY: Glucose-Capillary: 81 mg/dL (ref 70–99)

## 2019-12-07 MED ORDER — FLUDEOXYGLUCOSE F - 18 (FDG) INJECTION
10.6000 | Freq: Once | INTRAVENOUS | Status: AC | PRN
  Administered 2019-12-07: 12:00:00 11.82 via INTRAVENOUS

## 2019-12-07 MED ORDER — GADOBUTROL 1 MMOL/ML IV SOLN
9.0000 mL | Freq: Once | INTRAVENOUS | Status: AC | PRN
  Administered 2019-12-07: 15:00:00 9 mL via INTRAVENOUS

## 2019-12-08 ENCOUNTER — Other Ambulatory Visit: Payer: Self-pay | Admitting: Oncology

## 2019-12-08 ENCOUNTER — Inpatient Hospital Stay: Payer: Medicare Other | Admitting: Oncology

## 2019-12-08 DIAGNOSIS — Z48816 Encounter for surgical aftercare following surgery on the genitourinary system: Secondary | ICD-10-CM | POA: Diagnosis not present

## 2019-12-08 DIAGNOSIS — I10 Essential (primary) hypertension: Secondary | ICD-10-CM | POA: Diagnosis not present

## 2019-12-08 DIAGNOSIS — K5792 Diverticulitis of intestine, part unspecified, without perforation or abscess without bleeding: Secondary | ICD-10-CM | POA: Diagnosis not present

## 2019-12-08 DIAGNOSIS — N131 Hydronephrosis with ureteral stricture, not elsewhere classified: Secondary | ICD-10-CM | POA: Diagnosis not present

## 2019-12-08 DIAGNOSIS — E119 Type 2 diabetes mellitus without complications: Secondary | ICD-10-CM | POA: Diagnosis not present

## 2019-12-08 DIAGNOSIS — N179 Acute kidney failure, unspecified: Secondary | ICD-10-CM | POA: Diagnosis not present

## 2019-12-10 ENCOUNTER — Other Ambulatory Visit: Payer: Self-pay | Admitting: *Deleted

## 2019-12-10 ENCOUNTER — Other Ambulatory Visit: Payer: Self-pay | Admitting: Emergency Medicine

## 2019-12-10 ENCOUNTER — Telehealth: Payer: Self-pay

## 2019-12-10 ENCOUNTER — Inpatient Hospital Stay: Payer: Medicare Other | Attending: Oncology | Admitting: Oncology

## 2019-12-10 ENCOUNTER — Other Ambulatory Visit: Payer: Self-pay

## 2019-12-10 ENCOUNTER — Inpatient Hospital Stay: Payer: Medicare Other

## 2019-12-10 VITALS — BP 131/105 | HR 100 | Temp 97.3°F | Resp 21 | Wt 209.8 lb

## 2019-12-10 DIAGNOSIS — C8592 Non-Hodgkin lymphoma, unspecified, intrathoracic lymph nodes: Secondary | ICD-10-CM | POA: Diagnosis not present

## 2019-12-10 DIAGNOSIS — R530 Neoplastic (malignant) related fatigue: Secondary | ICD-10-CM | POA: Diagnosis not present

## 2019-12-10 DIAGNOSIS — C480 Malignant neoplasm of retroperitoneum: Secondary | ICD-10-CM | POA: Diagnosis not present

## 2019-12-10 DIAGNOSIS — R5383 Other fatigue: Secondary | ICD-10-CM | POA: Diagnosis not present

## 2019-12-10 DIAGNOSIS — R630 Anorexia: Secondary | ICD-10-CM | POA: Diagnosis not present

## 2019-12-10 DIAGNOSIS — R109 Unspecified abdominal pain: Secondary | ICD-10-CM | POA: Insufficient documentation

## 2019-12-10 DIAGNOSIS — J9859 Other diseases of mediastinum, not elsewhere classified: Secondary | ICD-10-CM

## 2019-12-10 DIAGNOSIS — N2889 Other specified disorders of kidney and ureter: Secondary | ICD-10-CM

## 2019-12-10 DIAGNOSIS — Z90710 Acquired absence of both cervix and uterus: Secondary | ICD-10-CM | POA: Insufficient documentation

## 2019-12-10 DIAGNOSIS — R1900 Intra-abdominal and pelvic swelling, mass and lump, unspecified site: Secondary | ICD-10-CM

## 2019-12-10 DIAGNOSIS — Z87891 Personal history of nicotine dependence: Secondary | ICD-10-CM | POA: Insufficient documentation

## 2019-12-10 DIAGNOSIS — R634 Abnormal weight loss: Secondary | ICD-10-CM | POA: Insufficient documentation

## 2019-12-10 DIAGNOSIS — C801 Malignant (primary) neoplasm, unspecified: Secondary | ICD-10-CM | POA: Diagnosis not present

## 2019-12-10 DIAGNOSIS — R978 Other abnormal tumor markers: Secondary | ICD-10-CM

## 2019-12-10 DIAGNOSIS — G8929 Other chronic pain: Secondary | ICD-10-CM | POA: Insufficient documentation

## 2019-12-10 DIAGNOSIS — N289 Disorder of kidney and ureter, unspecified: Secondary | ICD-10-CM | POA: Insufficient documentation

## 2019-12-10 DIAGNOSIS — R609 Edema, unspecified: Secondary | ICD-10-CM | POA: Insufficient documentation

## 2019-12-10 LAB — CBC WITH DIFFERENTIAL/PLATELET
Abs Immature Granulocytes: 0.03 10*3/uL (ref 0.00–0.07)
Basophils Absolute: 0 10*3/uL (ref 0.0–0.1)
Basophils Relative: 1 %
Eosinophils Absolute: 0.1 10*3/uL (ref 0.0–0.5)
Eosinophils Relative: 2 %
HCT: 40.1 % (ref 36.0–46.0)
Hemoglobin: 11.8 g/dL — ABNORMAL LOW (ref 12.0–15.0)
Immature Granulocytes: 1 %
Lymphocytes Relative: 24 %
Lymphs Abs: 1.1 10*3/uL (ref 0.7–4.0)
MCH: 30.6 pg (ref 26.0–34.0)
MCHC: 29.4 g/dL — ABNORMAL LOW (ref 30.0–36.0)
MCV: 103.9 fL — ABNORMAL HIGH (ref 80.0–100.0)
Monocytes Absolute: 0.5 10*3/uL (ref 0.1–1.0)
Monocytes Relative: 11 %
Neutro Abs: 2.8 10*3/uL (ref 1.7–7.7)
Neutrophils Relative %: 61 %
Platelets: 168 10*3/uL (ref 150–400)
RBC: 3.86 MIL/uL — ABNORMAL LOW (ref 3.87–5.11)
RDW: 12.3 % (ref 11.5–15.5)
WBC: 4.4 10*3/uL (ref 4.0–10.5)
nRBC: 0 % (ref 0.0–0.2)

## 2019-12-10 LAB — BASIC METABOLIC PANEL
Anion gap: 11 (ref 5–15)
BUN: 30 mg/dL — ABNORMAL HIGH (ref 8–23)
CO2: 18 mmol/L — ABNORMAL LOW (ref 22–32)
Calcium: 9 mg/dL (ref 8.9–10.3)
Chloride: 113 mmol/L — ABNORMAL HIGH (ref 98–111)
Creatinine, Ser: 2 mg/dL — ABNORMAL HIGH (ref 0.44–1.00)
GFR calc Af Amer: 26 mL/min — ABNORMAL LOW (ref >60)
GFR calc non Af Amer: 23 mL/min — ABNORMAL LOW (ref >60)
Glucose, Bld: 101 mg/dL — ABNORMAL HIGH (ref 70–99)
Potassium: 4.4 mmol/L (ref 3.5–5.1)
Sodium: 142 mmol/L (ref 135–145)

## 2019-12-10 MED ORDER — TRAMADOL HCL 50 MG PO TABS: 50.0000 mg | ORAL_TABLET | Freq: Four times a day (QID) | ORAL | 1 refills | Status: DC | PRN

## 2019-12-10 NOTE — Patient Outreach (Signed)
[facility]) Care Management  12/10/2019  BREAH JOA May 09, 1936 [id number]    EMMI-GENERAL DISCHARGE RED ON EMMI ALERT Day #1 Date: 11/22/2019 Red Alert Reason: No follow up appointment  Outreach #3 RN outreached to pt today concerning the above EMMI. Pt states this as been resolved with no additional issues or problems. No immediate needs as pt is doing well via ED follow up and is currently active with this RN case manager for ongoing prevention measures  Since Oct 2020. Note care plan recently updated accordingly with interventions in place.  Plan: EMMI closed with ongoing case management services. Will update primary accordingly.  Raina Mina, RN Care Management Coordinator [place] Office [phone]

## 2019-12-10 NOTE — Progress Notes (Signed)
Pt in for follow up, test results.

## 2019-12-10 NOTE — Patient Outreach (Signed)
[facility]) Care Management  12/10/2019  SAFIRE GORDIN 01-24-1936 [id number]    Telephone Assessment  RN spoke with pt and received an update pt states she is doing well with no major complaints or issues at this time. Pt recent discharged from the hospital and has followed up with her provider with no reported problems or issues.   RN review the current plan of care and will extend to allow ongoing management of care. Plan updated with interventions and RN will update provider of pt's disposition with [facility] services.  [facility] CM Care Plan Problem One     Most Recent Value  Care Plan Problem One  Preventive Measures related to recent SNF d/c  Role Documenting the Problem One  Care Management Telephonic Coordinator  Care Plan for Problem One  Active  [facility] Long Term Goal   Pt will not have any readmission to a hospital or SNF over the next 60 days.  [facility] Long Term Goal Start Date  09/07/19  Interventions for Problem One Long Term Goal  Will reiterate on prevention mesures to void readmission and continue to offer resources to assist with lowering the risk for readmissions.Raina Mina, RN Care Management Coordinator [place] Office [phone]

## 2019-12-10 NOTE — Telephone Encounter (Signed)
Telephone call to patients granddaughter [name] to schedule palliative care visit.  [name] in agreement with RN making home visit 12/17/19 at 12:00.

## 2019-12-10 NOTE — Progress Notes (Signed)
[facility]  Telephone:([phone]) [phone] Fax:(336) [phone]  ID: Regina Harris OB: Dec 11, 1935  MR#: [id number]  FVO#:[id number]  Patient Care Team: Abner Greenspan, MD as PCP - General Byrnett, Forest Gleason, MD (General Surgery) Tobi Bastos, RN as [facility]   CHIEF COMPLAINT: Malignant retroperitoneal mass.  INTERVAL HISTORY: Patient returns to clinic today for further evaluation and additional diagnostic planning.  She continues to have increased weakness and fatigue as well as a declining performance status.  She has a poor appetite.  She has chronic pain particularly in her left flank.  She has increased shortness of breath, but denies chest pain, cough, or hemoptysis.  She denies any nausea, vomiting, constipation, or diarrhea.  She has no further urinary complaints.  Patient offers no further specific complaints today.  REVIEW OF SYSTEMS:   Review of Systems  Constitutional: Positive for malaise/fatigue. Negative for fever and weight loss.  Respiratory: Negative.  Negative for cough, hemoptysis and shortness of breath.   Cardiovascular: Positive for leg swelling. Negative for chest pain.  Gastrointestinal: Negative for abdominal pain, blood in stool, diarrhea, melena and nausea.  Genitourinary: Positive for flank pain. Negative for dysuria.  Musculoskeletal: Negative for back pain.  Skin: Negative.  Negative for rash.  Neurological: Positive for weakness. Negative for dizziness, focal weakness and headaches.  Psychiatric/Behavioral: Negative.  The patient is not nervous/anxious.     As per HPI. Otherwise, a complete review of systems is negative.  PAST MEDICAL HISTORY: Past Medical History:  Diagnosis Date  . Barrett esophagus   . Carotid bruit   . Diabetes mellitus without complication ([place])   . Esophagitis   . Gastritis 2013  . GERD (gastroesophageal reflux disease)   . HH (hiatus hernia)   . History of colonic polyps   .  History of repair of right rotator cuff   . Hyperlipidemia   . Hypertension   . Hyperthyroidism 06/09/2018   Per [facility] RAI Therapy for Hyperthyroidism order  . Lump or mass in breast   . Pneumonia   . Polycythemia, secondary   . Tobacco abuse     PAST SURGICAL HISTORY: Past Surgical History:  Procedure Laterality Date  . ABDOMINAL HYSTERECTOMY    . APPENDECTOMY    . BREAST BIOPSY Right 1996  . BREAST BIOPSY Left 10/09/2012   Benign breast tissue with focal fat necrosis and focal periductal chronic inflammation.  Marland Kitchen BREAST BIOPSY Left 10/09/2012   Benign breast tissue with focal fat necrosis and focal periductal chronic inflammation.  Marland Kitchen CATARACT EXTRACTION  3/11   Dr Charise Killian  . CHOLECYSTECTOMY    . COLONOSCOPY  2009  . CYSTOSCOPY WITH URETEROSCOPY AND STENT PLACEMENT Left 11/19/2019   Procedure: CYSTOSCOPY WITH URETEROSCOPY AND STENT PLACEMENT;  Surgeon: Billey Co, MD;  Location: [facility] ORS;  Service: Urology;  Laterality: Left;  . ESOPHAGOGASTRODUODENOSCOPY (EGD) WITH PROPOFOL N/A 11/02/2015   Procedure: ESOPHAGOGASTRODUODENOSCOPY (EGD) WITH PROPOFOL;  Surgeon: Manya Silvas, MD;  Location: [facility] ENDOSCOPY;  Service: Endoscopy;  Laterality: N/A;  . SHOULDER ARTHROSCOPY WITH OPEN ROTATOR CUFF REPAIR Right 03/05/2016   Procedure: SHOULDER ARTHROSCOPY WITH OPEN ROTATOR CUFF REPAIR;  Surgeon: Earnestine Leys, MD;  Location: [facility] ORS;  Service: Orthopedics;  Laterality: Right;  . UPPER GI ENDOSCOPY  2013    FAMILY HISTORY: Family History  Problem Relation Age of Onset  . Kidney cancer Father   . Diabetes Mother   . Breast cancer Neg Hx     ADVANCED DIRECTIVES (  Y/N):  N  HEALTH MAINTENANCE: Social History   Tobacco Use  . Smoking status: Former Smoker    Packs/day: 1.00    Types: Cigarettes  . Smokeless tobacco: Never Used  Substance Use Topics  . Alcohol use: No    Alcohol/week: 0.0 standard drinks  . Drug use: No     Colonoscopy:  PAP:  Bone density:  Lipid  panel:  Allergies  Allergen Reactions  . Benadryl [Diphenhydramine] Other (See Comments)    Hallucinations; family request to not give this medication.    Current Outpatient Medications  Medication Sig Dispense Refill  . acetaminophen (TYLENOL) 500 MG tablet Take 2 tablets (1,000 mg total) by mouth 3 (three) times daily. 30 tablet 0  . aspirin EC 81 MG tablet Take 81 mg by mouth daily.    Marland Kitchen atorvastatin (LIPITOR) 10 MG tablet TAKE 1 TABLET BY MOUTH ONCE DAILY (Patient taking differently: Take 10 mg by mouth daily. ) 90 tablet 1  . Calcium Carb-Cholecalciferol (CALCIUM 1000 + D) 1000-800 MG-UNIT TABS Take 1 tablet by mouth 2 (two) times daily.     . cyanocobalamin 1000 MCG tablet Take 1,000 mcg by mouth daily.    Marland Kitchen donepezil (ARICEPT) 5 MG tablet TAKE 1 TABLET BY MOUTH AT BEDTIME 30 tablet 5  . furosemide (LASIX) 20 MG tablet TAKE 1 TABLET BY MOUTH ONCE A DAY (Patient taking differently: Take 20 mg by mouth daily. ) 90 tablet 1  . gabapentin (NEURONTIN) 400 MG capsule TAKE 1 CAPSULE BY MOUTH TWICE DAILY (Patient taking differently: Take 400 mg by mouth 2 (two) times daily. ) 180 capsule 1  . levothyroxine (SYNTHROID) 75 MCG tablet Take 75 mcg by mouth daily with breakfast.     . megestrol (MEGACE) 40 MG tablet Take 1 tablet (40 mg total) by mouth daily. 30 tablet 1  . omeprazole (PRILOSEC) 20 MG capsule TAKE 1 CAPSULE BY MOUTH TWICE DAILY (Patient taking differently: Take 20 mg by mouth 2 (two) times daily. ) 180 capsule 1  . potassium chloride SA (K-DUR) 20 MEQ tablet TAKE 1 TABLET BY MOUTH TWICE (2) DAILY (Patient taking differently: Take 20 mEq by mouth 2 (two) times daily. ) 180 tablet 1  . zinc sulfate 220 (50 Zn) MG capsule Take 1 capsule (220 mg total) by mouth daily.    . traMADol (ULTRAM) 50 MG tablet Take 1 tablet (50 mg total) by mouth every 6 (six) hours as needed. 60 tablet 1   No current facility-administered medications for this visit.    OBJECTIVE: Vitals:   12/10/19 1511  12/10/19 1514  BP: (!) 131/105   Pulse: 100   Resp: (!) 21   Temp: (!) 97.3 F (36.3 C)   SpO2:  97%     Body mass index is 32.86 kg/m.    ECOG FS:3 - Symptomatic, >50% confined to bed  General: Ill-appearing, no acute distress.  Sitting in a wheelchair. Eyes: Pink conjunctiva, anicteric sclera. HEENT: Normocephalic, moist mucous membranes. Lungs: No audible wheezing or coughing. Heart: Regular rate and rhythm. Abdomen: Soft, nontender, no obvious distention. Musculoskeletal: 1-2+ bilateral peripheral edema. Neuro: Alert. Cranial nerves grossly intact. Skin: No rashes or petechiae noted. Psych: Normal affect.  LAB RESULTS:  Lab Results  Component Value Date   NA 142 12/10/2019   K 4.4 12/10/2019   CL 113 (H) 12/10/2019   CO2 18 (L) 12/10/2019   GLUCOSE 101 (H) 12/10/2019   BUN 30 (H) 12/10/2019   CREATININE 2.00 (H) 12/10/2019  CALCIUM 9.0 12/10/2019   PROT 6.9 11/27/2019   ALBUMIN 2.8 (L) 11/27/2019   AST 11 (L) 11/27/2019   ALT 8 11/27/2019   ALKPHOS 54 11/27/2019   BILITOT 0.5 11/27/2019   GFRNONAA 23 (L) 12/10/2019   GFRAA 26 (L) 12/10/2019    Lab Results  Component Value Date   WBC 4.4 12/10/2019   NEUTROABS 2.8 12/10/2019   HGB 11.8 (L) 12/10/2019   HCT 40.1 12/10/2019   MCV 103.9 (H) 12/10/2019   PLT 168 12/10/2019     STUDIES: CT ABDOMEN PELVIS WO CONTRAST  Result Date: 11/17/2019 CLINICAL DATA:  84 year old female with left-sided abdominal pain. Concern for acute diverticulitis. EXAM: CT ABDOMEN AND PELVIS WITHOUT CONTRAST TECHNIQUE: Multidetector CT imaging of the abdomen and pelvis was performed following the standard protocol without IV contrast. COMPARISON:  CT abdomen pelvis dated 06/13/2006. FINDINGS: Evaluation of this exam is limited in the absence of intravenous contrast. Lower chest: Trace left pleural effusion. There is thickened appearance of the posteromedial lower lobe pleural surfaces. Partially visualized infiltrative mass encasing  the distal descending thoracic aorta and abutting the lower thoracic spine at T10-T11. This may represent an infiltrative neoplasm, lymphoma, or fibrosis versus sequela osteomyelitis/discitis. This is new since the CT of 07/17/2010. Further evaluation with MRI without and with contrast is recommended. Bibasilar subpleural linear and streaky densities may represent atelectasis/scarring. Infiltrate is less likely. Clinical correlation is recommended. There is no intra-abdominal free air or free fluid. Hepatobiliary: The liver is unremarkable. No intrahepatic biliary ductal dilatation. Cholecystectomy. No retained calcified stone noted in the central CBD. Pancreas: There is mild haziness of the distal pancreas which may represent acute pancreatitis. Correlation with pancreatic enzymes recommended. No drainable fluid collection/abscess or pseudocyst Spleen: Normal in size without focal abnormality. Adrenals/Urinary Tract: There is a 2 cm left adrenal adenoma. The right adrenal gland is unremarkable. There is mild left hydronephrosis. No calcified stone identified. There is haziness and stranding of the proximal left periureteric fat which may represent an infection/inflammatory process or fibrosis and possible degree of ureteral stricture. There is a 2 cm left renal inferior pole hypodense lesion which is not characterized but demonstrates fluid attenuation most consistent with a cyst. There is no hydronephrosis or nephrolithiasis on the right. Multiple right renal cysts measure up to approximately 7 cm. The right ureter and urinary bladder appear unremarkable. Stomach/Bowel: There is sigmoid diverticulosis with muscular hypertrophy. Mild perisigmoid stranding and haziness may represent mild acute diverticulitis. Clinical correlation is recommended. There is diffuse thickened appearance of the colon which may be related to underdistention. Diffuse submucosal fat deposit along the colonic wall likely sequela of chronic  inflammatory process. There is no bowel obstruction. Appendectomy. Vascular/Lymphatic: Advanced aortoiliac atherosclerotic disease. There is stranding of the periaortic fat. There is a 3.8 x 2.1 cm left para-aortic/paraspinal soft tissue lesion which is not characterized but may represent enlarged lymph node (series 2 image 38). However, this soft tissue appears to abut the left L4 vertebra and therefore a neoplasm or an infectious process is not excluded. No portal venous gas. The IVC is unremarkable. Reproductive: Hysterectomy. Other: None Musculoskeletal: Osteopenia with extensive multilevel degenerative changes, disc desiccation and vacuum phenomena. Old-appearing multilevel compression fractures most prominent involving L3 and L4. No definite acute fracture. No retropulsed fragment. IMPRESSION: 1. Sigmoid diverticulosis with findings concerning for mild diverticulitis. No diverticular abscess or perforation. 2. No bowel obstruction. 3. Mild left hydronephrosis, possibly secondary to a degree of stricture or decreased peristalsis of the left ureter  due to adjacent inflammatory process or fibrosis. No stone. 4. Mild stranding of the distal pancreas. Correlation with pancreatic enzymes recommended to evaluate for acute pancreatitis. No fluid collection. 5. Infiltrative soft tissue mass along the lower thoracic spine at T10-T11 with encasement of the aorta. This may represent an inflammatory/infectious process (osteomyelitis/discitis) or an infiltrative neoplasm. Further evaluation with MRI without and with contrast is recommended. 6. Thickened appearance of the inferior medial lower lobe pleural surfaces which may be secondary to infiltration of paraspinal/periaortic mass or sequela of chronic inflammation. 7. Left para-aortic soft tissue density to the left of L4 may represent adenopathy or a neoplasm. Electronically Signed   By: Anner Crete M.D.   On: 11/17/2019 18:09   CT CHEST WO CONTRAST  Result  Date: 11/18/2019 CLINICAL DATA:  Adenopathy EXAM: CT CHEST WITHOUT CONTRAST TECHNIQUE: Multidetector CT imaging of the chest was performed following the standard protocol without IV contrast. COMPARISON:  Chest CT 07/17/2010 FINDINGS: Cardiovascular: Normal heart size. No pericardial effusion. Aortic and coronary atherosclerosis. Mediastinum/Nodes: Infiltrative posterior mediastinal mass partially encasing the descending aorta with indistinguishable plane between the mass and the posterior wall of the aorta separate from areas of intimal calcification. The infiltrative abnormality extends approximately 9 cm craniocaudal and 5.6 cm in diameter. The hila and other mediastinal spaces are negative for adenopathy. Lungs/Pleura: Small pleural effusions on both sides. There is subpleural extension of the mass with no visible nodularity along the pleural surface, although limited without contrast. Dependent atelectasis. No worrisome pulmonary nodules. Upper Abdomen: Partial coverage shows left hydronephrosis likely due to the retroperitoneal mass medial to the left psoas on prior CT. Right renal cystic change and atherosclerotic calcification. Cholecystectomy. Musculoskeletal: The posterior mediastinal mass is in close proximity to the spine but does not cause visible erosion or infiltrative lucency. No gross spinal canal mass. No evidence of hematogenous osseous metastases. IMPRESSION: 1. Approximately 9 x 6 cm infiltrative posterior mediastinal mass intimately associated with the descending aorta and thoracic spine. 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 2. Known left hydronephrosis related to infiltrative mass in the retroperitoneum. 3. Small pleural effusions with lower lobe atelectasis. Electronically Signed   By: Monte Fantasia M.D.   On: 11/18/2019 11:27   CT GUIDED NEEDLE PLACEMENT  Result Date: 11/20/2019 INDICATION: No known primary, now with retroperitoneal mass worrisome for lymphoma. Please perform CT-guided biopsy for  tissue diagnostic purposes. EXAM: CT GUIDANCE NEEDLE PLACEMENT COMPARISON:  CT abdomen and pelvis-09/16/2020 MEDICATIONS: None. ANESTHESIA/SEDATION: Fentanyl 50 mcg IV; Versed 1 mg IV Sedation time: 16 minutes; The patient was continuously monitored during the procedure by the interventional radiology nurse under my direct supervision. CONTRAST:  None. COMPLICATIONS: None immediate. PROCEDURE: Informed consent was obtained from the patient following an explanation of the procedure, risks, benefits and alternatives. A time out was performed prior to the initiation of the procedure. The patient was positioned prone on the CT table and a limited CT was performed for procedural planning demonstrating unchanged size and appearance the left-sided retroperitoneal mass, presumed nodal conglomeration with dominant component measuring approximately 4.5 x 2.6 cm (image 21, series 3). The procedure was planned. The operative site was prepped and draped in the usual sterile fashion. Appropriate trajectory was confirmed with a 22 gauge spinal needle after the adjacent tissues were anesthetized with 1% Lidocaine with epinephrine. Under intermittent CT guidance, a 17 gauge coaxial needle was advanced into the peripheral aspect of the mass. Appropriate positioning was confirmed and 6 core needle biopsy samples were obtained with an 18 gauge  core needle biopsy device. The co-axial needle was removed following the administration of a Gel-Foam slurry and superficial hemostasis was achieved with manual compression. A limited postprocedural CT was negative for hemorrhage or additional complication. A dressing was placed. The patient tolerated the procedure well without immediate postprocedural complication. IMPRESSION: Technically successful CT guided core needle biopsy of left-sided retroperitoneal mass, presumed nodal conglomeration. Electronically Signed   By: Sandi Mariscal M.D.   On: 11/20/2019 09:43   MR Brain W Wo  Contrast  Result Date: 12/07/2019 CLINICAL DATA:  Lymphoma. EXAM: MRI HEAD WITHOUT AND WITH CONTRAST TECHNIQUE: Multiplanar, multiecho pulse sequences of the brain and surrounding structures were obtained without and with intravenous contrast. CONTRAST:  89m GADAVIST GADOBUTROL 1 MMOL/ML IV SOLN COMPARISON:  Head CT 07/29/2019 FINDINGS: The study is moderately motion degraded. Brain: There is no evidence of acute infarct, intracranial hemorrhage, mass, midline shift, or extra-axial fluid collection. There is mild to moderate cerebral atrophy. T2 hyperintensities in the cerebral white matter bilaterally are nonspecific but compatible with mild chronic small vessel ischemic disease. No enhancing lesions are identified, however note that small lesions could be easily obscured by the degree of motion artifact. Vascular: Major intracranial vascular flow voids are preserved. Skull and upper cervical spine: No gross skull lesion identified. Sinuses/Orbits: Bilateral cataract extraction. Mild right sphenoid sinus mucosal thickening. Small left mastoid effusion. Other: None. IMPRESSION: 1. Motion degraded examination without evidence of intracranial metastases. 2. Mild chronic small vessel ischemic disease. Electronically Signed   By: ALogan BoresM.D.   On: 12/07/2019 15:52   NM PET Image Initial (PI) Skull Base To Thigh  Result Date: 12/07/2019 CLINICAL DATA:  Initial treatment strategy for lymphoma. EXAM: NUCLEAR MEDICINE PET SKULL BASE TO THIGH TECHNIQUE: 11.8 mCi F-18 FDG was injected intravenously. Full-ring PET imaging was performed from the skull base to thigh after the radiotracer. CT data was obtained and used for attenuation correction and anatomic localization. Fasting blood glucose: 81 mg/dl COMPARISON:  CT chest abdomen and pelvis 11/18/2019 FINDINGS: Mediastinal blood pool activity: SUV max 2.6 Liver activity: SUV max 3.14. NECK: No hypermetabolic lymph nodes in the neck. Incidental CT findings: none  CHEST: Paraspinal soft tissue mass within the posterior mediastinum measures 7.3 x 6.9 cm with SUV max of 3.59. Masslike architectural distortion within the lingula is identified, nonspecific measuring 3.9 x 2.2 cm with SUV max 2.48. There are bilateral pleural effusions, left greater than right. Increased tracer uptake within the left pleural effusion has an SUV max of 2.25. Cannot exclude malignant effusion. Incidental CT findings: Aortic atherosclerosis. Lad, left circumflex and RCA coronary artery calcifications identified. ABDOMEN/PELVIS: Within the left retroperitoneum extending into the paraspinal region and involving the left psoas muscle. This measures 4.1 x 4.7 cm and has SUV max of 5.0. Multiple small FDG avid right retroperitoneal and right upper quadrant lymph nodes are identified. I suspect, there is asymmetric involvement of the right renal hilum with associated mild hydronephrosis. Difficulty to measure lymph nodes in this region individually. The SUV max within the right retroperitoneum is equal to 4.30. There is mild increased radiotracer uptake within both adrenal glands are identified which appear mildly enlarged. SUV max within the left adrenal gland is equal to 3.38. SUV max associated with the right adrenal gland is equal to 4.67. Incidental CT findings: Cholecystectomy. Aortic atherosclerosis. Mild right hydronephrosis. Status post left nephroureteral stent placement with mild residual hydronephrosis. Multiple large right kidney cysts identified. Small volume of free fluid noted within the abdomen  and pelvis. SKELETON: Multifocal abnormal areas of increased radiotracer uptake are identified involving the axial and appendicular skeleton compatible with osseous involvement by lymphoma. Asymmetric increased uptake within the proximal left humerus has an SUV max of 3.23. Asymmetric increased uptake localizing to the right side of the sternal manubrium has an SUV max of 3.67. Underlying lucent  bone lesion is identified measuring 1.4 cm. FDG avid lesion involving bilateral ribs. Index rib lesion on the left has an SUV max of 4.7. Multiple FDG avid lesions are seen within the pelvis. Index lesion within the left side of sacrum has an SUV max of 4.39. Diffuse heterogeneous uptake throughout the spine noted with multiple FDG avid lesions. Index lesion involving the L4 vertebra has an SUV max of 4.77. Associated pathologic fracture involving the L4 vertebra is again noted. Incidental CT findings: none IMPRESSION: 1. Exam positive for FDG avid tumor within the paraspinal region of the posterior mediastinum. There is also an FDG avid tumor within the left retroperitoneum. Poorly defined FDG avid soft tissue infiltration and nodularity within the right retroperitoneum and right upper quadrant of the abdomen is noted with suspected involvement of the right renal hilum. 2. 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 Many of these FDG avid lesions have corresponding lucent bone lesions on CT. Hypermetabolic tumor involving the L4 vertebra is noted with corresponding pathologic fracture. 3. Nonspecific masslike architectural distortion with mild increased uptake is noted within the lingula. Cannot exclude pulmonary the involvement by lymphoma. 4. Bilateral pleural effusions, left greater than right. Malignant pleural effusions not excluded. 5. Interval placement of left-sided nephroureteral stent with residual mild left hydronephrosis. 6.  Aortic Atherosclerosis (ICD10-I70.0). Electronically Signed   By: Kerby Moors M.D.   On: 12/07/2019 14:57   DG C-Arm 1-60 Min-No Report  Result Date: 11/19/2019 Fluoroscopy was utilized by the requesting physician.  No radiographic interpretation.    ASSESSMENT: Malignant retroperitoneal mass.  PLAN:    1. Malignant retroperitoneal mass: Malignant cells are present, but there is insufficient information for further classification.  Cells do have a  plasmacytoid appearance.  MRI of the brain did not reveal any significant pathology.  PET scan results from December 07, 2019 reviewed independently and report as above.  Case was discussed at length tumor board today and recommendation was to repeat biopsy of retroperitoneal mass and do a bone marrow biopsy simultaneously.  Return to clinic proxy 1 week after biopsy to discuss the results and treatment planning if desired. 2.  Renal insufficiency: Patient's creatinine is trending up and is now 2.0.  Possibly secondary to dehydration, but hesitant to give fluids given her increasing edema.  Patient has a ureteral stent and hydronephrosis, therefore will get ultrasound of her kidneys to assess if the stent is still functioning.  Can refer back to urology if necessary. 3.  Pain: Patient was given a prescription for tramadol today. 4.  Peripheral edema/shortness of breath: Hold increasing Lasix given her renal insufficiency.   5.  Poor appetite/weight loss: Patient was previously given a prescription for Megace.  She was also previously given a referral to dietary. 6.  Declining performance status: Outpatient palliative care consult has been placed.    Patient expressed understanding and was in agreement with this plan. She also understands that She can call clinic at any time with any questions, concerns, or complaints.   Cancer Staging No matching staging information was found for the patient.  Lloyd Huger, MD   12/10/2019 6:49 PM

## 2019-12-11 ENCOUNTER — Ambulatory Visit
Admission: RE | Admit: 2019-12-11 | Discharge: 2019-12-11 | Disposition: A | Discharge status: Home / Self Care | Payer: Medicare Other | Source: Ambulatory Visit | Attending: Oncology | Admitting: Oncology

## 2019-12-11 ENCOUNTER — Telehealth: Payer: Self-pay | Admitting: Emergency Medicine

## 2019-12-11 ENCOUNTER — Other Ambulatory Visit: Payer: Self-pay | Admitting: Oncology

## 2019-12-11 ENCOUNTER — Other Ambulatory Visit: Payer: Self-pay

## 2019-12-11 ENCOUNTER — Inpatient Hospital Stay (HOSPITAL_COMMUNITY)
Admission: EM | Admit: 2019-12-11 | Discharge: 2019-12-15 | DRG: 841 | Disposition: A | Discharge status: Other Acute Inpatient Hospital | Payer: Medicare Other | Attending: Internal Medicine | Admitting: Internal Medicine

## 2019-12-11 DIAGNOSIS — C480 Malignant neoplasm of retroperitoneum: Secondary | ICD-10-CM | POA: Diagnosis not present

## 2019-12-11 DIAGNOSIS — D892 Hypergammaglobulinemia, unspecified: Secondary | ICD-10-CM | POA: Diagnosis present

## 2019-12-11 DIAGNOSIS — N2889 Other specified disorders of kidney and ureter: Secondary | ICD-10-CM | POA: Diagnosis not present

## 2019-12-11 DIAGNOSIS — E785 Hyperlipidemia, unspecified: Secondary | ICD-10-CM | POA: Diagnosis present

## 2019-12-11 DIAGNOSIS — K5732 Diverticulitis of large intestine without perforation or abscess without bleeding: Secondary | ICD-10-CM | POA: Diagnosis present

## 2019-12-11 DIAGNOSIS — Z66 Do not resuscitate: Secondary | ICD-10-CM | POA: Diagnosis present

## 2019-12-11 DIAGNOSIS — C9000 Multiple myeloma not having achieved remission: Secondary | ICD-10-CM | POA: Diagnosis not present

## 2019-12-11 DIAGNOSIS — K219 Gastro-esophageal reflux disease without esophagitis: Secondary | ICD-10-CM | POA: Diagnosis present

## 2019-12-11 DIAGNOSIS — C8590 Non-Hodgkin lymphoma, unspecified, unspecified site: Secondary | ICD-10-CM | POA: Diagnosis not present

## 2019-12-11 DIAGNOSIS — Z8051 Family history of malignant neoplasm of kidney: Secondary | ICD-10-CM

## 2019-12-11 DIAGNOSIS — R531 Weakness: Secondary | ICD-10-CM | POA: Diagnosis not present

## 2019-12-11 DIAGNOSIS — R410 Disorientation, unspecified: Secondary | ICD-10-CM | POA: Diagnosis not present

## 2019-12-11 DIAGNOSIS — Z8616 Personal history of COVID-19: Secondary | ICD-10-CM

## 2019-12-11 DIAGNOSIS — N179 Acute kidney failure, unspecified: Secondary | ICD-10-CM

## 2019-12-11 DIAGNOSIS — N1330 Unspecified hydronephrosis: Secondary | ICD-10-CM | POA: Diagnosis not present

## 2019-12-11 DIAGNOSIS — Z936 Other artificial openings of urinary tract status: Secondary | ICD-10-CM

## 2019-12-11 DIAGNOSIS — E878 Other disorders of electrolyte and fluid balance, not elsewhere classified: Secondary | ICD-10-CM | POA: Diagnosis present

## 2019-12-11 DIAGNOSIS — R079 Chest pain, unspecified: Secondary | ICD-10-CM | POA: Diagnosis not present

## 2019-12-11 DIAGNOSIS — Z20822 Contact with and (suspected) exposure to covid-19: Secondary | ICD-10-CM | POA: Diagnosis present

## 2019-12-11 DIAGNOSIS — R58 Hemorrhage, not elsewhere classified: Secondary | ICD-10-CM | POA: Diagnosis not present

## 2019-12-11 DIAGNOSIS — N390 Urinary tract infection, site not specified: Secondary | ICD-10-CM | POA: Diagnosis present

## 2019-12-11 DIAGNOSIS — M25562 Pain in left knee: Secondary | ICD-10-CM | POA: Diagnosis not present

## 2019-12-11 DIAGNOSIS — M79672 Pain in left foot: Secondary | ICD-10-CM | POA: Diagnosis not present

## 2019-12-11 DIAGNOSIS — S91115A Laceration without foreign body of left lesser toe(s) without damage to nail, initial encounter: Secondary | ICD-10-CM | POA: Diagnosis present

## 2019-12-11 DIAGNOSIS — Z515 Encounter for palliative care: Secondary | ICD-10-CM

## 2019-12-11 DIAGNOSIS — N281 Cyst of kidney, acquired: Secondary | ICD-10-CM | POA: Diagnosis not present

## 2019-12-11 DIAGNOSIS — S99922A Unspecified injury of left foot, initial encounter: Secondary | ICD-10-CM | POA: Diagnosis not present

## 2019-12-11 DIAGNOSIS — Z79899 Other long term (current) drug therapy: Secondary | ICD-10-CM

## 2019-12-11 DIAGNOSIS — R1909 Other intra-abdominal and pelvic swelling, mass and lump: Secondary | ICD-10-CM | POA: Diagnosis not present

## 2019-12-11 DIAGNOSIS — Z9889 Other specified postprocedural states: Secondary | ICD-10-CM

## 2019-12-11 DIAGNOSIS — Z79890 Hormone replacement therapy (postmenopausal): Secondary | ICD-10-CM

## 2019-12-11 DIAGNOSIS — K2970 Gastritis, unspecified, without bleeding: Secondary | ICD-10-CM | POA: Diagnosis present

## 2019-12-11 DIAGNOSIS — E039 Hypothyroidism, unspecified: Secondary | ICD-10-CM | POA: Diagnosis present

## 2019-12-11 DIAGNOSIS — N1339 Other hydronephrosis: Secondary | ICD-10-CM | POA: Diagnosis present

## 2019-12-11 DIAGNOSIS — Z7982 Long term (current) use of aspirin: Secondary | ICD-10-CM

## 2019-12-11 DIAGNOSIS — E872 Acidosis: Secondary | ICD-10-CM | POA: Diagnosis present

## 2019-12-11 DIAGNOSIS — S0990XA Unspecified injury of head, initial encounter: Secondary | ICD-10-CM | POA: Diagnosis not present

## 2019-12-11 DIAGNOSIS — S299XXA Unspecified injury of thorax, initial encounter: Secondary | ICD-10-CM | POA: Diagnosis not present

## 2019-12-11 DIAGNOSIS — S91312A Laceration without foreign body, left foot, initial encounter: Secondary | ICD-10-CM

## 2019-12-11 DIAGNOSIS — I10 Essential (primary) hypertension: Secondary | ICD-10-CM | POA: Diagnosis not present

## 2019-12-11 DIAGNOSIS — W19XXXA Unspecified fall, initial encounter: Secondary | ICD-10-CM | POA: Diagnosis present

## 2019-12-11 DIAGNOSIS — R1900 Intra-abdominal and pelvic swelling, mass and lump, unspecified site: Secondary | ICD-10-CM

## 2019-12-11 DIAGNOSIS — E8770 Fluid overload, unspecified: Secondary | ICD-10-CM | POA: Diagnosis present

## 2019-12-11 DIAGNOSIS — R5381 Other malaise: Secondary | ICD-10-CM | POA: Diagnosis present

## 2019-12-11 DIAGNOSIS — J910 Malignant pleural effusion: Secondary | ICD-10-CM

## 2019-12-11 DIAGNOSIS — N138 Other obstructive and reflux uropathy: Secondary | ICD-10-CM | POA: Diagnosis present

## 2019-12-11 DIAGNOSIS — D49511 Neoplasm of unspecified behavior of right kidney: Secondary | ICD-10-CM | POA: Diagnosis present

## 2019-12-11 DIAGNOSIS — Z87891 Personal history of nicotine dependence: Secondary | ICD-10-CM

## 2019-12-11 DIAGNOSIS — N049 Nephrotic syndrome with unspecified morphologic changes: Secondary | ICD-10-CM

## 2019-12-11 DIAGNOSIS — Z833 Family history of diabetes mellitus: Secondary | ICD-10-CM

## 2019-12-11 DIAGNOSIS — B9620 Unspecified Escherichia coli [E. coli] as the cause of diseases classified elsewhere: Secondary | ICD-10-CM | POA: Diagnosis present

## 2019-12-11 DIAGNOSIS — R109 Unspecified abdominal pain: Secondary | ICD-10-CM | POA: Diagnosis not present

## 2019-12-11 DIAGNOSIS — Z888 Allergy status to other drugs, medicaments and biological substances status: Secondary | ICD-10-CM

## 2019-12-11 DIAGNOSIS — Z960 Presence of urogenital implants: Secondary | ICD-10-CM | POA: Diagnosis present

## 2019-12-11 LAB — PROTEIN ELECTROPHORESIS, SERUM
A/G Ratio: 0.8 (ref 0.7–1.7)
Albumin ELP: 3.2 g/dL (ref 2.9–4.4)
Alpha-1-Globulin: 0.4 g/dL (ref 0.0–0.4)
Alpha-2-Globulin: 1 g/dL (ref 0.4–1.0)
Beta Globulin: 2.4 g/dL — ABNORMAL HIGH (ref 0.7–1.3)
Gamma Globulin: 0.5 g/dL (ref 0.4–1.8)
Globulin, Total: 4.2 g/dL — ABNORMAL HIGH (ref 2.2–3.9)
M-Spike, %: 1.7 g/dL — ABNORMAL HIGH
Total Protein ELP: 7.4 g/dL (ref 6.0–8.5)

## 2019-12-11 LAB — CEA: CEA: 3 ng/mL (ref 0.0–4.7)

## 2019-12-11 LAB — KAPPA/LAMBDA LIGHT CHAINS
Kappa free light chain: 5252.3 mg/L — ABNORMAL HIGH (ref 3.3–19.4)
Kappa, lambda light chain ratio: 352.5 — ABNORMAL HIGH (ref 0.26–1.65)
Lambda free light chains: 14.9 mg/L (ref 5.7–26.3)

## 2019-12-11 LAB — IGG, IGA, IGM
IgA: 1730 mg/dL — ABNORMAL HIGH (ref 64–422)
IgG (Immunoglobin G), Serum: 541 mg/dL — ABNORMAL LOW (ref 586–1602)
IgM (Immunoglobulin M), Srm: 33 mg/dL (ref 26–217)

## 2019-12-11 NOTE — Telephone Encounter (Signed)
Called pt's granddaughter to let her know that biopsies are scheduled for 2/17 with arrival time of 7AM. No answer so message left.

## 2019-12-11 NOTE — ED Triage Notes (Signed)
Per ems was called out due to a fall caused from weakness that has been progressively going on and seeing pcp for. Pt hit her head on the wall on the left side and left third toes. Pt did not have LOC nor is on blood thinners. Pt has hx of dementia and is at baseline.

## 2019-12-12 ENCOUNTER — Emergency Department (HOSPITAL_COMMUNITY): Payer: Medicare Other

## 2019-12-12 ENCOUNTER — Inpatient Hospital Stay (HOSPITAL_COMMUNITY): Payer: Medicare Other

## 2019-12-12 ENCOUNTER — Other Ambulatory Visit: Payer: Self-pay

## 2019-12-12 DIAGNOSIS — K219 Gastro-esophageal reflux disease without esophagitis: Secondary | ICD-10-CM | POA: Diagnosis present

## 2019-12-12 DIAGNOSIS — S299XXA Unspecified injury of thorax, initial encounter: Secondary | ICD-10-CM | POA: Diagnosis not present

## 2019-12-12 DIAGNOSIS — M79672 Pain in left foot: Secondary | ICD-10-CM | POA: Diagnosis not present

## 2019-12-12 DIAGNOSIS — C8590 Non-Hodgkin lymphoma, unspecified, unspecified site: Secondary | ICD-10-CM | POA: Diagnosis not present

## 2019-12-12 DIAGNOSIS — B9620 Unspecified Escherichia coli [E. coli] as the cause of diseases classified elsewhere: Secondary | ICD-10-CM | POA: Diagnosis present

## 2019-12-12 DIAGNOSIS — N1339 Other hydronephrosis: Secondary | ICD-10-CM | POA: Diagnosis present

## 2019-12-12 DIAGNOSIS — E872 Acidosis: Secondary | ICD-10-CM | POA: Diagnosis present

## 2019-12-12 DIAGNOSIS — Z66 Do not resuscitate: Secondary | ICD-10-CM | POA: Diagnosis present

## 2019-12-12 DIAGNOSIS — K2970 Gastritis, unspecified, without bleeding: Secondary | ICD-10-CM | POA: Diagnosis present

## 2019-12-12 DIAGNOSIS — R1900 Intra-abdominal and pelvic swelling, mass and lump, unspecified site: Secondary | ICD-10-CM | POA: Diagnosis not present

## 2019-12-12 DIAGNOSIS — C9000 Multiple myeloma not having achieved remission: Secondary | ICD-10-CM | POA: Diagnosis present

## 2019-12-12 DIAGNOSIS — Z8589 Personal history of malignant neoplasm of other organs and systems: Secondary | ICD-10-CM | POA: Diagnosis not present

## 2019-12-12 DIAGNOSIS — S99922A Unspecified injury of left foot, initial encounter: Secondary | ICD-10-CM | POA: Diagnosis not present

## 2019-12-12 DIAGNOSIS — Z936 Other artificial openings of urinary tract status: Secondary | ICD-10-CM | POA: Diagnosis not present

## 2019-12-12 DIAGNOSIS — S91115A Laceration without foreign body of left lesser toe(s) without damage to nail, initial encounter: Secondary | ICD-10-CM | POA: Diagnosis present

## 2019-12-12 DIAGNOSIS — R531 Weakness: Secondary | ICD-10-CM | POA: Diagnosis present

## 2019-12-12 DIAGNOSIS — N3000 Acute cystitis without hematuria: Secondary | ICD-10-CM | POA: Diagnosis not present

## 2019-12-12 DIAGNOSIS — M25562 Pain in left knee: Secondary | ICD-10-CM | POA: Diagnosis not present

## 2019-12-12 DIAGNOSIS — R609 Edema, unspecified: Secondary | ICD-10-CM | POA: Diagnosis not present

## 2019-12-12 DIAGNOSIS — N184 Chronic kidney disease, stage 4 (severe): Secondary | ICD-10-CM | POA: Diagnosis present

## 2019-12-12 DIAGNOSIS — N390 Urinary tract infection, site not specified: Secondary | ICD-10-CM | POA: Diagnosis present

## 2019-12-12 DIAGNOSIS — W19XXXA Unspecified fall, initial encounter: Secondary | ICD-10-CM | POA: Diagnosis present

## 2019-12-12 DIAGNOSIS — D696 Thrombocytopenia, unspecified: Secondary | ICD-10-CM | POA: Diagnosis not present

## 2019-12-12 DIAGNOSIS — I10 Essential (primary) hypertension: Secondary | ICD-10-CM | POA: Diagnosis present

## 2019-12-12 DIAGNOSIS — N1330 Unspecified hydronephrosis: Secondary | ICD-10-CM | POA: Diagnosis not present

## 2019-12-12 DIAGNOSIS — N189 Chronic kidney disease, unspecified: Secondary | ICD-10-CM | POA: Diagnosis not present

## 2019-12-12 DIAGNOSIS — G3184 Mild cognitive impairment, so stated: Secondary | ICD-10-CM | POA: Diagnosis present

## 2019-12-12 DIAGNOSIS — Z20822 Contact with and (suspected) exposure to covid-19: Secondary | ICD-10-CM | POA: Diagnosis present

## 2019-12-12 DIAGNOSIS — K689 Other disorders of retroperitoneum: Secondary | ICD-10-CM | POA: Diagnosis not present

## 2019-12-12 DIAGNOSIS — C480 Malignant neoplasm of retroperitoneum: Secondary | ICD-10-CM | POA: Diagnosis present

## 2019-12-12 DIAGNOSIS — Z87891 Personal history of nicotine dependence: Secondary | ICD-10-CM | POA: Diagnosis not present

## 2019-12-12 DIAGNOSIS — R0602 Shortness of breath: Secondary | ICD-10-CM | POA: Diagnosis not present

## 2019-12-12 DIAGNOSIS — J90 Pleural effusion, not elsewhere classified: Secondary | ICD-10-CM | POA: Diagnosis not present

## 2019-12-12 DIAGNOSIS — R1909 Other intra-abdominal and pelvic swelling, mass and lump: Secondary | ICD-10-CM | POA: Diagnosis not present

## 2019-12-12 DIAGNOSIS — R079 Chest pain, unspecified: Secondary | ICD-10-CM | POA: Diagnosis not present

## 2019-12-12 DIAGNOSIS — S0990XA Unspecified injury of head, initial encounter: Secondary | ICD-10-CM | POA: Diagnosis not present

## 2019-12-12 DIAGNOSIS — Z8051 Family history of malignant neoplasm of kidney: Secondary | ICD-10-CM | POA: Diagnosis not present

## 2019-12-12 DIAGNOSIS — N179 Acute kidney failure, unspecified: Secondary | ICD-10-CM | POA: Diagnosis present

## 2019-12-12 DIAGNOSIS — N139 Obstructive and reflux uropathy, unspecified: Secondary | ICD-10-CM | POA: Diagnosis present

## 2019-12-12 DIAGNOSIS — N281 Cyst of kidney, acquired: Secondary | ICD-10-CM | POA: Diagnosis not present

## 2019-12-12 DIAGNOSIS — M4856XD Collapsed vertebra, not elsewhere classified, lumbar region, subsequent encounter for fracture with routine healing: Secondary | ICD-10-CM | POA: Diagnosis present

## 2019-12-12 DIAGNOSIS — Z888 Allergy status to other drugs, medicaments and biological substances status: Secondary | ICD-10-CM | POA: Diagnosis not present

## 2019-12-12 DIAGNOSIS — E039 Hypothyroidism, unspecified: Secondary | ICD-10-CM | POA: Diagnosis present

## 2019-12-12 DIAGNOSIS — N136 Pyonephrosis: Secondary | ICD-10-CM | POA: Diagnosis present

## 2019-12-12 DIAGNOSIS — K5732 Diverticulitis of large intestine without perforation or abscess without bleeding: Secondary | ICD-10-CM | POA: Diagnosis present

## 2019-12-12 DIAGNOSIS — R5381 Other malaise: Secondary | ICD-10-CM | POA: Diagnosis present

## 2019-12-12 DIAGNOSIS — D808 Other immunodeficiencies with predominantly antibody defects: Secondary | ICD-10-CM | POA: Diagnosis present

## 2019-12-12 DIAGNOSIS — J910 Malignant pleural effusion: Secondary | ICD-10-CM | POA: Diagnosis present

## 2019-12-12 DIAGNOSIS — S91312A Laceration without foreign body, left foot, initial encounter: Secondary | ICD-10-CM | POA: Diagnosis not present

## 2019-12-12 DIAGNOSIS — E8770 Fluid overload, unspecified: Secondary | ICD-10-CM | POA: Diagnosis present

## 2019-12-12 DIAGNOSIS — B952 Enterococcus as the cause of diseases classified elsewhere: Secondary | ICD-10-CM | POA: Diagnosis present

## 2019-12-12 DIAGNOSIS — Z833 Family history of diabetes mellitus: Secondary | ICD-10-CM | POA: Diagnosis not present

## 2019-12-12 DIAGNOSIS — N138 Other obstructive and reflux uropathy: Secondary | ICD-10-CM | POA: Diagnosis present

## 2019-12-12 DIAGNOSIS — Z960 Presence of urogenital implants: Secondary | ICD-10-CM | POA: Diagnosis present

## 2019-12-12 DIAGNOSIS — D49511 Neoplasm of unspecified behavior of right kidney: Secondary | ICD-10-CM | POA: Diagnosis present

## 2019-12-12 DIAGNOSIS — E785 Hyperlipidemia, unspecified: Secondary | ICD-10-CM | POA: Diagnosis present

## 2019-12-12 DIAGNOSIS — Z515 Encounter for palliative care: Secondary | ICD-10-CM | POA: Diagnosis not present

## 2019-12-12 DIAGNOSIS — Z5111 Encounter for antineoplastic chemotherapy: Secondary | ICD-10-CM | POA: Diagnosis present

## 2019-12-12 DIAGNOSIS — C384 Malignant neoplasm of pleura: Secondary | ICD-10-CM | POA: Diagnosis not present

## 2019-12-12 LAB — CBC WITH DIFFERENTIAL/PLATELET
Abs Immature Granulocytes: 0.04 10*3/uL (ref 0.00–0.07)
Basophils Absolute: 0 10*3/uL (ref 0.0–0.1)
Basophils Relative: 0 %
Eosinophils Absolute: 0.1 10*3/uL (ref 0.0–0.5)
Eosinophils Relative: 1 %
HCT: 42 % (ref 36.0–46.0)
Hemoglobin: 13.1 g/dL (ref 12.0–15.0)
Immature Granulocytes: 1 %
Lymphocytes Relative: 17 %
Lymphs Abs: 0.9 10*3/uL (ref 0.7–4.0)
MCH: 30.8 pg (ref 26.0–34.0)
MCHC: 31.2 g/dL (ref 30.0–36.0)
MCV: 98.8 fL (ref 80.0–100.0)
Monocytes Absolute: 0.5 10*3/uL (ref 0.1–1.0)
Monocytes Relative: 8 %
Neutro Abs: 3.9 10*3/uL (ref 1.7–7.7)
Neutrophils Relative %: 73 %
Platelets: 170 10*3/uL (ref 150–400)
RBC: 4.25 MIL/uL (ref 3.87–5.11)
RDW: 12.3 % (ref 11.5–15.5)
WBC: 5.4 10*3/uL (ref 4.0–10.5)
nRBC: 0 % (ref 0.0–0.2)

## 2019-12-12 LAB — SARS CORONAVIRUS 2 (TAT 6-24 HRS): SARS Coronavirus 2: NEGATIVE

## 2019-12-12 LAB — COMPREHENSIVE METABOLIC PANEL
ALT: 10 U/L (ref 0–44)
AST: 18 U/L (ref 15–41)
Albumin: 2.8 g/dL — ABNORMAL LOW (ref 3.5–5.0)
Alkaline Phosphatase: 49 U/L (ref 38–126)
Anion gap: 14 (ref 5–15)
BUN: 32 mg/dL — ABNORMAL HIGH (ref 8–23)
CO2: 16 mmol/L — ABNORMAL LOW (ref 22–32)
Calcium: 9.4 mg/dL (ref 8.9–10.3)
Chloride: 113 mmol/L — ABNORMAL HIGH (ref 98–111)
Creatinine, Ser: 2.91 mg/dL — ABNORMAL HIGH (ref 0.44–1.00)
GFR calc Af Amer: 17 mL/min — ABNORMAL LOW (ref >60)
GFR calc non Af Amer: 15 mL/min — ABNORMAL LOW (ref >60)
Glucose, Bld: 93 mg/dL (ref 70–99)
Potassium: 4.7 mmol/L (ref 3.5–5.1)
Sodium: 143 mmol/L (ref 135–145)
Total Bilirubin: 0.5 mg/dL (ref 0.3–1.2)
Total Protein: 7.9 g/dL (ref 6.5–8.1)

## 2019-12-12 LAB — BODY FLUID CELL COUNT WITH DIFFERENTIAL
Lymphs, Fluid: 6 %
Monocyte-Macrophage-Serous Fluid: 10 % — ABNORMAL LOW (ref 50–90)
Neutrophil Count, Fluid: 1 % (ref 0–25)
Other Cells, Fluid: 84 %
Total Nucleated Cell Count, Fluid: 5280 cu mm — ABNORMAL HIGH (ref 0–1000)

## 2019-12-12 LAB — URINALYSIS, ROUTINE W REFLEX MICROSCOPIC
Bacteria, UA: NONE SEEN
Bilirubin Urine: NEGATIVE
Glucose, UA: NEGATIVE mg/dL
Ketones, ur: NEGATIVE mg/dL
Nitrite: NEGATIVE
Protein, ur: 30 mg/dL — AB
RBC / HPF: >50 RBC/hpf — ABNORMAL HIGH (ref 0–5)
Specific Gravity, Urine: 1.016 (ref 1.005–1.030)
pH: 6 (ref 5.0–8.0)

## 2019-12-12 LAB — BRAIN NATRIURETIC PEPTIDE: B Natriuretic Peptide: 157.3 pg/mL — ABNORMAL HIGH (ref 0.0–100.0)

## 2019-12-12 LAB — TROPONIN I (HIGH SENSITIVITY)
Troponin I (High Sensitivity): 10 ng/L (ref <18)
Troponin I (High Sensitivity): 15 ng/L (ref <18)

## 2019-12-12 MED ORDER — LIDOCAINE HCL (PF) 1 % IJ SOLN
5.0000 mL | Freq: Once | INTRAMUSCULAR | Status: AC
  Administered 2019-12-12: 5 mL via INTRADERMAL
  Filled 2019-12-12: qty 5

## 2019-12-12 MED ORDER — TRAMADOL HCL 50 MG PO TABS
50.0000 mg | ORAL_TABLET | Freq: Four times a day (QID) | ORAL | Status: DC | PRN
  Administered 2019-12-12 – 2019-12-15 (×5): 50 mg via ORAL
  Filled 2019-12-12 (×5): qty 1

## 2019-12-12 MED ORDER — SODIUM CHLORIDE 0.9 % IV SOLN
1.0000 g | INTRAVENOUS | Status: DC
  Administered 2019-12-12 – 2019-12-14 (×2): 1 g via INTRAVENOUS
  Filled 2019-12-12 (×2): qty 1

## 2019-12-12 MED ORDER — PANTOPRAZOLE SODIUM 40 MG PO TBEC
40.0000 mg | DELAYED_RELEASE_TABLET | Freq: Two times a day (BID) | ORAL | Status: DC
  Administered 2019-12-12 – 2019-12-15 (×7): 40 mg via ORAL
  Filled 2019-12-12 (×7): qty 1

## 2019-12-12 MED ORDER — ATORVASTATIN CALCIUM 10 MG PO TABS
10.0000 mg | ORAL_TABLET | Freq: Every day | ORAL | Status: DC
  Administered 2019-12-12 – 2019-12-15 (×4): 10 mg via ORAL
  Filled 2019-12-12 (×4): qty 1

## 2019-12-12 MED ORDER — ASPIRIN EC 81 MG PO TBEC
81.0000 mg | DELAYED_RELEASE_TABLET | Freq: Every day | ORAL | Status: DC
  Administered 2019-12-12 – 2019-12-15 (×4): 81 mg via ORAL
  Filled 2019-12-12 (×4): qty 1

## 2019-12-12 MED ORDER — LIDOCAINE HCL (PF) 1 % IJ SOLN
INTRAMUSCULAR | Status: AC
  Filled 2019-12-12: qty 30

## 2019-12-12 MED ORDER — SODIUM CHLORIDE 0.9 % IV SOLN
1.0000 g | Freq: Once | INTRAVENOUS | Status: AC
  Administered 2019-12-12: 1 g via INTRAVENOUS
  Filled 2019-12-12: qty 10

## 2019-12-12 MED ORDER — ACETAMINOPHEN 325 MG PO TABS
650.0000 mg | ORAL_TABLET | Freq: Four times a day (QID) | ORAL | Status: DC | PRN
  Administered 2019-12-14 (×2): 650 mg via ORAL
  Filled 2019-12-12 (×2): qty 2

## 2019-12-12 MED ORDER — ONDANSETRON HCL 4 MG PO TABS: 4.0000 mg | ORAL_TABLET | Freq: Four times a day (QID) | ORAL | Status: DC | PRN

## 2019-12-12 MED ORDER — DONEPEZIL HCL 5 MG PO TABS
5.0000 mg | ORAL_TABLET | Freq: Every day | ORAL | Status: DC
  Administered 2019-12-12 – 2019-12-14 (×3): 5 mg via ORAL
  Filled 2019-12-12 (×3): qty 1

## 2019-12-12 MED ORDER — ACETAMINOPHEN 650 MG RE SUPP: 650.0000 mg | Freq: Four times a day (QID) | RECTAL | Status: DC | PRN

## 2019-12-12 MED ORDER — TECHNETIUM TC 99M MERTIATIDE: 15.0000 | Freq: Once | INTRAVENOUS | Status: DC | PRN

## 2019-12-12 MED ORDER — HEPARIN SODIUM (PORCINE) 5000 UNIT/ML IJ SOLN: 5000.0000 [IU] | Freq: Three times a day (TID) | INTRAMUSCULAR | Status: DC

## 2019-12-12 MED ORDER — SODIUM BICARBONATE-DEXTROSE 150-5 MEQ/L-% IV SOLN
150.0000 meq | INTRAVENOUS | Status: DC
  Administered 2019-12-12 – 2019-12-13 (×2): 150 meq via INTRAVENOUS
  Filled 2019-12-12 (×2): qty 1000

## 2019-12-12 MED ORDER — ONDANSETRON HCL 4 MG/2ML IJ SOLN: 4.0000 mg | Freq: Four times a day (QID) | INTRAMUSCULAR | Status: DC | PRN

## 2019-12-12 MED ORDER — LEVOTHYROXINE SODIUM 75 MCG PO TABS
75.0000 ug | ORAL_TABLET | Freq: Every day | ORAL | Status: DC
  Administered 2019-12-12 – 2019-12-15 (×4): 75 ug via ORAL
  Filled 2019-12-12 (×4): qty 1

## 2019-12-12 MED ORDER — TRAMADOL HCL 50 MG PO TABS
50.0000 mg | ORAL_TABLET | Freq: Once | ORAL | Status: AC
  Administered 2019-12-12: 50 mg via ORAL
  Filled 2019-12-12: qty 1

## 2019-12-12 NOTE — Plan of Care (Addendum)
EDP spoke with Dr. Louis Meckel: 1) keep patient here at [facility] 2) he will see patient later today in consult 3) will keep NPO until he can evaluate 4) Delay DVT chemo ppx until 1pm so he can have chance to evaluate

## 2019-12-12 NOTE — H&P (Addendum)
History and Physical    Regina Harris:[id number] DOB: 1936-08-14 DOA: 12/11/2019  PCP: Abner Greenspan, MD  Patient coming from: Home  I have personally briefly reviewed patient's old medical records in [place]  Chief Complaint: Generalized weakness, fall  HPI: Regina Harris is a 84 y.o. female with medical history significant of HTN.  Patient diagnosed with retroperitoneal malignant neoplasm of some sort (still not sure exact cell time, most recently thinking some sort of myeloma perhaps), last month when she presented to the hospital on 1/13 with LLQ abd pain.  She was found to have mild diverticulitis, but much more impressive was the AKI with creat of 1.7 and L hydronephrosis secondary to retro peritoneal mass.  On 1/14 she underwent cystoscopy with L ureteral stent placement.  Creat would then normalize and by 1/15 it was down to 1.0.  On 1/22 patient would have UTI, urine ultimately growing E.Coli, treated with bactrim as outpt.  Due to feeling worse and declining performance status, pt seen in office on 2/4.  Creat 2.0 then.  Renal US ordered on 2/5 which demonstrates B hydro nephrosis R>L.  Patient in to ED later that evening.   ED Course: Creat 2.9 this morning.  UA with mod LE, 11-20 WBC, > 50 RBC, WBC only 5.4, no fever.  Mild tachy to 102.  On ROS: legs not painful right now, but does get intermittent soreness in both calfs.  Leg swelling is significantly worsened over past week or two.   Review of Systems: As per HPI, otherwise all review of systems negative.  Past Medical History:  Diagnosis Date   Barrett esophagus    Carotid bruit    Diabetes mellitus without complication ([place])    Esophagitis    Gastritis 2013   GERD (gastroesophageal reflux disease)    HH (hiatus hernia)    History of colonic polyps    History of repair of right rotator cuff    Hyperlipidemia    Hypertension    Hyperthyroidism 06/09/2018   Per [facility] Therapy for  Hyperthyroidism order   Lump or mass in breast    Pneumonia    Polycythemia, secondary    Tobacco abuse     Past Surgical History:  Procedure Laterality Date   ABDOMINAL HYSTERECTOMY     APPENDECTOMY     BREAST BIOPSY Right 1996   BREAST BIOPSY Left 10/09/2012   Benign breast tissue with focal fat necrosis and focal periductal chronic inflammation.   BREAST BIOPSY Left 10/09/2012   Benign breast tissue with focal fat necrosis and focal periductal chronic inflammation.   CATARACT EXTRACTION  3/11   Dr Charise Killian   CHOLECYSTECTOMY     COLONOSCOPY  2009   CYSTOSCOPY WITH URETEROSCOPY AND STENT PLACEMENT Left 11/19/2019   Procedure: CYSTOSCOPY WITH URETEROSCOPY AND STENT PLACEMENT;  Surgeon: Billey Co, MD;  Location: [facility] ORS;  Service: Urology;  Laterality: Left;   ESOPHAGOGASTRODUODENOSCOPY (EGD) WITH PROPOFOL N/A 11/02/2015   Procedure: ESOPHAGOGASTRODUODENOSCOPY (EGD) WITH PROPOFOL;  Surgeon: Manya Silvas, MD;  Location: [facility] ENDOSCOPY;  Service: Endoscopy;  Laterality: N/A;   SHOULDER ARTHROSCOPY WITH OPEN ROTATOR CUFF REPAIR Right 03/05/2016   Procedure: SHOULDER ARTHROSCOPY WITH OPEN ROTATOR CUFF REPAIR;  Surgeon: Earnestine Leys, MD;  Location: [facility] ORS;  Service: Orthopedics;  Laterality: Right;   UPPER GI ENDOSCOPY  2013     reports that she has quit smoking. Her smoking use included cigarettes. She smoked 1.00 pack per day. She has  never used smokeless tobacco. She reports that she does not drink alcohol or use drugs.  Allergies  Allergen Reactions   Benadryl [Diphenhydramine] Other (See Comments)    Hallucinations; family request to not give this medication.    Family History  Problem Relation Age of Onset   Kidney cancer Father    Diabetes Mother    Breast cancer Neg Hx      Prior to Admission medications   Medication Sig Start Date End Date Taking? Authorizing Provider  acetaminophen (TYLENOL) 500 MG tablet Take 2 tablets (1,000 mg total)  by mouth 3 (three) times daily. 11/20/19  Yes Danford, Suann Larry, MD  aspirin EC 81 MG tablet Take 81 mg by mouth daily.   Yes [provider]  atorvastatin (LIPITOR) 10 MG tablet TAKE 1 TABLET BY MOUTH ONCE DAILY Patient taking differently: Take 10 mg by mouth daily.  08/04/19  Yes Tower, Wynelle Fanny, MD  Calcium Carb-Cholecalciferol (CALCIUM 1000 + D) 1000-800 MG-UNIT TABS Take 1 tablet by mouth 2 (two) times daily.    Yes [provider]  cyanocobalamin 1000 MCG tablet Take 1,000 mcg by mouth daily.   Yes [provider]  donepezil (ARICEPT) 5 MG tablet TAKE 1 TABLET BY MOUTH AT BEDTIME Patient taking differently: Take 5 mg by mouth at bedtime.  11/30/19  Yes Tower, Wynelle Fanny, MD  furosemide (LASIX) 20 MG tablet TAKE 1 TABLET BY MOUTH ONCE A DAY Patient taking differently: Take 20 mg by mouth daily.  07/22/19  Yes Tower, Wynelle Fanny, MD  gabapentin (NEURONTIN) 400 MG capsule TAKE 1 CAPSULE BY MOUTH TWICE DAILY Patient taking differently: Take 400 mg by mouth 2 (two) times daily.  08/04/19  Yes Tower, Wynelle Fanny, MD  levothyroxine (SYNTHROID) 75 MCG tablet Take 75 mcg by mouth daily with breakfast.  07/21/19  Yes [provider]  megestrol (MEGACE) 40 MG tablet Take 1 tablet (40 mg total) by mouth daily. 11/27/19  Yes Lloyd Huger, MD  omeprazole (PRILOSEC) 20 MG capsule TAKE 1 CAPSULE BY MOUTH TWICE DAILY Patient taking differently: Take 20 mg by mouth 2 (two) times daily.  07/22/19  Yes Tower, Wynelle Fanny, MD  potassium chloride SA (K-DUR) 20 MEQ tablet TAKE 1 TABLET BY MOUTH TWICE (2) DAILY Patient taking differently: Take 20 mEq by mouth 2 (two) times daily.  08/04/19  Yes Tower, Wynelle Fanny, MD  traMADol (ULTRAM) 50 MG tablet Take 1 tablet (50 mg total) by mouth every 6 (six) hours as needed. Patient taking differently: Take 50 mg by mouth every 6 (six) hours as needed for moderate pain.  12/10/19  Yes Lloyd Huger, MD  zinc sulfate 220 (50 Zn) MG capsule Take 1  capsule (220 mg total) by mouth daily. 08/07/19  Yes Gherghe, Vella Redhead, MD  potassium chloride (K-DUR) 10 MEQ tablet Take 1 tablet (10 mEq total) by mouth daily. 01/04/14 05/02/14  Abner Greenspan, MD    Physical Exam: Vitals:   12/12/19 0130 12/12/19 0241 12/12/19 0315 12/12/19 0400  BP: (!) 161/89 (!) 151/92 (!) 150/99 134/73  Pulse: (!) 101 (!) 103 (!) 108 94  Resp: 16 18 (!) 21 17  Temp:      TempSrc:      SpO2: 98% 100% 97% 94%    Constitutional: NAD, calm, comfortable Eyes: PERRL, lids and conjunctivae normal ENMT: Mucous membranes are moist. Posterior pharynx clear of any exudate or lesions.Normal dentition.  Neck: normal, supple, no masses, no thyromegaly Respiratory: clear to  auscultation bilaterally, no wheezing, no crackles. Normal respiratory effort. No accessory muscle use.  Cardiovascular: Regular rate and rhythm, no murmurs / rubs / gallops. 2+ BLE edema. 2+ pedal pulses. No carotid bruits.  Abdomen: no tenderness, no masses palpated. No hepatosplenomegaly. Bowel sounds positive.  Musculoskeletal: no clubbing / cyanosis. No joint deformity upper and lower extremities. Good ROM, no contractures. Normal muscle tone.  Skin: no rashes, lesions, ulcers. No induration Neurologic: CN 2-12 grossly intact. Sensation intact, DTR normal. Strength 5/5 in all 4.  Psychiatric: Normal judgment and insight. Alert and oriented x 3. Normal mood.    Labs on Admission: I have personally reviewed following labs and imaging studies  CBC: Recent Labs  Lab 12/10/19 1422 12/12/19 0048  WBC 4.4 5.4  NEUTROABS 2.8 3.9  HGB 11.8* 13.1  HCT 40.1 42.0  MCV 103.9* 98.8  PLT 168 485   Basic Metabolic Panel: Recent Labs  Lab 12/10/19 1422 12/12/19 0048  NA 142 143  K 4.4 4.7  CL 113* 113*  CO2 18* 16*  GLUCOSE 101* 93  BUN 30* 32*  CREATININE 2.00* 2.91*  CALCIUM 9.0 9.4   GFR: Estimated Creatinine Clearance: 18 mL/min (A) (by C-G formula based on SCr of 2.91 mg/dL (H)). Liver  Function Tests: Recent Labs  Lab 12/12/19 0048  AST 18  ALT 10  ALKPHOS 49  BILITOT 0.5  PROT 7.9  ALBUMIN 2.8*   No results for input(s): LIPASE, AMYLASE in the last 168 hours. No results for input(s): AMMONIA in the last 168 hours. Coagulation Profile: No results for input(s): INR, PROTIME in the last 168 hours. Cardiac Enzymes: No results for input(s): CKTOTAL, CKMB, CKMBINDEX, TROPONINI in the last 168 hours. BNP (last 3 results) No results for input(s): PROBNP in the last 8760 hours. HbA1C: No results for input(s): HGBA1C in the last 72 hours. CBG: Recent Labs  Lab 12/07/19 1133  GLUCAP 81   Lipid Profile: No results for input(s): CHOL, HDL, LDLCALC, TRIG, CHOLHDL, LDLDIRECT in the last 72 hours. Thyroid Function Tests: No results for input(s): TSH, T4TOTAL, FREET4, T3FREE, THYROIDAB in the last 72 hours. Anemia Panel: No results for input(s): VITAMINB12, FOLATE, FERRITIN, TIBC, IRON, RETICCTPCT in the last 72 hours. Urine analysis:    Component Value Date/Time   COLORURINE YELLOW 12/12/2019 0201   APPEARANCEUR HAZY (A) 12/12/2019 0201   LABSPEC 1.016 12/12/2019 0201   PHURINE 6.0 12/12/2019 0201   GLUCOSEU NEGATIVE 12/12/2019 0201   HGBUR LARGE (A) 12/12/2019 0201   BILIRUBINUR NEGATIVE 12/12/2019 0201   BILIRUBINUR Negative 11/10/2019 1643   KETONESUR NEGATIVE 12/12/2019 0201   PROTEINUR 30 (A) 12/12/2019 0201   UROBILINOGEN 0.2 11/10/2019 1643   NITRITE NEGATIVE 12/12/2019 0201   LEUKOCYTESUR MODERATE (A) 12/12/2019 0201    Radiological Exams on Admission: DG Knee 2 Views Left  Result Date: 12/12/2019 CLINICAL DATA:  Knee pain EXAM: LEFT KNEE - 1-2 VIEW COMPARISON:  None. FINDINGS: No fracture or dislocation. Tricompartmental osteoarthritis is noted with joint space loss and subchondral sclerosis. No large knee joint effusion. Patellar enthesopathy seen. Vascular calcifications are noted. IMPRESSION: No acute osseous abnormality. Electronically Signed    By: Prudencio Pair M.D.   On: 12/12/2019 03:35   DG Abd 1 View  Result Date: 12/11/2019 CLINICAL DATA:  Ureteral stent. Lower abdominal pain. EXAM: ABDOMEN - 1 VIEW COMPARISON:  November 17, 2019. FINDINGS: The bowel gas pattern is normal. Status post cholecystectomy. Left ureteral stent is noted in grossly good position. Phleboliths are noted in  the pelvis. No nephrolithiasis is noted. IMPRESSION: Left ureteral stent is noted in grossly good position. No evidence of bowel obstruction or ileus. Electronically Signed   By: Marijo Conception M.D.   On: 12/11/2019 16:30   CT Head Wo Contrast  Result Date: 12/12/2019 CLINICAL DATA:  Fall weakness EXAM: CT HEAD WITHOUT CONTRAST TECHNIQUE: Contiguous axial images were obtained from the base of the skull through the vertex without intravenous contrast. COMPARISON:  December 07, 2019 brain MRI FINDINGS: Brain: No evidence of acute territorial infarction, hemorrhage, hydrocephalus,extra-axial collection or mass lesion/mass effect. There is dilatation the ventricles and sulci consistent with age-related atrophy. Low-attenuation changes in the deep white matter consistent with small vessel ischemia. Vascular: No hyperdense vessel or unexpected calcification. Skull: The skull is intact. No fracture or focal lesion identified. Sinuses/Orbits: The visualized paranasal sinuses and mastoid air cells are clear. The orbits and globes intact. Other: Small laceration seen over the posterior left parietal skull. IMPRESSION: No acute intracranial abnormality. Findings consistent with age related atrophy and chronic small vessel ischemia Electronically Signed   By: Prudencio Pair M.D.   On: 12/12/2019 00:45   US RENAL  Result Date: 12/11/2019 CLINICAL DATA:  Retroperitoneal mass on CT, increasing creatinine EXAM: RENAL / URINARY TRACT ULTRASOUND COMPLETE COMPARISON:  CT abdomen and pelvis 11/18/2019 FINDINGS: Right Kidney: Renal measurements: 11.9 x 7.9 x 7.9 cm = volume: 788 mL.  Cortical thinning. Normal cortical echogenicity. Mild-to-moderate RIGHT hydronephrosis. Cystic lesion at upper pole 7.2 x 5.9 x 5.7 cm, simple features. Additional peripelvic/mid RIGHT renal cyst 5.7 x 4.3 x 4.0 cm. No solid masses or shadowing calculi. Left Kidney: Renal measurements: 12.1 x 6.3 x 6.9 cm = volume: 273 mL. Cortical thinning. Mild hydronephrosis. Minimally increased cortical echogenicity. Small mass at upper pole 2.8 x 3.2 x 2.3 cm, by prior CT exam appears represent a splenic lobulation rather than a renal lesion. Small exophytic cyst at inferior pole 2.1 x 1.9 x 1.6 cm. No additional renal mass. Bladder: Stent in urinary bladder.  Bladder poorly distended. Other: Small LEFT pleural effusion. IMPRESSION: BILATERAL hydronephrosis, slightly greater on RIGHT. BILATERAL renal cysts larger on RIGHT. Small LEFT pleural effusion. Electronically Signed   By: Lavonia Dana M.D.   On: 12/11/2019 14:48   DG Chest Port 1 View  Result Date: 12/12/2019 CLINICAL DATA:  Pain status post fall EXAM: PORTABLE CHEST 1 VIEW COMPARISON:  08/01/2019 FINDINGS: There is a new moderate to large left-sided pleural effusion. There is a small right-sided pleural effusion. The heart size is relatively normal. Aortic calcifications are noted. There is no pneumothorax. There is no definite acute displaced fracture however evaluation of the left-sided ribs is limited by the large pleural effusion and osteopenia. The patient's known posterior mediastinal mass is better appreciated on prior CT. IMPRESSION: Moderate to large left-sided pleural effusion, new since prior study. Electronically Signed   By: Constance Holster M.D.   On: 12/12/2019 01:52   DG Foot 2 Views Left  Result Date: 12/12/2019 CLINICAL DATA:  Pain status post fall EXAM: LEFT FOOT - 2 VIEW COMPARISON:  None. FINDINGS: There is no evidence of fracture or dislocation. There is no evidence of arthropathy or other focal bone abnormality. There is mild soft tissue  swelling about the dorsal aspect of the forefoot. Is a small plantar calcaneal spur. There are degenerative changes of the midfoot. IMPRESSION: No acute displaced fracture. Electronically Signed   By: Constance Holster M.D.   On: 12/12/2019 01:46  EKG: Independently reviewed.  Assessment/Plan Principal Problem:   Bilateral hydronephrosis Active Problems:   UTI (urinary tract infection)   AKI (acute kidney injury) ([place])   Retroperitoneal mass    1. AKI due to obstructive uropathy with B hydronephrosis - R>L 1. Had L ureteral stent placed on 1/14 2. EDP calling urology for consult 3. Will admit to whichever hospital they want me to admit to. 4. Hold home lasix 5. Hold neurontin as well for the moment 2. Possible UTI - 1. Empiric rocephin 2. UCx pending 3. No fever, no WBC 4. No hypotension 5. But concerning none the less as patient has B hydronephrosis 3. BLE edema - 1. Probably just due to AKI 2. But will check venous US to r/o DVT 4. Retroperitoneal mass - 1. Following with oncology  DVT prophylaxis: Heparin [place] Code Status: Full code for now Family Communication: Daughter at bedside Disposition Plan: Home after admit Consults called: Urology Admission status: Admit to inpatient  Severity of Illness: The appropriate patient status for this patient is INPATIENT. Inpatient status is judged to be reasonable and necessary in order to provide the required intensity of service to ensure the patient's safety. The patient's presenting symptoms, physical exam findings, and initial radiographic and laboratory data in the context of their chronic comorbidities is felt to place them at high risk for further clinical deterioration. Furthermore, it is not anticipated that the patient will be medically stable for discharge from the hospital within 2 midnights of admission. The following factors support the patient status of inpatient.   IP status due to AKI associated with B  hydronephrosis   * I certify that at the point of admission it is my clinical judgment that the patient will require inpatient hospital care spanning beyond 2 midnights from the point of admission due to high intensity of service, high risk for further deterioration and high frequency of surveillance required.*    Revecca Nachtigal M. DO [facility]  How to contact the [facility] Attending or Consulting provider [employer] or covering provider during after hours [place], for this patient?  1. Check the care team in [facility] and look for a) attending/consulting [facility] provider listed and b) the [facility] team listed 2. Log into [email or web address]  Amion Physician Scheduling and messaging for groups and whole hospitals  On call and physician scheduling software for group practices, residents, hospitalists and other medical providers for call, clinic, rotation and shift schedules. On[employer] is a hospital-wide system for scheduling doctors and paging doctors on call. EasyPlot is for scientific plotting and data analysis.  [email or web address]  and use [place]'s universal password to access. If you do not have the password, please contact the hospital operator.  3. Locate the [facility] provider you are looking for under [facility] and page to a number that you can be directly reached. 4. If you still have difficulty reaching the provider, please page the [facility] (Director on Call) for the Hospitalists listed on amion for assistance.  12/12/2019, 4:34 AM

## 2019-12-12 NOTE — ED Notes (Signed)
Called lab to add on urine culture to urine already in lab

## 2019-12-12 NOTE — Progress Notes (Signed)
New Admission Note: ? Arrival Method: Stretcher Mental Orientation: Alert and Oriented x4 Telemetry: Box-20 Assessment: Completed Skin: Refer to flowsheet IV: Right Hand Pain: 0/10 Tubes: None Safety Measures: Safety Fall Prevention Plan discussed with patient. Admission: Completed 5 [place] Orientation: Patient has been orientated to the room, unit and the staff. Family: None at the Bedside Orders have been reviewed and are being implemented. Will continue to monitor the patient. Call light has been placed within reach and bed alarm has been activated.  ? Milagros Loll, RN  Phone Number: [phone]

## 2019-12-12 NOTE — ED Provider Notes (Signed)
[facility] Provider Note  CSN: [id number] Arrival date & time: 12/11/19 2321  Chief Complaint(s) Fall and Weakness  HPI Regina Harris is a 84 y.o. female with a past medical history listed below.  Patient is currently being worked up for lymphoma/multiple myeloma.  Recent studies revealed retroperitoneal masses that was causing left-sided hydronephrosis requiring stenting.  Patient has been developing peripheral edema and volume overload over the past several weeks.  With that patient has been having increased fatigue and shortness of breath.  Regina Harris presents today for a fall due to her generalized fatigue resulting in head trauma and laceration to the left foot.  Patient is not anticoagulated.  Regina Harris is not currently undergoing any chemo therapy.  Patient was treated several weeks ago for urinary tract infection.  No other infections.  Patient has no acute complaints at this time.  HPI  Past Medical History Past Medical History:  Diagnosis Date  . Barrett esophagus   . Carotid bruit   . Diabetes mellitus without complication ([place])   . Esophagitis   . Gastritis 2013  . GERD (gastroesophageal reflux disease)   . HH (hiatus hernia)   . History of colonic polyps   . History of repair of right rotator cuff   . Hyperlipidemia   . Hypertension   . Hyperthyroidism 06/09/2018   Per [facility] RAI Therapy for Hyperthyroidism order  . Lump or mass in breast   . Pneumonia   . Polycythemia, secondary   . Tobacco abuse    Patient Active Problem List   Diagnosis Date Noted  . Bilateral hydronephrosis 12/12/2019  . Retroperitoneal mass 12/04/2019  . AKI (acute kidney injury) ([place]) 11/17/2019  . Acute metabolic encephalopathy 85/88/5027  . Acute lower UTI 07/29/2019  . Pneumonia due to COVID-19 virus 07/29/2019  . COVID-19 virus infection 07/29/2019  . UTI (urinary tract infection) 07/27/2019  . Nasal congestion 07/27/2019  . B12 deficiency 01/05/2019  . Memory loss 12/30/2018  .  Poor balance 12/30/2018  . Low back pain 10/14/2018  . Fatigue 10/01/2018  . Abnormal urinalysis 10/01/2018  . 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 Right leg swelling 06/09/2018  . Right knee pain 06/09/2018  . Baker's cyst of knee, right 06/09/2018  . Encounter for screening mammogram for breast cancer 02/27/2017  . Estrogen deficiency 02/27/2017  . Foot pain, bilateral 02/27/2017  . Hypothyroidism 04/16/2016  . Pedal edema 04/05/2016  . Right rotator cuff tear 03/09/2016  . Hypokalemia 03/01/2016  . Nodule of chest wall 10/06/2014  . Left breast mass 06/30/2014  . Encounter for Medicare annual wellness exam 02/22/2014  . Polycythemia, secondary 02/22/2014  . GERD (gastroesophageal reflux disease) 08/24/2013  . Lump or mass in breast 05/06/2013  . Post-menopausal 06/24/2012  . Obesity 12/24/2011  . Prediabetes 03/12/2011  . NEOPLASM UNSPECIFIED NATURE DIGESTIVE SYSTEM 07/24/2010  . LUNG NODULE 01/16/2010  . HYPERCHOLESTEROLEMIA, PURE 07/10/2007  . Former smoker 03/19/2007  . Essential hypertension 03/19/2007  . Carotid bruit 03/19/2007  . COLONIC POLYPS, HX OF 03/19/2007   Home Medication(s) Prior to Admission medications   Medication Sig Start Date End Date Taking? Authorizing Provider  acetaminophen (TYLENOL) 500 MG tablet Take 2 tablets (1,000 mg total) by mouth 3 (three) times daily. 11/20/19  Yes Danford, Suann Larry, MD  aspirin EC 81 MG tablet Take 81 mg by mouth daily.   Yes [provider]  atorvastatin (LIPITOR) 10 MG tablet TAKE 1 TABLET BY MOUTH ONCE DAILY Patient taking differently: Take 10 mg  by mouth daily.  08/04/19  Yes Tower, Wynelle Fanny, MD  Calcium Carb-Cholecalciferol (CALCIUM 1000 + D) 1000-800 MG-UNIT TABS Take 1 tablet by mouth 2 (two) times daily.    Yes [provider]  cyanocobalamin 1000 MCG tablet Take 1,000 mcg by mouth daily.   Yes [provider]  donepezil (ARICEPT) 5 MG tablet TAKE 1 TABLET BY MOUTH AT  BEDTIME Patient taking differently: Take 5 mg by mouth at bedtime.  11/30/19  Yes Tower, Wynelle Fanny, MD  furosemide (LASIX) 20 MG tablet TAKE 1 TABLET BY MOUTH ONCE A DAY Patient taking differently: Take 20 mg by mouth daily.  07/22/19  Yes Tower, Wynelle Fanny, MD  gabapentin (NEURONTIN) 400 MG capsule TAKE 1 CAPSULE BY MOUTH TWICE DAILY Patient taking differently: Take 400 mg by mouth 2 (two) times daily.  08/04/19  Yes Tower, Wynelle Fanny, MD  levothyroxine (SYNTHROID) 75 MCG tablet Take 75 mcg by mouth daily with breakfast.  07/21/19  Yes [provider]  megestrol (MEGACE) 40 MG tablet Take 1 tablet (40 mg total) by mouth daily. 11/27/19  Yes Lloyd Huger, MD  omeprazole (PRILOSEC) 20 MG capsule TAKE 1 CAPSULE BY MOUTH TWICE DAILY Patient taking differently: Take 20 mg by mouth 2 (two) times daily.  07/22/19  Yes Tower, Wynelle Fanny, MD  potassium chloride SA (K-DUR) 20 MEQ tablet TAKE 1 TABLET BY MOUTH TWICE (2) DAILY Patient taking differently: Take 20 mEq by mouth 2 (two) times daily.  08/04/19  Yes Tower, Wynelle Fanny, MD  traMADol (ULTRAM) 50 MG tablet Take 1 tablet (50 mg total) by mouth every 6 (six) hours as needed. Patient taking differently: Take 50 mg by mouth every 6 (six) hours as needed for moderate pain.  12/10/19  Yes Lloyd Huger, MD  zinc sulfate 220 (50 Zn) MG capsule Take 1 capsule (220 mg total) by mouth daily. 08/07/19  Yes Gherghe, Vella Redhead, MD  potassium chloride (K-DUR) 10 MEQ tablet Take 1 tablet (10 mEq total) by mouth daily. 01/04/14 05/02/14  Abner Greenspan, MD                                                                                                                                    Past Surgical History Past Surgical History:  Procedure Laterality Date  . ABDOMINAL HYSTERECTOMY    . APPENDECTOMY    . BREAST BIOPSY Right 1996  . BREAST BIOPSY Left 10/09/2012   Benign breast tissue with focal fat necrosis and focal periductal chronic inflammation.  Marland Kitchen BREAST BIOPSY  Left 10/09/2012   Benign breast tissue with focal fat necrosis and focal periductal chronic inflammation.  Marland Kitchen CATARACT EXTRACTION  3/11   Dr Charise Killian  . CHOLECYSTECTOMY    . COLONOSCOPY  2009  . CYSTOSCOPY WITH URETEROSCOPY AND STENT PLACEMENT Left 11/19/2019   Procedure: CYSTOSCOPY WITH URETEROSCOPY AND STENT PLACEMENT;  Surgeon: Billey Co, MD;  Location:  [facility] ORS;  Service: Urology;  Laterality: Left;  . ESOPHAGOGASTRODUODENOSCOPY (EGD) WITH PROPOFOL N/A 11/02/2015   Procedure: ESOPHAGOGASTRODUODENOSCOPY (EGD) WITH PROPOFOL;  Surgeon: Manya Silvas, MD;  Location: [facility] ENDOSCOPY;  Service: Endoscopy;  Laterality: N/A;  . SHOULDER ARTHROSCOPY WITH OPEN ROTATOR CUFF REPAIR Right 03/05/2016   Procedure: SHOULDER ARTHROSCOPY WITH OPEN ROTATOR CUFF REPAIR;  Surgeon: Earnestine Leys, MD;  Location: [facility] ORS;  Service: Orthopedics;  Laterality: Right;  . UPPER GI ENDOSCOPY  2013   Family History Family History  Problem Relation Age of Onset  . Kidney cancer Father   . Diabetes Mother   . Breast cancer Neg Hx     Social History Social History   Tobacco Use  . Smoking status: Former Smoker    Packs/day: 1.00    Types: Cigarettes  . Smokeless tobacco: Never Used  Substance Use Topics  . Alcohol use: No    Alcohol/week: 0.0 standard drinks  . Drug use: No   Allergies Benadryl [diphenhydramine]  Review of Systems Review of Systems All other systems are reviewed and are negative for acute change except as noted in the HPI  Physical Exam Vital Signs  I have reviewed the triage vital signs BP (!) 149/81   Pulse 97   Temp 98.6 F (37 C) (Oral)   Resp 18   Ht 5' 7"  (1.702 m)   Wt 95.2 kg   SpO2 95%   BMI 32.86 kg/m   Physical Exam Vitals reviewed.  Constitutional:      General: Regina Harris is not in acute distress.    Appearance: Regina Harris is well-developed. Regina Harris is not diaphoretic.  HENT:     Head: Normocephalic and atraumatic.      Nose: Nose normal.  Eyes:     General: No  scleral icterus.       Right eye: No discharge.        Left eye: No discharge.     Conjunctiva/sclera: Conjunctivae normal.     Pupils: Pupils are equal, round, and reactive to light.  Cardiovascular:     Rate and Rhythm: Normal rate and regular rhythm.     Heart sounds: No murmur. No friction rub. No gallop.   Pulmonary:     Effort: Pulmonary effort is normal. No respiratory distress.     Breath sounds: Normal breath sounds. No stridor. No rales.  Abdominal:     General: There is no distension.     Palpations: Abdomen is soft.     Tenderness: There is no abdominal tenderness.  Musculoskeletal:        General: No tenderness.     Cervical back: Normal range of motion and neck supple.     Right lower leg: 2+ Pitting Edema present.     Left lower leg: 2+ Pitting Edema present.       Feet:  Skin:    General: Skin is warm and dry.     Findings: No erythema or rash.  Neurological:     Mental Status: Regina Harris is alert and oriented to person, place, and time.     ED Results and Treatments Labs (all labs ordered are listed, but only abnormal results are displayed) Labs Reviewed  COMPREHENSIVE METABOLIC PANEL - Abnormal; Notable for the following components:      Result Value   Chloride 113 (*)    CO2 16 (*)    BUN 32 (*)    Creatinine, Ser 2.91 (*)    Albumin 2.8 (*)    GFR calc  non Af Amer 15 (*)    GFR calc Af Amer 17 (*)    All other components within normal limits  BRAIN NATRIURETIC PEPTIDE - Abnormal; Notable for the following components:   B Natriuretic Peptide 157.3 (*)    All other components within normal limits  URINALYSIS, ROUTINE W REFLEX MICROSCOPIC - Abnormal; Notable for the following components:   APPearance HAZY (*)    Hgb urine dipstick LARGE (*)    Protein, ur 30 (*)    Leukocytes,Ua MODERATE (*)    RBC / HPF >50 (*)    All other components within normal limits  URINE CULTURE  SARS CORONAVIRUS 2 (TAT 6-24 HRS)  CBC WITH DIFFERENTIAL/PLATELET  TROPONIN I  (HIGH SENSITIVITY)  TROPONIN I (HIGH SENSITIVITY)                                                                                                                         EKG  EKG Interpretation  Date/Time:    Ventricular Rate:    PR Interval:    QRS Duration:   QT Interval:    QTC Calculation:   R Axis:     Text Interpretation:        Radiology DG Knee 2 Views Left  Result Date: 12/12/2019 CLINICAL DATA:  Knee pain EXAM: LEFT KNEE - 1-2 VIEW COMPARISON:  None. FINDINGS: No fracture or dislocation. Tricompartmental osteoarthritis is noted with joint space loss and subchondral sclerosis. No large knee joint effusion. Patellar enthesopathy seen. Vascular calcifications are noted. IMPRESSION: No acute osseous abnormality. Electronically Signed   By: Prudencio Pair M.D.   On: 12/12/2019 03:35   DG Abd 1 View  Result Date: 12/11/2019 CLINICAL DATA:  Ureteral stent. Lower abdominal pain. EXAM: ABDOMEN - 1 VIEW COMPARISON:  November 17, 2019. FINDINGS: The bowel gas pattern is normal. Status post cholecystectomy. Left ureteral stent is noted in grossly good position. Phleboliths are noted in the pelvis. No nephrolithiasis is noted. IMPRESSION: Left ureteral stent is noted in grossly good position. No evidence of bowel obstruction or ileus. Electronically Signed   By: Marijo Conception M.D.   On: 12/11/2019 16:30   CT Head Wo Contrast  Result Date: 12/12/2019 CLINICAL DATA:  Fall weakness EXAM: CT HEAD WITHOUT CONTRAST TECHNIQUE: Contiguous axial images were obtained from the base of the skull through the vertex without intravenous contrast. COMPARISON:  December 07, 2019 brain MRI FINDINGS: Brain: No evidence of acute territorial infarction, hemorrhage, hydrocephalus,extra-axial collection or mass lesion/mass effect. There is dilatation the ventricles and sulci consistent with age-related atrophy. Low-attenuation changes in the deep white matter consistent with small vessel ischemia. Vascular: No  hyperdense vessel or unexpected calcification. Skull: The skull is intact. No fracture or focal lesion identified. Sinuses/Orbits: The visualized paranasal sinuses and mastoid air cells are clear. The orbits and globes intact. Other: Small laceration seen over the posterior left parietal skull. IMPRESSION: No acute intracranial abnormality. Findings consistent with age related atrophy and chronic small  vessel ischemia Electronically Signed   By: Prudencio Pair M.D.   On: 12/12/2019 00:45   US RENAL  Result Date: 12/11/2019 CLINICAL DATA:  Retroperitoneal mass on CT, increasing creatinine EXAM: RENAL / URINARY TRACT ULTRASOUND COMPLETE COMPARISON:  CT abdomen and pelvis 11/18/2019 FINDINGS: Right Kidney: Renal measurements: 11.9 x 7.9 x 7.9 cm = volume: 788 mL. Cortical thinning. Normal cortical echogenicity. Mild-to-moderate RIGHT hydronephrosis. Cystic lesion at upper pole 7.2 x 5.9 x 5.7 cm, simple features. Additional peripelvic/mid RIGHT renal cyst 5.7 x 4.3 x 4.0 cm. No solid masses or shadowing calculi. Left Kidney: Renal measurements: 12.1 x 6.3 x 6.9 cm = volume: 273 mL. Cortical thinning. Mild hydronephrosis. Minimally increased cortical echogenicity. Small mass at upper pole 2.8 x 3.2 x 2.3 cm, by prior CT exam appears represent a splenic lobulation rather than a renal lesion. Small exophytic cyst at inferior pole 2.1 x 1.9 x 1.6 cm. No additional renal mass. Bladder: Stent in urinary bladder.  Bladder poorly distended. Other: Small LEFT pleural effusion. IMPRESSION: BILATERAL hydronephrosis, slightly greater on RIGHT. BILATERAL renal cysts larger on RIGHT. Small LEFT pleural effusion. Electronically Signed   By: Lavonia Dana M.D.   On: 12/11/2019 14:48   DG Chest Port 1 View  Result Date: 12/12/2019 CLINICAL DATA:  Pain status post fall EXAM: PORTABLE CHEST 1 VIEW COMPARISON:  08/01/2019 FINDINGS: There is a new moderate to large left-sided pleural effusion. There is a small right-sided pleural  effusion. The heart size is relatively normal. Aortic calcifications are noted. There is no pneumothorax. There is no definite acute displaced fracture however evaluation of the left-sided ribs is limited by the large pleural effusion and osteopenia. The patient's known posterior mediastinal mass is better appreciated on prior CT. IMPRESSION: Moderate to large left-sided pleural effusion, new since prior study. Electronically Signed   By: Constance Holster M.D.   On: 12/12/2019 01:52   DG Foot 2 Views Left  Result Date: 12/12/2019 CLINICAL DATA:  Pain status post fall EXAM: LEFT FOOT - 2 VIEW COMPARISON:  None. FINDINGS: There is no evidence of fracture or dislocation. There is no evidence of arthropathy or other focal bone abnormality. There is mild soft tissue swelling about the dorsal aspect of the forefoot. Is a small plantar calcaneal spur. There are degenerative changes of the midfoot. IMPRESSION: No acute displaced fracture. Electronically Signed   By: Constance Holster M.D.   On: 12/12/2019 01:46   CT Renal Stone Study  Result Date: 12/12/2019 CLINICAL DATA:  Lymphoma.  Hydronephrosis. EXAM: CT ABDOMEN AND PELVIS WITHOUT CONTRAST TECHNIQUE: Multidetector CT imaging of the abdomen and pelvis was performed following the standard protocol without IV contrast. COMPARISON:  Ultrasound 12/11/2019. PET scan 12/07/2019. CT 11/17/2019. FINDINGS: Lower chest: Enlarging effusions left more than right with atelectasis in both lower lungs. Lymphoma mass in the retrocrural space, increased in volume compared to the study of January 12th. Thickness of tumor in this region up to 3 cm. Previously this measured about 2 cm. Majority encasement of the aorta. Hepatobiliary: No visible liver parenchymal lesion. Previous cholecystectomy. Pancreas: Negative Spleen: Negative Adrenals/Urinary Tract: Left adrenal gland appears normal. Right adrenal gland difficult to separate from retroperitoneal mass. Left kidney shows a  double-J ureteral stent in place. Mild fullness of the left renal collecting system. Renal stent an ureter passes along side a left-sided retroperitoneal mass which has enlarged since the previous study, measuring approximately 4 x 7 cm compared with 2 x 4 cm previously. Hydronephrosis of the  right kidney again seen, with multiple cysts as well. Tumor infiltrating the region of the hilum has increased in volume. Bladder appears unremarkable, except for the left double-J ureteral stent entering the bladder. Stomach/Bowel: No evidence of bowel obstruction. Mild edema adjacent to the proximal sigmoid colon could possibly represent low level diverticulitis. This is more pronounced than was seen previously. Vascular/Lymphatic: See above regarding extensive retroperitoneal lymphoma tissue. Reproductive: Previous hysterectomy. Other: No free fluid or air. Musculoskeletal: Subacute fractures of the spine as seen previously, not grossly progressive. No lytic lesion or pathologic fracture of the pelvic bones. IMPRESSION: Pleural effusions larger on the left than the right with dependent atelectasis. Increase in tumor volume in the retrocrural region and the left retroperitoneum at the abdominopelvic junction region. See above. Increase in amount of tumor in the right renal hilar region. Double-J ureteral stent in place on the left. Mild fullness of the left renal collecting system. Hydronephrosis of the right kidney, likely obstructed by the tumor in the renal hilar region. Increased edema in the fat in the proximal sigmoid colon region suggests mild diverticulitis. Multiple lumbar compression fractures at not visibly progressed. Electronically Signed   By: Nelson Chimes M.D.   On: 12/12/2019 05:24    Pertinent labs & imaging results that were available during my care of the patient were reviewed by me and considered in my medical decision making (see chart for details).  Medications Ordered in ED Medications   atorvastatin (LIPITOR) tablet 10 mg (has no administration in time range)  donepezil (ARICEPT) tablet 5 mg (has no administration in time range)  levothyroxine (SYNTHROID) tablet 75 mcg (75 mcg Oral Given 12/12/19 0528)  pantoprazole (PROTONIX) EC tablet 40 mg (has no administration in time range)  traMADol (ULTRAM) tablet 50 mg (has no administration in time range)  aspirin EC tablet 81 mg (has no administration in time range)  acetaminophen (TYLENOL) tablet 650 mg (has no administration in time range)    Or  acetaminophen (TYLENOL) suppository 650 mg (has no administration in time range)  ondansetron (ZOFRAN) tablet 4 mg (has no administration in time range)    Or  ondansetron (ZOFRAN) injection 4 mg (has no administration in time range)  heparin injection 5,000 Units (has no administration in time range)  lidocaine (PF) (XYLOCAINE) 1 % injection 5 mL (5 mLs Intradermal Given 12/12/19 0223)  traMADol (ULTRAM) tablet 50 mg (50 mg Oral Given 12/12/19 0240)  cefTRIAXone (ROCEPHIN) 1 g in sodium chloride 0.9 % 100 mL IVPB (1 g Intravenous New Bag/Given 12/12/19 0528)                                                                                                                                    Procedures Procedures  (including critical care time)  Medical Decision Making / ED Course I have reviewed the nursing notes for this encounter and the patient's prior records (if available in EHR or  on provided paperwork).   KIERRIA FEIGENBAUM was evaluated in Emergency Department on 12/12/2019 for the symptoms described in the history of present illness. Regina Harris was evaluated in the context of the global COVID-19 pandemic, which necessitated consideration that the patient might be at risk for infection with the SARS-CoV-2 virus that causes COVID-19. Institutional protocols and algorithms that pertain to the evaluation of patients at risk for COVID-19 are in a state of rapid change based on information released by  regulatory bodies including the CDC and federal and state organizations. These policies and algorithms were followed during the patient's care in the ED.  CT head without ICH.  Plain films without evidence of acute fracture.  Laceration of the foot thoroughly irrigated and cleaned by APP.  Patient with worsening renal function.  Case discussed with hospitalist who will admit the patient.  Renal ultrasound from yesterday noted bilateral hydronephrosis. Urology consulted who will evaluate the patient in admission. UA questionable for infection.  Started on Rocephin.      Final Clinical Impression(s) / ED Diagnoses Final diagnoses:  Fall  Laceration of left foot, initial encounter  Bilateral hydronephrosis  Retroperitoneal mass  AKI (acute kidney injury) ([place])      This chart was dictated using voice recognition software.  Despite best efforts to proofread,  errors can occur which can change the documentation meaning.   Fatima Blank, MD 12/12/19 [phone]

## 2019-12-12 NOTE — ED Notes (Signed)
Pt to CT

## 2019-12-12 NOTE — Progress Notes (Signed)
VASCULAR LAB PRELIMINARY  PRELIMINARY  PRELIMINARY  PRELIMINARY  Bilateral lower extremity venous duplex completed.    Preliminary report:  See CV proc for preliminary results.   Issa Kosmicki, RVT 12/12/2019, 6:09 PM

## 2019-12-12 NOTE — ED Notes (Signed)
Patient to CT.

## 2019-12-12 NOTE — Consult Note (Signed)
I have been asked to see the patient by Dr. Barb Merino, for evaluation and management of hydronephrosis.  History of present illness: 84 year old female with a history of a retroperitoneal mass which thus far is undiagnosed.  The 2 leading differential diagnoses include lymphoma or multiple myeloma.  She was admitted to the hospital several weeks ago and a biopsy was nondiagnostic.  At that time she was noted to have worsening renal function and mild left hydroureteronephrosis.  A ureter stent was placed in the renal function did improve.  Her right kidney has numerous cysts and peripelvic cyst making it very difficult to evaluate for hydronephrosis.  The patient presented to the [facility] emergency department with generalized weakness and difficulty breathing.  Follow-up CT scan did show slightly worsening of the retroperitoneal mass and possible increased left hydronephrosis.  The right side is difficult to interpret.  The patient was also noted to have significant amount of pleural fluid.  Her renal function was also noted to be significantly worse.  The patient herself was complaining of no significant pain, just weakness and shortness of breath.  Since the patient was admitted to the hospital she had a thoracentesis and approximately 600 cc of fluid was removed.  She is now breathing significantly better.  Again she complains of no significant pain.  She is voiding without difficulty, and what she would consider a normal amount.  She denies any burning.  Review of systems: A 12 point comprehensive review of systems was obtained and is negative unless otherwise stated in the history of present illness.  Patient Active Problem List   Diagnosis Date Noted  . Bilateral hydronephrosis 12/12/2019  . Retroperitoneal mass 12/04/2019  . AKI (acute kidney injury) ([place]) 11/17/2019  . Acute metabolic encephalopathy 72/53/6644  . Acute lower UTI 07/29/2019  . Pneumonia due to COVID-19 virus 07/29/2019   . COVID-19 virus infection 07/29/2019  . UTI (urinary tract infection) 07/27/2019  . Nasal congestion 07/27/2019  . B12 deficiency 01/05/2019  . Memory loss 12/30/2018  . Poor balance 12/30/2018  . Low back pain 10/14/2018  . Fatigue 10/01/2018  . Abnormal urinalysis 10/01/2018  . 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 Right leg swelling 06/09/2018  . Right knee pain 06/09/2018  . Baker's cyst of knee, right 06/09/2018  . Encounter for screening mammogram for breast cancer 02/27/2017  . Estrogen deficiency 02/27/2017  . Foot pain, bilateral 02/27/2017  . Hypothyroidism 04/16/2016  . Pedal edema 04/05/2016  . Right rotator cuff tear 03/09/2016  . Hypokalemia 03/01/2016  . Nodule of chest wall 10/06/2014  . Left breast mass 06/30/2014  . Encounter for Medicare annual wellness exam 02/22/2014  . Polycythemia, secondary 02/22/2014  . GERD (gastroesophageal reflux disease) 08/24/2013  . Lump or mass in breast 05/06/2013  . Post-menopausal 06/24/2012  . Obesity 12/24/2011  . Prediabetes 03/12/2011  . NEOPLASM UNSPECIFIED NATURE DIGESTIVE SYSTEM 07/24/2010  . LUNG NODULE 01/16/2010  . HYPERCHOLESTEROLEMIA, PURE 07/10/2007  . Former smoker 03/19/2007  . Essential hypertension 03/19/2007  . Carotid bruit 03/19/2007  . COLONIC POLYPS, HX OF 03/19/2007    No current facility-administered medications on file prior to encounter.   Current Outpatient Medications on File Prior to Encounter  Medication Sig Dispense Refill  . acetaminophen (TYLENOL) 500 MG tablet Take 2 tablets (1,000 mg total) by mouth 3 (three) times daily. 30 tablet 0  . aspirin EC 81 MG tablet Take 81 mg by mouth daily.    Marland Kitchen atorvastatin (LIPITOR) 10 MG tablet TAKE 1  TABLET BY MOUTH ONCE DAILY (Patient taking differently: Take 10 mg by mouth daily. ) 90 tablet 1  . Calcium Carb-Cholecalciferol (CALCIUM 1000 + D) 1000-800 MG-UNIT TABS Take 1 tablet by mouth 2 (two) times daily.     . cyanocobalamin 1000 MCG  tablet Take 1,000 mcg by mouth daily.    Marland Kitchen donepezil (ARICEPT) 5 MG tablet TAKE 1 TABLET BY MOUTH AT BEDTIME (Patient taking differently: Take 5 mg by mouth at bedtime. ) 30 tablet 5  . furosemide (LASIX) 20 MG tablet TAKE 1 TABLET BY MOUTH ONCE A DAY (Patient taking differently: Take 20 mg by mouth daily. ) 90 tablet 1  . gabapentin (NEURONTIN) 400 MG capsule TAKE 1 CAPSULE BY MOUTH TWICE DAILY (Patient taking differently: Take 400 mg by mouth 2 (two) times daily. ) 180 capsule 1  . levothyroxine (SYNTHROID) 75 MCG tablet Take 75 mcg by mouth daily with breakfast.     . megestrol (MEGACE) 40 MG tablet Take 1 tablet (40 mg total) by mouth daily. 30 tablet 1  . omeprazole (PRILOSEC) 20 MG capsule TAKE 1 CAPSULE BY MOUTH TWICE DAILY (Patient taking differently: Take 20 mg by mouth 2 (two) times daily. ) 180 capsule 1  . potassium chloride SA (K-DUR) 20 MEQ tablet TAKE 1 TABLET BY MOUTH TWICE (2) DAILY (Patient taking differently: Take 20 mEq by mouth 2 (two) times daily. ) 180 tablet 1  . traMADol (ULTRAM) 50 MG tablet Take 1 tablet (50 mg total) by mouth every 6 (six) hours as needed. (Patient taking differently: Take 50 mg by mouth every 6 (six) hours as needed for moderate pain. ) 60 tablet 1  . zinc sulfate 220 (50 Zn) MG capsule Take 1 capsule (220 mg total) by mouth daily.    . [DISCONTINUED] potassium chloride (K-DUR) 10 MEQ tablet Take 1 tablet (10 mEq total) by mouth daily. 30 tablet 3    Past Medical History:  Diagnosis Date  . Barrett esophagus   . Carotid bruit   . Diabetes mellitus without complication ([place])   . Esophagitis   . Gastritis 2013  . GERD (gastroesophageal reflux disease)   . HH (hiatus hernia)   . History of colonic polyps   . History of repair of right rotator cuff   . Hyperlipidemia   . Hypertension   . Hyperthyroidism 06/09/2018   Per [facility] RAI Therapy for Hyperthyroidism order  . Lump or mass in breast   . Pneumonia   . Polycythemia, secondary   . Tobacco abuse      Past Surgical History:  Procedure Laterality Date  . ABDOMINAL HYSTERECTOMY    . APPENDECTOMY    . BREAST BIOPSY Right 1996  . BREAST BIOPSY Left 10/09/2012   Benign breast tissue with focal fat necrosis and focal periductal chronic inflammation.  Marland Kitchen BREAST BIOPSY Left 10/09/2012   Benign breast tissue with focal fat necrosis and focal periductal chronic inflammation.  Marland Kitchen CATARACT EXTRACTION  3/11   Dr Charise Killian  . CHOLECYSTECTOMY    . COLONOSCOPY  2009  . CYSTOSCOPY WITH URETEROSCOPY AND STENT PLACEMENT Left 11/19/2019   Procedure: CYSTOSCOPY WITH URETEROSCOPY AND STENT PLACEMENT;  Surgeon: Billey Co, MD;  Location: [facility] ORS;  Service: Urology;  Laterality: Left;  . ESOPHAGOGASTRODUODENOSCOPY (EGD) WITH PROPOFOL N/A 11/02/2015   Procedure: ESOPHAGOGASTRODUODENOSCOPY (EGD) WITH PROPOFOL;  Surgeon: Manya Silvas, MD;  Location: [facility] ENDOSCOPY;  Service: Endoscopy;  Laterality: N/A;  . SHOULDER ARTHROSCOPY WITH OPEN ROTATOR CUFF REPAIR Right 03/05/2016  Procedure: SHOULDER ARTHROSCOPY WITH OPEN ROTATOR CUFF REPAIR;  Surgeon: Earnestine Leys, MD;  Location: [facility] ORS;  Service: Orthopedics;  Laterality: Right;  . UPPER GI ENDOSCOPY  2013    Social History   Tobacco Use  . Smoking status: Former Smoker    Packs/day: 1.00    Types: Cigarettes  . Smokeless tobacco: Never Used  Substance Use Topics  . Alcohol use: No    Alcohol/week: 0.0 standard drinks  . Drug use: No    Family History  Problem Relation Age of Onset  . Kidney cancer Father   . Diabetes Mother   . Breast cancer Neg Hx     PE: Vitals:   12/12/19 0930 12/12/19 0935 12/12/19 0940 12/12/19 0945  BP: (!) 147/83 138/69 139/72 (!) 143/74  Pulse:      Resp:      Temp:      TempSrc:      SpO2: 97% (!) 87% 91% 93%  Weight:      Height:       Patient appears to be in no acute distress  patient is alert and oriented x3 Atraumatic normocephalic head No cervical or supraclavicular lymphadenopathy  appreciated No increased work of breathing, no audible wheezes/rhonchi Regular sinus rhythm/rate Abdomen is soft, nontender, nondistended, no CVA or suprapubic tenderness Lower extremities are symmetric without appreciable edema Grossly neurologically intact No identifiable skin lesions  Recent Labs    12/10/19 1422 12/12/19 0048  WBC 4.4 5.4  HGB 11.8* 13.1  HCT 40.1 42.0   Recent Labs    12/10/19 1422 12/12/19 0048  NA 142 143  K 4.4 4.7  CL 113* 113*  CO2 18* 16*  GLUCOSE 101* 93  BUN 30* 32*  CREATININE 2.00* 2.91*  CALCIUM 9.0 9.4   No results for input(s): LABPT, INR in the last 72 hours. No results for input(s): LABURIN in the last 72 hours. Results for orders placed or performed during the hospital encounter of 12/11/19  SARS CORONAVIRUS 2 (TAT 6-24 HRS) Nasopharyngeal Nasopharyngeal Swab     Status: None   Collection Time: 12/12/19  4:24 AM   Specimen: Nasopharyngeal Swab  Result Value Ref Range Status   SARS Coronavirus 2 NEGATIVE NEGATIVE Final    Comment: (NOTE) SARS-CoV-2 target nucleic acids are NOT DETECTED. The SARS-CoV-2 RNA is generally detectable in upper and lower respiratory specimens during the acute phase of infection. Negative results do not preclude SARS-CoV-2 infection, do not rule out co-infections with other pathogens, and should not be used as the sole basis for treatment or other patient management decisions. Negative results must be combined with clinical observations, patient history, and epidemiological information. The expected result is Negative. Fact Sheet for Patients: SugarRoll.be Fact Sheet for Healthcare Providers: https://www.woods-mathews.com/ This test is not yet approved or cleared by the [place] FDA and  has been authorized for detection and/or diagnosis of SARS-CoV-2 by FDA under an Emergency Use Authorization (EUA). This EUA will remain  in effect (meaning this test can  be used) for the duration of the COVID-19 declaration under Section 56 4(b)(1) of the Act, 21 U.S.C. section 360bbb-3(b)(1), unless the authorization is terminated or revoked sooner. Performed at [facility], [place] [address], [place], [place] [address]   Gram stain     Status: None (Preliminary result)   Collection Time: 12/12/19 10:15 AM   Specimen: PATH Cytology Pleural fluid  Result Value Ref Range Status   Specimen Description PLEURAL LEFT  Final   Special  Requests NONE  Final   Gram Stain   Final    MODERATE WBC PRESENT, PREDOMINANTLY MONONUCLEAR NO ORGANISMS SEEN Performed at [facility], [place] [address] [place], [place] [address]    Report Status PENDING  Incomplete    Imaging: I have reviewed the patient's CT scan, noncontrast abdomen and pelvis performed to the emergency department this morning.  I also reviewed the patient's CT scan that was performed 3 weeks prior and compared them.  We also looked at the CT scan from 2007.  These demonstrate progressively worsening left retroperitoneal and periaortic mass encasing the left ureter.  The right kidney is also slightly larger, but the renal pelvis is very hard to recognize amongst all her peripelvic and parenchymal cyst.  Her stent does seem to be in the appropriate position.  Imp: The patient has significantly worsening renal function.  The question remains whether this is a obstructive renal failure or whether it is prerenal and related to buildup of the myeloma proteins.  I do recognize a slight worsening of her left hydronephrosis, the right side is very difficult to interpret.  I do not think the degree of her worsening hydronephrosis is in keeping with the severity of her acute renal failure.  Thus I do think the possibility of the renal failure being related impart to the serum proteins related to the disease process is a distinct possibility.  Recommendations: In consultation with interventional radiology we  have opted to obtain a Lasix renogram to ascertain whether or not her kidneys are draining appropriately and whether they are obstructed.  This will give [place] a sense of whether a nephrostomy tube would be of any significant value.  We have ordered this to take place today, and hopefully tomorrow if needed we can have nephrostomy tubes placed.  In addition, I do think that it is worth discussing her case with oncology, notably her primary oncologist who seems to have a good understanding of her disease process and has been taking care of her throughout her disease course.  Further, it also may be helpful to discuss her situation with nephrology to see if there is anything else that we can do to optimize her kidney function in this particular setting.  I will follow-up with the patient tomorrow morning.  Recommendations:   Thank you for involving me in this patient's care, I will continue to follow along.  60 min of time was spent in both face to face time with the patient and in coordination of her care.  Ardis Hughs

## 2019-12-12 NOTE — ED Notes (Signed)
Per MD Alcario Drought, diet order to be put in by daytime MD, treatment plan also to be further determined by daytime MD.

## 2019-12-12 NOTE — ED Provider Notes (Signed)
LACERATION REPAIR Performed by: Dewaine Oats Authorized by: Dewaine Oats Consent: Verbal consent obtained. Risks and benefits: risks, benefits and alternatives were discussed Consent given by: patient Patient identity confirmed: provided demographic data Prepped and Draped in normal sterile fashion Wound explored  Laceration Location: left 4th toe base  Laceration Length: 1 cm  No Foreign Bodies seen or palpated  Anesthesia: local infiltration  Local anesthetic: lidocaine 1% w/o epinephrine  Anesthetic total: 1 ml  Irrigation method: syringe Amount of cleaning: standard  Skin closure: 5-0 ethilon  Number of sutures: 4  Technique: simple interrupted  Patient tolerance: Patient tolerated the procedure well with no immediate complications.    Charlann Lange, PA-C 12/12/19 0309    Fatima Blank, MD 12/12/19 [phone]

## 2019-12-12 NOTE — ED Notes (Signed)
Urine culture already added by Robin EMT.

## 2019-12-12 NOTE — Plan of Care (Signed)
?  Problem: Clinical Measurements: ?Goal: Will remain free from infection ?Outcome: Progressing ?  ?

## 2019-12-12 NOTE — Progress Notes (Signed)
Patient was seen and examined.  Admitted by nighttime hospitalist early morning hours.  I called and discussed with patient's daughter and also granddaughter Ms [name] who is an Therapist, sports and also involved in her care very closely.  84 year old, diagnosed with retroperitoneal malignant neoplasm about a month ago, hydronephrosis status post cystoscopy with left ureteral stent placement 1/14 followed by urology and oncology at [facility], recently treated for E. coli UTI.  Final pathology consistent with lymphoma versus myeloma.  Not on treatment yet.  Progressively declining performance status and worsening creatinine of 2 seen in the office and sent to the ER. Patient is poor historian.  Repeat creatinine is 2.9.  Making urine.  Potassium is normal.  #1. acute renal failure: Consistent with obstructive nephropathy.  Renal functions were normal until 09/2019.  Holding Lasix.  Maintenance IV fluids.  CT scan with worsening hydronephrosis and tumor burden.  Following up with urology today to see if she benefits with any procedure including ureteric stent or percutaneous nephrostomy.  #2. pleural effusion left: Patient is not in any obvious shortness of breath or distress.  However, she does have moderate pleural effusion.  After discussing with patient's granddaughter, I sent her for thoracentesis For diagnostic and therapeutic purpose.  Gram stain negative.  Hoping to find any malignant cells, cytology sent.  #3. retroperitoneal malignancy: Lymphoma versus myeloma.  Followed by Dr. Grayland Ormond at [facility] oncology.  Diagnostic studies and further plan will pending. We will check cytology on pleural fluid, however less likely to show any cells evidence of lymphoma. Patient's overall performance status is very poor, she already needs 24 hours care at home. Consult palliative care, if pleural fluid is positive for malignancy, she may benefit with hospice level of care. We will defer decision to oncology.

## 2019-12-12 NOTE — Procedures (Signed)
Ultrasound-guided diagnostic and therapeutic Right sided thoracentesis performed yielding 650 mililiters of amber colored fluid. No immediate complications.   Diagnostic fluid was sent to the lab for further analysis. Follow-up chest x-ray pending. EBL is none.

## 2019-12-12 NOTE — Progress Notes (Addendum)
Interventional Radiology Progress Note  83 yo female with new acute renal failure, with uncertain etiology.   Possible etiology might be secondary to presentation of Multiple Myeloma, from obstruction of ureters/stents secondary to mass effect, or other.   Left ureteral stent in place.  With mild left hydro.  Questionable right hydro with cystic kidney change.  Discussed with Dr. Herrick, as patient might be candidate for decompression with single or bilateral PCN.   We will attempt to clarify etiology with [facility] renal MAG3 study.  I have ordered study and spoken with the tech on call, with the capability to perform before Monday depending on the nuclear pharmacy.    Call with questions/concerns.   Signed,   S. , DO    

## 2019-12-13 ENCOUNTER — Inpatient Hospital Stay (HOSPITAL_COMMUNITY): Payer: Medicare Other

## 2019-12-13 HISTORY — PX: IR NEPHROSTOMY PLACEMENT LEFT: IMG6063

## 2019-12-13 LAB — PROTEIN / CREATININE RATIO, URINE
Creatinine, Urine: 126.6 mg/dL
Protein Creatinine Ratio: 4.18 mg/mg{Cre} — ABNORMAL HIGH (ref 0.00–0.15)
Total Protein, Urine: 529 mg/dL

## 2019-12-13 LAB — CBC WITH DIFFERENTIAL/PLATELET
Abs Immature Granulocytes: 0.02 10*3/uL (ref 0.00–0.07)
Basophils Absolute: 0 10*3/uL (ref 0.0–0.1)
Basophils Relative: 1 %
Eosinophils Absolute: 0.1 10*3/uL (ref 0.0–0.5)
Eosinophils Relative: 3 %
HCT: 36.2 % (ref 36.0–46.0)
Hemoglobin: 11.5 g/dL — ABNORMAL LOW (ref 12.0–15.0)
Immature Granulocytes: 1 %
Lymphocytes Relative: 22 %
Lymphs Abs: 0.9 10*3/uL (ref 0.7–4.0)
MCH: 31 pg (ref 26.0–34.0)
MCHC: 31.8 g/dL (ref 30.0–36.0)
MCV: 97.6 fL (ref 80.0–100.0)
Monocytes Absolute: 0.6 10*3/uL (ref 0.1–1.0)
Monocytes Relative: 14 %
Neutro Abs: 2.4 10*3/uL (ref 1.7–7.7)
Neutrophils Relative %: 59 %
Platelets: 131 10*3/uL — ABNORMAL LOW (ref 150–400)
RBC: 3.71 MIL/uL — ABNORMAL LOW (ref 3.87–5.11)
RDW: 12.5 % (ref 11.5–15.5)
WBC: 4 10*3/uL (ref 4.0–10.5)
nRBC: 0 % (ref 0.0–0.2)

## 2019-12-13 LAB — BASIC METABOLIC PANEL
Anion gap: 12 (ref 5–15)
BUN: 32 mg/dL — ABNORMAL HIGH (ref 8–23)
CO2: 17 mmol/L — ABNORMAL LOW (ref 22–32)
Calcium: 8.5 mg/dL — ABNORMAL LOW (ref 8.9–10.3)
Chloride: 113 mmol/L — ABNORMAL HIGH (ref 98–111)
Creatinine, Ser: 3.38 mg/dL — ABNORMAL HIGH (ref 0.44–1.00)
GFR calc Af Amer: 14 mL/min — ABNORMAL LOW (ref >60)
GFR calc non Af Amer: 12 mL/min — ABNORMAL LOW (ref >60)
Glucose, Bld: 101 mg/dL — ABNORMAL HIGH (ref 70–99)
Potassium: 4.2 mmol/L (ref 3.5–5.1)
Sodium: 142 mmol/L (ref 135–145)

## 2019-12-13 MED ORDER — FENTANYL CITRATE (PF) 100 MCG/2ML IJ SOLN
INTRAMUSCULAR | Status: AC | PRN
  Administered 2019-12-13 (×2): 50 ug via INTRAVENOUS

## 2019-12-13 MED ORDER — LIDOCAINE HCL 1 % IJ SOLN
INTRAMUSCULAR | Status: AC
  Filled 2019-12-13: qty 20

## 2019-12-13 MED ORDER — FENTANYL CITRATE (PF) 100 MCG/2ML IJ SOLN
INTRAMUSCULAR | Status: AC
  Filled 2019-12-13: qty 4

## 2019-12-13 MED ORDER — LIDOCAINE HCL (PF) 1 % IJ SOLN
INTRAMUSCULAR | Status: AC | PRN
  Administered 2019-12-13: 10 mL

## 2019-12-13 MED ORDER — SODIUM BICARBONATE 650 MG PO TABS
1300.0000 mg | ORAL_TABLET | Freq: Two times a day (BID) | ORAL | Status: DC
  Administered 2019-12-13 – 2019-12-15 (×4): 1300 mg via ORAL
  Filled 2019-12-13 (×5): qty 2

## 2019-12-13 MED ORDER — MIDAZOLAM HCL 2 MG/2ML IJ SOLN
INTRAMUSCULAR | Status: AC | PRN
  Administered 2019-12-13 (×2): 1 mg via INTRAVENOUS

## 2019-12-13 MED ORDER — IOHEXOL 300 MG/ML  SOLN
50.0000 mL | Freq: Once | INTRAMUSCULAR | Status: AC | PRN
  Administered 2019-12-13: 10 mL

## 2019-12-13 MED ORDER — MIDAZOLAM HCL 2 MG/2ML IJ SOLN
INTRAMUSCULAR | Status: AC
  Filled 2019-12-13: qty 6

## 2019-12-13 MED ORDER — TECHNETIUM TC 99M MERTIATIDE
5.3800 | Freq: Once | INTRAVENOUS | Status: AC | PRN
  Administered 2019-12-13: 5.38 via INTRAVENOUS

## 2019-12-13 NOTE — Plan of Care (Signed)
  Problem: Education: Goal: Knowledge of General Education information will improve Description Including pain rating scale, medication(s)/side effects and non-pharmacologic comfort measures Outcome: Progressing   

## 2019-12-13 NOTE — Progress Notes (Signed)
PROGRESS NOTE    Regina Harris  XLK:[id number] DOB: 02-Sep-1936 DOA: 12/11/2019 PCP: Abner Greenspan, MD    Brief Narrative:  84 year old, diagnosed with retroperitoneal malignant neoplasm about a month ago, hydronephrosis status post cystoscopy with left ureteral stent placement 1/14 followed by urology and oncology at [facility], recently treated for E. coli UTI.  Final pathology consistent with lymphoma versus myeloma.  Not on treatment yet.  Progressively declining performance status and worsening creatinine of 2 seen in the office and sent to the ER. Patient herself is poor historian, just complains of being more weaker.  Assessment & Plan:   Principal Problem:   Bilateral hydronephrosis Active Problems:   UTI (urinary tract infection)   AKI (acute kidney injury) ([place])   Retroperitoneal mass  #1. acute renal failure: Myeloma kidney versus obstructive nephropathy.  Normal renal function center until 09/2019.  Holding Lasix.  Continue Maintenance IV fluids.  CT scan with worsening hydronephrosis and tumor burden.  Followed by urology, IR following, plan for Lasix scan and if obstruction, for percutaneous nephrostomy. We will discuss with nephrology to evaluate. Potassium is normal.  Urine output is adequate, 850 mL last 24 hours.  No evidence of uremia.  #2. pleural effusion left: 850 mL removed from left lungs.  Gram stain without evidence of bacteria.  Cytology pending.  Probably malignant pleural effusion given presence of atypical cells.  #3. retroperitoneal malignancy: Lymphoma versus myeloma.  Followed by Dr. Grayland Ormond at [facility] oncology.  Diagnostic studies and further plan are pending. Cytology sent from pleural fluid. Patient's overall performance status is very poor, she already needs 24 hours care at home. Consult palliative care, if pleural fluid is positive for malignancy, she may benefit with hospice level of care. Will discuss and notify her oncologist Monday morning,  2/8. Lower extremity duplexes negative for DVT.  DVT prophylaxis: SCDs Code Status: Full code Family Communication: Patient's granddaughter Ms. [name], 2/6, will update today Disposition Plan: patient is from home with 24 hours care. Anticipated DC to home, Barriers to discharge worsening renal functions, need for inpatient treatment and procedures   Consultants:   Urology  Nephrology  Interventional radiology  Procedures:   Left thoracentesis  Antimicrobials:   Rocephin   Subjective: Patient seen and examined.  No overnight events.  Patient herself is poor historian. She thinks he is at [facility]. She does not know she has kidney problems or has any tumors.  She defers it to her granddaughter [name]. Denies any nausea vomiting.  Denies any urinary problems.  No abdominal pain.  Objective: Vitals:   12/12/19 0945 12/12/19 1656 12/12/19 2044 12/13/19 0448  BP: (!) 143/74 137/70 (!) 143/72 (!) 158/54  Pulse:  88 87 (!) 102  Resp:   16 18  Temp:  98 F (36.7 C) 98 F (36.7 C) 98.1 F (36.7 C)  TempSrc:  Oral Oral Oral  SpO2: 93% 94% 97% 94%  Weight:   96.6 kg   Height:        Intake/Output Summary (Last 24 hours) at 12/13/2019 1009 Last data filed at 12/13/2019 0814 Gross per 24 hour  Intake 1663.04 ml  Output 725 ml  Net 938.04 ml   Filed Weights   12/12/19 0459 12/12/19 2044  Weight: 95.2 kg 96.6 kg    Examination:  General exam: Appears calm and comfortable, comfortable on room air.  Not in any distress.  Pleasantly confused. Respiratory system: Clear to auscultation. Respiratory effort normal.  No added sounds. Cardiovascular system: S1 &  S2 heard, RRR. No JVD, murmurs, rubs, gallops or clicks.  2+ pedal edema, shiny and tenderness of the calf present. Gastrointestinal system: Abdomen is nondistended, soft and nontender. No organomegaly or masses felt. Normal bowel sounds heard. Central nervous system: Alert and oriented x2. No focal neurological  deficits. Extremities: Symmetric 5 x 5 power. Skin: No rashes, lesions or ulcers Psychiatry: Judgement and insight appear normal. Mood & affect flat.    Data Reviewed: I have personally reviewed following labs and imaging studies  CBC: Recent Labs  Lab 12/10/19 1422 12/12/19 0048 12/13/19 0633  WBC 4.4 5.4 4.0  NEUTROABS 2.8 3.9 2.4  HGB 11.8* 13.1 11.5*  HCT 40.1 42.0 36.2  MCV 103.9* 98.8 97.6  PLT 168 170 626*   Basic Metabolic Panel: Recent Labs  Lab 12/10/19 1422 12/12/19 0048 12/13/19 0633  NA 142 143 142  K 4.4 4.7 4.2  CL 113* 113* 113*  CO2 18* 16* 17*  GLUCOSE 101* 93 101*  BUN 30* 32* 32*  CREATININE 2.00* 2.91* 3.38*  CALCIUM 9.0 9.4 8.5*   GFR: Estimated Creatinine Clearance: 15.6 mL/min (A) (by C-G formula based on SCr of 3.38 mg/dL (H)). Liver Function Tests: Recent Labs  Lab 12/12/19 0048  AST 18  ALT 10  ALKPHOS 49  BILITOT 0.5  PROT 7.9  ALBUMIN 2.8*   No results for input(s): LIPASE, AMYLASE in the last 168 hours. No results for input(s): AMMONIA in the last 168 hours. Coagulation Profile: No results for input(s): INR, PROTIME in the last 168 hours. Cardiac Enzymes: No results for input(s): CKTOTAL, CKMB, CKMBINDEX, TROPONINI in the last 168 hours. BNP (last 3 results) No results for input(s): PROBNP in the last 8760 hours. HbA1C: No results for input(s): HGBA1C in the last 72 hours. CBG: Recent Labs  Lab 12/07/19 1133  GLUCAP 81   Lipid Profile: No results for input(s): CHOL, HDL, LDLCALC, TRIG, CHOLHDL, LDLDIRECT in the last 72 hours. Thyroid Function Tests: No results for input(s): TSH, T4TOTAL, FREET4, T3FREE, THYROIDAB in the last 72 hours. Anemia Panel: No results for input(s): VITAMINB12, FOLATE, FERRITIN, TIBC, IRON, RETICCTPCT in the last 72 hours. Sepsis Labs: No results for input(s): PROCALCITON, LATICACIDVEN in the last 168 hours.  Recent Results (from the past 240 hour(s))  SARS CORONAVIRUS 2 (TAT 6-24 HRS)  Nasopharyngeal Nasopharyngeal Swab     Status: None   Collection Time: 12/12/19  4:24 AM   Specimen: Nasopharyngeal Swab  Result Value Ref Range Status   SARS Coronavirus 2 NEGATIVE NEGATIVE Final    Comment: (NOTE) SARS-CoV-2 target nucleic acids are NOT DETECTED. The SARS-CoV-2 RNA is generally detectable in upper and lower respiratory specimens during the acute phase of infection. Negative results do not preclude SARS-CoV-2 infection, do not rule out co-infections with other pathogens, and should not be used as the sole basis for treatment or other patient management decisions. Negative results must be combined with clinical observations, patient history, and epidemiological information. The expected result is Negative. Fact Sheet for Patients: SugarRoll.be Fact Sheet for Healthcare Providers: https://www.woods-mathews.com/ This test is not yet approved or cleared by the [place] FDA and  has been authorized for detection and/or diagnosis of SARS-CoV-2 by FDA under an Emergency Use Authorization (EUA). This EUA will remain  in effect (meaning this test can be used) for the duration of the COVID-19 declaration under Section 56 4(b)(1) of the Act, 21 U.S.C. section 360bbb-3(b)(1), unless the authorization is terminated or revoked sooner. Performed at [facility],  [address], [place], [place] [address]   Gram stain     Status: None (Preliminary result)   Collection Time: 12/12/19 10:15 AM   Specimen: PATH Cytology Pleural fluid  Result Value Ref Range Status   Specimen Description PLEURAL LEFT  Final   Special Requests NONE  Final   Gram Stain   Final    MODERATE WBC PRESENT, PREDOMINANTLY MONONUCLEAR NO ORGANISMS SEEN Performed at [facility], [address] [place], [place] [address]    Report Status PENDING  Incomplete  Culture, body fluid-bottle     Status: None (Preliminary result)   Collection Time:  12/12/19 10:15 AM   Specimen: Pleura  Result Value Ref Range Status   Specimen Description PLEURAL LEFT  Final   Special Requests NONE  Final   Culture   Final    NO GROWTH < 24 HOURS Performed at [facility], [place] [address], [place], [place] [address]    Report Status PENDING  Incomplete         Radiology Studies: DG Chest 1 View  Result Date: 12/12/2019 CLINICAL DATA:  Status post LEFT thoracentesis. EXAM: CHEST  1 VIEW COMPARISON:  12/12/2019 FINDINGS: LEFT pleural effusion has decreased, now trace. LEFT basilar atelectasis has improved. A trace RIGHT pleural effusion may be present. There is no evidence of pneumothorax. IMPRESSION: Decreased LEFT pleural effusion, now trace, with improved LEFT basilar atelectasis. No evidence of pneumothorax. Electronically Signed   By: Margarette Canada M.D.   On: 12/12/2019 10:49   DG Knee 2 Views Left  Result Date: 12/12/2019 CLINICAL DATA:  Knee pain EXAM: LEFT KNEE - 1-2 VIEW COMPARISON:  None. FINDINGS: No fracture or dislocation. Tricompartmental osteoarthritis is noted with joint space loss and subchondral sclerosis. No large knee joint effusion. Patellar enthesopathy seen. Vascular calcifications are noted. IMPRESSION: No acute osseous abnormality. Electronically Signed   By: Prudencio Pair M.D.   On: 12/12/2019 03:35   DG Abd 1 View  Result Date: 12/11/2019 CLINICAL DATA:  Ureteral stent. Lower abdominal pain. EXAM: ABDOMEN - 1 VIEW COMPARISON:  November 17, 2019. FINDINGS: The bowel gas pattern is normal. Status post cholecystectomy. Left ureteral stent is noted in grossly good position. Phleboliths are noted in the pelvis. No nephrolithiasis is noted. IMPRESSION: Left ureteral stent is noted in grossly good position. No evidence of bowel obstruction or ileus. Electronically Signed   By: Marijo Conception M.D.   On: 12/11/2019 16:30   CT Head Wo Contrast  Result Date: 12/12/2019 CLINICAL DATA:  Fall weakness EXAM: CT HEAD WITHOUT CONTRAST  TECHNIQUE: Contiguous axial images were obtained from the base of the skull through the vertex without intravenous contrast. COMPARISON:  December 07, 2019 brain MRI FINDINGS: Brain: No evidence of acute territorial infarction, hemorrhage, hydrocephalus,extra-axial collection or mass lesion/mass effect. There is dilatation the ventricles and sulci consistent with age-related atrophy. Low-attenuation changes in the deep white matter consistent with small vessel ischemia. Vascular: No hyperdense vessel or unexpected calcification. Skull: The skull is intact. No fracture or focal lesion identified. Sinuses/Orbits: The visualized paranasal sinuses and mastoid air cells are clear. The orbits and globes intact. Other: Small laceration seen over the posterior left parietal skull. IMPRESSION: No acute intracranial abnormality. Findings consistent with age related atrophy and chronic small vessel ischemia Electronically Signed   By: Prudencio Pair M.D.   On: 12/12/2019 00:45   US RENAL  Result Date: 12/11/2019 CLINICAL DATA:  Retroperitoneal mass on CT, increasing creatinine EXAM:  RENAL / URINARY TRACT ULTRASOUND COMPLETE COMPARISON:  CT abdomen and pelvis 11/18/2019 FINDINGS: Right Kidney: Renal measurements: 11.9 x 7.9 x 7.9 cm = volume: 788 mL. Cortical thinning. Normal cortical echogenicity. Mild-to-moderate RIGHT hydronephrosis. Cystic lesion at upper pole 7.2 x 5.9 x 5.7 cm, simple features. Additional peripelvic/mid RIGHT renal cyst 5.7 x 4.3 x 4.0 cm. No solid masses or shadowing calculi. Left Kidney: Renal measurements: 12.1 x 6.3 x 6.9 cm = volume: 273 mL. Cortical thinning. Mild hydronephrosis. Minimally increased cortical echogenicity. Small mass at upper pole 2.8 x 3.2 x 2.3 cm, by prior CT exam appears represent a splenic lobulation rather than a renal lesion. Small exophytic cyst at inferior pole 2.1 x 1.9 x 1.6 cm. No additional renal mass. Bladder: Stent in urinary bladder.  Bladder poorly distended. Other:  Small LEFT pleural effusion. IMPRESSION: BILATERAL hydronephrosis, slightly greater on RIGHT. BILATERAL renal cysts larger on RIGHT. Small LEFT pleural effusion. Electronically Signed   By: Lavonia Dana M.D.   On: 12/11/2019 14:48   DG Chest Port 1 View  Result Date: 12/12/2019 CLINICAL DATA:  Pain status post fall EXAM: PORTABLE CHEST 1 VIEW COMPARISON:  08/01/2019 FINDINGS: There is a new moderate to large left-sided pleural effusion. There is a small right-sided pleural effusion. The heart size is relatively normal. Aortic calcifications are noted. There is no pneumothorax. There is no definite acute displaced fracture however evaluation of the left-sided ribs is limited by the large pleural effusion and osteopenia. The patient's known posterior mediastinal mass is better appreciated on prior CT. IMPRESSION: Moderate to large left-sided pleural effusion, new since prior study. Electronically Signed   By: Constance Holster M.D.   On: 12/12/2019 01:52   DG Foot 2 Views Left  Result Date: 12/12/2019 CLINICAL DATA:  Pain status post fall EXAM: LEFT FOOT - 2 VIEW COMPARISON:  None. FINDINGS: There is no evidence of fracture or dislocation. There is no evidence of arthropathy or other focal bone abnormality. There is mild soft tissue swelling about the dorsal aspect of the forefoot. Is a small plantar calcaneal spur. There are degenerative changes of the midfoot. IMPRESSION: No acute displaced fracture. Electronically Signed   By: Constance Holster M.D.   On: 12/12/2019 01:46   CT Renal Stone Study  Result Date: 12/12/2019 CLINICAL DATA:  Lymphoma.  Hydronephrosis. EXAM: CT ABDOMEN AND PELVIS WITHOUT CONTRAST TECHNIQUE: Multidetector CT imaging of the abdomen and pelvis was performed following the standard protocol without IV contrast. COMPARISON:  Ultrasound 12/11/2019. PET scan 12/07/2019. CT 11/17/2019. FINDINGS: Lower chest: Enlarging effusions left more than right with atelectasis in both lower lungs.  Lymphoma mass in the retrocrural space, increased in volume compared to the study of January 12th. Thickness of tumor in this region up to 3 cm. Previously this measured about 2 cm. Majority encasement of the aorta. Hepatobiliary: No visible liver parenchymal lesion. Previous cholecystectomy. Pancreas: Negative Spleen: Negative Adrenals/Urinary Tract: Left adrenal gland appears normal. Right adrenal gland difficult to separate from retroperitoneal mass. Left kidney shows a double-J ureteral stent in place. Mild fullness of the left renal collecting system. Renal stent an ureter passes along side a left-sided retroperitoneal mass which has enlarged since the previous study, measuring approximately 4 x 7 cm compared with 2 x 4 cm previously. Hydronephrosis of the right kidney again seen, with multiple cysts as well. Tumor infiltrating the region of the hilum has increased in volume. Bladder appears unremarkable, except for the left double-J ureteral stent entering the bladder. Stomach/Bowel:  No evidence of bowel obstruction. Mild edema adjacent to the proximal sigmoid colon could possibly represent low level diverticulitis. This is more pronounced than was seen previously. Vascular/Lymphatic: See above regarding extensive retroperitoneal lymphoma tissue. Reproductive: Previous hysterectomy. Other: No free fluid or air. Musculoskeletal: Subacute fractures of the spine as seen previously, not grossly progressive. No lytic lesion or pathologic fracture of the pelvic bones. IMPRESSION: Pleural effusions larger on the left than the right with dependent atelectasis. Increase in tumor volume in the retrocrural region and the left retroperitoneum at the abdominopelvic junction region. See above. Increase in amount of tumor in the right renal hilar region. Double-J ureteral stent in place on the left. Mild fullness of the left renal collecting system. Hydronephrosis of the right kidney, likely obstructed by the tumor in the  renal hilar region. Increased edema in the fat in the proximal sigmoid colon region suggests mild diverticulitis. Multiple lumbar compression fractures at not visibly progressed. Electronically Signed   By: Nelson Chimes M.D.   On: 12/12/2019 05:24   VAS [place] LOWER EXTREMITY VENOUS (DVT)  Result Date: 12/12/2019  Lower Venous DVT Study Indications: Edema.  Limitations: Body habitus and edema. Comparison Study: Prior Right Lower extremity venous duplex from 06/09/2018 is                   available for comparison Performing Technologist: Sharion Dove RVS  Examination Guidelines: A complete evaluation includes B-mode imaging, spectral Doppler, color Doppler, and power Doppler as needed of all accessible portions of each vessel. Bilateral testing is considered an integral part of a complete examination. Limited examinations for reoccurring indications may be performed as noted. The reflux portion of the exam is performed with the patient in reverse Trendelenburg.  +---------+---------------+---------+-----------+----------+-------------------+ RIGHT    CompressibilityPhasicitySpontaneityPropertiesThrombus Aging      +---------+---------------+---------+-----------+----------+-------------------+ CFV      Full           Yes      Yes                                      +---------+---------------+---------+-----------+----------+-------------------+ SFJ      Full                                                             +---------+---------------+---------+-----------+----------+-------------------+ FV Prox  Full                                                             +---------+---------------+---------+-----------+----------+-------------------+ FV Mid   Full                                                             +---------+---------------+---------+-----------+----------+-------------------+ FV Distal               Yes      Yes  patent by Color and                                                        Doppler             +---------+---------------+---------+-----------+----------+-------------------+ PFV      Full                                                             +---------+---------------+---------+-----------+----------+-------------------+ POP                     Yes      Yes                  patent by Color and                                                       Doppler             +---------+---------------+---------+-----------+----------+-------------------+ PTV      Full                                                             +---------+---------------+---------+-----------+----------+-------------------+ PERO     Full                                                             +---------+---------------+---------+-----------+----------+-------------------+   +---------+---------------+---------+-----------+----------+--------------+ LEFT     CompressibilityPhasicitySpontaneityPropertiesThrombus Aging +---------+---------------+---------+-----------+----------+--------------+ CFV      Full           Yes      Yes                                 +---------+---------------+---------+-----------+----------+--------------+ SFJ      Full                                                        +---------+---------------+---------+-----------+----------+--------------+ FV Prox  Full                                                        +---------+---------------+---------+-----------+----------+--------------+ FV Mid   Full                                                        +---------+---------------+---------+-----------+----------+--------------+  FV DistalFull                                                        +---------+---------------+---------+-----------+----------+--------------+ PFV      Full                                                         +---------+---------------+---------+-----------+----------+--------------+ POP      Full           Yes      Yes                                 +---------+---------------+---------+-----------+----------+--------------+ PTV      Full                                                        +---------+---------------+---------+-----------+----------+--------------+ PERO     Full                                                        +---------+---------------+---------+-----------+----------+--------------+     Summary: RIGHT: - Findings appear essentially unchanged compared to previous examination. - There is no evidence of deep vein thrombosis in the lower extremity. However, portions of this examination were limited- see technologist comments above.  LEFT: - There is no evidence of deep vein thrombosis in the lower extremity.  *See table(s) above for measurements and observations.    Preliminary    US THORACENTESIS ASP PLEURAL SPACE W/IMG GUIDE  Result Date: 12/12/2019 INDICATION: Patient with history of retroperitoneal malignant neoplasm was bilateral pleural effusion presents for therapeutic and diagnostic thoracentesis. EXAM: ULTRASOUND GUIDED THERAPEUTIC AND DIAGNOSTIC THORACENTESIS MEDICATIONS: Lidocaine 1% 10 mL COMPLICATIONS: None immediate. PROCEDURE: An ultrasound guided thoracentesis was thoroughly discussed with the patient and questions answered. The benefits, risks, alternatives and complications were also discussed. The patient understands and wishes to proceed with the procedure. Written consent was obtained. Ultrasound was performed to localize and mark an adequate pocket of fluid in the left-sided chest. The area was then prepped and draped in the normal sterile fashion. 1% Lidocaine was used for local anesthesia. Under ultrasound guidance a catheter was introduced. Thoracentesis was performed. The catheter was removed and a dressing applied. FINDINGS: A total of  approximately 650 mL of amber colored fluid was removed. Samples were sent to the laboratory as requested by the clinical team. IMPRESSION: Successful ultrasound guided therapeutic and diagnostic left-sided thoracentesis yielding 650 mL of pleural fluid. Read by Rushie Nyhan NP Electronically Signed   By: Corrie Mckusick D.O.   On: 12/12/2019 10:39        Scheduled Meds: . aspirin EC  81 mg Oral Daily  . atorvastatin  10 mg Oral Daily  . donepezil  5 mg Oral QHS  .  levothyroxine  75 mcg Oral Q0600  . pantoprazole  40 mg Oral BID   Continuous Infusions: . cefTRIAXone (ROCEPHIN)  IV 1 g (12/12/19 2313)  . sodium bicarbonate 150 mEq in dextrose 5% 1000 mL 150 mEq (12/13/19 0629)     LOS: 1 day    Time spent: 25 minutes    Barb Merino, MD [facility] Pager [phone]

## 2019-12-13 NOTE — Progress Notes (Signed)
OT Cancellation Note  Patient Details Name: Regina Harris MRN: [id number] DOB: [birth year]   Cancelled Treatment:    Reason Eval/Treat Not Completed: Other (comment) Pt just returned from floor after neph tube placement. Very fatigued with increased pain and requesting therapy return next date. Will continue to follow as available and appropriate.  Zenovia Jarred, MSOT, OTR/L Acute Rehabilitation Services [facility] Office Number: [phone]  Zenovia Jarred 12/13/2019, 5:21 PM

## 2019-12-13 NOTE — Consult Note (Signed)
[facility] Progress Note    Assessment/ Plan:   1. Acute kidney Injury: Obstruction from known lymphoma vs multiple myeloma is definitely a factor, however unclear if myeloma deposition in the kidney is contributing. CT consistent with progressively worsening left retroperitoneal and periaortic mass encasing the left ureter, as well as increased tumor size in right renal hilar region. Renal ultrasound notable for bilateral hydronephrosis (R>L). NM renal MAG3 study. CT guided biopsy of retroperitoneal mass and bone marrow scheduled. Cr increased from 1.76>2.0>3.38 over last few days. BUN 32. Electrolytes stable. Holding lasix per primary. Patient is hypervolemic on exam.  - urology/oncology following - agree that bilateral nephrostomy tube placement is an option pending studies   - may consider kidney biopsy however need to lower kidney pressure prior - recommend palliative care consult for [place] discussion - follow up imaging and biopsy  2. Right>Left sided plural effusion: s/p ultrasound guided diagnostic and therapeutic left pleural thoracenteses yesterday by IR - ~69m removed. Gram stain and gram stain negative. Cell count notable for elevation in unusual meso/mono nucleated cells.   3. Retroperitoneal Malignancy: follow up pleural cytology. Consider palliative consult for [place]. - per primary and oncology  HPI:   Regina Hockmanis a 84y.o. female presenting with acute kidney injury with elevation of Cr from newer baseline of 1.6 to 3.38 today. 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 She is currently not on any treatment for this. She was admitted to the hospital several weeks ago and a biopsy was nondiagnostic.  At that time she was noted to have worsening renal function with mild left hydroureteronephrosis. Cystoscopy and ureter stent was placed on  1/14 and the renal function improved some. [facility] She was also recently treated for a E. Coli UTI.   Per chart review, patient has had decline in kidney function over the past 4 months from 0.89 (11/20) > 1.76 (1/21). She was found to have Cr of 2.0 on 2/4 and thus was instructed to report to ED. Her creatine has continued to rise since admission 2.91>3.38.   Renal ultrasound on 2/5 notable for bilateral hydronephrosis (R>L), bilateral renal cysts (R>L), and small left pleural effusion.  CT renal stone on 2/6: increase in tumor size in retrocural region and left retroperitoneum at abdomeinopelvic junction, increase in amount of tumor in right renal hilar region, double J ureteral stent on left, mild fullness of left renal collecting system. Hydronephrosis of right kidney, likely obstructed by tumor in renal hilar region. Increased edema in fat in proximal sigmoid colon region suggestive of mild diverticulitis. Multiple lumbar compression fractures.   Objective:   BP (!) 158/54 (BP Location: Right Arm)   Pulse (!) 102   Temp 98.1 F (36.7 C) (Oral)   Resp 18   Ht 5' 7"  (1.702 m)   Wt 96.6 kg   SpO2 94%   BMI 33.36 kg/m   Intake/Output Summary (Last 24 hours) at 12/13/2019 1117 Last data filed at 12/13/2019 00263Gross per 24 hour  Intake 1663.04 ml  Output 725 ml  Net 938.04 ml   Weight change: 1.451 kg  Physical Exam: General: pleasant elderly female, lying comfortably in bed with daughter at bedside, in no acute distress with non-toxic appearance CV: regular rate and rhythm without murmurs, rubs, or gallops, 3+ pitting edema to mid shin Lungs:  scattered wheezes throughout, with normal work of breathing on room air, no crackles appreciated Abdomen: soft, non-tender Skin: warm, dry Extremities: warm and well perfused   Imaging: DG Chest 1 View  Result Date: 12/12/2019 CLINICAL DATA:  Status post LEFT thoracentesis. EXAM: CHEST  1  VIEW COMPARISON:  12/12/2019 FINDINGS: LEFT pleural effusion has decreased, now trace. LEFT basilar atelectasis has improved. A trace RIGHT pleural effusion may be present. There is no evidence of pneumothorax. IMPRESSION: Decreased LEFT pleural effusion, now trace, with improved LEFT basilar atelectasis. No evidence of pneumothorax. Electronically Signed   By: Margarette Canada M.D.   On: 12/12/2019 10:49   DG Knee 2 Views Left  Result Date: 12/12/2019 CLINICAL DATA:  Knee pain EXAM: LEFT KNEE - 1-2 VIEW COMPARISON:  None. FINDINGS: No fracture or dislocation. Tricompartmental osteoarthritis is noted with joint space loss and subchondral sclerosis. No large knee joint effusion. Patellar enthesopathy seen. Vascular calcifications are noted. IMPRESSION: No acute osseous abnormality. Electronically Signed   By: Prudencio Pair M.D.   On: 12/12/2019 03:35   DG Abd 1 View  Result Date: 12/11/2019 CLINICAL DATA:  Ureteral stent. Lower abdominal pain. EXAM: ABDOMEN - 1 VIEW COMPARISON:  November 17, 2019. FINDINGS: The bowel gas pattern is normal. Status post cholecystectomy. Left ureteral stent is noted in grossly good position. Phleboliths are noted in the pelvis. No nephrolithiasis is noted. IMPRESSION: Left ureteral stent is noted in grossly good position. No evidence of bowel obstruction or ileus. Electronically Signed   By: Marijo Conception M.D.   On: 12/11/2019 16:30   CT Head Wo Contrast  Result Date: 12/12/2019 CLINICAL DATA:  Fall weakness EXAM: CT HEAD WITHOUT CONTRAST TECHNIQUE: Contiguous axial images were obtained from the base of the skull through the vertex without intravenous contrast. COMPARISON:  December 07, 2019 brain MRI FINDINGS: Brain: No evidence of acute territorial infarction, hemorrhage, hydrocephalus,extra-axial collection or mass lesion/mass effect. There is dilatation the ventricles and sulci consistent with age-related atrophy. Low-attenuation changes in the deep white matter consistent with  small vessel ischemia. Vascular: No hyperdense vessel or unexpected calcification. Skull: The skull is intact. No fracture or focal lesion identified. Sinuses/Orbits: The visualized paranasal sinuses and mastoid air cells are clear. The orbits and globes intact. Other: Small laceration seen over the posterior left parietal skull. IMPRESSION: No acute intracranial abnormality. Findings consistent with age related atrophy and chronic small vessel ischemia Electronically Signed   By: Prudencio Pair M.D.   On: 12/12/2019 00:45   US RENAL  Result Date: 12/11/2019 CLINICAL DATA:  Retroperitoneal mass on CT, increasing creatinine EXAM: RENAL / URINARY TRACT ULTRASOUND COMPLETE COMPARISON:  CT abdomen and pelvis 11/18/2019 FINDINGS: Right Kidney: Renal measurements: 11.9 x 7.9 x 7.9 cm = volume: 788 mL. Cortical thinning. Normal cortical echogenicity. Mild-to-moderate RIGHT hydronephrosis. Cystic lesion at upper pole 7.2 x 5.9 x 5.7 cm, simple features. Additional peripelvic/mid RIGHT renal cyst 5.7 x 4.3 x 4.0 cm. No solid masses or shadowing calculi. Left Kidney: Renal measurements: 12.1 x 6.3 x 6.9 cm = volume: 273 mL. Cortical thinning. Mild hydronephrosis. Minimally increased cortical echogenicity. Small mass at upper pole 2.8 x 3.2 x 2.3 cm, by prior CT exam appears represent a splenic lobulation rather than a renal lesion. Small exophytic cyst at inferior pole 2.1 x 1.9 x 1.6 cm. No additional renal mass. Bladder: Stent in urinary bladder.  Bladder poorly distended. Other: Small LEFT pleural effusion. IMPRESSION: BILATERAL hydronephrosis, slightly greater on RIGHT. BILATERAL renal cysts larger  on RIGHT. Small LEFT pleural effusion. Electronically Signed   By: Lavonia Dana M.D.   On: 12/11/2019 14:48   DG Chest Port 1 View  Result Date: 12/12/2019 CLINICAL DATA:  Pain status post fall EXAM: PORTABLE CHEST 1 VIEW COMPARISON:  08/01/2019 FINDINGS: There is a new moderate to large left-sided pleural effusion. There is  a small right-sided pleural effusion. The heart size is relatively normal. Aortic calcifications are noted. There is no pneumothorax. There is no definite acute displaced fracture however evaluation of the left-sided ribs is limited by the large pleural effusion and osteopenia. The patient's known posterior mediastinal mass is better appreciated on prior CT. IMPRESSION: Moderate to large left-sided pleural effusion, new since prior study. Electronically Signed   By: Constance Holster M.D.   On: 12/12/2019 01:52   DG Foot 2 Views Left  Result Date: 12/12/2019 CLINICAL DATA:  Pain status post fall EXAM: LEFT FOOT - 2 VIEW COMPARISON:  None. FINDINGS: There is no evidence of fracture or dislocation. There is no evidence of arthropathy or other focal bone abnormality. There is mild soft tissue swelling about the dorsal aspect of the forefoot. Is a small plantar calcaneal spur. There are degenerative changes of the midfoot. IMPRESSION: No acute displaced fracture. Electronically Signed   By: Constance Holster M.D.   On: 12/12/2019 01:46   CT Renal Stone Study  Result Date: 12/12/2019 CLINICAL DATA:  Lymphoma.  Hydronephrosis. EXAM: CT ABDOMEN AND PELVIS WITHOUT CONTRAST TECHNIQUE: Multidetector CT imaging of the abdomen and pelvis was performed following the standard protocol without IV contrast. COMPARISON:  Ultrasound 12/11/2019. PET scan 12/07/2019. CT 11/17/2019. FINDINGS: Lower chest: Enlarging effusions left more than right with atelectasis in both lower lungs. Lymphoma mass in the retrocrural space, increased in volume compared to the study of January 12th. Thickness of tumor in this region up to 3 cm. Previously this measured about 2 cm. Majority encasement of the aorta. Hepatobiliary: No visible liver parenchymal lesion. Previous cholecystectomy. Pancreas: Negative Spleen: Negative Adrenals/Urinary Tract: Left adrenal gland appears normal. Right adrenal gland difficult to separate from retroperitoneal  mass. Left kidney shows a double-J ureteral stent in place. Mild fullness of the left renal collecting system. Renal stent an ureter passes along side a left-sided retroperitoneal mass which has enlarged since the previous study, measuring approximately 4 x 7 cm compared with 2 x 4 cm previously. Hydronephrosis of the right kidney again seen, with multiple cysts as well. Tumor infiltrating the region of the hilum has increased in volume. Bladder appears unremarkable, except for the left double-J ureteral stent entering the bladder. Stomach/Bowel: No evidence of bowel obstruction. Mild edema adjacent to the proximal sigmoid colon could possibly represent low level diverticulitis. This is more pronounced than was seen previously. Vascular/Lymphatic: See above regarding extensive retroperitoneal lymphoma tissue. Reproductive: Previous hysterectomy. Other: No free fluid or air. Musculoskeletal: Subacute fractures of the spine as seen previously, not grossly progressive. No lytic lesion or pathologic fracture of the pelvic bones. IMPRESSION: Pleural effusions larger on the left than the right with dependent atelectasis. Increase in tumor volume in the retrocrural region and the left retroperitoneum at the abdominopelvic junction region. See above. Increase in amount of tumor in the right renal hilar region. Double-J ureteral stent in place on the left. Mild fullness of the left renal collecting system. Hydronephrosis of the right kidney, likely obstructed by the tumor in the renal hilar region. Increased edema in the fat in the proximal sigmoid colon region suggests mild diverticulitis.  Multiple lumbar compression fractures at not visibly progressed. Electronically Signed   By: Nelson Chimes M.D.   On: 12/12/2019 05:24   VAS [place] LOWER EXTREMITY VENOUS (DVT)  Result Date: 12/12/2019  Lower Venous DVT Study Indications: Edema.  Limitations: Body habitus and edema. Comparison Study: Prior Right Lower extremity venous  duplex from 06/09/2018 is                   available for comparison Performing Technologist: Sharion Dove RVS  Examination Guidelines: A complete evaluation includes B-mode imaging, spectral Doppler, color Doppler, and power Doppler as needed of all accessible portions of each vessel. Bilateral testing is considered an integral part of a complete examination. Limited examinations for reoccurring indications may be performed as noted. The reflux portion of the exam is performed with the patient in reverse Trendelenburg.  +---------+---------------+---------+-----------+----------+-------------------+ RIGHT    CompressibilityPhasicitySpontaneityPropertiesThrombus Aging      +---------+---------------+---------+-----------+----------+-------------------+ CFV      Full           Yes      Yes                                      +---------+---------------+---------+-----------+----------+-------------------+ SFJ      Full                                                             +---------+---------------+---------+-----------+----------+-------------------+ FV Prox  Full                                                             +---------+---------------+---------+-----------+----------+-------------------+ FV Mid   Full                                                             +---------+---------------+---------+-----------+----------+-------------------+ FV Distal               Yes      Yes                  patent by Color and                                                       Doppler             +---------+---------------+---------+-----------+----------+-------------------+ PFV      Full                                                             +---------+---------------+---------+-----------+----------+-------------------+  POP                     Yes      Yes                  patent by Color and                                                        Doppler             +---------+---------------+---------+-----------+----------+-------------------+ PTV      Full                                                             +---------+---------------+---------+-----------+----------+-------------------+ PERO     Full                                                             +---------+---------------+---------+-----------+----------+-------------------+   +---------+---------------+---------+-----------+----------+--------------+ LEFT     CompressibilityPhasicitySpontaneityPropertiesThrombus Aging +---------+---------------+---------+-----------+----------+--------------+ CFV      Full           Yes      Yes                                 +---------+---------------+---------+-----------+----------+--------------+ SFJ      Full                                                        +---------+---------------+---------+-----------+----------+--------------+ FV Prox  Full                                                        +---------+---------------+---------+-----------+----------+--------------+ FV Mid   Full                                                        +---------+---------------+---------+-----------+----------+--------------+ FV DistalFull                                                        +---------+---------------+---------+-----------+----------+--------------+ PFV      Full                                                        +---------+---------------+---------+-----------+----------+--------------+  POP      Full           Yes      Yes                                 +---------+---------------+---------+-----------+----------+--------------+ PTV      Full                                                        +---------+---------------+---------+-----------+----------+--------------+ PERO     Full                                                         +---------+---------------+---------+-----------+----------+--------------+     Summary: RIGHT: - Findings appear essentially unchanged compared to previous examination. - There is no evidence of deep vein thrombosis in the lower extremity. However, portions of this examination were limited- see technologist comments above.  LEFT: - There is no evidence of deep vein thrombosis in the lower extremity.  *See table(s) above for measurements and observations.    Preliminary    US THORACENTESIS ASP PLEURAL SPACE W/IMG GUIDE  Result Date: 12/12/2019 INDICATION: Patient with history of retroperitoneal malignant neoplasm was bilateral pleural effusion presents for therapeutic and diagnostic thoracentesis. EXAM: ULTRASOUND GUIDED THERAPEUTIC AND DIAGNOSTIC THORACENTESIS MEDICATIONS: Lidocaine 1% 10 mL COMPLICATIONS: None immediate. PROCEDURE: An ultrasound guided thoracentesis was thoroughly discussed with the patient and questions answered. The benefits, risks, alternatives and complications were also discussed. The patient understands and wishes to proceed with the procedure. Written consent was obtained. Ultrasound was performed to localize and mark an adequate pocket of fluid in the left-sided chest. The area was then prepped and draped in the normal sterile fashion. 1% Lidocaine was used for local anesthesia. Under ultrasound guidance a catheter was introduced. Thoracentesis was performed. The catheter was removed and a dressing applied. FINDINGS: A total of approximately 650 mL of amber colored fluid was removed. Samples were sent to the laboratory as requested by the clinical team. IMPRESSION: Successful ultrasound guided therapeutic and diagnostic left-sided thoracentesis yielding 650 mL of pleural fluid. Read by Rushie Nyhan NP Electronically Signed   By: Corrie Mckusick D.O.   On: 12/12/2019 10:39    Labs: BMET Recent Labs  Lab 12/10/19 1422 12/12/19 0048 12/13/19 0633  NA 142 143 142   K 4.4 4.7 4.2  CL 113* 113* 113*  CO2 18* 16* 17*  GLUCOSE 101* 93 101*  BUN 30* 32* 32*  CREATININE 2.00* 2.91* 3.38*  CALCIUM 9.0 9.4 8.5*   CBC Recent Labs  Lab 12/10/19 1422 12/12/19 0048 12/13/19 0633  WBC 4.4 5.4 4.0  NEUTROABS 2.8 3.9 2.4  HGB 11.8* 13.1 11.5*  HCT 40.1 42.0 36.2  MCV 103.9* 98.8 97.6  PLT 168 170 131*    Medications:    . aspirin EC  81 mg Oral Daily  . atorvastatin  10 mg Oral Daily  . donepezil  5 mg Oral QHS  . levothyroxine  75 mcg Oral Q0600  . pantoprazole  40 mg Oral BID     Mina Marble, DO [facility], PGY2 12/13/2019, 11:17 AM

## 2019-12-13 NOTE — Progress Notes (Signed)
During exam today, daughter Regina Harris) specifies that her and patient granddaughter Regina Harris are [facility]. Patient and [facility] specify that she is supposed to be DNR. Will change this in chart per family request.

## 2019-12-13 NOTE — Progress Notes (Signed)
PT Cancellation Note  Patient Details Name: Regina Harris MRN: [id number] DOB: Dec 21, 1935   Cancelled Treatment:    Reason Eval/Treat Not Completed: Patient at procedure or test/unavailable. Pt off floor for nephrostomy tube placement. PT to re-attempt as time allows.   Lorriane Shire 12/13/2019, 1:29 PM   Lorrin Goodell, PT  Office # [phone] Pager [phone]

## 2019-12-13 NOTE — Procedures (Signed)
Interventional Radiology Procedure Note  Procedure: Left PCN placement.  63F drain. .  Complications: None  Recommendations:  - To gravity - Do not submerge - Routine drain care   Signed,  Dulcy Fanny. Earleen Newport, DO

## 2019-12-13 NOTE — Consult Note (Signed)
Chief Complaint: Patient was seen in consultation today for  Hydronephrosis acute renal failure  Referring Physician(s): Dr. Louis Meckel  Supervising Physician: Corrie Mckusick  Patient Status: [facility] - In-pt  History of Present Illness: Regina Harris is a 84 y.o. 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 M renal study shows left sided hydronephrosis with a sided sided kidney with cystic changes.  Team is requesting nephrostomy tube placement - Left.  Past Medical History:  Diagnosis Date  . Barrett esophagus   . Carotid bruit   . Diabetes mellitus without complication ([place])   . Esophagitis   . Gastritis 2013  . GERD (gastroesophageal reflux disease)   . HH (hiatus hernia)   . History of colonic polyps   . History of repair of right rotator cuff   . Hyperlipidemia   . Hypertension   . Hyperthyroidism 06/09/2018   Per [facility] RAI Therapy for Hyperthyroidism order  . Lump or mass in breast   . Pneumonia   . Polycythemia, secondary   . Tobacco abuse     Past Surgical History:  Procedure Laterality Date  . ABDOMINAL HYSTERECTOMY    . APPENDECTOMY    . BREAST BIOPSY Right 1996  . BREAST BIOPSY Left 10/09/2012   Benign breast tissue with focal fat necrosis and focal periductal chronic inflammation.  Marland Kitchen BREAST BIOPSY Left 10/09/2012   Benign breast tissue with focal fat necrosis and focal periductal chronic inflammation.  Marland Kitchen CATARACT EXTRACTION  3/11   Dr Charise Killian  . CHOLECYSTECTOMY    . COLONOSCOPY  2009  . CYSTOSCOPY WITH URETEROSCOPY AND STENT PLACEMENT Left 11/19/2019   Procedure: CYSTOSCOPY WITH URETEROSCOPY AND STENT PLACEMENT;  Surgeon: Billey Co, MD;  Location: [facility] ORS;  Service: Urology;  Laterality: Left;  . ESOPHAGOGASTRODUODENOSCOPY (EGD) WITH PROPOFOL N/A 11/02/2015   Procedure: ESOPHAGOGASTRODUODENOSCOPY (EGD) WITH PROPOFOL;   Surgeon: Manya Silvas, MD;  Location: [facility] ENDOSCOPY;  Service: Endoscopy;  Laterality: N/A;  . SHOULDER ARTHROSCOPY WITH OPEN ROTATOR CUFF REPAIR Right 03/05/2016   Procedure: SHOULDER ARTHROSCOPY WITH OPEN ROTATOR CUFF REPAIR;  Surgeon: Earnestine Leys, MD;  Location: [facility] ORS;  Service: Orthopedics;  Laterality: Right;  . UPPER GI ENDOSCOPY  2013    Allergies: Benadryl [diphenhydramine]  Medications: Prior to Admission medications   Medication Sig Start Date End Date Taking? Authorizing Provider  acetaminophen (TYLENOL) 500 MG tablet Take 2 tablets (1,000 mg total) by mouth 3 (three) times daily. 11/20/19  Yes Danford, Suann Larry, MD  aspirin EC 81 MG tablet Take 81 mg by mouth daily.   Yes [provider]  atorvastatin (LIPITOR) 10 MG tablet TAKE 1 TABLET BY MOUTH ONCE DAILY Patient taking differently: Take 10 mg by mouth daily.  08/04/19  Yes Tower, Wynelle Fanny, MD  Calcium Carb-Cholecalciferol (CALCIUM 1000 + D) 1000-800 MG-UNIT TABS Take 1 tablet by mouth 2 (two) times daily.    Yes [provider]  cyanocobalamin 1000 MCG tablet Take 1,000 mcg by mouth daily.   Yes [provider]  donepezil (ARICEPT) 5 MG tablet TAKE 1 TABLET BY MOUTH AT BEDTIME Patient taking differently: Take 5 mg by mouth at bedtime.  11/30/19  Yes Tower, Wynelle Fanny, MD  furosemide (LASIX) 20 MG tablet TAKE 1 TABLET BY MOUTH ONCE A DAY Patient taking differently: Take 20 mg by mouth daily.  07/22/19  Yes Tower, Wynelle Fanny, MD  gabapentin (NEURONTIN) 400 MG capsule TAKE 1 CAPSULE BY MOUTH TWICE DAILY Patient taking differently: Take 400 mg by mouth 2 (two) times daily.  08/04/19  Yes Tower, Wynelle Fanny, MD  levothyroxine (SYNTHROID) 75 MCG tablet Take 75 mcg by mouth daily with breakfast.  07/21/19  Yes [provider]  megestrol (MEGACE) 40 MG tablet Take 1 tablet (40 mg total) by mouth daily. 11/27/19  Yes Lloyd Huger, MD  omeprazole (PRILOSEC) 20 MG capsule TAKE 1 CAPSULE BY MOUTH  TWICE DAILY Patient taking differently: Take 20 mg by mouth 2 (two) times daily.  07/22/19  Yes Tower, Wynelle Fanny, MD  potassium chloride SA (K-DUR) 20 MEQ tablet TAKE 1 TABLET BY MOUTH TWICE (2) DAILY Patient taking differently: Take 20 mEq by mouth 2 (two) times daily.  08/04/19  Yes Tower, Wynelle Fanny, MD  traMADol (ULTRAM) 50 MG tablet Take 1 tablet (50 mg total) by mouth every 6 (six) hours as needed. Patient taking differently: Take 50 mg by mouth every 6 (six) hours as needed for moderate pain.  12/10/19  Yes Lloyd Huger, MD  zinc sulfate 220 (50 Zn) MG capsule Take 1 capsule (220 mg total) by mouth daily. 08/07/19  Yes Gherghe, Vella Redhead, MD  potassium chloride (K-DUR) 10 MEQ tablet Take 1 tablet (10 mEq total) by mouth daily. 01/04/14 05/02/14  Tower, Wynelle Fanny, MD     Family History  Problem Relation Age of Onset  . Kidney cancer Father   . Diabetes Mother   . Breast cancer Neg Hx     Social History   Socioeconomic History  . Marital status: Widowed    Spouse name: Not on file  . Number of children: Not on file  . Years of education: Not on file  . Highest education level: Not on file  Occupational History  . Not on file  Tobacco Use  . Smoking status: Former Smoker    Packs/day: 1.00    Types: Cigarettes  . Smokeless tobacco: Never Used  Substance and Sexual Activity  . Alcohol use: No    Alcohol/week: 0.0 standard drinks  . Drug use: No  . Sexual activity: Never  Other Topics Concern  . Not on file  Social History Narrative  . Not on file   Social Determinants of Health   Financial Resource Strain:   . Difficulty of Paying Living Expenses: Not on file  Food Insecurity:   . Worried About Charity fundraiser in the Last Year: Not on file  . Ran Out of Food in the Last Year: Not on file  Transportation Needs:   . Lack of Transportation (Medical): Not on file  . Lack of Transportation (Non-Medical): Not on file  Physical Activity:   . Days of Exercise per Week: Not  on file  . Minutes of Exercise per Session: Not on file  Stress:   . Feeling of Stress : Not on file  Social Connections:   . Frequency of Communication with Friends and Family: Not on file  . Frequency of Social Gatherings with Friends and Family: Not on file  . Attends Religious Services: Not on file  . Active Member of Clubs or Organizations: Not on file  . Attends Archivist Meetings: Not on file  . Marital Status: Not on file     Review of Systems: A 12 point ROS discussed and pertinent positives are indicated in the HPI above.  All  other systems are negative.  Review of Systems  Constitutional: Negative for fatigue and fever.  HENT: Negative for congestion.   Respiratory: Negative for cough and shortness of breath.   Gastrointestinal: Negative for abdominal pain, diarrhea, nausea and vomiting.    Vital Signs: BP (!) 201/115 (BP Location: Left Arm)   Pulse (!) 117   Temp 98.1 F (36.7 C) (Oral)   Resp 15   Ht '5\' 7"'$  (1.702 m)   Wt 213 lb (96.6 kg)   SpO2 98%   BMI 33.36 kg/m   Physical Exam Vitals and nursing note reviewed.  Constitutional:      Appearance: She is well-developed.  HENT:     Head: Normocephalic and atraumatic.  Eyes:     Conjunctiva/sclera: Conjunctivae normal.  Cardiovascular:     Rate and Rhythm: Normal rate and regular rhythm.  Pulmonary:     Effort: Pulmonary effort is normal.     Breath sounds: Normal breath sounds.  Musculoskeletal:        General: Normal range of motion.     Cervical back: Normal range of motion.  Skin:    General: Skin is warm.     Comments: drain site is unremarkable with no erythema, tenderness or drainage noted. Suture and stat lock in place. Dressing is clean dry and intact. 1 ml of  flush colored fluid noted in the line of  bulb suction device. Drain removed at bedside intact. MD assisted at bedside due to retained suture from locking mechanism.    Neurological:     Mental Status: She is alert and  oriented to person, place, and time.     Imaging: CT ABDOMEN PELVIS WO CONTRAST  Result Date: 11/17/2019 CLINICAL DATA:  84 year old female with left-sided abdominal pain. Concern for acute diverticulitis. EXAM: CT ABDOMEN AND PELVIS WITHOUT CONTRAST TECHNIQUE: Multidetector CT imaging of the abdomen and pelvis was performed following the standard protocol without IV contrast. COMPARISON:  CT abdomen pelvis dated 06/13/2006. FINDINGS: Evaluation of this exam is limited in the absence of intravenous contrast. Lower chest: Trace left pleural effusion. There is thickened appearance of the posteromedial lower lobe pleural surfaces. Partially visualized infiltrative mass encasing the distal descending thoracic aorta and abutting the lower thoracic spine at T10-T11. This may represent an infiltrative neoplasm, lymphoma, or fibrosis versus sequela osteomyelitis/discitis. This is new since the CT of 07/17/2010. Further evaluation with MRI without and with contrast is recommended. Bibasilar subpleural linear and streaky densities may represent atelectasis/scarring. Infiltrate is less likely. Clinical correlation is recommended. There is no intra-abdominal free air or free fluid. Hepatobiliary: The liver is unremarkable. No intrahepatic biliary ductal dilatation. Cholecystectomy. No retained calcified stone noted in the central CBD. Pancreas: There is mild haziness of the distal pancreas which may represent acute pancreatitis. Correlation with pancreatic enzymes recommended. No drainable fluid collection/abscess or pseudocyst Spleen: Normal in size without focal abnormality. Adrenals/Urinary Tract: There is a 2 cm left adrenal adenoma. The right adrenal gland is unremarkable. There is mild left hydronephrosis. No calcified stone identified. There is haziness and stranding of the proximal left periureteric fat which may represent an infection/inflammatory process or fibrosis and possible degree of ureteral stricture.  There is a 2 cm left renal inferior pole hypodense lesion which is not characterized but demonstrates fluid attenuation most consistent with a cyst. There is no hydronephrosis or nephrolithiasis on the right. Multiple right renal cysts measure up to approximately 7 cm. The right ureter and urinary bladder appear unremarkable. Stomach/Bowel: There is  sigmoid diverticulosis with muscular hypertrophy. Mild perisigmoid stranding and haziness may represent mild acute diverticulitis. Clinical correlation is recommended. There is diffuse thickened appearance of the colon which may be related to underdistention. Diffuse submucosal fat deposit along the colonic wall likely sequela of chronic inflammatory process. There is no bowel obstruction. Appendectomy. Vascular/Lymphatic: Advanced aortoiliac atherosclerotic disease. There is stranding of the periaortic fat. There is a 3.8 x 2.1 cm left para-aortic/paraspinal soft tissue lesion which is not characterized but may represent enlarged lymph node (series 2 image 38). However, this soft tissue appears to abut the left L4 vertebra and therefore a neoplasm or an infectious process is not excluded. No portal venous gas. The IVC is unremarkable. Reproductive: Hysterectomy. Other: None Musculoskeletal: Osteopenia with extensive multilevel degenerative changes, disc desiccation and vacuum phenomena. Old-appearing multilevel compression fractures most prominent involving L3 and L4. No definite acute fracture. No retropulsed fragment. IMPRESSION: 1. Sigmoid diverticulosis with findings concerning for mild diverticulitis. No diverticular abscess or perforation. 2. No bowel obstruction. 3. Mild left hydronephrosis, possibly secondary to a degree of stricture or decreased peristalsis of the left ureter due to adjacent inflammatory process or fibrosis. No stone. 4. Mild stranding of the distal pancreas. Correlation with pancreatic enzymes recommended to evaluate for acute pancreatitis. No  fluid collection. 5. Infiltrative soft tissue mass along the lower thoracic spine at T10-T11 with encasement of the aorta. This may represent an inflammatory/infectious process (osteomyelitis/discitis) or an infiltrative neoplasm. Further evaluation with MRI without and with contrast is recommended. 6. Thickened appearance of the inferior medial lower lobe pleural surfaces which may be secondary to infiltration of paraspinal/periaortic mass or sequela of chronic inflammation. 7. Left para-aortic soft tissue density to the left of L4 may represent adenopathy or a neoplasm. Electronically Signed   By: Anner Crete M.D.   On: 11/17/2019 18:09   DG Chest 1 View  Result Date: 12/12/2019 CLINICAL DATA:  Status post LEFT thoracentesis. EXAM: CHEST  1 VIEW COMPARISON:  12/12/2019 FINDINGS: LEFT pleural effusion has decreased, now trace. LEFT basilar atelectasis has improved. A trace RIGHT pleural effusion may be present. There is no evidence of pneumothorax. IMPRESSION: Decreased LEFT pleural effusion, now trace, with improved LEFT basilar atelectasis. No evidence of pneumothorax. Electronically Signed   By: Margarette Canada M.D.   On: 12/12/2019 10:49   DG Knee 2 Views Left  Result Date: 12/12/2019 CLINICAL DATA:  Knee pain EXAM: LEFT KNEE - 1-2 VIEW COMPARISON:  None. FINDINGS: No fracture or dislocation. Tricompartmental osteoarthritis is noted with joint space loss and subchondral sclerosis. No large knee joint effusion. Patellar enthesopathy seen. Vascular calcifications are noted. IMPRESSION: No acute osseous abnormality. Electronically Signed   By: Prudencio Pair M.D.   On: 12/12/2019 03:35   DG Abd 1 View  Result Date: 12/11/2019 CLINICAL DATA:  Ureteral stent. Lower abdominal pain. EXAM: ABDOMEN - 1 VIEW COMPARISON:  November 17, 2019. FINDINGS: The bowel gas pattern is normal. Status post cholecystectomy. Left ureteral stent is noted in grossly good position. Phleboliths are noted in the pelvis. No  nephrolithiasis is noted. IMPRESSION: Left ureteral stent is noted in grossly good position. No evidence of bowel obstruction or ileus. Electronically Signed   By: Marijo Conception M.D.   On: 12/11/2019 16:30   CT Head Wo Contrast  Result Date: 12/12/2019 CLINICAL DATA:  Fall weakness EXAM: CT HEAD WITHOUT CONTRAST TECHNIQUE: Contiguous axial images were obtained from the base of the skull through the vertex without intravenous contrast. COMPARISON:  December 07, 2019 brain MRI FINDINGS: Brain: No evidence of acute territorial infarction, hemorrhage, hydrocephalus,extra-axial collection or mass lesion/mass effect. There is dilatation the ventricles and sulci consistent with age-related atrophy. Low-attenuation changes in the deep white matter consistent with small vessel ischemia. Vascular: No hyperdense vessel or unexpected calcification. Skull: The skull is intact. No fracture or focal lesion identified. Sinuses/Orbits: The visualized paranasal sinuses and mastoid air cells are clear. The orbits and globes intact. Other: Small laceration seen over the posterior left parietal skull. IMPRESSION: No acute intracranial abnormality. Findings consistent with age related atrophy and chronic small vessel ischemia Electronically Signed   By: Prudencio Pair M.D.   On: 12/12/2019 00:45   CT CHEST WO CONTRAST  Result Date: 11/18/2019 CLINICAL DATA:  Adenopathy EXAM: CT CHEST WITHOUT CONTRAST TECHNIQUE: Multidetector CT imaging of the chest was performed following the standard protocol without IV contrast. COMPARISON:  Chest CT 07/17/2010 FINDINGS: Cardiovascular: Normal heart size. No pericardial effusion. Aortic and coronary atherosclerosis. Mediastinum/Nodes: Infiltrative posterior mediastinal mass partially encasing the descending aorta with indistinguishable plane between the mass and the posterior wall of the aorta separate from areas of intimal calcification. The infiltrative abnormality extends approximately 9 cm  craniocaudal and 5.6 cm in diameter. The hila and other mediastinal spaces are negative for adenopathy. Lungs/Pleura: Small pleural effusions on both sides. There is subpleural extension of the mass with no visible nodularity along the pleural surface, although limited without contrast. Dependent atelectasis. No worrisome pulmonary nodules. Upper Abdomen: Partial coverage shows left hydronephrosis likely due to the retroperitoneal mass medial to the left psoas on prior CT. Right renal cystic change and atherosclerotic calcification. Cholecystectomy. Musculoskeletal: The posterior mediastinal mass is in close proximity to the spine but does not cause visible erosion or infiltrative lucency. No gross spinal canal mass. No evidence of hematogenous osseous metastases. IMPRESSION: 1. Approximately 9 x 6 cm infiltrative posterior mediastinal mass intimately associated with the descending aorta and thoracic spine. 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 2. Known left hydronephrosis related to infiltrative mass in the retroperitoneum. 3. Small pleural effusions with lower lobe atelectasis. Electronically Signed   By: Monte Fantasia M.D.   On: 11/18/2019 11:27   CT GUIDED NEEDLE PLACEMENT  Result Date: 11/20/2019 INDICATION: No known primary, now with retroperitoneal mass worrisome for lymphoma. Please perform CT-guided biopsy for tissue diagnostic purposes. EXAM: CT GUIDANCE NEEDLE PLACEMENT COMPARISON:  CT abdomen and pelvis-09/16/2020 MEDICATIONS: None. ANESTHESIA/SEDATION: Fentanyl 50 mcg IV; Versed 1 mg IV Sedation time: 16 minutes; The patient was continuously monitored during the procedure by the interventional radiology nurse under my direct supervision. CONTRAST:  None. COMPLICATIONS: None immediate. PROCEDURE: Informed consent was obtained from the patient following an explanation of the procedure, risks, benefits and alternatives. A time out was performed prior to the initiation of the procedure. The patient was positioned  prone on the CT table and a limited CT was performed for procedural planning demonstrating unchanged size and appearance the left-sided retroperitoneal mass, presumed nodal conglomeration with dominant component measuring approximately 4.5 x 2.6 cm (image 21, series 3). The procedure was planned. The operative site was prepped and draped in the usual sterile fashion. Appropriate trajectory was confirmed with a 22 gauge spinal needle after the adjacent tissues were anesthetized with 1% Lidocaine with epinephrine. Under intermittent CT guidance, a 17 gauge coaxial needle was advanced into the peripheral aspect of the mass. Appropriate positioning was confirmed and 6 core needle biopsy samples were obtained with an 18 gauge core needle biopsy device. The co-axial needle  was removed following the administration of a Gel-Foam slurry and superficial hemostasis was achieved with manual compression. A limited postprocedural CT was negative for hemorrhage or additional complication. A dressing was placed. The patient tolerated the procedure well without immediate postprocedural complication. IMPRESSION: Technically successful CT guided core needle biopsy of left-sided retroperitoneal mass, presumed nodal conglomeration. Electronically Signed   By: Sandi Mariscal M.D.   On: 11/20/2019 09:43   MR Brain W Wo Contrast  Result Date: 12/07/2019 CLINICAL DATA:  Lymphoma. EXAM: MRI HEAD WITHOUT AND WITH CONTRAST TECHNIQUE: Multiplanar, multiecho pulse sequences of the brain and surrounding structures were obtained without and with intravenous contrast. CONTRAST:  54m GADAVIST GADOBUTROL 1 MMOL/ML IV SOLN COMPARISON:  Head CT 07/29/2019 FINDINGS: The study is moderately motion degraded. Brain: There is no evidence of acute infarct, intracranial hemorrhage, mass, midline shift, or extra-axial fluid collection. There is mild to moderate cerebral atrophy. T2 hyperintensities in the cerebral white matter bilaterally are nonspecific but  compatible with mild chronic small vessel ischemic disease. No enhancing lesions are identified, however note that small lesions could be easily obscured by the degree of motion artifact. Vascular: Major intracranial vascular flow voids are preserved. Skull and upper cervical spine: No gross skull lesion identified. Sinuses/Orbits: Bilateral cataract extraction. Mild right sphenoid sinus mucosal thickening. Small left mastoid effusion. Other: None. IMPRESSION: 1. Motion degraded examination without evidence of intracranial metastases. 2. Mild chronic small vessel ischemic disease. Electronically Signed   By: ALogan BoresM.D.   On: 12/07/2019 15:52   [place]RENAL  Result Date: 12/11/2019 CLINICAL DATA:  Retroperitoneal mass on CT, increasing creatinine EXAM: RENAL / URINARY TRACT ULTRASOUND COMPLETE COMPARISON:  CT abdomen and pelvis 11/18/2019 FINDINGS: Right Kidney: Renal measurements: 11.9 x 7.9 x 7.9 cm = volume: 788 mL. Cortical thinning. Normal cortical echogenicity. Mild-to-moderate RIGHT hydronephrosis. Cystic lesion at upper pole 7.2 x 5.9 x 5.7 cm, simple features. Additional peripelvic/mid RIGHT renal cyst 5.7 x 4.3 x 4.0 cm. No solid masses or shadowing calculi. Left Kidney: Renal measurements: 12.1 x 6.3 x 6.9 cm = volume: 273 mL. Cortical thinning. Mild hydronephrosis. Minimally increased cortical echogenicity. Small mass at upper pole 2.8 x 3.2 x 2.3 cm, by prior CT exam appears represent a splenic lobulation rather than a renal lesion. Small exophytic cyst at inferior pole 2.1 x 1.9 x 1.6 cm. No additional renal mass. Bladder: Stent in urinary bladder.  Bladder poorly distended. Other: Small LEFT pleural effusion. IMPRESSION: BILATERAL hydronephrosis, slightly greater on RIGHT. BILATERAL renal cysts larger on RIGHT. Small LEFT pleural effusion. Electronically Signed   By: MLavonia DanaM.D.   On: 12/11/2019 14:48   NM PET Image Initial (PI) Skull Base To Thigh  Result Date: 12/07/2019 CLINICAL DATA:   Initial treatment strategy for lymphoma. EXAM: NUCLEAR MEDICINE PET SKULL BASE TO THIGH TECHNIQUE: 11.8 mCi F-18 FDG was injected intravenously. Full-ring PET imaging was performed from the skull base to thigh after the radiotracer. CT data was obtained and used for attenuation correction and anatomic localization. Fasting blood glucose: 81 mg/dl COMPARISON:  CT chest abdomen and pelvis 11/18/2019 FINDINGS: Mediastinal blood pool activity: SUV max 2.6 Liver activity: SUV max 3.14. NECK: No hypermetabolic lymph nodes in the neck. Incidental CT findings: none CHEST: Paraspinal soft tissue mass within the posterior mediastinum measures 7.3 x 6.9 cm with SUV max of 3.59. Masslike architectural distortion within the lingula is identified, nonspecific measuring 3.9 x 2.2 cm with SUV max 2.48. There are bilateral pleural effusions, left  greater than right. Increased tracer uptake within the left pleural effusion has an SUV max of 2.25. Cannot exclude malignant effusion. Incidental CT findings: Aortic atherosclerosis. Lad, left circumflex and RCA coronary artery calcifications identified. ABDOMEN/PELVIS: Within the left retroperitoneum extending into the paraspinal region and involving the left psoas muscle. This measures 4.1 x 4.7 cm and has SUV max of 5.0. Multiple small FDG avid right retroperitoneal and right upper quadrant lymph nodes are identified. I suspect, there is asymmetric involvement of the right renal hilum with associated mild hydronephrosis. Difficulty to measure lymph nodes in this region individually. The SUV max within the right retroperitoneum is equal to 4.30. There is mild increased radiotracer uptake within both adrenal glands are identified which appear mildly enlarged. SUV max within the left adrenal gland is equal to 3.38. SUV max associated with the right adrenal gland is equal to 4.67. Incidental CT findings: Cholecystectomy. Aortic atherosclerosis. Mild right hydronephrosis. Status post left  nephroureteral stent placement with mild residual hydronephrosis. Multiple large right kidney cysts identified. Small volume of free fluid noted within the abdomen and pelvis. SKELETON: Multifocal abnormal areas of increased radiotracer uptake are identified involving the axial and appendicular skeleton compatible with osseous involvement by lymphoma. Asymmetric increased uptake within the proximal left humerus has an SUV max of 3.23. Asymmetric increased uptake localizing to the right side of the sternal manubrium has an SUV max of 3.67. Underlying lucent bone lesion is identified measuring 1.4 cm. FDG avid lesion involving bilateral ribs. Index rib lesion on the left has an SUV max of 4.7. Multiple FDG avid lesions are seen within the pelvis. Index lesion within the left side of sacrum has an SUV max of 4.39. Diffuse heterogeneous uptake throughout the spine noted with multiple FDG avid lesions. Index lesion involving the L4 vertebra has an SUV max of 4.77. Associated pathologic fracture involving the L4 vertebra is again noted. Incidental CT findings: none IMPRESSION: 1. Exam positive for FDG avid tumor within the paraspinal region of the posterior mediastinum. There is also an FDG avid tumor within the left retroperitoneum. Poorly defined FDG avid soft tissue infiltration and nodularity within the right retroperitoneum and right upper quadrant of the abdomen is noted with suspected involvement of the right renal hilum. 2. 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 Many of these FDG avid lesions have corresponding lucent bone lesions on CT. Hypermetabolic tumor involving the L4 vertebra is noted with corresponding pathologic fracture. 3. Nonspecific masslike architectural distortion with mild increased uptake is noted within the lingula. Cannot exclude pulmonary the involvement by lymphoma. 4. Bilateral pleural effusions, left greater than right. Malignant pleural effusions not excluded. 5. Interval  placement of left-sided nephroureteral stent with residual mild left hydronephrosis. 6.  Aortic Atherosclerosis (ICD10-I70.0). Electronically Signed   By: Kerby Moors M.D.   On: 12/07/2019 14:57   DG Chest Port 1 View  Result Date: 12/12/2019 CLINICAL DATA:  Pain status post fall EXAM: PORTABLE CHEST 1 VIEW COMPARISON:  08/01/2019 FINDINGS: There is a new moderate to large left-sided pleural effusion. There is a small right-sided pleural effusion. The heart size is relatively normal. Aortic calcifications are noted. There is no pneumothorax. There is no definite acute displaced fracture however evaluation of the left-sided ribs is limited by the large pleural effusion and osteopenia. The patient's known posterior mediastinal mass is better appreciated on prior CT. IMPRESSION: Moderate to large left-sided pleural effusion, new since prior study. Electronically Signed   By: Jamie Kato.D.  On: 12/12/2019 01:52   DG Foot 2 Views Left  Result Date: 12/12/2019 CLINICAL DATA:  Pain status post fall EXAM: LEFT FOOT - 2 VIEW COMPARISON:  None. FINDINGS: There is no evidence of fracture or dislocation. There is no evidence of arthropathy or other focal bone abnormality. There is mild soft tissue swelling about the dorsal aspect of the forefoot. Is a small plantar calcaneal spur. There are degenerative changes of the midfoot. IMPRESSION: No acute displaced fracture. Electronically Signed   By: Constance Holster M.D.   On: 12/12/2019 01:46   DG C-Arm 1-60 Min-No Report  Result Date: 11/19/2019 Fluoroscopy was utilized by the requesting physician.  No radiographic interpretation.   CT Renal Stone Study  Result Date: 12/12/2019 CLINICAL DATA:  Lymphoma.  Hydronephrosis. EXAM: CT ABDOMEN AND PELVIS WITHOUT CONTRAST TECHNIQUE: Multidetector CT imaging of the abdomen and pelvis was performed following the standard protocol without IV contrast. COMPARISON:  Ultrasound 12/11/2019. PET scan 12/07/2019. CT  11/17/2019. FINDINGS: Lower chest: Enlarging effusions left more than right with atelectasis in both lower lungs. Lymphoma mass in the retrocrural space, increased in volume compared to the study of January 12th. Thickness of tumor in this region up to 3 cm. Previously this measured about 2 cm. Majority encasement of the aorta. Hepatobiliary: No visible liver parenchymal lesion. Previous cholecystectomy. Pancreas: Negative Spleen: Negative Adrenals/Urinary Tract: Left adrenal gland appears normal. Right adrenal gland difficult to separate from retroperitoneal mass. Left kidney shows a double-J ureteral stent in place. Mild fullness of the left renal collecting system. Renal stent an ureter passes along side a left-sided retroperitoneal mass which has enlarged since the previous study, measuring approximately 4 x 7 cm compared with 2 x 4 cm previously. Hydronephrosis of the right kidney again seen, with multiple cysts as well. Tumor infiltrating the region of the hilum has increased in volume. Bladder appears unremarkable, except for the left double-J ureteral stent entering the bladder. Stomach/Bowel: No evidence of bowel obstruction. Mild edema adjacent to the proximal sigmoid colon could possibly represent low level diverticulitis. This is more pronounced than was seen previously. Vascular/Lymphatic: See above regarding extensive retroperitoneal lymphoma tissue. Reproductive: Previous hysterectomy. Other: No free fluid or air. Musculoskeletal: Subacute fractures of the spine as seen previously, not grossly progressive. No lytic lesion or pathologic fracture of the pelvic bones. IMPRESSION: Pleural effusions larger on the left than the right with dependent atelectasis. Increase in tumor volume in the retrocrural region and the left retroperitoneum at the abdominopelvic junction region. See above. Increase in amount of tumor in the right renal hilar region. Double-J ureteral stent in place on the left. Mild fullness  of the left renal collecting system. Hydronephrosis of the right kidney, likely obstructed by the tumor in the renal hilar region. Increased edema in the fat in the proximal sigmoid colon region suggests mild diverticulitis. Multiple lumbar compression fractures at not visibly progressed. Electronically Signed   By: Nelson Chimes M.D.   On: 12/12/2019 05:24   VAS [place] LOWER EXTREMITY VENOUS (DVT)  Result Date: 12/13/2019  Lower Venous DVT Study Indications: Edema.  Limitations: Body habitus and edema. Comparison Study: Prior Right Lower extremity venous duplex from 06/09/2018 is                   available for comparison Performing Technologist: Sharion Dove RVS  Examination Guidelines: A complete evaluation includes B-mode imaging, spectral Doppler, color Doppler, and power Doppler as needed of all accessible portions of each vessel. Bilateral testing is  considered an integral part of a complete examination. Limited examinations for reoccurring indications may be performed as noted. The reflux portion of the exam is performed with the patient in reverse Trendelenburg.  +---------+---------------+---------+-----------+----------+-------------------+ RIGHT    CompressibilityPhasicitySpontaneityPropertiesThrombus Aging      +---------+---------------+---------+-----------+----------+-------------------+ CFV      Full           Yes      Yes                                      +---------+---------------+---------+-----------+----------+-------------------+ SFJ      Full                                                             +---------+---------------+---------+-----------+----------+-------------------+ FV Prox  Full                                                             +---------+---------------+---------+-----------+----------+-------------------+ FV Mid   Full                                                              +---------+---------------+---------+-----------+----------+-------------------+ FV Distal               Yes      Yes                  patent by Color and                                                       Doppler             +---------+---------------+---------+-----------+----------+-------------------+ PFV      Full                                                             +---------+---------------+---------+-----------+----------+-------------------+ POP                     Yes      Yes                  patent by Color and                                                       Doppler             +---------+---------------+---------+-----------+----------+-------------------+  PTV      Full                                                             +---------+---------------+---------+-----------+----------+-------------------+ PERO     Full                                                             +---------+---------------+---------+-----------+----------+-------------------+   +---------+---------------+---------+-----------+----------+--------------+ LEFT     CompressibilityPhasicitySpontaneityPropertiesThrombus Aging +---------+---------------+---------+-----------+----------+--------------+ CFV      Full           Yes      Yes                                 +---------+---------------+---------+-----------+----------+--------------+ SFJ      Full                                                        +---------+---------------+---------+-----------+----------+--------------+ FV Prox  Full                                                        +---------+---------------+---------+-----------+----------+--------------+ FV Mid   Full                                                        +---------+---------------+---------+-----------+----------+--------------+ FV DistalFull                                                         +---------+---------------+---------+-----------+----------+--------------+ PFV      Full                                                        +---------+---------------+---------+-----------+----------+--------------+ POP      Full           Yes      Yes                                 +---------+---------------+---------+-----------+----------+--------------+ PTV      Full                                                        +---------+---------------+---------+-----------+----------+--------------+  PERO     Full                                                        +---------+---------------+---------+-----------+----------+--------------+     Summary: RIGHT: - Findings appear essentially unchanged compared to previous examination. - There is no evidence of deep vein thrombosis in the lower extremity. However, portions of this examination were limited- see technologist comments above.  LEFT: - There is no evidence of deep vein thrombosis in the lower extremity.  *See table(s) above for measurements and observations. Electronically signed by Ruta Hinds MD on 12/13/2019 at 11:40:24 AM.    Final    US THORACENTESIS ASP PLEURAL SPACE W/IMG GUIDE  Result Date: 12/12/2019 INDICATION: Patient with history of retroperitoneal malignant neoplasm was bilateral pleural effusion presents for therapeutic and diagnostic thoracentesis. EXAM: ULTRASOUND GUIDED THERAPEUTIC AND DIAGNOSTIC THORACENTESIS MEDICATIONS: Lidocaine 1% 10 mL COMPLICATIONS: None immediate. PROCEDURE: An ultrasound guided thoracentesis was thoroughly discussed with the patient and questions answered. The benefits, risks, alternatives and complications were also discussed. The patient understands and wishes to proceed with the procedure. Written consent was obtained. Ultrasound was performed to localize and mark an adequate pocket of fluid in the left-sided chest. The area was then prepped and draped in  the normal sterile fashion. 1% Lidocaine was used for local anesthesia. Under ultrasound guidance a catheter was introduced. Thoracentesis was performed. The catheter was removed and a dressing applied. FINDINGS: A total of approximately 650 mL of amber colored fluid was removed. Samples were sent to the laboratory as requested by the clinical team. IMPRESSION: Successful ultrasound guided therapeutic and diagnostic left-sided thoracentesis yielding 650 mL of pleural fluid. Read by Rushie Nyhan NP Electronically Signed   By: Corrie Mckusick D.O.   On: 12/12/2019 10:39    Labs:  CBC: Recent Labs    11/27/19 1017 12/10/19 1422 12/12/19 0048 12/13/19 0633  WBC 5.0 4.4 5.4 4.0  HGB 13.6 11.8* 13.1 11.5*  HCT 44.2 40.1 42.0 36.2  PLT 187 168 170 131*    COAGS: Recent Labs    11/19/19 0342  INR 1.3*  APTT 34.1*    BMP: Recent Labs    11/27/19 1017 12/10/19 1422 12/12/19 0048 12/13/19 0633  NA 142 142 143 142  K 3.9 4.4 4.7 4.2  CL 107 113* 113* 113*  CO2 26 18* 16* 17*  GLUCOSE 162* 101* 93 101*  BUN 14 30* 32* 32*  CALCIUM 8.7* 9.0 9.4 8.5*  CREATININE 1.14* 2.00* 2.91* 3.38*  GFRNONAA 45* 23* 15* 12*  GFRAA 52* 26* 17* 14*    LIVER FUNCTION TESTS: Recent Labs    11/18/19 0630 11/19/19 0342 11/27/19 1017 12/12/19 0048  BILITOT 0.6 0.8 0.5 0.5  AST 13* 10* 11* 18  ALT '7 6 8 10  '$ ALKPHOS 49 53 54 49  PROT 6.4* 6.6 6.9 7.9  ALBUMIN 2.9* 2.9* 2.8* 2.8*    TUMOR MARKERS: No results for input(s): AFPTM, CEA, CA199, CHROMGRNA in the last 8760 hours.  Assessment and Plan:  84 y.o, female inpatient. 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 NM renal study shows left sided hydronephrosis with a sided sided kidney with cystic changes.  Team is requesting nephrostomy tube placement - Left.  Pertinent Imaging 2.7.21 - NM renal reads Left  ureteral stent in place. With mild left hydro. Questionable right hydro with cystic kidney change  Pertinent IR History 2.6.21 - Thoracentesis Patient currently scheduled for bone marrow biopsy and retroperitoneal biopsy as OP on 2.17.21  Pertinent Allergies Benadryl  Cr 3.38, BUN 32 All labs are within acceptable parameters. Patient is on subcutaneous prophylactic dose of heparin currenlty listed as discontinued. Patient is afebrile.  Risks and benefits of left PCN placement was discussed with the patient including, but not limited to, infection, bleeding, significant bleeding causing loss or decrease in renal function or damage to adjacent structures.   All of the patient's  And family's questions were answered, patient is agreeable to proceed.  Consent signed and in chart.     Thank you for this interesting consult.  I greatly enjoyed meeting Regina Harris and look forward to participating in their care.  A copy of this report was sent to the requesting provider on this date.  Electronically Signed: Avel Peace, NP 12/13/2019, 1:39 PM   I spent a total of 40 Minutes    in face to face in clinical consultation, greater than 50% of which was counseling/coordinating care for nephrostomy tube placement - left

## 2019-12-13 NOTE — Consult Note (Signed)
We are following this patient for acute worsening of her renal failure in the setting of retroperitoneal mass and lymphadenopathy and bilateral hydronephrosis.  Interval: The patient underwent a Lasix renogram today. This demonstrated only 18% function of the right kidney. The left kidney had good radioisotope uptake, but had a high-grade obstruction.  The patient otherwise is resting comfortably and had an uneventful night. She has no complaints. She has been n.p.o. since midnight.  No current facility-administered medications on file prior to encounter.   Current Outpatient Medications on File Prior to Encounter  Medication Sig Dispense Refill  . acetaminophen (TYLENOL) 500 MG tablet Take 2 tablets (1,000 mg total) by mouth 3 (three) times daily. 30 tablet 0  . aspirin EC 81 MG tablet Take 81 mg by mouth daily.    Marland Kitchen atorvastatin (LIPITOR) 10 MG tablet TAKE 1 TABLET BY MOUTH ONCE DAILY (Patient taking differently: Take 10 mg by mouth daily. ) 90 tablet 1  . Calcium Carb-Cholecalciferol (CALCIUM 1000 + D) 1000-800 MG-UNIT TABS Take 1 tablet by mouth 2 (two) times daily.     . cyanocobalamin 1000 MCG tablet Take 1,000 mcg by mouth daily.    Marland Kitchen donepezil (ARICEPT) 5 MG tablet TAKE 1 TABLET BY MOUTH AT BEDTIME (Patient taking differently: Take 5 mg by mouth at bedtime. ) 30 tablet 5  . furosemide (LASIX) 20 MG tablet TAKE 1 TABLET BY MOUTH ONCE A DAY (Patient taking differently: Take 20 mg by mouth daily. ) 90 tablet 1  . gabapentin (NEURONTIN) 400 MG capsule TAKE 1 CAPSULE BY MOUTH TWICE DAILY (Patient taking differently: Take 400 mg by mouth 2 (two) times daily. ) 180 capsule 1  . levothyroxine (SYNTHROID) 75 MCG tablet Take 75 mcg by mouth daily with breakfast.     . megestrol (MEGACE) 40 MG tablet Take 1 tablet (40 mg total) by mouth daily. 30 tablet 1  . omeprazole (PRILOSEC) 20 MG capsule TAKE 1 CAPSULE BY MOUTH TWICE DAILY (Patient taking differently: Take 20 mg by mouth 2 (two) times daily. )  180 capsule 1  . potassium chloride SA (K-DUR) 20 MEQ tablet TAKE 1 TABLET BY MOUTH TWICE (2) DAILY (Patient taking differently: Take 20 mEq by mouth 2 (two) times daily. ) 180 tablet 1  . traMADol (ULTRAM) 50 MG tablet Take 1 tablet (50 mg total) by mouth every 6 (six) hours as needed. (Patient taking differently: Take 50 mg by mouth every 6 (six) hours as needed for moderate pain. ) 60 tablet 1  . zinc sulfate 220 (50 Zn) MG capsule Take 1 capsule (220 mg total) by mouth daily.    . [DISCONTINUED] potassium chloride (K-DUR) 10 MEQ tablet Take 1 tablet (10 mEq total) by mouth daily. 30 tablet 3   Patient Active Problem List   Diagnosis Date Noted  . Bilateral hydronephrosis 12/12/2019  . Retroperitoneal mass 12/04/2019  . AKI (acute kidney injury) ([place]) 11/17/2019  . Acute metabolic encephalopathy 00/37/0488  . Acute lower UTI 07/29/2019  . Pneumonia due to COVID-19 virus 07/29/2019  . COVID-19 virus infection 07/29/2019  . UTI (urinary tract infection) 07/27/2019  . Nasal congestion 07/27/2019  . B12 deficiency 01/05/2019  . Memory loss 12/30/2018  . Poor balance 12/30/2018  . Low back pain 10/14/2018  . Fatigue 10/01/2018  . Abnormal urinalysis 10/01/2018  . 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 Right leg swelling 06/09/2018  . Right knee pain 06/09/2018  . Baker's cyst of knee, right 06/09/2018  . Encounter for screening mammogram for  breast cancer 02/27/2017  . Estrogen deficiency 02/27/2017  . Foot pain, bilateral 02/27/2017  . Hypothyroidism 04/16/2016  . Pedal edema 04/05/2016  . Right rotator cuff tear 03/09/2016  . Hypokalemia 03/01/2016  . Nodule of chest wall 10/06/2014  . Left breast mass 06/30/2014  . Encounter for Medicare annual wellness exam 02/22/2014  . Polycythemia, secondary 02/22/2014  . GERD (gastroesophageal reflux disease) 08/24/2013  . Lump or mass in breast 05/06/2013  . Post-menopausal 06/24/2012  . Obesity 12/24/2011  . Prediabetes 03/12/2011   . NEOPLASM UNSPECIFIED NATURE DIGESTIVE SYSTEM 07/24/2010  . LUNG NODULE 01/16/2010  . HYPERCHOLESTEROLEMIA, PURE 07/10/2007  . Former smoker 03/19/2007  . Essential hypertension 03/19/2007  . Carotid bruit 03/19/2007  . COLONIC POLYPS, HX OF 03/19/2007     Vitals:   12/12/19 0945 12/12/19 1656 12/12/19 2044 12/13/19 0448  BP: (!) 143/74 137/70 (!) 143/72 (!) 158/54  Pulse:  88 87 (!) 102  Resp:   16 18  Temp:  98 F (36.7 C) 98 F (36.7 C) 98.1 F (36.7 C)  TempSrc:  Oral Oral Oral  SpO2: 93% 94% 97% 94%  Weight:   96.6 kg   Height:      I/O last 3 completed shifts: In: 1663 [P.O.:480; I.V.:1083; IV Piggyback:100] Out: 850 [Urine:850]  NAD Non-labored breathing Abdomen soft Extremity symmetric  Recent Labs    12/10/19 1422 12/12/19 0048 12/13/19 0633  WBC 4.4 5.4 4.0  HGB 11.8* 13.1 11.5*  HCT 40.1 42.0 36.2   Recent Labs    12/10/19 1422 12/12/19 0048 12/13/19 0633  NA 142 143 142  K 4.4 4.7 4.2  CL 113* 113* 113*  CO2 18* 16* 17*  GLUCOSE 101* 93 101*  BUN 30* 32* 32*  CREATININE 2.00* 2.91* 3.38*  CALCIUM 9.0 9.4 8.5*   No results for input(s): LABPT, INR in the last 72 hours. No results for input(s): PSA in the last 72 hours. No results for input(s): LABURIN in the last 72 hours. Results for orders placed or performed during the hospital encounter of 12/11/19  SARS CORONAVIRUS 2 (TAT 6-24 HRS) Nasopharyngeal Nasopharyngeal Swab     Status: None   Collection Time: 12/12/19  4:24 AM   Specimen: Nasopharyngeal Swab  Result Value Ref Range Status   SARS Coronavirus 2 NEGATIVE NEGATIVE Final    Comment: (NOTE) SARS-CoV-2 target nucleic acids are NOT DETECTED. The SARS-CoV-2 RNA is generally detectable in upper and lower respiratory specimens during the acute phase of infection. Negative results do not preclude SARS-CoV-2 infection, do not rule out co-infections with other pathogens, and should not be used as the sole basis for treatment or other  patient management decisions. Negative results must be combined with clinical observations, patient history, and epidemiological information. The expected result is Negative. Fact Sheet for Patients: SugarRoll.be Fact Sheet for Healthcare Providers: https://www.woods-mathews.com/ This test is not yet approved or cleared by the [place] FDA and  has been authorized for detection and/or diagnosis of SARS-CoV-2 by FDA under an Emergency Use Authorization (EUA). This EUA will remain  in effect (meaning this test can be used) for the duration of the COVID-19 declaration under Section 56 4(b)(1) of the Act, 21 U.S.C. section 360bbb-3(b)(1), unless the authorization is terminated or revoked sooner. Performed at [facility], [place] [address], [place], [place] [address]   Gram stain     Status: None (Preliminary result)   Collection Time: 12/12/19 10:15 AM   Specimen: PATH Cytology Pleural fluid  Result  Value Ref Range Status   Specimen Description PLEURAL LEFT  Final   Special Requests NONE  Final   Gram Stain   Final    MODERATE WBC PRESENT, PREDOMINANTLY MONONUCLEAR NO ORGANISMS SEEN Performed at [facility], [address] [place], [place] [address]    Report Status PENDING  Incomplete  Culture, body fluid-bottle     Status: None (Preliminary result)   Collection Time: 12/12/19 10:15 AM   Specimen: Pleura  Result Value Ref Range Status   Specimen Description PLEURAL LEFT  Final   Special Requests NONE  Final   Culture   Final    NO GROWTH < 24 HOURS Performed at [facility], [place] [address], [place], [place] [address]    Report Status PENDING  Incomplete   IMAGING: Independently reviewed the patient's Lasix renogram showing 82% left-sided differential function with high-grade obstruction bilaterally.  D/W family.  Impression: The patient has high-grade obstruction on the left, with associated ureteral stent  failure. She has a fairly low functioning right kidney. Not sure if this is a chronic problem or if this is associated with the aggregation of protein from her disease process, but the degree of function makes it low yield. Further, placement of a nephrostomy tube into the collecting system would be extremely difficult given the surrounding cysts obscuring the normal anatomy and the high potential for placing the tube in his cyst versus collecting system.  Recommendations: I have recommended that a left-sided nephrostomy tube be placed. I also would suggest leaving the stent in place, this can be removed at a later date, but may be providing some benefit now and in the future. I discussed this with radiology who agrees with placing a left-sided tube. Hopefully this will allow the patient's renal function to improve and she can then quickly be started on a treatment regimen for her malignancy.    I have updated family.

## 2019-12-14 ENCOUNTER — Inpatient Hospital Stay: Payer: Medicare Other

## 2019-12-14 ENCOUNTER — Encounter (HOSPITAL_COMMUNITY): Payer: Self-pay | Admitting: Internal Medicine

## 2019-12-14 DIAGNOSIS — S91312A Laceration without foreign body, left foot, initial encounter: Secondary | ICD-10-CM

## 2019-12-14 DIAGNOSIS — Z515 Encounter for palliative care: Secondary | ICD-10-CM

## 2019-12-14 DIAGNOSIS — N1330 Unspecified hydronephrosis: Secondary | ICD-10-CM

## 2019-12-14 DIAGNOSIS — N179 Acute kidney failure, unspecified: Secondary | ICD-10-CM

## 2019-12-14 DIAGNOSIS — C9000 Multiple myeloma not having achieved remission: Principal | ICD-10-CM

## 2019-12-14 LAB — CBC WITH DIFFERENTIAL/PLATELET
Abs Immature Granulocytes: 0.03 10*3/uL (ref 0.00–0.07)
Basophils Absolute: 0 10*3/uL (ref 0.0–0.1)
Basophils Relative: 1 %
Eosinophils Absolute: 0.1 10*3/uL (ref 0.0–0.5)
Eosinophils Relative: 3 %
HCT: 38.9 % (ref 36.0–46.0)
Hemoglobin: 12.3 g/dL (ref 12.0–15.0)
Immature Granulocytes: 1 %
Lymphocytes Relative: 17 %
Lymphs Abs: 0.8 10*3/uL (ref 0.7–4.0)
MCH: 30.8 pg (ref 26.0–34.0)
MCHC: 31.6 g/dL (ref 30.0–36.0)
MCV: 97.3 fL (ref 80.0–100.0)
Monocytes Absolute: 0.9 10*3/uL (ref 0.1–1.0)
Monocytes Relative: 17 %
Neutro Abs: 3 10*3/uL (ref 1.7–7.7)
Neutrophils Relative %: 61 %
Platelets: 150 10*3/uL (ref 150–400)
RBC: 4 MIL/uL (ref 3.87–5.11)
RDW: 12.4 % (ref 11.5–15.5)
WBC: 4.9 10*3/uL (ref 4.0–10.5)
nRBC: 0 % (ref 0.0–0.2)

## 2019-12-14 LAB — URINE CULTURE: Culture: 40000 — AB

## 2019-12-14 LAB — BASIC METABOLIC PANEL
Anion gap: 13 (ref 5–15)
BUN: 33 mg/dL — ABNORMAL HIGH (ref 8–23)
CO2: 18 mmol/L — ABNORMAL LOW (ref 22–32)
Calcium: 8.4 mg/dL — ABNORMAL LOW (ref 8.9–10.3)
Chloride: 112 mmol/L — ABNORMAL HIGH (ref 98–111)
Creatinine, Ser: 3.47 mg/dL — ABNORMAL HIGH (ref 0.44–1.00)
GFR calc Af Amer: 14 mL/min — ABNORMAL LOW (ref >60)
GFR calc non Af Amer: 12 mL/min — ABNORMAL LOW (ref >60)
Glucose, Bld: 94 mg/dL (ref 70–99)
Potassium: 4 mmol/L (ref 3.5–5.1)
Sodium: 143 mmol/L (ref 135–145)

## 2019-12-14 MED ORDER — AMOXICILLIN 250 MG PO CAPS
250.0000 mg | ORAL_CAPSULE | Freq: Two times a day (BID) | ORAL | Status: DC
  Administered 2019-12-14 – 2019-12-15 (×3): 250 mg via ORAL
  Filled 2019-12-14 (×4): qty 1

## 2019-12-14 NOTE — Plan of Care (Signed)
Plan of care reviewed with pt and family member at bedside. Left Nephrostomy tube in place, output charted in flowsheet. Safety measures and fall prevention in place, call bell within reach. Pt denies needs at this time, will continue to monitor.  Problem: Education: Goal: Knowledge of General Education information will improve Description: Including pain rating scale, medication(s)/side effects and non-pharmacologic comfort measures Outcome: Progressing   Problem: Health Behavior/Discharge Planning: Goal: Ability to manage health-related needs will improve Outcome: Progressing   Problem: Clinical Measurements: Goal: Ability to maintain clinical measurements within normal limits will improve Outcome: Progressing Goal: Will remain free from infection Outcome: Progressing Goal: Diagnostic test results will improve Outcome: Progressing Goal: Respiratory complications will improve Outcome: Progressing Goal: Cardiovascular complication will be avoided Outcome: Progressing   Problem: Activity: Goal: Risk for activity intolerance will decrease Outcome: Progressing   Problem: Nutrition: Goal: Adequate nutrition will be maintained Outcome: Progressing   Problem: Coping: Goal: Level of anxiety will decrease Outcome: Progressing   Problem: Elimination: Goal: Will not experience complications related to bowel motility Outcome: Progressing Goal: Will not experience complications related to urinary retention Outcome: Progressing   Problem: Pain Managment: Goal: General experience of comfort will improve Outcome: Progressing   Problem: Safety: Goal: Ability to remain free from injury will improve Outcome: Progressing   Problem: Skin Integrity: Goal: Risk for impaired skin integrity will decrease Outcome: Progressing

## 2019-12-14 NOTE — Consult Note (Signed)
Consultation Note Date: 12/14/2019   Patient Name: Regina Harris  DOB: [birth year]  MRN: [id number]  Age / Sex: 84 y.o., female  PCP: Tower, Wynelle Fanny, MD Referring Physician: Barb Merino, MD  Reason for Consultation: Establishing goals of care and Psychosocial/spiritual support  HPI/Patient Profile: 84 y.o. female  with past medical history of tobacco use, polycythemia, thyroid disease, DM, Barretts esophagus, remote breast lump and COVID in 07/2019.  who was admitted on 12/11/2019 with bilateral hydronephrosis.  She has recently be diagnosed with periaortic and retroperitoneal masses and lymphadenopathy.  In January 2021 she had an admission for acute kidney injury secondary to obstructive uropathy.  A stent was placed in her left kidney at that time.  This admission Urology was consulted.  An uptake scan revealed complete obstruction in the left kidney and only 18% uptake in the right kidney.  A percutaneous nephrostomy tube was placed in her left kidney.  The patient is scheduled for bone marrow biopsy tomorrow morning 2/9.  Clinical Assessment and Goals of Care:  I have reviewed medical records including EPIC notes, labs and imaging, received report from the care team, examined the patient and met at bedside with her grand daughter Regina Harris  to discuss diagnosis prognosis, [place], EOL wishes, disposition and options.  I introduced Palliative Medicine as specialized medical care for people living with serious illness. It focuses on providing relief from the symptoms and stress of a serious illness.   We discussed a brief life review of the patient. She was born and raised in [place], and has always been a strong independent woman who never complained.  She helped raise her grand daughter Regina Harris as her daughter worked full time.  She had a career with [employer] for 42 years.  Her husband passed away  about 3 years ago.    As far as functional status she has declined over the last 4 months since having COVID.  She has declined more rapidly in the last 2-3 weeks.  PT worked with her today and she did not have the strength to sit edge of bed.  I watched as she attempted to bring a sippy cup to her lips and she was unable to do so without assistance.  She coughed on the sip of water.  She is unfortunately very weak.  We discussed her current illness and what it means in the larger context of her on-going co-morbidities.  Natural disease trajectory and expectations at EOL were discussed.  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  She understands her grand mother's condition and options very well.  Dr. Grayland Ormond made himself available to [place] for a quick phone call.  We discussed the options of pursuing chemo therapy vs going home with hospice.  Regina Harris expressed concern that her grand mother is much too weak to be transported to the office for chemotherapy.  Dr. Grayland Ormond agreed but felt that with even one treatment he would be able to reduce the light chains and it is  possible her symptoms could improve as well.   We ended up developing a plan to transfer Regina Harris to [facility] for inpatient chemotherapy treatment.  If after a few days she is feeling better she can be discharged home with plans to follow up for more treatment.  If she is not feeling better she will be discharged home with support from [facility] (She is a Palliative patient with them now).  The plan was discussed with Dr. Raelyn Mora of the Hospitalist Service who also felt it was the best way to proceed.  Questions and concerns were addressed.  The family was encouraged to call with questions or concerns.   Primary Decision Maker:  HCPOA Grand daughter Scientist, forensic (Oncology RN)    SUMMARY OF RECOMMENDATIONS     Transfer to [facility] for inpatient chemo tomorrow after bone marrow biopsy is completed by [facility].   Dr. Sloan Leiter will transfer her to the Hospitalist Service at [facility]  Dr. Grayland Ormond is aware of her transfer and will make arrangements for her to receive chemotherapy.  Patient will discharge home a few days after chemo.  If she is improved please continue [facility] at home and she will continue chemo.  If she is not improved, please discharge her to home with [facility] services.  Code Status/Advance Care Planning:  DNR   Symptom Management:   Tramadol for lower back pain.  Additional Recommendations (Limitations, Scope, Preferences):  Full Scope Treatment  Palliative Prophylaxis:   Frequent Pain Assessment  Psycho-social/Spiritual:   Desire for further Chaplaincy support: welcomed.  Prognosis: Likely weeks to months given rapid decline and acute renal failure.  Hopefully chemotherapy will improve that prognosis.    Discharge Planning: Transfer to [facility] for inpatient chemotherapy.      Primary Diagnoses: Present on Admission: . Retroperitoneal mass . AKI (acute kidney injury) ([place]) . Bilateral hydronephrosis . UTI (urinary tract infection)   I have reviewed the medical record, interviewed the patient and family, and examined the patient. The following aspects are pertinent.  Past Medical History:  Diagnosis Date  . Barrett esophagus   . Carotid bruit   . Diabetes mellitus without complication ([place])   . Esophagitis   . Gastritis 2013  . GERD (gastroesophageal reflux disease)   . HH (hiatus hernia)   . History of colonic polyps   . History of repair of right rotator cuff   . Hyperlipidemia   . Hypertension   . Hyperthyroidism 06/09/2018   Per [facility] RAI Therapy for Hyperthyroidism order  . Lump or mass in breast   . Pneumonia   . Polycythemia, secondary   . Tobacco abuse    Social History   Socioeconomic History  . Marital status: Widowed    Spouse name: Not on file  . Number of children: Not on file  . Years of education: Not on  file  . Highest education level: Not on file  Occupational History  . Not on file  Tobacco Use  . Smoking status: Former Smoker    Packs/day: 1.00    Types: Cigarettes  . Smokeless tobacco: Never Used  Substance and Sexual Activity  . Alcohol use: No    Alcohol/week: 0.0 standard drinks  . Drug use: No  . Sexual activity: Never  Other Topics Concern  . Not on file  Social History Narrative  . Not on file   Social Determinants of Health   Financial Resource Strain:   . Difficulty of Paying Living Expenses: Not  on file  Food Insecurity:   . Worried About Charity fundraiser in the Last Year: Not on file  . Ran Out of Food in the Last Year: Not on file  Transportation Needs:   . Lack of Transportation (Medical): Not on file  . Lack of Transportation (Non-Medical): Not on file  Physical Activity:   . Days of Exercise per Week: Not on file  . Minutes of Exercise per Session: Not on file  Stress:   . Feeling of Stress : Not on file  Social Connections:   . Frequency of Communication with Friends and Family: Not on file  . Frequency of Social Gatherings with Friends and Family: Not on file  . Attends Religious Services: Not on file  . Active Member of Clubs or Organizations: Not on file  . Attends Archivist Meetings: Not on file  . Marital Status: Not on file   Family History  Problem Relation Age of Onset  . Kidney cancer Father   . Diabetes Mother   . Breast cancer Neg Hx    Scheduled Meds: . amoxicillin  250 mg Oral Q12H  . aspirin EC  81 mg Oral Daily  . atorvastatin  10 mg Oral Daily  . donepezil  5 mg Oral QHS  . levothyroxine  75 mcg Oral Q0600  . pantoprazole  40 mg Oral BID  . sodium bicarbonate  1,300 mg Oral BID   Continuous Infusions: PRN Meds:.acetaminophen **OR** acetaminophen, ondansetron **OR** ondansetron (ZOFRAN) IV, traMADol Allergies  Allergen Reactions  . Benadryl [Diphenhydramine] Other (See Comments)    Hallucinations; family  request to not give this medication.   Review of Systems complains of low back pain.  Denies constipation.  +weakness, +fatigue.  Physical Exam  Very pleasant chronically ill appearing female, awake alert, cooperative. CV distant RRR resp no distress no w/c/r Abdomen obese soft, nt, nd  Vital Signs: BP 129/63 (BP Location: Left Arm)   Pulse 96   Temp 98.9 F (37.2 C) (Oral)   Resp 18   Ht 5' 7"  (1.702 m)   Wt 98 kg   SpO2 92%   BMI 33.83 kg/m  Pain Scale: 0-10 POSS *See Group Information*: 1-Acceptable,Awake and alert Pain Score: 3    SpO2: SpO2: 92 % O2 Device:SpO2: 92 % O2 Flow Rate: .O2 Flow Rate (L/min): 2 L/min  IO: Intake/output summary:   Intake/Output Summary (Last 24 hours) at 12/14/2019 1509 Last data filed at 12/14/2019 1123 Gross per 24 hour  Intake 1675 ml  Output 1400 ml  Net 275 ml    LBM: Last BM Date: 12/12/19 Baseline Weight: Weight: 95.2 kg Most recent weight: Weight: 98 kg     Palliative Assessment/Data: 20%     Time In: 3:00 Time Out: 4:00 Time Total: 60 min. Visit consisted of counseling and education dealing with the complex and emotionally intense issues surrounding the need for palliative care and symptom management in the setting of serious and potentially life-threatening illness. Greater than 50%  of this time was spent counseling and coordinating care related to the above assessment and plan.  Signed by: Florentina Jenny, PA-C Palliative Medicine   Please contact Palliative Medicine Team phone at [phone] for questions and concerns.  For individual provider: See Shea Evans

## 2019-12-14 NOTE — Progress Notes (Signed)
Referring Physician(s): Ghimire,K/Finnegan,T  Supervising Physician: Markus Daft  Patient Status:  [facility] - In-pt  Chief Complaint: Acute renal failure, possible lymphoma vs myeloma   Subjective: Pt doing ok; appears fatigued; granddaughter in room; has some left leg discomfort; denies flank pain,N/V or worsening resp symptoms   Allergies: Benadryl [diphenhydramine]  Medications: Prior to Admission medications   Medication Sig Start Date End Date Taking? Authorizing Provider  acetaminophen (TYLENOL) 500 MG tablet Take 2 tablets (1,000 mg total) by mouth 3 (three) times daily. 11/20/19  Yes Danford, Suann Larry, MD  aspirin EC 81 MG tablet Take 81 mg by mouth daily.   Yes [provider]  atorvastatin (LIPITOR) 10 MG tablet TAKE 1 TABLET BY MOUTH ONCE DAILY Patient taking differently: Take 10 mg by mouth daily.  08/04/19  Yes Tower, Wynelle Fanny, MD  Calcium Carb-Cholecalciferol (CALCIUM 1000 + D) 1000-800 MG-UNIT TABS Take 1 tablet by mouth 2 (two) times daily.    Yes [provider]  cyanocobalamin 1000 MCG tablet Take 1,000 mcg by mouth daily.   Yes [provider]  donepezil (ARICEPT) 5 MG tablet TAKE 1 TABLET BY MOUTH AT BEDTIME Patient taking differently: Take 5 mg by mouth at bedtime.  11/30/19  Yes Tower, Wynelle Fanny, MD  furosemide (LASIX) 20 MG tablet TAKE 1 TABLET BY MOUTH ONCE A DAY Patient taking differently: Take 20 mg by mouth daily.  07/22/19  Yes Tower, Wynelle Fanny, MD  gabapentin (NEURONTIN) 400 MG capsule TAKE 1 CAPSULE BY MOUTH TWICE DAILY Patient taking differently: Take 400 mg by mouth 2 (two) times daily.  08/04/19  Yes Tower, Wynelle Fanny, MD  levothyroxine (SYNTHROID) 75 MCG tablet Take 75 mcg by mouth daily with breakfast.  07/21/19  Yes [provider]  megestrol (MEGACE) 40 MG tablet Take 1 tablet (40 mg total) by mouth daily. 11/27/19  Yes Lloyd Huger, MD  omeprazole (PRILOSEC) 20 MG capsule TAKE 1 CAPSULE BY MOUTH TWICE  DAILY Patient taking differently: Take 20 mg by mouth 2 (two) times daily.  07/22/19  Yes Tower, Wynelle Fanny, MD  potassium chloride SA (K-DUR) 20 MEQ tablet TAKE 1 TABLET BY MOUTH TWICE (2) DAILY Patient taking differently: Take 20 mEq by mouth 2 (two) times daily.  08/04/19  Yes Tower, Wynelle Fanny, MD  traMADol (ULTRAM) 50 MG tablet Take 1 tablet (50 mg total) by mouth every 6 (six) hours as needed. Patient taking differently: Take 50 mg by mouth every 6 (six) hours as needed for moderate pain.  12/10/19  Yes Lloyd Huger, MD  zinc sulfate 220 (50 Zn) MG capsule Take 1 capsule (220 mg total) by mouth daily. 08/07/19  Yes Gherghe, Vella Redhead, MD  potassium chloride (K-DUR) 10 MEQ tablet Take 1 tablet (10 mEq total) by mouth daily. 01/04/14 05/02/14  Tower, Wynelle Fanny, MD     Vital Signs: BP 129/63 (BP Location: Left Arm)   Pulse 96   Temp 98.9 F (37.2 C) (Oral)   Resp 18   Ht 5' 7"  (1.702 m)   Wt 216 lb (98 kg)   SpO2 92%   BMI 33.83 kg/m   Physical Exam awake, answers questions appropriately; chest- sl dim BS bases; heart RRR; bad- soft,+BS,NT; left PCN intact, OP 450 cc blood-tinged urine; site NT; bilat LE edema noted  Imaging: DG Chest 1 View  Result Date: 12/12/2019 CLINICAL DATA:  Status post LEFT thoracentesis. EXAM: CHEST  1 VIEW COMPARISON:  12/12/2019 FINDINGS: LEFT pleural effusion has  decreased, now trace. LEFT basilar atelectasis has improved. A trace RIGHT pleural effusion may be present. There is no evidence of pneumothorax. IMPRESSION: Decreased LEFT pleural effusion, now trace, with improved LEFT basilar atelectasis. No evidence of pneumothorax. Electronically Signed   By: Margarette Canada M.D.   On: 12/12/2019 10:49   DG Knee 2 Views Left  Result Date: 12/12/2019 CLINICAL DATA:  Knee pain EXAM: LEFT KNEE - 1-2 VIEW COMPARISON:  None. FINDINGS: No fracture or dislocation. Tricompartmental osteoarthritis is noted with joint space loss and subchondral sclerosis. No large knee joint  effusion. Patellar enthesopathy seen. Vascular calcifications are noted. IMPRESSION: No acute osseous abnormality. Electronically Signed   By: Prudencio Pair M.D.   On: 12/12/2019 03:35   DG Abd 1 View  Result Date: 12/11/2019 CLINICAL DATA:  Ureteral stent. Lower abdominal pain. EXAM: ABDOMEN - 1 VIEW COMPARISON:  November 17, 2019. FINDINGS: The bowel gas pattern is normal. Status post cholecystectomy. Left ureteral stent is noted in grossly good position. Phleboliths are noted in the pelvis. No nephrolithiasis is noted. IMPRESSION: Left ureteral stent is noted in grossly good position. No evidence of bowel obstruction or ileus. Electronically Signed   By: Marijo Conception M.D.   On: 12/11/2019 16:30   CT Head Wo Contrast  Result Date: 12/12/2019 CLINICAL DATA:  Fall weakness EXAM: CT HEAD WITHOUT CONTRAST TECHNIQUE: Contiguous axial images were obtained from the base of the skull through the vertex without intravenous contrast. COMPARISON:  December 07, 2019 brain MRI FINDINGS: Brain: No evidence of acute territorial infarction, hemorrhage, hydrocephalus,extra-axial collection or mass lesion/mass effect. There is dilatation the ventricles and sulci consistent with age-related atrophy. Low-attenuation changes in the deep white matter consistent with small vessel ischemia. Vascular: No hyperdense vessel or unexpected calcification. Skull: The skull is intact. No fracture or focal lesion identified. Sinuses/Orbits: The visualized paranasal sinuses and mastoid air cells are clear. The orbits and globes intact. Other: Small laceration seen over the posterior left parietal skull. IMPRESSION: No acute intracranial abnormality. Findings consistent with age related atrophy and chronic small vessel ischemia Electronically Signed   By: Prudencio Pair M.D.   On: 12/12/2019 00:45   NM Renal Imaging Flow W/O Pharm  Result Date: 12/14/2019 CLINICAL DATA:  Hydronephrosis, abnormal CT, LEFT renal collecting system dilatation  with prior stenting, RIGHT hydronephrosis with tumor invasion of RIGHT renal hilum, history lymphoma EXAM: NUCLEAR MEDICINE RENAL SCAN TECHNIQUE: Radionuclide angiographic and sequential renal images were obtained after intravenous injection of radiopharmaceutical. Lasix not administered due to new acute renal failure. RADIOPHARMACEUTICALS:  5.38 mCi Technetium-68mMAG3 IV COMPARISON:  CT abdomen and pelvis 12/12/2019 FINDINGS: Blood flow: Poor tracer bolus. Probable delayed blood flow to both kidneys though this is suboptimally assessed. LEFT kidney: Delayed uptake, concentration and excretion of tracer by LEFT kidney. Central photopenia early in exam corresponds to mildly dilated collecting system. Delayed excretion of into the dilated collecting system. Significant retained tracer within the dilated collecting system and the renal cortex at the conclusion of the exam. Analysis of the renogram curve demonstrates a continually increasing curve, with delayed time to peak activity of 42.7 minutes. RIGHT kidney: Delayed and concentration of tracer by RIGHT kidney. The upper and mid portions of the RIGHT kidney demonstrate no renal function, corresponding to marked hydronephrosis and tumor invasion on CT. No significant excretion of tracer into a dilated collecting system is seen over the course of the exam. No ureteral activity identified. Significant retained tracer at the inferior pole the RIGHT  kidney at the conclusion of the study. Analysis of the renogram curve demonstrates a continually climbing curved peaking at the conclusion of the exam at 45.7 minutes. Differential renal function: LEFT: 82% RIGHT: 18% IMPRESSION: Absent function at the upper and mid RIGHT kidney corresponding to hydronephrosis and tumor invasion on CT. Markedly impaired BILATERAL renal function with significantly delayed uptake, concentration and excretion of tracer, evidence by continually increasing renogram curves bilaterally. Asymmetric  renal function, markedly greater on LEFT than RIGHT. Electronically Signed   By: Lavonia Dana M.D.   On: 12/14/2019 08:41   US RENAL  Result Date: 12/11/2019 CLINICAL DATA:  Retroperitoneal mass on CT, increasing creatinine EXAM: RENAL / URINARY TRACT ULTRASOUND COMPLETE COMPARISON:  CT abdomen and pelvis 11/18/2019 FINDINGS: Right Kidney: Renal measurements: 11.9 x 7.9 x 7.9 cm = volume: 788 mL. Cortical thinning. Normal cortical echogenicity. Mild-to-moderate RIGHT hydronephrosis. Cystic lesion at upper pole 7.2 x 5.9 x 5.7 cm, simple features. Additional peripelvic/mid RIGHT renal cyst 5.7 x 4.3 x 4.0 cm. No solid masses or shadowing calculi. Left Kidney: Renal measurements: 12.1 x 6.3 x 6.9 cm = volume: 273 mL. Cortical thinning. Mild hydronephrosis. Minimally increased cortical echogenicity. Small mass at upper pole 2.8 x 3.2 x 2.3 cm, by prior CT exam appears represent a splenic lobulation rather than a renal lesion. Small exophytic cyst at inferior pole 2.1 x 1.9 x 1.6 cm. No additional renal mass. Bladder: Stent in urinary bladder.  Bladder poorly distended. Other: Small LEFT pleural effusion. IMPRESSION: BILATERAL hydronephrosis, slightly greater on RIGHT. BILATERAL renal cysts larger on RIGHT. Small LEFT pleural effusion. Electronically Signed   By: Lavonia Dana M.D.   On: 12/11/2019 14:48   DG Chest Port 1 View  Result Date: 12/12/2019 CLINICAL DATA:  Pain status post fall EXAM: PORTABLE CHEST 1 VIEW COMPARISON:  08/01/2019 FINDINGS: There is a new moderate to large left-sided pleural effusion. There is a small right-sided pleural effusion. The heart size is relatively normal. Aortic calcifications are noted. There is no pneumothorax. There is no definite acute displaced fracture however evaluation of the left-sided ribs is limited by the large pleural effusion and osteopenia. The patient's known posterior mediastinal mass is better appreciated on prior CT. IMPRESSION: Moderate to large left-sided  pleural effusion, new since prior study. Electronically Signed   By: Constance Holster M.D.   On: 12/12/2019 01:52   DG Foot 2 Views Left  Result Date: 12/12/2019 CLINICAL DATA:  Pain status post fall EXAM: LEFT FOOT - 2 VIEW COMPARISON:  None. FINDINGS: There is no evidence of fracture or dislocation. There is no evidence of arthropathy or other focal bone abnormality. There is mild soft tissue swelling about the dorsal aspect of the forefoot. Is a small plantar calcaneal spur. There are degenerative changes of the midfoot. IMPRESSION: No acute displaced fracture. Electronically Signed   By: Constance Holster M.D.   On: 12/12/2019 01:46   CT Renal Stone Study  Result Date: 12/12/2019 CLINICAL DATA:  Lymphoma.  Hydronephrosis. EXAM: CT ABDOMEN AND PELVIS WITHOUT CONTRAST TECHNIQUE: Multidetector CT imaging of the abdomen and pelvis was performed following the standard protocol without IV contrast. COMPARISON:  Ultrasound 12/11/2019. PET scan 12/07/2019. CT 11/17/2019. FINDINGS: Lower chest: Enlarging effusions left more than right with atelectasis in both lower lungs. Lymphoma mass in the retrocrural space, increased in volume compared to the study of January 12th. Thickness of tumor in this region up to 3 cm. Previously this measured about 2 cm. Majority encasement of the  aorta. Hepatobiliary: No visible liver parenchymal lesion. Previous cholecystectomy. Pancreas: Negative Spleen: Negative Adrenals/Urinary Tract: Left adrenal gland appears normal. Right adrenal gland difficult to separate from retroperitoneal mass. Left kidney shows a double-J ureteral stent in place. Mild fullness of the left renal collecting system. Renal stent an ureter passes along side a left-sided retroperitoneal mass which has enlarged since the previous study, measuring approximately 4 x 7 cm compared with 2 x 4 cm previously. Hydronephrosis of the right kidney again seen, with multiple cysts as well. Tumor infiltrating the region  of the hilum has increased in volume. Bladder appears unremarkable, except for the left double-J ureteral stent entering the bladder. Stomach/Bowel: No evidence of bowel obstruction. Mild edema adjacent to the proximal sigmoid colon could possibly represent low level diverticulitis. This is more pronounced than was seen previously. Vascular/Lymphatic: See above regarding extensive retroperitoneal lymphoma tissue. Reproductive: Previous hysterectomy. Other: No free fluid or air. Musculoskeletal: Subacute fractures of the spine as seen previously, not grossly progressive. No lytic lesion or pathologic fracture of the pelvic bones. IMPRESSION: Pleural effusions larger on the left than the right with dependent atelectasis. Increase in tumor volume in the retrocrural region and the left retroperitoneum at the abdominopelvic junction region. See above. Increase in amount of tumor in the right renal hilar region. Double-J ureteral stent in place on the left. Mild fullness of the left renal collecting system. Hydronephrosis of the right kidney, likely obstructed by the tumor in the renal hilar region. Increased edema in the fat in the proximal sigmoid colon region suggests mild diverticulitis. Multiple lumbar compression fractures at not visibly progressed. Electronically Signed   By: Nelson Chimes M.D.   On: 12/12/2019 05:24   IR NEPHROSTOMY PLACEMENT LEFT  Result Date: 12/13/2019 INDICATION: 84 year old female with a history of left-sided hydronephrosis, referred for nephrostomy placement EXAM: IR NEPHROSTOMY PLACEMENT LEFT COMPARISON:  None. MEDICATIONS: None ANESTHESIA/SEDATION: Fentanyl 100 mcg IV; Versed 2.0 mg IV Moderate Sedation Time:  15 minutes The patient was continuously monitored during the procedure by the interventional radiology nurse under my direct supervision. CONTRAST:  72m OMNIPAQUE IOHEXOL 300 MG/ML SOLN - administered into the collecting system(s) FLUOROSCOPY TIME:  Fluoroscopy Time: 3 minutes 12  seconds (22 mGy). COMPLICATIONS: None PROCEDURE: Informed written consent was obtained from the patient after a thorough discussion of the procedural risks, benefits and alternatives. All questions were addressed. Maximal Sterile Barrier Technique was utilized including caps, mask, sterile gowns, sterile gloves, sterile drape, hand hygiene and skin antiseptic. A timeout was performed prior to the initiation of the procedure. Patient positioned prone position on the fluoroscopy table. Ultrasound survey of the left flank was performed with images stored and sent to PACs. The patient was then prepped and draped in the usual sterile fashion. 1% lidocaine was used to anesthetize the skin and subcutaneous tissues for local anesthesia. A Chiba needle was then used to access a posterior inferior calyx with ultrasound guidance. With spontaneous urine returned through the needle, passage of an 018 micro wire into the collecting system was performed under fluoroscopy. A small incision was made with an 11 blade scalpel, and the needle was removed from the wire. An Accustick system was then advanced over the wire into the collecting system under fluoroscopy. The metal stiffener and inner dilator were removed, and then a sample of fluid was aspirated through the 4 French outer sheath. Bentson wire was passed into the collecting system and the sheath removed. Ten French dilation of the soft tissues was performed.  The dilator encountered a site of resistance and a kink in the wire developed. We then removed the dilator and pass the Accustick 4 French sheath over the wire into the collecting system. The wire was then removed entirely, and a new 035 Amplatz wire was placed. Using modified Seldinger technique, a 10 French pigtail catheter drain was placed over the Amplatz wire. Wire and inner stiffener removed, and the pigtail was formed in the collecting system. Small amount of contrast confirmed position of the catheter. Patient  tolerated the procedure well and remained hemodynamically stable throughout. No complications were encountered and no significant blood loss encountered IMPRESSION: Status post placement of left percutaneous nephrostomy. Signed, Dulcy Fanny. Dellia Nims, RPVI [facility] Radiology Electronically Signed   By: Corrie Mckusick D.O.   On: 12/13/2019 15:24   VAS [place] LOWER EXTREMITY VENOUS (DVT)  Result Date: 12/13/2019  Lower Venous DVT Study Indications: Edema.  Limitations: Body habitus and edema. Comparison Study: Prior Right Lower extremity venous duplex from 06/09/2018 is                   available for comparison Performing Technologist: Sharion Dove RVS  Examination Guidelines: A complete evaluation includes B-mode imaging, spectral Doppler, color Doppler, and power Doppler as needed of all accessible portions of each vessel. Bilateral testing is considered an integral part of a complete examination. Limited examinations for reoccurring indications may be performed as noted. The reflux portion of the exam is performed with the patient in reverse Trendelenburg.  +---------+---------------+---------+-----------+----------+-------------------+ RIGHT    CompressibilityPhasicitySpontaneityPropertiesThrombus Aging      +---------+---------------+---------+-----------+----------+-------------------+ CFV      Full           Yes      Yes                                      +---------+---------------+---------+-----------+----------+-------------------+ SFJ      Full                                                             +---------+---------------+---------+-----------+----------+-------------------+ FV Prox  Full                                                             +---------+---------------+---------+-----------+----------+-------------------+ FV Mid   Full                                                              +---------+---------------+---------+-----------+----------+-------------------+ FV Distal               Yes      Yes                  patent by Color and  Doppler             +---------+---------------+---------+-----------+----------+-------------------+ PFV      Full                                                             +---------+---------------+---------+-----------+----------+-------------------+ POP                     Yes      Yes                  patent by Color and                                                       Doppler             +---------+---------------+---------+-----------+----------+-------------------+ PTV      Full                                                             +---------+---------------+---------+-----------+----------+-------------------+ PERO     Full                                                             +---------+---------------+---------+-----------+----------+-------------------+   +---------+---------------+---------+-----------+----------+--------------+ LEFT     CompressibilityPhasicitySpontaneityPropertiesThrombus Aging +---------+---------------+---------+-----------+----------+--------------+ CFV      Full           Yes      Yes                                 +---------+---------------+---------+-----------+----------+--------------+ SFJ      Full                                                        +---------+---------------+---------+-----------+----------+--------------+ FV Prox  Full                                                        +---------+---------------+---------+-----------+----------+--------------+ FV Mid   Full                                                        +---------+---------------+---------+-----------+----------+--------------+ FV DistalFull                                                         +---------+---------------+---------+-----------+----------+--------------+  PFV      Full                                                        +---------+---------------+---------+-----------+----------+--------------+ POP      Full           Yes      Yes                                 +---------+---------------+---------+-----------+----------+--------------+ PTV      Full                                                        +---------+---------------+---------+-----------+----------+--------------+ PERO     Full                                                        +---------+---------------+---------+-----------+----------+--------------+     Summary: RIGHT: - Findings appear essentially unchanged compared to previous examination. - There is no evidence of deep vein thrombosis in the lower extremity. However, portions of this examination were limited- see technologist comments above.  LEFT: - There is no evidence of deep vein thrombosis in the lower extremity.  *See table(s) above for measurements and observations. Electronically signed by Ruta Hinds MD on 12/13/2019 at 11:40:24 AM.    Final    US THORACENTESIS ASP PLEURAL SPACE W/IMG GUIDE  Result Date: 12/12/2019 INDICATION: Patient with history of retroperitoneal malignant neoplasm was bilateral pleural effusion presents for therapeutic and diagnostic thoracentesis. EXAM: ULTRASOUND GUIDED THERAPEUTIC AND DIAGNOSTIC THORACENTESIS MEDICATIONS: Lidocaine 1% 10 mL COMPLICATIONS: None immediate. PROCEDURE: An ultrasound guided thoracentesis was thoroughly discussed with the patient and questions answered. The benefits, risks, alternatives and complications were also discussed. The patient understands and wishes to proceed with the procedure. Written consent was obtained. Ultrasound was performed to localize and mark an adequate pocket of fluid in the left-sided chest. The area was then prepped and draped in  the normal sterile fashion. 1% Lidocaine was used for local anesthesia. Under ultrasound guidance a catheter was introduced. Thoracentesis was performed. The catheter was removed and a dressing applied. FINDINGS: A total of approximately 650 mL of amber colored fluid was removed. Samples were sent to the laboratory as requested by the clinical team. IMPRESSION: Successful ultrasound guided therapeutic and diagnostic left-sided thoracentesis yielding 650 mL of pleural fluid. Read by Rushie Nyhan NP Electronically Signed   By: Corrie Mckusick D.O.   On: 12/12/2019 10:39    Labs:  CBC: Recent Labs    12/10/19 1422 12/12/19 0048 12/13/19 0633 12/14/19 0541  WBC 4.4 5.4 4.0 4.9  HGB 11.8* 13.1 11.5* 12.3  HCT 40.1 42.0 36.2 38.9  PLT 168 170 131* 150    COAGS: Recent Labs    11/19/19 0342  INR 1.3*  APTT 34.1*    BMP: Recent Labs    12/10/19 1422 12/12/19 0048 12/13/19 0633 12/14/19 0541  NA 142 143  142 143  K 4.4 4.7 4.2 4.0  CL 113* 113* 113* 112*  CO2 18* 16* 17* 18*  GLUCOSE 101* 93 101* 94  BUN 30* 32* 32* 33*  CALCIUM 9.0 9.4 8.5* 8.4*  CREATININE 2.00* 2.91* 3.38* 3.47*  GFRNONAA 23* 15* 12* 12*  GFRAA 26* 17* 14* 14*    LIVER FUNCTION TESTS: Recent Labs    11/18/19 0630 11/19/19 0342 11/27/19 1017 12/12/19 0048  BILITOT 0.6 0.8 0.5 0.5  AST 13* 10* 11* 18  ALT 7 6 8 10   ALKPHOS 49 53 54 49  PROT 6.4* 6.6 6.9 7.9  ALBUMIN 2.9* 2.9* 2.8* 2.8*    Assessment and Plan: Pt with hx ARF, left hydronephrosis/left ureteral stent, 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  All of the questions were answered and there is agreement to proceed.  Consent signed and in chart.  Procedure scheduled for 2/9 am   Electronically Signed: D. Rowe Robert, PA-C 12/14/2019, 10:37 AM   I spent a total of 25 minutes at the the patient's bedside AND on the patient's hospital floor or unit, greater than 50% of which was counseling/coordinating care for left nephrostomy/CT guided bone marrow biopsy    Patient ID: Regina Harris, female   DOB: [birth year], 84 y.o.   MRN: [id number]

## 2019-12-14 NOTE — Progress Notes (Signed)
Went to bedside.  Patient is just about to start working with PT.  [place] daughter wants to observe.  I will attempt to come back later this afternoon.  Florentina Jenny, PA-C Palliative Medicine Office:  [phone]

## 2019-12-14 NOTE — Evaluation (Signed)
Physical Therapy Evaluation Patient Details Name: Regina Harris MRN: [id number] DOB: 09/13/1936 Today's Date: 12/14/2019   History of Present Illness  84 y.o. 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 Renal study shows left sided hydronephrosis with a sided kidney with cystic changes. Nephrostomy tube placement - Left.  Clinical Impression  Pt was seen for mobility of supine to sit and standing with RW using two person help.  Her efforts are a struggle due to weakness R shoulder, debility of recent bedrest and her decline preadmission.  Will work on standing and getting OOB as she can tolerate, with request to go to CIR as pt was home walking with no assist until recently.  Follow acutely for these needs, strengthening and balance to translate to better quality of gait.    Follow Up Recommendations CIR    Equipment Recommendations  Rolling walker with 5" wheels    Recommendations for Other Services Rehab consult     Precautions / Restrictions Precautions Precautions: Fall Precaution Comments: L nephrostomy tube Restrictions Weight Bearing Restrictions: No      Mobility  Bed Mobility Overal bed mobility: Needs Assistance Bed Mobility: Rolling;Supine to Sit;Sit to Supine Rolling: Mod assist;+2 for physical assistance;+2 for safety/equipment   Supine to sit: Total assist;+2 for physical assistance;+2 for safety/equipment Sit to supine: Total assist;+2 for physical assistance;+2 for safety/equipment   General bed mobility comments: total A as pt pushes back and to R side but is being made to offload her L side  Transfers Overall transfer level: Needs assistance Equipment used: Rolling walker (2 wheeled);2 person hand held assist Transfers: Sit to/from Stand Sit to Stand: Mod assist;+2 physical assistance;+2 safety/equipment;From elevated surface         General transfer comment: short trial with pt not fully standing up  Ambulation/Gait             General Gait Details: unable  Stairs            Wheelchair Mobility    Modified Rankin (Stroke Patients Only)       Balance Overall balance assessment: Needs assistance Sitting-balance support: Feet supported;Bilateral upper extremity supported Sitting balance-Leahy Scale: Poor Sitting balance - Comments: leaning to R side and back   Standing balance support: Bilateral upper extremity supported;During functional activity Standing balance-Leahy Scale: Poor                               Pertinent Vitals/Pain Pain Assessment: Faces Faces Pain Scale: Hurts little more Pain Location: L side Pain Descriptors / Indicators: Sore Pain Intervention(s): Limited activity within patient's tolerance;Monitored during session;Repositioned    Home Living Family/patient expects to be discharged to:: Private residence Living Arrangements: Alone Available Help at Discharge: Other (Comment)(sitters 24/7, family on weekends) Type of Home: House Home Access: Stairs to enter   [employer] of Steps: 1 step with B railings plus 1 step(can hold onto door-frame) Home Layout: One level Home Equipment: Walker - 4 wheels;Toilet riser;Tub bench Additional Comments: grand daughter present and trying to arrange for her Grandmother to go home    Prior Function Level of Independence: Needs assistance   Gait / Transfers Assistance Needed: Ambulatory with rollator(needed help to walk last 2-3 weeks)  ADL's / Homemaking Assistance Needed: Has sitter 24/7 to assist with meals, assist with bathing.  pt able to toilet herself  Comments: as per chart pt was living alone      Hand Dominance   Dominant Hand: Left    Extremity/Trunk Assessment   Upper Extremity Assessment Upper Extremity Assessment: Defer to OT evaluation    Lower Extremity Assessment Lower  Extremity Assessment: Generalized weakness    Cervical / Trunk Assessment Cervical / Trunk Assessment: Kyphotic  Communication   Communication: HOH  Cognition Arousal/Alertness: Awake/alert Behavior During Therapy: Flat affect Overall Cognitive Status: History of cognitive impairments - at baseline                                 General Comments: Granddaughter present clarifying information as needed.      General Comments General comments (skin integrity, edema, etc.): pt is actually more stable to stand with two than to sit with two assist    Exercises     Assessment/Plan    PT Assessment Patient needs continued PT services  PT Problem List Decreased strength;Decreased activity tolerance;Decreased balance;Decreased mobility       PT Treatment Interventions DME instruction;Gait training;Stair training;Functional mobility training;Therapeutic activities;Therapeutic exercise;Balance training;Patient/family education    PT Goals (Current goals can be found in the Care Plan section)  Acute Rehab PT Goals Patient Stated Goal: go home PT Goal Formulation: With patient/family Time For Goal Achievement: 12/04/19 Potential to Achieve Goals: Good    Frequency Min 2X/week   Barriers to discharge Inaccessible home environment;Decreased caregiver support home with one to assist at a time    Co-evaluation PT/OT/SLP Co-Evaluation/Treatment: Yes Reason for Co-Treatment: Complexity of the patient's impairments (multi-system involvement);For patient/therapist safety;To address functional/ADL transfers PT goals addressed during session: Mobility/safety with mobility;Balance;Proper use of DME OT goals addressed during session: ADL's and self-care;Proper use of Adaptive equipment and DME       AM-PAC PT "6 Clicks" Mobility  Outcome Measure Help needed turning from your back to your side while in a flat bed without using bedrails?: A Little Help needed moving from  lying on your back to sitting on the side of a flat bed without using bedrails?: A Lot Help needed moving to and from a bed to a chair (including a wheelchair)?: A Lot Help needed standing up from a chair using your arms (e.g., wheelchair or bedside chair)?: A Lot Help needed to walk in hospital room?: A Lot   6 Click Score: 11    End of Session Equipment Utilized During Treatment: Gait belt Activity Tolerance: Patient tolerated treatment well Patient left: in bed;with call bell/phone within reach;with bed alarm set;with family/visitor present Nurse Communication: Mobility status PT Visit Diagnosis: Other abnormalities of gait and mobility (R26.89);Muscle weakness (generalized) (M62.81)    Time: 8786-7672 PT Time Calculation (min) (ACUTE ONLY): 54 min   Charges:   PT Evaluation $PT Eval Moderate Complexity: 1 Mod PT Treatments $Therapeutic Activity: 8-22 mins $Neuromuscular Re-education: 8-22 mins       Ramond Dial 12/14/2019, 4:23 PM   Mee Hives, PT MS Acute Rehab Dept. Number: [place] [facility] [place]

## 2019-12-14 NOTE — Consult Note (Signed)
   [facility] CM Inpatient Consult   12/14/2019  Regina Harris 01-10-1936 [id number]    [facility]: ACTIVE status:   Patient is currently active with [facility]) Care Management services. Patient is engaged with [facility] RN CM coordinator forEMMI follow-up calls and transition of care.  Manufacturing engineer is active with patient for palliative care services in the community.   Patientchecked for23%highrisk score for unplanned readmission, 3 hospitalizations and an ED visit in the past  6 months under her Medicare/ Next[employer]plan.  Review of medical record shows MDbrief narrative as follows: 84 year old, diagnosed with retroperitoneal malignant neoplasm about a month ago, hydronephrosis status post cystoscopy with left ureteral stent placement 1/14 followed by urology and oncology at [facility], recently treated for E. coli UTI. Final pathology consistent with lymphoma versus myeloma. Not on treatment yet. [Bilateral hydronephrosis, acute kidney injury., retroperitoneal mass- for bone marrow biopsy]  Chart review shows that patient is from home with 24 hours care. Progressively declining performance status and worsening creatinine.  Patient will receive a post hospital call and will be evaluated for assessments and disease process education if transitioning to home.  Primary Beattie with [facility], listed as providing transition of care follow-up.  Await  PT evaluation and recommendation. OT evaluation completed recommending CIR (Comprehensive Inpatient Rehab), although family would like to take patient home if they can manage her care.  Palliative Care is following patient this admission.  Plan: Will follow progress and recommendations for disposition with inpatient transition of care team and make aware that [facility] CM following. Will inform [facility])RN care Nurse, children's of disposition/  needs.  Of note, [facility] Care Management services does not replace or interfere with any services that are arranged by inpatient case management or social work.   For questions oradditional information,please contact:  Bayley Hurn A. Ghalia Reicks, BSN, RN-BC [facility] Liaison Cell: [phone]

## 2019-12-14 NOTE — Evaluation (Signed)
Occupational Therapy Evaluation Patient Details Name: Regina Harris MRN: [id number] DOB: 04-25-1936 Today's Date: 12/14/2019    History of Present Illness Regina Harris is a 84 y.o. 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 Renal study shows left sided hydronephrosis with a sided kidney with cystic changes. Nephrostomy tube placement - Left.   Clinical Impression   Pt with decline in function and safety with ADLs and ADL mobility with impaired strength, balance and endurance. Pt's grand daughter present and reports that pt ambulates with RW household distances with rollater, has 24-hour assist from caregivers and family with bathing, LB dressing and meals; pt able to toilet herself and dress UB and perform grooming. Pt  has RW, BSC, toilet riser and shower seat. Pt currently requires total A + 2 for bed mobility, max  Total A with ADLs, mod A +2 for sit - stand to RW and was unable to SPT. Family would like to think about post acute rehab vs home with total/extensive assist. Pt would benefit from acute OT services to address impairments to maximize level of function and safety    Follow Up Recommendations  CIR(family would like to take her home if they can manage her care)    Equipment Recommendations  Other (comment)(TBD)    Recommendations for Other Services       Precautions / Restrictions Precautions Precautions: Fall Precaution Comments: nephrostomoy tube Restrictions Weight Bearing Restrictions: No      Mobility Bed Mobility Overal bed mobility: Needs Assistance Bed Mobility: Supine to Sit;Sit to Supine     Supine to sit: Total assist;+2 for physical assistance Sit to supine: Total assist;+2 for physical assistance   General bed mobility comments: total A with LEs, elevating trunk, used pads to scoot hips around and forward. Pt with Poor  sitting balance at EOB, required max - min guard A using rail to steady herself  Transfers Overall transfer level: Needs assistance Equipment used: Rolling walker (2 wheeled) Transfers: Sit to/from Stand Sit to Stand: Mod assist;+2 physical assistance         General transfer comment: stood with RW approximately 30 seconds    Balance Overall balance assessment: Needs assistance Sitting-balance support: Bilateral upper extremity supported;Feet supported Sitting balance-Leahy Scale: Poor Sitting balance - Comments: leaning to R side and back   Standing balance support: Bilateral upper extremity supported Standing balance-Leahy Scale: Poor                             ADL either performed or assessed with clinical judgement   ADL Overall ADL's : Needs assistance/impaired Eating/Feeding: Set up;Supervision/ safety;Sitting;Bed level   Grooming: Wash/dry face;Wash/dry hands;Minimal assistance;Sitting   Upper Body Bathing: Maximal assistance   Lower Body Bathing: Maximal assistance   Upper Body Dressing : Maximal assistance   Lower Body Dressing: Total assistance     Toilet Transfer Details (indicate cue type and reason): stood from EOB mod A +2, unable to transfer due to Poor  balance, Leans to R and backwards Toileting- Clothing Manipulation and Hygiene: Total assistance;Bed level       Functional mobility during ADLs: Moderate assistance;+2 for physical assistance(sit - stand from EOB)       Vision Baseline Vision/History: Wears glasses Patient Visual Report: No change from baseline       Perception  Praxis      Pertinent Vitals/Pain Pain Assessment: Faces Faces Pain Scale: Hurts little more Pain Location: L side Pain Descriptors / Indicators: Discomfort Pain Intervention(s): Limited activity within patient's tolerance;Monitored during session;Repositioned     Hand Dominance Left   Extremity/Trunk Assessment Upper Extremity  Assessment Upper Extremity Assessment: Generalized weakness   Lower Extremity Assessment Lower Extremity Assessment: Defer to PT evaluation   Cervical / Trunk Assessment Cervical / Trunk Assessment: Kyphotic   Communication Communication Communication: HOH   Cognition Arousal/Alertness: Awake/alert Behavior During Therapy: WFL for tasks assessed/performed Overall Cognitive Status: History of cognitive impairments - at baseline                                 General Comments: Granddaughter present clarifying information as needed.   General Comments       Exercises     Shoulder Instructions      Home Living Family/patient expects to be discharged to:: Private residence Living Arrangements: Alone Available Help at Discharge: Other (Comment)(sitters 24/7, family on weekends) Type of Home: House Home Access: Stairs to enter [employer] of Steps: 1 step with B railings plus 1 step(can hold onto door-frame)   Home Layout: One level     Bathroom Shower/Tub: Teacher, early years/pre: Standard     Home Equipment: Environmental consultant - 4 wheels;Toilet riser;Tub bench   Additional Comments: grand daughter present and trying to arrange for her Grandmother to go home      Prior Functioning/Environment Level of Independence: Needs assistance  Gait / Transfers Assistance Needed: Ambulatory with rollator ADL's / Homemaking Assistance Needed: Has sitter 24/7 to assist with meals, assist with bathing. pt able to toilet herself   Comments: as per chart pt was living alone         OT Problem List: Decreased strength;Impaired balance (sitting and/or standing);Decreased cognition;Pain;Decreased activity tolerance;Decreased coordination;Decreased knowledge of use of DME or AE      OT Treatment/Interventions: Self-care/ADL training;DME and/or AE instruction;Therapeutic activities;Balance training;Therapeutic exercise;Patient/family education    OT  Goals(Current goals can be found in the care plan section) Acute Rehab OT Goals Patient Stated Goal: go home OT Goal Formulation: With patient/family Time For Goal Achievement: 12/28/19 Potential to Achieve Goals: Fair ADL Goals Pt Will Perform Grooming: with min guard assist;with supervision;sitting Pt Will Perform Upper Body Bathing: with mod assist;sitting;with caregiver independent in assisting Pt Will Perform Upper Body Dressing: with mod assist;sitting;with caregiver independent in assisting Pt Will Transfer to Toilet: with mod assist;with min assist;with +2 assist;stand pivot transfer;bedside commode Additional ADL Goal #1: Pt will sit EOB  x 15 minutes with Fair balance and min A for balance/support for ADLs and functional tasks  OT Frequency: Min 2X/week   Barriers to D/C:            Co-evaluation PT/OT/SLP Co-Evaluation/Treatment: Yes Reason for Co-Treatment: For patient/therapist safety;To address functional/ADL transfers   OT goals addressed during session: ADL's and self-care;Proper use of Adaptive equipment and DME      AM-PAC OT "6 Clicks" Daily Activity     Outcome Measure Help from another person eating meals?: A Little Help from another person taking care of personal grooming?: A Little Help from another person toileting, which includes using toliet, bedpan, or urinal?: Total Help from another person bathing (including washing, rinsing, drying)?: A Lot Help from another person to put on and taking off regular upper body clothing?: A Lot  Help from another person to put on and taking off regular lower body clothing?: Total 6 Click Score: 12   End of Session Equipment Utilized During Treatment: Gait belt  Activity Tolerance: Patient limited by fatigue Patient left: in bed;with call bell/phone within reach;with family/visitor present  OT Visit Diagnosis: Unsteadiness on feet (R26.81);Other abnormalities of gait and mobility (R26.89);Muscle weakness (generalized)  (M62.81);History of falling (Z91.81);Pain;Other symptoms and signs involving cognitive function Pain - Right/Left: Left Pain - part of body: (back, side (has nephrostomy tube))                Time: 4540-9811 OT Time Calculation (min): 31 min Charges:  OT Evaluation $OT Eval Moderate Complexity: 1 Mod   Britt Bottom 12/14/2019, 2:49 PM

## 2019-12-14 NOTE — Consult Note (Signed)
[facility] Progress Note    Assessment/ Plan:   1. Acute kidney Injury, nonoliguric: Acute likely d/t obstructive uropathy caused by retroperitoneal adenoma. Lasix renogram yesterday demonstrated only 18% function of the right kidney; left kidney with good radioisotope uptake, but had a high-grade obstruction. UPC 4.18 also indicating nephrotic syndrome which indicates possible myeloma kidney disease contributing as well. Left nephrostomy tube placed yesterday by IR without complication. Left ureteral stent retained per urology. 1.5L UOP overnight. Kidney function appears to have plateaud (Cr (3.38>3.47). Will monitor kidney function overnight, if no improvement can consider kidney biopsy on the right.   Electrolytes remain stable. BUN 33.  - scheduled for CT biopsy of tumor and bone marrow on 2/17 - continue to monitor kidney function - consider right renal biopsy if no improvement in kidney function - strict I/O's  2. Metabolic acidosis: improved. - continue Bicarb  3. Hydronephrosis: s/p left percutaneous nephrostomy tube on 2/7. Urology and IR following  4. Retroperitoneal Malignancy  Kappa light chain paraproteinemia: Urology and oncology following. Plan for bone marrow and tumor bx on 2/17. Palliative consulted.  5. R>L pleural effusion: s/p u/s guided diagnostic and therapeutic left pleural thoracenteses on 2/6. Gram stain and culture negative. Cytology pending. Breathing comfortably on room air  Subjective:   Patient resting comfortably this AM. Denies any concerns or complaints.   Objective:   BP 129/63 (BP Location: Left Arm)   Pulse 96   Temp 98.9 F (37.2 C) (Oral)   Resp 18   Ht 5\' 7"  (1.702 m)   Wt 98 kg   SpO2 92%   BMI 33.83 kg/m   Intake/Output Summary (Last 24 hours) at 12/14/2019 2831 Last data filed at 12/14/2019 0830 Gross per 24 hour  Intake 2162.56 ml  Output 1400 ml  Net 762.56 ml   Weight change: 1.361 kg  Physical Exam: General:  resting comfortably in bed,  in no acute distress with non-toxic appearance CV: regular rate and rhythm without murmurs, rubs, or gallops, 3+ LE edema bilaterally Lungs: clear to auscultation bilaterally with normal work of breathing on room air Abdomen: soft, non-tende Skin: warm, dry, bandage at left flank clean and intact Extremities: warm and well perfused  Imaging: DG Chest 1 View  Result Date: 12/12/2019 CLINICAL DATA:  Status post LEFT thoracentesis. EXAM: CHEST  1 VIEW COMPARISON:  12/12/2019 FINDINGS: LEFT pleural effusion has decreased, now trace. LEFT basilar atelectasis has improved. A trace RIGHT pleural effusion may be present. There is no evidence of pneumothorax. IMPRESSION: Decreased LEFT pleural effusion, now trace, with improved LEFT basilar atelectasis. No evidence of pneumothorax. Electronically Signed   By: Margarette Canada M.D.   On: 12/12/2019 10:49   NM Renal Imaging Flow W/O Pharm  Result Date: 12/14/2019 CLINICAL DATA:  Hydronephrosis, abnormal CT, LEFT renal collecting system dilatation with prior stenting, RIGHT hydronephrosis with tumor invasion of RIGHT renal hilum, history lymphoma EXAM: NUCLEAR MEDICINE RENAL SCAN TECHNIQUE: Radionuclide angiographic and sequential renal images were obtained after intravenous injection of radiopharmaceutical. Lasix not administered due to new acute renal failure. RADIOPHARMACEUTICALS:  5.38 mCi Technetium-47m MAG3 IV COMPARISON:  CT abdomen and pelvis 12/12/2019 FINDINGS: Blood flow: Poor tracer bolus. Probable delayed blood flow to both kidneys though this is suboptimally assessed. LEFT kidney: Delayed uptake, concentration and excretion of tracer by LEFT kidney. Central photopenia early in exam corresponds to mildly dilated collecting system. Delayed excretion of into the dilated collecting system. Significant retained tracer within the dilated collecting system and the  renal cortex at the conclusion of the exam. Analysis of the renogram  curve demonstrates a continually increasing curve, with delayed time to peak activity of 42.7 minutes. RIGHT kidney: Delayed and concentration of tracer by RIGHT kidney. The upper and mid portions of the RIGHT kidney demonstrate no renal function, corresponding to marked hydronephrosis and tumor invasion on CT. No significant excretion of tracer into a dilated collecting system is seen over the course of the exam. No ureteral activity identified. Significant retained tracer at the inferior pole the RIGHT kidney at the conclusion of the study. Analysis of the renogram curve demonstrates a continually climbing curved peaking at the conclusion of the exam at 45.7 minutes. Differential renal function: LEFT: 82% RIGHT: 18% IMPRESSION: Absent function at the upper and mid RIGHT kidney corresponding to hydronephrosis and tumor invasion on CT. Markedly impaired BILATERAL renal function with significantly delayed uptake, concentration and excretion of tracer, evidence by continually increasing renogram curves bilaterally. Asymmetric renal function, markedly greater on LEFT than RIGHT. Electronically Signed   By: Lavonia Dana M.D.   On: 12/14/2019 08:41   IR NEPHROSTOMY PLACEMENT LEFT  Result Date: 12/13/2019 INDICATION: 84 year old female with a history of left-sided hydronephrosis, referred for nephrostomy placement EXAM: IR NEPHROSTOMY PLACEMENT LEFT COMPARISON:  None. MEDICATIONS: None ANESTHESIA/SEDATION: Fentanyl 100 mcg IV; Versed 2.0 mg IV Moderate Sedation Time:  15 minutes The patient was continuously monitored during the procedure by the interventional radiology nurse under my direct supervision. CONTRAST:  18mL OMNIPAQUE IOHEXOL 300 MG/ML SOLN - administered into the collecting system(s) FLUOROSCOPY TIME:  Fluoroscopy Time: 3 minutes 12 seconds (22 mGy). COMPLICATIONS: None PROCEDURE: Informed written consent was obtained from the patient after a thorough discussion of the procedural risks, benefits and  alternatives. All questions were addressed. Maximal Sterile Barrier Technique was utilized including caps, mask, sterile gowns, sterile gloves, sterile drape, hand hygiene and skin antiseptic. A timeout was performed prior to the initiation of the procedure. Patient positioned prone position on the fluoroscopy table. Ultrasound survey of the left flank was performed with images stored and sent to PACs. The patient was then prepped and draped in the usual sterile fashion. 1% lidocaine was used to anesthetize the skin and subcutaneous tissues for local anesthesia. A Chiba needle was then used to access a posterior inferior calyx with ultrasound guidance. With spontaneous urine returned through the needle, passage of an 018 micro wire into the collecting system was performed under fluoroscopy. A small incision was made with an 11 blade scalpel, and the needle was removed from the wire. An Accustick system was then advanced over the wire into the collecting system under fluoroscopy. The metal stiffener and inner dilator were removed, and then a sample of fluid was aspirated through the 4 French outer sheath. Bentson wire was passed into the collecting system and the sheath removed. Ten French dilation of the soft tissues was performed. The dilator encountered a site of resistance and a kink in the wire developed. We then removed the dilator and pass the Accustick 4 French sheath over the wire into the collecting system. The wire was then removed entirely, and a new 035 Amplatz wire was placed. Using modified Seldinger technique, a 10 French pigtail catheter drain was placed over the Amplatz wire. Wire and inner stiffener removed, and the pigtail was formed in the collecting system. Small amount of contrast confirmed position of the catheter. Patient tolerated the procedure well and remained hemodynamically stable throughout. No complications were encountered and no significant blood  loss encountered IMPRESSION: Status  post placement of left percutaneous nephrostomy. Signed, Dulcy Fanny. Dellia Nims, RPVI [facility] Electronically Signed   By: Corrie Mckusick D.O.   On: 12/13/2019 15:24   VAS [place] LOWER EXTREMITY VENOUS (DVT)  Result Date: 12/13/2019  Lower Venous DVT Study Indications: Edema.  Limitations: Body habitus and edema. Comparison Study: Prior Right Lower extremity venous duplex from 06/09/2018 is                   available for comparison Performing Technologist: Sharion Dove RVS  Examination Guidelines: A complete evaluation includes B-mode imaging, spectral Doppler, color Doppler, and power Doppler as needed of all accessible portions of each vessel. Bilateral testing is considered an integral part of a complete examination. Limited examinations for reoccurring indications may be performed as noted. The reflux portion of the exam is performed with the patient in reverse Trendelenburg.  +---------+---------------+---------+-----------+----------+-------------------+ RIGHT    CompressibilityPhasicitySpontaneityPropertiesThrombus Aging      +---------+---------------+---------+-----------+----------+-------------------+ CFV      Full           Yes      Yes                                      +---------+---------------+---------+-----------+----------+-------------------+ SFJ      Full                                                             +---------+---------------+---------+-----------+----------+-------------------+ FV Prox  Full                                                             +---------+---------------+---------+-----------+----------+-------------------+ FV Mid   Full                                                             +---------+---------------+---------+-----------+----------+-------------------+ FV Distal               Yes      Yes                  patent by Color and                                                        Doppler             +---------+---------------+---------+-----------+----------+-------------------+ PFV      Full                                                             +---------+---------------+---------+-----------+----------+-------------------+  POP                     Yes      Yes                  patent by Color and                                                       Doppler             +---------+---------------+---------+-----------+----------+-------------------+ PTV      Full                                                             +---------+---------------+---------+-----------+----------+-------------------+ PERO     Full                                                             +---------+---------------+---------+-----------+----------+-------------------+   +---------+---------------+---------+-----------+----------+--------------+ LEFT     CompressibilityPhasicitySpontaneityPropertiesThrombus Aging +---------+---------------+---------+-----------+----------+--------------+ CFV      Full           Yes      Yes                                 +---------+---------------+---------+-----------+----------+--------------+ SFJ      Full                                                        +---------+---------------+---------+-----------+----------+--------------+ FV Prox  Full                                                        +---------+---------------+---------+-----------+----------+--------------+ FV Mid   Full                                                        +---------+---------------+---------+-----------+----------+--------------+ FV DistalFull                                                        +---------+---------------+---------+-----------+----------+--------------+ PFV      Full                                                         +---------+---------------+---------+-----------+----------+--------------+  POP      Full           Yes      Yes                                 +---------+---------------+---------+-----------+----------+--------------+ PTV      Full                                                        +---------+---------------+---------+-----------+----------+--------------+ PERO     Full                                                        +---------+---------------+---------+-----------+----------+--------------+     Summary: RIGHT: - Findings appear essentially unchanged compared to previous examination. - There is no evidence of deep vein thrombosis in the lower extremity. However, portions of this examination were limited- see technologist comments above.  LEFT: - There is no evidence of deep vein thrombosis in the lower extremity.  *See table(s) above for measurements and observations. Electronically signed by Ruta Hinds MD on 12/13/2019 at 11:40:24 AM.    Final    US THORACENTESIS ASP PLEURAL SPACE W/IMG GUIDE  Result Date: 12/12/2019 INDICATION: Patient with history of retroperitoneal malignant neoplasm was bilateral pleural effusion presents for therapeutic and diagnostic thoracentesis. EXAM: ULTRASOUND GUIDED THERAPEUTIC AND DIAGNOSTIC THORACENTESIS MEDICATIONS: Lidocaine 1% 10 mL COMPLICATIONS: None immediate. PROCEDURE: An ultrasound guided thoracentesis was thoroughly discussed with the patient and questions answered. The benefits, risks, alternatives and complications were also discussed. The patient understands and wishes to proceed with the procedure. Written consent was obtained. Ultrasound was performed to localize and mark an adequate pocket of fluid in the left-sided chest. The area was then prepped and draped in the normal sterile fashion. 1% Lidocaine was used for local anesthesia. Under ultrasound guidance a catheter was introduced. Thoracentesis was performed. The catheter was  removed and a dressing applied. FINDINGS: A total of approximately 650 mL of amber colored fluid was removed. Samples were sent to the laboratory as requested by the clinical team. IMPRESSION: Successful ultrasound guided therapeutic and diagnostic left-sided thoracentesis yielding 650 mL of pleural fluid. Read by Rushie Nyhan NP Electronically Signed   By: Corrie Mckusick D.O.   On: 12/12/2019 10:39    Labs: BMET Recent Labs  Lab 12/10/19 1422 12/12/19 0048 12/13/19 0633 12/14/19 0541  NA 142 143 142 143  K 4.4 4.7 4.2 4.0  CL 113* 113* 113* 112*  CO2 18* 16* 17* 18*  GLUCOSE 101* 93 101* 94  BUN 30* 32* 32* 33*  CREATININE 2.00* 2.91* 3.38* 3.47*  CALCIUM 9.0 9.4 8.5* 8.4*   CBC Recent Labs  Lab 12/10/19 1422 12/12/19 0048 12/13/19 0633 12/14/19 0541  WBC 4.4 5.4 4.0 4.9  NEUTROABS 2.8 3.9 2.4 3.0  HGB 11.8* 13.1 11.5* 12.3  HCT 40.1 42.0 36.2 38.9  MCV 103.9* 98.8 97.6 97.3  PLT 168 170 131* 150    Medications:    . aspirin EC  81 mg Oral Daily  . atorvastatin  10 mg Oral Daily  . donepezil  5  mg Oral QHS  . levothyroxine  75 mcg Oral Q0600  . pantoprazole  40 mg Oral BID  . sodium bicarbonate  1,300 mg Oral BID    Mina Marble, DO [facility], PGY2 12/14/2019, 9:58 AM

## 2019-12-14 NOTE — Progress Notes (Signed)
PROGRESS NOTE    Regina Harris  XEN:[id number] DOB: 1936/09/22 DOA: 12/11/2019 PCP: Abner Greenspan, MD    Brief Narrative:  85 year old, diagnosed with retroperitoneal malignant neoplasm about a month ago, hydronephrosis status post cystoscopy with left ureteral stent placement 1/14 followed by urology and oncology at [facility], recently treated for E. coli UTI.  Final pathology consistent with lymphoma versus myeloma.  Not on treatment yet.  Progressively declining performance status and worsening creatinine of 2 seen in the office and sent to the ER. Patient herself is poor historian, just complains of being more weaker.  Assessment & Plan:   Principal Problem:   Bilateral hydronephrosis Active Problems:   UTI (urinary tract infection)   AKI (acute kidney injury) ([place])   Retroperitoneal mass  #1. acute renal failure: Appropriate combination of myeloma kidney and aggravated by obstructive uropathy.   Status post left percutaneous nephrostomy, functioning well. Renal functions gradually worsening.  Lasix on hold. Urine output is adequate. Will need treatment for myeloma.  #2. pleural effusion left: 850 mL removed from left lungs.  Gram stain without evidence of bacteria.  Cytology pending.  Probably malignant pleural effusion given presence of atypical cells. Urine culture with 40,000 enterococci, will treat with amoxicillin for 7 days.  #3. 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  Discussed with her oncologist Dr. Grayland Ormond at [facility] oncology.   Requested radiology for bone marrow biopsy tomorrow. Cytology sent from pleural fluid. Lower extremity duplexes negative for DVT.  Call placed and discussed case with Dr. Delight Hoh patient's oncologist at [facility].  We arrange for bone marrow biopsy for her tomorrow.  We will recheck her renal function tomorrow morning and if she remains fairly stable, she will be discharged so that Dr. Grayland Ormond can start on chemotherapy as soon  as possible.  DVT prophylaxis: SCDs Code Status: DNR Family Communication: Patient's granddaughter Ms. [name]. Disposition Plan: patient is from home with 24 hours care. Anticipated DC to home, Barriers to discharge worsening renal functions, need for inpatient treatment and procedures.   Consultants:   Urology  Nephrology  Interventional radiology  Procedures:   Left thoracentesis  Antimicrobials:   Rocephin   Subjective: Patient seen and examined.  No overnight events. "Only thing that will be good for me would be going home" Patient notes he had some procedure on her kidneys, she still does not know that she has cancer.  She thinks her granddaughter is keeping track of everything and she does not need to worry about it.  Denies any nausea vomiting or abdominal pain.  Objective: Vitals:   12/13/19 1654 12/13/19 2105 12/14/19 0447 12/14/19 0900  BP: (!) 143/80 (!) 151/87 (!) 157/72 129/63  Pulse: 89 97 96 96  Resp: 18 14 20 18   Temp: 98.7 F (37.1 C) 98 F (36.7 C) 97.8 F (36.6 C) 98.9 F (37.2 C)  TempSrc: Oral Oral Oral Oral  SpO2: 96% 94% 94% 92%  Weight:  98 kg    Height:        Intake/Output Summary (Last 24 hours) at 12/14/2019 1239 Last data filed at 12/14/2019 1123 Gross per 24 hour  Intake 2282.56 ml  Output 1400 ml  Net 882.56 ml   Filed Weights   12/12/19 0459 12/12/19 2044 12/13/19 2105  Weight: 95.2 kg 96.6 kg 98 kg    Examination:  General exam: Appears calm and comfortable, comfortable on room air.  Not in any distress.  Pleasantly confused. Respiratory system: Clear to auscultation. Respiratory effort normal.  No  added sounds. Cardiovascular system: S1 & S2 heard, RRR. No JVD, murmurs, rubs, gallops or clicks.  2+ pedal edema, shiny and tenderness of the calf present. Gastrointestinal system: Abdomen is nondistended, soft and nontender. No organomegaly or masses felt. Normal bowel sounds heard. Left nephrostomy tube with dark urine  adequately drained. Central nervous system: Alert and oriented x2. No focal neurological deficits. Extremities: Symmetric 5 x 5 power. Skin: No rashes, lesions or ulcers Psychiatry: Judgement and insight appear normal. Mood & affect flat.    Data Reviewed: I have personally reviewed following labs and imaging studies  CBC: Recent Labs  Lab 12/10/19 1422 12/12/19 0048 12/13/19 0633 12/14/19 0541  WBC 4.4 5.4 4.0 4.9  NEUTROABS 2.8 3.9 2.4 3.0  HGB 11.8* 13.1 11.5* 12.3  HCT 40.1 42.0 36.2 38.9  MCV 103.9* 98.8 97.6 97.3  PLT 168 170 131* 488   Basic Metabolic Panel: Recent Labs  Lab 12/10/19 1422 12/12/19 0048 12/13/19 0633 12/14/19 0541  NA 142 143 142 143  K 4.4 4.7 4.2 4.0  CL 113* 113* 113* 112*  CO2 18* 16* 17* 18*  GLUCOSE 101* 93 101* 94  BUN 30* 32* 32* 33*  CREATININE 2.00* 2.91* 3.38* 3.47*  CALCIUM 9.0 9.4 8.5* 8.4*   GFR: Estimated Creatinine Clearance: 15.3 mL/min (A) (by C-G formula based on SCr of 3.47 mg/dL (H)). Liver Function Tests: Recent Labs  Lab 12/12/19 0048  AST 18  ALT 10  ALKPHOS 49  BILITOT 0.5  PROT 7.9  ALBUMIN 2.8*   No results for input(s): LIPASE, AMYLASE in the last 168 hours. No results for input(s): AMMONIA in the last 168 hours. Coagulation Profile: No results for input(s): INR, PROTIME in the last 168 hours. Cardiac Enzymes: No results for input(s): CKTOTAL, CKMB, CKMBINDEX, TROPONINI in the last 168 hours. BNP (last 3 results) No results for input(s): PROBNP in the last 8760 hours. HbA1C: No results for input(s): HGBA1C in the last 72 hours. CBG: No results for input(s): GLUCAP in the last 168 hours. Lipid Profile: No results for input(s): CHOL, HDL, LDLCALC, TRIG, CHOLHDL, LDLDIRECT in the last 72 hours. Thyroid Function Tests: No results for input(s): TSH, T4TOTAL, FREET4, T3FREE, THYROIDAB in the last 72 hours. Anemia Panel: No results for input(s): VITAMINB12, FOLATE, FERRITIN, TIBC, IRON, RETICCTPCT in the  last 72 hours. Sepsis Labs: No results for input(s): PROCALCITON, LATICACIDVEN in the last 168 hours.  Recent Results (from the past 240 hour(s))  Urine culture     Status: Abnormal   Collection Time: 12/12/19  3:04 AM   Specimen: Urine, Clean Catch  Result Value Ref Range Status   Specimen Description URINE, CLEAN CATCH  Final   Special Requests   Final    NONE Performed at [facility], [address] [place], [place] [address]    Culture 40,000 COLONIES/mL ENTEROCOCCUS FAECALIS (A)  Final   Report Status 12/14/2019 FINAL  Final   Organism ID, Bacteria ENTEROCOCCUS FAECALIS (A)  Final      Susceptibility   Enterococcus faecalis - MIC*    AMPICILLIN 8 SENSITIVE Sensitive     NITROFURANTOIN <=16 SENSITIVE Sensitive     VANCOMYCIN 2 SENSITIVE Sensitive     * 40,000 COLONIES/mL ENTEROCOCCUS FAECALIS  SARS CORONAVIRUS 2 (TAT 6-24 HRS) Nasopharyngeal Nasopharyngeal Swab     Status: None   Collection Time: 12/12/19  4:24 AM   Specimen: Nasopharyngeal Swab  Result Value Ref Range Status   SARS Coronavirus 2 NEGATIVE NEGATIVE Final  Comment: (NOTE) SARS-CoV-2 target nucleic acids are NOT DETECTED. The SARS-CoV-2 RNA is generally detectable in upper and lower respiratory specimens during the acute phase of infection. Negative results do not preclude SARS-CoV-2 infection, do not rule out co-infections with other pathogens, and should not be used as the sole basis for treatment or other patient management decisions. Negative results must be combined with clinical observations, patient history, and epidemiological information. The expected result is Negative. Fact Sheet for Patients: SugarRoll.be Fact Sheet for Healthcare Providers: https://www.woods-mathews.com/ This test is not yet approved or cleared by the [place] FDA and  has been authorized for detection and/or diagnosis of SARS-CoV-2 by FDA under an Emergency Use  Authorization (EUA). This EUA will remain  in effect (meaning this test can be used) for the duration of the COVID-19 declaration under Section 56 4(b)(1) of the Act, 21 U.S.C. section 360bbb-3(b)(1), unless the authorization is terminated or revoked sooner. Performed at [facility], [place] [address] [place], [place] [address]   Gram stain     Status: None (Preliminary result)   Collection Time: 12/12/19 10:15 AM   Specimen: PATH Cytology Pleural fluid  Result Value Ref Range Status   Specimen Description PLEURAL LEFT  Final   Special Requests NONE  Final   Gram Stain   Final    MODERATE WBC PRESENT, PREDOMINANTLY MONONUCLEAR NO ORGANISMS SEEN Performed at [facility], [address] [place], [place] [address]    Report Status PENDING  Incomplete  Culture, body fluid-bottle     Status: None (Preliminary result)   Collection Time: 12/12/19 10:15 AM   Specimen: Pleura  Result Value Ref Range Status   Specimen Description PLEURAL LEFT  Final   Special Requests NONE  Final   Culture   Final    NO GROWTH < 24 HOURS Performed at [facility], [place] [address], [place], [place] [address]    Report Status PENDING  Incomplete         Radiology Studies: NM Renal Imaging Flow W/O Pharm  Result Date: 12/14/2019 CLINICAL DATA:  Hydronephrosis, abnormal CT, LEFT renal collecting system dilatation with prior stenting, RIGHT hydronephrosis with tumor invasion of RIGHT renal hilum, history lymphoma EXAM: NUCLEAR MEDICINE RENAL SCAN TECHNIQUE: Radionuclide angiographic and sequential renal images were obtained after intravenous injection of radiopharmaceutical. Lasix not administered due to new acute renal failure. RADIOPHARMACEUTICALS:  5.38 mCi Technetium-21mMAG3 IV COMPARISON:  CT abdomen and pelvis 12/12/2019 FINDINGS: Blood flow: Poor tracer bolus. Probable delayed blood flow to both kidneys though this is suboptimally assessed. LEFT kidney: Delayed uptake, concentration  and excretion of tracer by LEFT kidney. Central photopenia early in exam corresponds to mildly dilated collecting system. Delayed excretion of into the dilated collecting system. Significant retained tracer within the dilated collecting system and the renal cortex at the conclusion of the exam. Analysis of the renogram curve demonstrates a continually increasing curve, with delayed time to peak activity of 42.7 minutes. RIGHT kidney: Delayed and concentration of tracer by RIGHT kidney. The upper and mid portions of the RIGHT kidney demonstrate no renal function, corresponding to marked hydronephrosis and tumor invasion on CT. No significant excretion of tracer into a dilated collecting system is seen over the course of the exam. No ureteral activity identified. Significant retained tracer at the inferior pole the RIGHT kidney at the conclusion of the study. Analysis of the renogram curve demonstrates a continually climbing curved peaking at the conclusion of the exam at 45.7  minutes. Differential renal function: LEFT: 82% RIGHT: 18% IMPRESSION: Absent function at the upper and mid RIGHT kidney corresponding to hydronephrosis and tumor invasion on CT. Markedly impaired BILATERAL renal function with significantly delayed uptake, concentration and excretion of tracer, evidence by continually increasing renogram curves bilaterally. Asymmetric renal function, markedly greater on LEFT than RIGHT. Electronically Signed   By: Lavonia Dana M.D.   On: 12/14/2019 08:41   IR NEPHROSTOMY PLACEMENT LEFT  Result Date: 12/13/2019 INDICATION: 84 year old female with a history of left-sided hydronephrosis, referred for nephrostomy placement EXAM: IR NEPHROSTOMY PLACEMENT LEFT COMPARISON:  None. MEDICATIONS: None ANESTHESIA/SEDATION: Fentanyl 100 mcg IV; Versed 2.0 mg IV Moderate Sedation Time:  15 minutes The patient was continuously monitored during the procedure by the interventional radiology nurse under my direct supervision.  CONTRAST:  61m OMNIPAQUE IOHEXOL 300 MG/ML SOLN - administered into the collecting system(s) FLUOROSCOPY TIME:  Fluoroscopy Time: 3 minutes 12 seconds (22 mGy). COMPLICATIONS: None PROCEDURE: Informed written consent was obtained from the patient after a thorough discussion of the procedural risks, benefits and alternatives. All questions were addressed. Maximal Sterile Barrier Technique was utilized including caps, mask, sterile gowns, sterile gloves, sterile drape, hand hygiene and skin antiseptic. A timeout was performed prior to the initiation of the procedure. Patient positioned prone position on the fluoroscopy table. Ultrasound survey of the left flank was performed with images stored and sent to PACs. The patient was then prepped and draped in the usual sterile fashion. 1% lidocaine was used to anesthetize the skin and subcutaneous tissues for local anesthesia. A Chiba needle was then used to access a posterior inferior calyx with ultrasound guidance. With spontaneous urine returned through the needle, passage of an 018 micro wire into the collecting system was performed under fluoroscopy. A small incision was made with an 11 blade scalpel, and the needle was removed from the wire. An Accustick system was then advanced over the wire into the collecting system under fluoroscopy. The metal stiffener and inner dilator were removed, and then a sample of fluid was aspirated through the 4 French outer sheath. Bentson wire was passed into the collecting system and the sheath removed. Ten French dilation of the soft tissues was performed. The dilator encountered a site of resistance and a kink in the wire developed. We then removed the dilator and pass the Accustick 4 French sheath over the wire into the collecting system. The wire was then removed entirely, and a new 035 Amplatz wire was placed. Using modified Seldinger technique, a 10 French pigtail catheter drain was placed over the Amplatz wire. Wire and inner  stiffener removed, and the pigtail was formed in the collecting system. Small amount of contrast confirmed position of the catheter. Patient tolerated the procedure well and remained hemodynamically stable throughout. No complications were encountered and no significant blood loss encountered IMPRESSION: Status post placement of left percutaneous nephrostomy. Signed, JDulcy Fanny WDellia Nims RPVI [facility] Electronically Signed   By: JCorrie MckusickD.O.   On: 12/13/2019 15:24   VAS [place]OWER EXTREMITY VENOUS (DVT)  Result Date: 12/13/2019  Lower Venous DVT Study Indications: Edema.  Limitations: Body habitus and edema. Comparison Study: Prior Right Lower extremity venous duplex from 06/09/2018 is                   available for comparison Performing Technologist: CSharion DoveRVS  Examination Guidelines: A complete evaluation includes B-mode imaging, spectral Doppler, color Doppler, and power Doppler as  needed of all accessible portions of each vessel. Bilateral testing is considered an integral part of a complete examination. Limited examinations for reoccurring indications may be performed as noted. The reflux portion of the exam is performed with the patient in reverse Trendelenburg.  +---------+---------------+---------+-----------+----------+-------------------+ RIGHT    CompressibilityPhasicitySpontaneityPropertiesThrombus Aging      +---------+---------------+---------+-----------+----------+-------------------+ CFV      Full           Yes      Yes                                      +---------+---------------+---------+-----------+----------+-------------------+ SFJ      Full                                                             +---------+---------------+---------+-----------+----------+-------------------+ FV Prox  Full                                                              +---------+---------------+---------+-----------+----------+-------------------+ FV Mid   Full                                                             +---------+---------------+---------+-----------+----------+-------------------+ FV Distal               Yes      Yes                  patent by Color and                                                       Doppler             +---------+---------------+---------+-----------+----------+-------------------+ PFV      Full                                                             +---------+---------------+---------+-----------+----------+-------------------+ POP                     Yes      Yes                  patent by Color and  Doppler             +---------+---------------+---------+-----------+----------+-------------------+ PTV      Full                                                             +---------+---------------+---------+-----------+----------+-------------------+ PERO     Full                                                             +---------+---------------+---------+-----------+----------+-------------------+   +---------+---------------+---------+-----------+----------+--------------+ LEFT     CompressibilityPhasicitySpontaneityPropertiesThrombus Aging +---------+---------------+---------+-----------+----------+--------------+ CFV      Full           Yes      Yes                                 +---------+---------------+---------+-----------+----------+--------------+ SFJ      Full                                                        +---------+---------------+---------+-----------+----------+--------------+ FV Prox  Full                                                        +---------+---------------+---------+-----------+----------+--------------+ FV Mid   Full                                                         +---------+---------------+---------+-----------+----------+--------------+ FV DistalFull                                                        +---------+---------------+---------+-----------+----------+--------------+ PFV      Full                                                        +---------+---------------+---------+-----------+----------+--------------+ POP      Full           Yes      Yes                                 +---------+---------------+---------+-----------+----------+--------------+ PTV      Full                                                        +---------+---------------+---------+-----------+----------+--------------+  PERO     Full                                                        +---------+---------------+---------+-----------+----------+--------------+     Summary: RIGHT: - Findings appear essentially unchanged compared to previous examination. - There is no evidence of deep vein thrombosis in the lower extremity. However, portions of this examination were limited- see technologist comments above.  LEFT: - There is no evidence of deep vein thrombosis in the lower extremity.  *See table(s) above for measurements and observations. Electronically signed by Ruta Hinds MD on 12/13/2019 at 11:40:24 AM.    Final         Scheduled Meds: . amoxicillin  250 mg Oral Q12H  . aspirin EC  81 mg Oral Daily  . atorvastatin  10 mg Oral Daily  . donepezil  5 mg Oral QHS  . levothyroxine  75 mcg Oral Q0600  . pantoprazole  40 mg Oral BID  . sodium bicarbonate  1,300 mg Oral BID   Continuous Infusions:    LOS: 2 days    Time spent: 25 minutes    Barb Merino, MD [facility] Pager [phone]

## 2019-12-15 ENCOUNTER — Other Ambulatory Visit: Payer: Self-pay | Admitting: Oncology

## 2019-12-15 ENCOUNTER — Encounter: Payer: Self-pay | Admitting: Oncology

## 2019-12-15 ENCOUNTER — Other Ambulatory Visit: Payer: Self-pay

## 2019-12-15 ENCOUNTER — Inpatient Hospital Stay
Admission: AD | Admit: 2019-12-15 | Discharge: 2019-12-18 | DRG: 847 | Disposition: A | Discharge status: Home / Self Care | Payer: Medicare Other | Source: Other Acute Inpatient Hospital | Attending: Internal Medicine | Admitting: Internal Medicine

## 2019-12-15 ENCOUNTER — Inpatient Hospital Stay (HOSPITAL_COMMUNITY): Payer: Medicare Other

## 2019-12-15 DIAGNOSIS — C9000 Multiple myeloma not having achieved remission: Secondary | ICD-10-CM | POA: Diagnosis present

## 2019-12-15 DIAGNOSIS — J90 Pleural effusion, not elsewhere classified: Secondary | ICD-10-CM | POA: Diagnosis not present

## 2019-12-15 DIAGNOSIS — R531 Weakness: Secondary | ICD-10-CM | POA: Diagnosis present

## 2019-12-15 DIAGNOSIS — N3000 Acute cystitis without hematuria: Secondary | ICD-10-CM | POA: Diagnosis not present

## 2019-12-15 DIAGNOSIS — N184 Chronic kidney disease, stage 4 (severe): Secondary | ICD-10-CM | POA: Diagnosis present

## 2019-12-15 DIAGNOSIS — K219 Gastro-esophageal reflux disease without esophagitis: Secondary | ICD-10-CM | POA: Diagnosis present

## 2019-12-15 DIAGNOSIS — Z87891 Personal history of nicotine dependence: Secondary | ICD-10-CM | POA: Diagnosis not present

## 2019-12-15 DIAGNOSIS — Z515 Encounter for palliative care: Secondary | ICD-10-CM | POA: Diagnosis not present

## 2019-12-15 DIAGNOSIS — Z833 Family history of diabetes mellitus: Secondary | ICD-10-CM

## 2019-12-15 DIAGNOSIS — Z7189 Other specified counseling: Secondary | ICD-10-CM | POA: Insufficient documentation

## 2019-12-15 DIAGNOSIS — N179 Acute kidney failure, unspecified: Secondary | ICD-10-CM

## 2019-12-15 DIAGNOSIS — N63 Unspecified lump in unspecified breast: Secondary | ICD-10-CM

## 2019-12-15 DIAGNOSIS — D808 Other immunodeficiencies with predominantly antibody defects: Secondary | ICD-10-CM | POA: Diagnosis present

## 2019-12-15 DIAGNOSIS — B952 Enterococcus as the cause of diseases classified elsewhere: Secondary | ICD-10-CM | POA: Diagnosis present

## 2019-12-15 DIAGNOSIS — Z743 Need for continuous supervision: Secondary | ICD-10-CM | POA: Diagnosis not present

## 2019-12-15 DIAGNOSIS — Z8051 Family history of malignant neoplasm of kidney: Secondary | ICD-10-CM | POA: Diagnosis not present

## 2019-12-15 DIAGNOSIS — Z5111 Encounter for antineoplastic chemotherapy: Principal | ICD-10-CM

## 2019-12-15 DIAGNOSIS — E785 Hyperlipidemia, unspecified: Secondary | ICD-10-CM

## 2019-12-15 DIAGNOSIS — E039 Hypothyroidism, unspecified: Secondary | ICD-10-CM | POA: Diagnosis present

## 2019-12-15 DIAGNOSIS — I10 Essential (primary) hypertension: Secondary | ICD-10-CM | POA: Diagnosis present

## 2019-12-15 DIAGNOSIS — N139 Obstructive and reflux uropathy, unspecified: Secondary | ICD-10-CM | POA: Diagnosis present

## 2019-12-15 DIAGNOSIS — M4856XD Collapsed vertebra, not elsewhere classified, lumbar region, subsequent encounter for fracture with routine healing: Secondary | ICD-10-CM | POA: Diagnosis present

## 2019-12-15 DIAGNOSIS — C8590 Non-Hodgkin lymphoma, unspecified, unspecified site: Secondary | ICD-10-CM | POA: Diagnosis not present

## 2019-12-15 DIAGNOSIS — Z7401 Bed confinement status: Secondary | ICD-10-CM | POA: Diagnosis not present

## 2019-12-15 DIAGNOSIS — G3184 Mild cognitive impairment, so stated: Secondary | ICD-10-CM | POA: Diagnosis present

## 2019-12-15 DIAGNOSIS — Z66 Do not resuscitate: Secondary | ICD-10-CM | POA: Diagnosis present

## 2019-12-15 DIAGNOSIS — N189 Chronic kidney disease, unspecified: Secondary | ICD-10-CM

## 2019-12-15 DIAGNOSIS — N136 Pyonephrosis: Secondary | ICD-10-CM | POA: Diagnosis present

## 2019-12-15 DIAGNOSIS — N1330 Unspecified hydronephrosis: Secondary | ICD-10-CM | POA: Diagnosis not present

## 2019-12-15 DIAGNOSIS — R0902 Hypoxemia: Secondary | ICD-10-CM | POA: Diagnosis not present

## 2019-12-15 DIAGNOSIS — M2550 Pain in unspecified joint: Secondary | ICD-10-CM | POA: Diagnosis not present

## 2019-12-15 DIAGNOSIS — Z936 Other artificial openings of urinary tract status: Secondary | ICD-10-CM

## 2019-12-15 DIAGNOSIS — R4182 Altered mental status, unspecified: Secondary | ICD-10-CM | POA: Diagnosis not present

## 2019-12-15 LAB — BASIC METABOLIC PANEL
Anion gap: 13 (ref 5–15)
BUN: 30 mg/dL — ABNORMAL HIGH (ref 8–23)
CO2: 22 mmol/L (ref 22–32)
Calcium: 8.9 mg/dL (ref 8.9–10.3)
Chloride: 107 mmol/L (ref 98–111)
Creatinine, Ser: 3.24 mg/dL — ABNORMAL HIGH (ref 0.44–1.00)
GFR calc Af Amer: 15 mL/min — ABNORMAL LOW (ref >60)
GFR calc non Af Amer: 13 mL/min — ABNORMAL LOW (ref >60)
Glucose, Bld: 94 mg/dL (ref 70–99)
Potassium: 3.5 mmol/L (ref 3.5–5.1)
Sodium: 142 mmol/L (ref 135–145)

## 2019-12-15 LAB — GRAM STAIN

## 2019-12-15 LAB — CYTOLOGY - NON PAP

## 2019-12-15 LAB — CBC WITH DIFFERENTIAL/PLATELET
Abs Immature Granulocytes: 0.01 10*3/uL (ref 0.00–0.07)
Basophils Absolute: 0 10*3/uL (ref 0.0–0.1)
Basophils Relative: 1 %
Eosinophils Absolute: 0.1 10*3/uL (ref 0.0–0.5)
Eosinophils Relative: 2 %
HCT: 39.6 % (ref 36.0–46.0)
Hemoglobin: 13 g/dL (ref 12.0–15.0)
Immature Granulocytes: 0 %
Lymphocytes Relative: 15 %
Lymphs Abs: 0.7 10*3/uL (ref 0.7–4.0)
MCH: 30.9 pg (ref 26.0–34.0)
MCHC: 32.8 g/dL (ref 30.0–36.0)
MCV: 94.1 fL (ref 80.0–100.0)
Monocytes Absolute: 0.7 10*3/uL (ref 0.1–1.0)
Monocytes Relative: 15 %
Neutro Abs: 3 10*3/uL (ref 1.7–7.7)
Neutrophils Relative %: 67 %
Platelets: 151 10*3/uL (ref 150–400)
RBC: 4.21 MIL/uL (ref 3.87–5.11)
RDW: 12.1 % (ref 11.5–15.5)
WBC: 4.4 10*3/uL (ref 4.0–10.5)
nRBC: 0 % (ref 0.0–0.2)

## 2019-12-15 MED ORDER — ONDANSETRON HCL 4 MG/2ML IJ SOLN: 4.0000 mg | Freq: Three times a day (TID) | INTRAMUSCULAR | Status: DC | PRN

## 2019-12-15 MED ORDER — SODIUM CHLORIDE 0.9 % IV SOLN: 250.0000 mL | INTRAVENOUS | Status: DC | PRN

## 2019-12-15 MED ORDER — AMOXICILLIN 250 MG PO CAPS: 250.0000 mg | ORAL_CAPSULE | Freq: Two times a day (BID) | ORAL | 0 refills | Status: DC

## 2019-12-15 MED ORDER — MIDAZOLAM HCL 2 MG/2ML IJ SOLN
INTRAMUSCULAR | Status: AC | PRN
  Administered 2019-12-15: 0.5 mg via INTRAVENOUS

## 2019-12-15 MED ORDER — ACETAMINOPHEN 325 MG PO TABS
650.0000 mg | ORAL_TABLET | ORAL | Status: DC | PRN
  Administered 2019-12-16 – 2019-12-17 (×2): 650 mg via ORAL
  Filled 2019-12-15 (×2): qty 2

## 2019-12-15 MED ORDER — SODIUM CHLORIDE 0.9% FLUSH: 3.0000 mL | INTRAVENOUS | Status: DC | PRN

## 2019-12-15 MED ORDER — MEGESTROL ACETATE 20 MG PO TABS
40.0000 mg | ORAL_TABLET | Freq: Every day | ORAL | Status: DC
  Administered 2019-12-16 – 2019-12-18 (×3): 40 mg via ORAL
  Filled 2019-12-15 (×4): qty 2

## 2019-12-15 MED ORDER — SENNOSIDES-DOCUSATE SODIUM 8.6-50 MG PO TABS: 1.0000 | ORAL_TABLET | Freq: Every evening | ORAL | Status: DC | PRN

## 2019-12-15 MED ORDER — DONEPEZIL HCL 5 MG PO TABS
5.0000 mg | ORAL_TABLET | Freq: Every day | ORAL | Status: DC
  Administered 2019-12-15 – 2019-12-17 (×3): 5 mg via ORAL
  Filled 2019-12-15 (×3): qty 1

## 2019-12-15 MED ORDER — PANTOPRAZOLE SODIUM 40 MG PO TBEC
40.0000 mg | DELAYED_RELEASE_TABLET | Freq: Every day | ORAL | Status: DC
  Administered 2019-12-16 – 2019-12-18 (×3): 40 mg via ORAL
  Filled 2019-12-15 (×4): qty 1

## 2019-12-15 MED ORDER — ENOXAPARIN SODIUM 30 MG/0.3ML ~~LOC~~ SOLN: 30.0000 mg | SUBCUTANEOUS | Status: DC

## 2019-12-15 MED ORDER — AMOXICILLIN 250 MG PO CAPS
250.0000 mg | ORAL_CAPSULE | Freq: Two times a day (BID) | ORAL | Status: DC
  Administered 2019-12-15 – 2019-12-18 (×6): 250 mg via ORAL
  Filled 2019-12-15 (×7): qty 1

## 2019-12-15 MED ORDER — GUAIFENESIN-DM 100-10 MG/5ML PO SYRP: 10.0000 mL | ORAL_SOLUTION | ORAL | Status: DC | PRN

## 2019-12-15 MED ORDER — LEVOTHYROXINE SODIUM 50 MCG PO TABS
75.0000 ug | ORAL_TABLET | Freq: Every day | ORAL | Status: DC
  Administered 2019-12-16 – 2019-12-18 (×3): 75 ug via ORAL
  Filled 2019-12-15 (×3): qty 1

## 2019-12-15 MED ORDER — ENOXAPARIN SODIUM 30 MG/0.3ML ~~LOC~~ SOLN
30.0000 mg | SUBCUTANEOUS | Status: DC
  Administered 2019-12-15 – 2019-12-17 (×3): 30 mg via SUBCUTANEOUS
  Filled 2019-12-15 (×3): qty 0.3

## 2019-12-15 MED ORDER — SODIUM BICARBONATE 650 MG PO TABS: 1300.0000 mg | ORAL_TABLET | Freq: Two times a day (BID) | ORAL | Status: DC

## 2019-12-15 MED ORDER — ATORVASTATIN CALCIUM 10 MG PO TABS
10.0000 mg | ORAL_TABLET | Freq: Every day | ORAL | Status: DC
  Administered 2019-12-16 – 2019-12-18 (×3): 10 mg via ORAL
  Filled 2019-12-15 (×4): qty 1

## 2019-12-15 MED ORDER — ONDANSETRON 4 MG PO TBDP
4.0000 mg | ORAL_TABLET | Freq: Three times a day (TID) | ORAL | Status: DC | PRN
  Administered 2019-12-18: 8 mg via ORAL
  Filled 2019-12-15 (×2): qty 2

## 2019-12-15 MED ORDER — GABAPENTIN 400 MG PO CAPS
400.0000 mg | ORAL_CAPSULE | Freq: Two times a day (BID) | ORAL | Status: DC
  Administered 2019-12-15 – 2019-12-18 (×6): 400 mg via ORAL
  Filled 2019-12-15 (×6): qty 1

## 2019-12-15 MED ORDER — HYDROCORTISONE (PERIANAL) 2.5 % EX CREA
1.0000 "application " | TOPICAL_CREAM | Freq: Two times a day (BID) | CUTANEOUS | Status: DC | PRN
  Filled 2019-12-15: qty 28.35

## 2019-12-15 MED ORDER — SODIUM CHLORIDE 0.9 % IV SOLN
8.0000 mg | Freq: Three times a day (TID) | INTRAVENOUS | Status: DC | PRN
  Filled 2019-12-15: qty 4

## 2019-12-15 MED ORDER — MIDAZOLAM HCL 2 MG/2ML IJ SOLN
INTRAMUSCULAR | Status: AC
  Filled 2019-12-15: qty 2

## 2019-12-15 MED ORDER — TRAMADOL HCL 50 MG PO TABS
50.0000 mg | ORAL_TABLET | Freq: Three times a day (TID) | ORAL | Status: DC | PRN
  Administered 2019-12-15 – 2019-12-18 (×5): 50 mg via ORAL
  Filled 2019-12-15 (×5): qty 1

## 2019-12-15 MED ORDER — ALUM & MAG HYDROXIDE-SIMETH 200-200-20 MG/5ML PO SUSP: 60.0000 mL | ORAL | Status: DC | PRN

## 2019-12-15 MED ORDER — OXYCODONE HCL 5 MG PO TABS
5.0000 mg | ORAL_TABLET | ORAL | Status: DC | PRN
  Administered 2019-12-15 – 2019-12-18 (×6): 5 mg via ORAL
  Filled 2019-12-15 (×6): qty 1

## 2019-12-15 MED ORDER — SODIUM CHLORIDE 0.9% FLUSH
3.0000 mL | Freq: Two times a day (BID) | INTRAVENOUS | Status: DC
  Administered 2019-12-16 – 2019-12-18 (×4): 3 mL via INTRAVENOUS

## 2019-12-15 MED ORDER — ASPIRIN EC 81 MG PO TBEC
81.0000 mg | DELAYED_RELEASE_TABLET | Freq: Every day | ORAL | Status: DC
  Administered 2019-12-16 – 2019-12-18 (×3): 81 mg via ORAL
  Filled 2019-12-15 (×4): qty 1

## 2019-12-15 MED ORDER — FENTANYL CITRATE (PF) 100 MCG/2ML IJ SOLN
INTRAMUSCULAR | Status: AC
  Filled 2019-12-15: qty 2

## 2019-12-15 MED ORDER — FENTANYL CITRATE (PF) 100 MCG/2ML IJ SOLN
INTRAMUSCULAR | Status: AC | PRN
  Administered 2019-12-15: 25 ug via INTRAVENOUS

## 2019-12-15 MED ORDER — LABETALOL HCL 5 MG/ML IV SOLN: 5.0000 mg | INTRAVENOUS | Status: DC | PRN

## 2019-12-15 MED ORDER — ONDANSETRON HCL 4 MG PO TABS: 4.0000 mg | ORAL_TABLET | Freq: Three times a day (TID) | ORAL | Status: DC | PRN

## 2019-12-15 NOTE — Plan of Care (Signed)
  Problem: Clinical Measurements: Goal: Ability to maintain clinical measurements within normal limits will improve Outcome: Progressing   

## 2019-12-15 NOTE — Consult Note (Signed)
   [facility] CM Inpatient Consult   12/15/2019  Regina Harris Nov 09, 1935 [id number]    Update Note: [facility] ACTIVE Status)   Following this Medicare patient's disposition.  MD and Palliative Care notes reveal disposition plan of  transferring patient to [facility]) for inpatient chemo after bone marrow biopsy is completed by [facility] IR. Spoke with transition of care CM who confirmed patient's discharge disposition and made aware of patient being active with [facility]) care Nurse, children's.  Chart review states that patient will discharge home a few days after chemo.  If she is improved, will continue [facility]iative at home and she will continue chemo.   If she is not improved, will discharge her to home with [facility] services.  Plan: Will notify hospital liaison [facility]) and [facility] RN CM of plan for disposition, for follow-up as appropriate.   For questions and additional information, please call:  Kseniya Grunden A. Revan Gendron, BSN, RN-BC [facility] Liaison Cell: [phone]

## 2019-12-15 NOTE — Progress Notes (Signed)
Regina Harris to be discharged [facility] per MD order. Patient verbalized understanding.  Skin clean, dry and intact without evidence of skin break down, no evidence of skin tears noted. IV catheter discontinued intact. Site without signs and symptoms of complications. Dressing and pressure applied. Pt denies pain at the site currently. No complaints noted.  Patient free of lines, drains, and wounds.   Discharge packet assembled. An After Visit Summary (AVS) was printed and given to the [facility] and family personnel. Patient escorted via stretcher and discharged to [facility]. Report called to accepting [facility]; all questions and concerns addressed.   Baldo Ash, RN

## 2019-12-15 NOTE — Consult Note (Signed)
[place]  [phone] [phone]  ID: Regina Harris OB: [birth year]  MR#: [id number]  GGY#:[id number]  Patient Care Team: Abner Greenspan, MD as PCP - General Byrnett, Forest Gleason, MD (General Surgery) Tobi Bastos, RN as [place] [facility]  CHIEF COMPLAINT: Kappa chain myeloma  INTERVAL HISTORY: Patient is an 84 year old female who was recently admitted to [facility] with acute renal failure and hypertensive urgency.  Final pathology was also resulted around the same time with high suspicion of a kappa chain myeloma.  She received a nephrostomy tube as well as a thoracentesis over the weekend.  Cytology from thoracentesis confirmed a plasma cell dyscrasia.  Patient had a bone marrow biopsy this morning prior to transfer.  She is lethargic, but otherwise feels well.  She has no neurologic complaints.  She has increased weakness and fatigue.  She does not complain of pain.  She has no chest pain, shortness of breath, cough, or hemoptysis.  She denies any nausea, vomiting, constipation, or diarrhea.  She has no urinary complaints and her nephrostomy tube is functioning appropriately.  Patient offers no further specific complaints today.  REVIEW OF SYSTEMS:   Review of Systems  Constitutional: Positive for malaise/fatigue. Negative for fever and weight loss.  Respiratory: Negative.  Negative for cough and shortness of breath.   Cardiovascular: Negative.  Negative for chest pain and leg swelling.  Gastrointestinal: Negative.  Negative for abdominal pain.  Genitourinary: Negative.  Negative for dysuria.  Musculoskeletal: Negative.  Negative for back pain.  Skin: Negative.  Negative for rash.  Neurological: Positive for weakness. Negative for dizziness, focal weakness and headaches.  Psychiatric/Behavioral: The patient is not nervous/anxious.     As per HPI. Otherwise, a complete review of systems is negative.  PAST MEDICAL  HISTORY: Past Medical History:  Diagnosis Date  . Barrett esophagus   . Carotid bruit   . Diabetes mellitus without complication ([place])   . Esophagitis   . Gastritis 2013  . GERD (gastroesophageal reflux disease)   . HH (hiatus hernia)   . History of colonic polyps   . History of repair of right rotator cuff   . Hyperlipidemia   . Hypertension   . Hyperthyroidism 06/09/2018   Per [facility] RAI Therapy for Hyperthyroidism order  . Lump or mass in breast   . Pneumonia   . Polycythemia, secondary   . Tobacco abuse     PAST SURGICAL HISTORY: Past Surgical History:  Procedure Laterality Date  . ABDOMINAL HYSTERECTOMY    . APPENDECTOMY    . BREAST BIOPSY Right 1996  . BREAST BIOPSY Left 10/09/2012   Benign breast tissue with focal fat necrosis and focal periductal chronic inflammation.  Marland Kitchen BREAST BIOPSY Left 10/09/2012   Benign breast tissue with focal fat necrosis and focal periductal chronic inflammation.  Marland Kitchen CATARACT EXTRACTION  3/11   Dr Charise Killian  . CHOLECYSTECTOMY    . COLONOSCOPY  2009  . CYSTOSCOPY WITH URETEROSCOPY AND STENT PLACEMENT Left 11/19/2019   Procedure: CYSTOSCOPY WITH URETEROSCOPY AND STENT PLACEMENT;  Surgeon: Billey Co, MD;  Location: [facility] ORS;  Service: Urology;  Laterality: Left;  . ESOPHAGOGASTRODUODENOSCOPY (EGD) WITH PROPOFOL N/A 11/02/2015   Procedure: ESOPHAGOGASTRODUODENOSCOPY (EGD) WITH PROPOFOL;  Surgeon: Manya Silvas, MD;  Location: [facility] ENDOSCOPY;  Service: Endoscopy;  Laterality: N/A;  . IR NEPHROSTOMY PLACEMENT LEFT  12/13/2019  . SHOULDER ARTHROSCOPY WITH OPEN ROTATOR CUFF REPAIR Right 03/05/2016   Procedure: SHOULDER ARTHROSCOPY WITH OPEN ROTATOR  CUFF REPAIR;  Surgeon: Earnestine Leys, MD;  Location: [facility] ORS;  Service: Orthopedics;  Laterality: Right;  . UPPER GI ENDOSCOPY  2013    FAMILY HISTORY: Family History  Problem Relation Age of Onset  . Kidney cancer Father   . Diabetes Mother   . Breast cancer Neg Hx     ADVANCED DIRECTIVES (Y/N):   @ADVDIR @  HEALTH MAINTENANCE: Social History   Tobacco Use  . Smoking status: Former Smoker    Packs/day: 1.00    Types: Cigarettes  . Smokeless tobacco: Never Used  Substance Use Topics  . Alcohol use: No    Alcohol/week: 0.0 standard drinks  . Drug use: No     Colonoscopy:  PAP:  Bone density:  Lipid panel:  Allergies  Allergen Reactions  . Benadryl [Diphenhydramine] Other (See Comments)    Hallucinations; family request to not give this medication.    Current Facility-Administered Medications  Medication Dose Route Frequency Provider Last Rate Last Admin  . 0.9 %  sodium chloride infusion  250 mL Intravenous PRN Lloyd Huger, MD      . acetaminophen (TYLENOL) tablet 650 mg  650 mg Oral Q4H PRN Lloyd Huger, MD      . alum & mag hydroxide-simeth (MAALOX/MYLANTA) 200-200-20 MG/5ML suspension 60 mL  60 mL Oral Q4H PRN Lloyd Huger, MD      . amoxicillin (AMOXIL) capsule 250 mg  250 mg Oral Q12H Lloyd Huger, MD      . aspirin EC tablet 81 mg  81 mg Oral Daily Lloyd Huger, MD      . atorvastatin (LIPITOR) tablet 10 mg  10 mg Oral Daily Lloyd Huger, MD      . donepezil (ARICEPT) tablet 5 mg  5 mg Oral QHS Lloyd Huger, MD      . enoxaparin (LOVENOX) injection 30 mg  30 mg Subcutaneous Q24H Lloyd Huger, MD      . gabapentin (NEURONTIN) capsule 400 mg  400 mg Oral BID Lloyd Huger, MD      . guaiFENesin-dextromethorphan (ROBITUSSIN DM) 100-10 MG/5ML syrup 10 mL  10 mL Oral Q4H PRN Lloyd Huger, MD      . hydrocortisone (ANUSOL-HC) 2.5 % rectal cream 1 application  1 application Rectal BID PRN Lloyd Huger, MD      . Derrill Memo ON 12/16/2019] levothyroxine (SYNTHROID) tablet 75 mcg  75 mcg Oral Q breakfast Lloyd Huger, MD      . megestrol (MEGACE) tablet 40 mg  40 mg Oral Daily Lloyd Huger, MD      . ondansetron [facility]) tablet 4-8 mg  4-8 mg Oral Q8H PRN Lloyd Huger, MD       Or    . ondansetron (ZOFRAN-ODT) disintegrating tablet 4-8 mg  4-8 mg Oral Q8H PRN Lloyd Huger, MD       Or  . ondansetron (ZOFRAN) injection 4 mg  4 mg Intravenous Q8H PRN Lloyd Huger, MD       Or  . ondansetron (ZOFRAN) 8 mg in sodium chloride 0.9 % 50 mL IVPB  8 mg Intravenous Q8H PRN Lloyd Huger, MD      . pantoprazole (PROTONIX) EC tablet 40 mg  40 mg Oral Daily Lloyd Huger, MD      . senna-docusate (Senokot-S) tablet 1 tablet  1 tablet Oral QHS PRN Lloyd Huger, MD      . sodium chloride flush (NS) 0.9 %  injection 3 mL  3 mL Intravenous Q12H Riddik Senna, Kathlene November, MD      . sodium chloride flush (NS) 0.9 % injection 3 mL  3 mL Intravenous PRN Lloyd Huger, MD      . traMADol Veatrice Bourbon) tablet 50 mg  50 mg Oral Q8H PRN Lloyd Huger, MD        OBJECTIVE: Vitals:   12/15/19 1307 12/15/19 1556  BP: (!) 167/87 (!) 163/84  Pulse: (!) 105 96  Resp: 20 16  Temp: 97.6 F (36.4 C) 98 F (36.7 C)  SpO2: 95% 96%     There is no height or weight on file to calculate BMI.    ECOG FS:3 - Symptomatic, >50% confined to bed  General: Ill-appearing, no acute distress. Eyes: Pink conjunctiva, anicteric sclera. HEENT: Normocephalic, moist mucous membranes. Lungs: No audible wheezing or coughing. Heart: Regular rate and rhythm. Abdomen: Soft, nontender, no obvious distention. Musculoskeletal: No edema, cyanosis, or clubbing.  Nephrostomy tube noted draining clear yellow urine. Neuro: Alert, answering all questions appropriately. Cranial nerves grossly intact. Skin: No rashes or petechiae noted. Psych: Normal affect. Lymphatics: No cervical, calvicular, axillary or inguinal LAD.   LAB RESULTS:  Lab Results  Component Value Date   NA 142 12/15/2019   K 3.5 12/15/2019   CL 107 12/15/2019   CO2 22 12/15/2019   GLUCOSE 94 12/15/2019   BUN 30 (H) 12/15/2019   CREATININE 3.24 (H) 12/15/2019   CALCIUM 8.9 12/15/2019   PROT 7.9 12/12/2019    ALBUMIN 2.8 (L) 12/12/2019   AST 18 12/12/2019   ALT 10 12/12/2019   ALKPHOS 49 12/12/2019   BILITOT 0.5 12/12/2019   GFRNONAA 13 (L) 12/15/2019   GFRAA 15 (L) 12/15/2019    Lab Results  Component Value Date   WBC 4.4 12/15/2019   NEUTROABS 3.0 12/15/2019   HGB 13.0 12/15/2019   HCT 39.6 12/15/2019   MCV 94.1 12/15/2019   PLT 151 12/15/2019     STUDIES: CT ABDOMEN PELVIS WO CONTRAST  Result Date: 11/17/2019 CLINICAL DATA:  84 year old female with left-sided abdominal pain. Concern for acute diverticulitis. EXAM: CT ABDOMEN AND PELVIS WITHOUT CONTRAST TECHNIQUE: Multidetector CT imaging of the abdomen and pelvis was performed following the standard protocol without IV contrast. COMPARISON:  CT abdomen pelvis dated 06/13/2006. FINDINGS: Evaluation of this exam is limited in the absence of intravenous contrast. Lower chest: Trace left pleural effusion. There is thickened appearance of the posteromedial lower lobe pleural surfaces. Partially visualized infiltrative mass encasing the distal descending thoracic aorta and abutting the lower thoracic spine at T10-T11. This may represent an infiltrative neoplasm, lymphoma, or fibrosis versus sequela osteomyelitis/discitis. This is new since the CT of 07/17/2010. Further evaluation with MRI without and with contrast is recommended. Bibasilar subpleural linear and streaky densities may represent atelectasis/scarring. Infiltrate is less likely. Clinical correlation is recommended. There is no intra-abdominal free air or free fluid. Hepatobiliary: The liver is unremarkable. No intrahepatic biliary ductal dilatation. Cholecystectomy. No retained calcified stone noted in the central CBD. Pancreas: There is mild haziness of the distal pancreas which may represent acute pancreatitis. Correlation with pancreatic enzymes recommended. No drainable fluid collection/abscess or pseudocyst Spleen: Normal in size without focal abnormality. Adrenals/Urinary Tract:  There is a 2 cm left adrenal adenoma. The right adrenal gland is unremarkable. There is mild left hydronephrosis. No calcified stone identified. There is haziness and stranding of the proximal left periureteric fat which may represent an infection/inflammatory process or fibrosis and possible degree  of ureteral stricture. There is a 2 cm left renal inferior pole hypodense lesion which is not characterized but demonstrates fluid attenuation most consistent with a cyst. There is no hydronephrosis or nephrolithiasis on the right. Multiple right renal cysts measure up to approximately 7 cm. The right ureter and urinary bladder appear unremarkable. Stomach/Bowel: There is sigmoid diverticulosis with muscular hypertrophy. Mild perisigmoid stranding and haziness may represent mild acute diverticulitis. Clinical correlation is recommended. There is diffuse thickened appearance of the colon which may be related to underdistention. Diffuse submucosal fat deposit along the colonic wall likely sequela of chronic inflammatory process. There is no bowel obstruction. Appendectomy. Vascular/Lymphatic: Advanced aortoiliac atherosclerotic disease. There is stranding of the periaortic fat. There is a 3.8 x 2.1 cm left para-aortic/paraspinal soft tissue lesion which is not characterized but may represent enlarged lymph node (series 2 image 38). However, this soft tissue appears to abut the left L4 vertebra and therefore a neoplasm or an infectious process is not excluded. No portal venous gas. The IVC is unremarkable. Reproductive: Hysterectomy. Other: None Musculoskeletal: Osteopenia with extensive multilevel degenerative changes, disc desiccation and vacuum phenomena. Old-appearing multilevel compression fractures most prominent involving L3 and L4. No definite acute fracture. No retropulsed fragment. IMPRESSION: 1. Sigmoid diverticulosis with findings concerning for mild diverticulitis. No diverticular abscess or perforation. 2. No  bowel obstruction. 3. Mild left hydronephrosis, possibly secondary to a degree of stricture or decreased peristalsis of the left ureter due to adjacent inflammatory process or fibrosis. No stone. 4. Mild stranding of the distal pancreas. Correlation with pancreatic enzymes recommended to evaluate for acute pancreatitis. No fluid collection. 5. Infiltrative soft tissue mass along the lower thoracic spine at T10-T11 with encasement of the aorta. This may represent an inflammatory/infectious process (osteomyelitis/discitis) or an infiltrative neoplasm. Further evaluation with MRI without and with contrast is recommended. 6. Thickened appearance of the inferior medial lower lobe pleural surfaces which may be secondary to infiltration of paraspinal/periaortic mass or sequela of chronic inflammation. 7. Left para-aortic soft tissue density to the left of L4 may represent adenopathy or a neoplasm. Electronically Signed   By: Anner Crete M.D.   On: 11/17/2019 18:09   DG Chest 1 View  Result Date: 12/12/2019 CLINICAL DATA:  Status post LEFT thoracentesis. EXAM: CHEST  1 VIEW COMPARISON:  12/12/2019 FINDINGS: LEFT pleural effusion has decreased, now trace. LEFT basilar atelectasis has improved. A trace RIGHT pleural effusion may be present. There is no evidence of pneumothorax. IMPRESSION: Decreased LEFT pleural effusion, now trace, with improved LEFT basilar atelectasis. No evidence of pneumothorax. Electronically Signed   By: Margarette Canada M.D.   On: 12/12/2019 10:49   DG Knee 2 Views Left  Result Date: 12/12/2019 CLINICAL DATA:  Knee pain EXAM: LEFT KNEE - 1-2 VIEW COMPARISON:  None. FINDINGS: No fracture or dislocation. Tricompartmental osteoarthritis is noted with joint space loss and subchondral sclerosis. No large knee joint effusion. Patellar enthesopathy seen. Vascular calcifications are noted. IMPRESSION: No acute osseous abnormality. Electronically Signed   By: Prudencio Pair M.D.   On: 12/12/2019 03:35    DG Abd 1 View  Result Date: 12/11/2019 CLINICAL DATA:  Ureteral stent. Lower abdominal pain. EXAM: ABDOMEN - 1 VIEW COMPARISON:  November 17, 2019. FINDINGS: The bowel gas pattern is normal. Status post cholecystectomy. Left ureteral stent is noted in grossly good position. Phleboliths are noted in the pelvis. No nephrolithiasis is noted. IMPRESSION: Left ureteral stent is noted in grossly good position. No evidence of bowel obstruction or  ileus. Electronically Signed   By: Marijo Conception M.D.   On: 12/11/2019 16:30   CT Head Wo Contrast  Result Date: 12/12/2019 CLINICAL DATA:  Fall weakness EXAM: CT HEAD WITHOUT CONTRAST TECHNIQUE: Contiguous axial images were obtained from the base of the skull through the vertex without intravenous contrast. COMPARISON:  December 07, 2019 brain MRI FINDINGS: Brain: No evidence of acute territorial infarction, hemorrhage, hydrocephalus,extra-axial collection or mass lesion/mass effect. There is dilatation the ventricles and sulci consistent with age-related atrophy. Low-attenuation changes in the deep white matter consistent with small vessel ischemia. Vascular: No hyperdense vessel or unexpected calcification. Skull: The skull is intact. No fracture or focal lesion identified. Sinuses/Orbits: The visualized paranasal sinuses and mastoid air cells are clear. The orbits and globes intact. Other: Small laceration seen over the posterior left parietal skull. IMPRESSION: No acute intracranial abnormality. Findings consistent with age related atrophy and chronic small vessel ischemia Electronically Signed   By: Prudencio Pair M.D.   On: 12/12/2019 00:45   CT CHEST WO CONTRAST  Result Date: 11/18/2019 CLINICAL DATA:  Adenopathy EXAM: CT CHEST WITHOUT CONTRAST TECHNIQUE: Multidetector CT imaging of the chest was performed following the standard protocol without IV contrast. COMPARISON:  Chest CT 07/17/2010 FINDINGS: Cardiovascular: Normal heart size. No pericardial effusion.  Aortic and coronary atherosclerosis. Mediastinum/Nodes: Infiltrative posterior mediastinal mass partially encasing the descending aorta with indistinguishable plane between the mass and the posterior wall of the aorta separate from areas of intimal calcification. The infiltrative abnormality extends approximately 9 cm craniocaudal and 5.6 cm in diameter. The hila and other mediastinal spaces are negative for adenopathy. Lungs/Pleura: Small pleural effusions on both sides. There is subpleural extension of the mass with no visible nodularity along the pleural surface, although limited without contrast. Dependent atelectasis. No worrisome pulmonary nodules. Upper Abdomen: Partial coverage shows left hydronephrosis likely due to the retroperitoneal mass medial to the left psoas on prior CT. Right renal cystic change and atherosclerotic calcification. Cholecystectomy. Musculoskeletal: The posterior mediastinal mass is in close proximity to the spine but does not cause visible erosion or infiltrative lucency. No gross spinal canal mass. No evidence of hematogenous osseous metastases. IMPRESSION: 1. Approximately 9 x 6 cm infiltrative posterior mediastinal mass intimately associated with the descending aorta and thoracic spine. 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 2. Known left hydronephrosis related to infiltrative mass in the retroperitoneum. 3. Small pleural effusions with lower lobe atelectasis. Electronically Signed   By: Monte Fantasia M.D.   On: 11/18/2019 11:27   CT GUIDED NEEDLE PLACEMENT  Result Date: 11/20/2019 INDICATION: No known primary, now with retroperitoneal mass worrisome for lymphoma. Please perform CT-guided biopsy for tissue diagnostic purposes. EXAM: CT GUIDANCE NEEDLE PLACEMENT COMPARISON:  CT abdomen and pelvis-09/16/2020 MEDICATIONS: None. ANESTHESIA/SEDATION: Fentanyl 50 mcg IV; Versed 1 mg IV Sedation time: 16 minutes; The patient was continuously monitored during the procedure by the interventional  radiology nurse under my direct supervision. CONTRAST:  None. COMPLICATIONS: None immediate. PROCEDURE: Informed consent was obtained from the patient following an explanation of the procedure, risks, benefits and alternatives. A time out was performed prior to the initiation of the procedure. The patient was positioned prone on the CT table and a limited CT was performed for procedural planning demonstrating unchanged size and appearance the left-sided retroperitoneal mass, presumed nodal conglomeration with dominant component measuring approximately 4.5 x 2.6 cm (image 21, series 3). The procedure was planned. The operative site was prepped and draped in the usual sterile fashion. Appropriate trajectory was confirmed with  a 22 gauge spinal needle after the adjacent tissues were anesthetized with 1% Lidocaine with epinephrine. Under intermittent CT guidance, a 17 gauge coaxial needle was advanced into the peripheral aspect of the mass. Appropriate positioning was confirmed and 6 core needle biopsy samples were obtained with an 18 gauge core needle biopsy device. The co-axial needle was removed following the administration of a Gel-Foam slurry and superficial hemostasis was achieved with manual compression. A limited postprocedural CT was negative for hemorrhage or additional complication. A dressing was placed. The patient tolerated the procedure well without immediate postprocedural complication. IMPRESSION: Technically successful CT guided core needle biopsy of left-sided retroperitoneal mass, presumed nodal conglomeration. Electronically Signed   By: Sandi Mariscal M.D.   On: 11/20/2019 09:43   MR Brain W Wo Contrast  Result Date: 12/07/2019 CLINICAL DATA:  Lymphoma. EXAM: MRI HEAD WITHOUT AND WITH CONTRAST TECHNIQUE: Multiplanar, multiecho pulse sequences of the brain and surrounding structures were obtained without and with intravenous contrast. CONTRAST:  35m GADAVIST GADOBUTROL 1 MMOL/ML IV SOLN COMPARISON:   Head CT 07/29/2019 FINDINGS: The study is moderately motion degraded. Brain: There is no evidence of acute infarct, intracranial hemorrhage, mass, midline shift, or extra-axial fluid collection. There is mild to moderate cerebral atrophy. T2 hyperintensities in the cerebral white matter bilaterally are nonspecific but compatible with mild chronic small vessel ischemic disease. No enhancing lesions are identified, however note that small lesions could be easily obscured by the degree of motion artifact. Vascular: Major intracranial vascular flow voids are preserved. Skull and upper cervical spine: No gross skull lesion identified. Sinuses/Orbits: Bilateral cataract extraction. Mild right sphenoid sinus mucosal thickening. Small left mastoid effusion. Other: None. IMPRESSION: 1. Motion degraded examination without evidence of intracranial metastases. 2. Mild chronic small vessel ischemic disease. Electronically Signed   By: ALogan BoresM.D.   On: 12/07/2019 15:52   NM Renal Imaging Flow W/O Pharm  Result Date: 12/14/2019 CLINICAL DATA:  Hydronephrosis, abnormal CT, LEFT renal collecting system dilatation with prior stenting, RIGHT hydronephrosis with tumor invasion of RIGHT renal hilum, history lymphoma EXAM: NUCLEAR MEDICINE RENAL SCAN TECHNIQUE: Radionuclide angiographic and sequential renal images were obtained after intravenous injection of radiopharmaceutical. Lasix not administered due to new acute renal failure. RADIOPHARMACEUTICALS:  5.38 mCi Technetium-[id number] IV COMPARISON:  CT abdomen and pelvis 12/12/2019 FINDINGS: Blood flow: Poor tracer bolus. Probable delayed blood flow to both kidneys though this is suboptimally assessed. LEFT kidney: Delayed uptake, concentration and excretion of tracer by LEFT kidney. Central photopenia early in exam corresponds to mildly dilated collecting system. Delayed excretion of into the dilated collecting system. Significant retained tracer within the dilated collecting  system and the renal cortex at the conclusion of the exam. Analysis of the renogram curve demonstrates a continually increasing curve, with delayed time to peak activity of 42.7 minutes. RIGHT kidney: Delayed and concentration of tracer by RIGHT kidney. The upper and mid portions of the RIGHT kidney demonstrate no renal function, corresponding to marked hydronephrosis and tumor invasion on CT. No significant excretion of tracer into a dilated collecting system is seen over the course of the exam. No ureteral activity identified. Significant retained tracer at the inferior pole the RIGHT kidney at the conclusion of the study. Analysis of the renogram curve demonstrates a continually climbing curved peaking at the conclusion of the exam at 45.7 minutes. Differential renal function: LEFT: 82% RIGHT: 18% IMPRESSION: Absent function at the upper and mid RIGHT kidney corresponding to hydronephrosis and tumor invasion on CT. Markedly  impaired BILATERAL renal function with significantly delayed uptake, concentration and excretion of tracer, evidence by continually increasing renogram curves bilaterally. Asymmetric renal function, markedly greater on LEFT than RIGHT. Electronically Signed   By: Lavonia Dana M.D.   On: 12/14/2019 08:41   US RENAL  Result Date: 12/11/2019 CLINICAL DATA:  Retroperitoneal mass on CT, increasing creatinine EXAM: RENAL / URINARY TRACT ULTRASOUND COMPLETE COMPARISON:  CT abdomen and pelvis 11/18/2019 FINDINGS: Right Kidney: Renal measurements: 11.9 x 7.9 x 7.9 cm = volume: 788 mL. Cortical thinning. Normal cortical echogenicity. Mild-to-moderate RIGHT hydronephrosis. Cystic lesion at upper pole 7.2 x 5.9 x 5.7 cm, simple features. Additional peripelvic/mid RIGHT renal cyst 5.7 x 4.3 x 4.0 cm. No solid masses or shadowing calculi. Left Kidney: Renal measurements: 12.1 x 6.3 x 6.9 cm = volume: 273 mL. Cortical thinning. Mild hydronephrosis. Minimally increased cortical echogenicity. Small mass at  upper pole 2.8 x 3.2 x 2.3 cm, by prior CT exam appears represent a splenic lobulation rather than a renal lesion. Small exophytic cyst at inferior pole 2.1 x 1.9 x 1.6 cm. No additional renal mass. Bladder: Stent in urinary bladder.  Bladder poorly distended. Other: Small LEFT pleural effusion. IMPRESSION: BILATERAL hydronephrosis, slightly greater on RIGHT. BILATERAL renal cysts larger on RIGHT. Small LEFT pleural effusion. Electronically Signed   By: Lavonia Dana M.D.   On: 12/11/2019 14:48   NM PET Image Initial (PI) Skull Base To Thigh  Result Date: 12/07/2019 CLINICAL DATA:  Initial treatment strategy for lymphoma. EXAM: NUCLEAR MEDICINE PET SKULL BASE TO THIGH TECHNIQUE: 11.8 mCi F-18 FDG was injected intravenously. Full-ring PET imaging was performed from the skull base to thigh after the radiotracer. CT data was obtained and used for attenuation correction and anatomic localization. Fasting blood glucose: 81 mg/dl COMPARISON:  CT chest abdomen and pelvis 11/18/2019 FINDINGS: Mediastinal blood pool activity: SUV max 2.6 Liver activity: SUV max 3.14. NECK: No hypermetabolic lymph nodes in the neck. Incidental CT findings: none CHEST: Paraspinal soft tissue mass within the posterior mediastinum measures 7.3 x 6.9 cm with SUV max of 3.59. Masslike architectural distortion within the lingula is identified, nonspecific measuring 3.9 x 2.2 cm with SUV max 2.48. There are bilateral pleural effusions, left greater than right. Increased tracer uptake within the left pleural effusion has an SUV max of 2.25. Cannot exclude malignant effusion. Incidental CT findings: Aortic atherosclerosis. Lad, left circumflex and RCA coronary artery calcifications identified. ABDOMEN/PELVIS: Within the left retroperitoneum extending into the paraspinal region and involving the left psoas muscle. This measures 4.1 x 4.7 cm and has SUV max of 5.0. Multiple small FDG avid right retroperitoneal and right upper quadrant lymph nodes are  identified. I suspect, there is asymmetric involvement of the right renal hilum with associated mild hydronephrosis. Difficulty to measure lymph nodes in this region individually. The SUV max within the right retroperitoneum is equal to 4.30. There is mild increased radiotracer uptake within both adrenal glands are identified which appear mildly enlarged. SUV max within the left adrenal gland is equal to 3.38. SUV max associated with the right adrenal gland is equal to 4.67. Incidental CT findings: Cholecystectomy. Aortic atherosclerosis. Mild right hydronephrosis. Status post left nephroureteral stent placement with mild residual hydronephrosis. Multiple large right kidney cysts identified. Small volume of free fluid noted within the abdomen and pelvis. SKELETON: Multifocal abnormal areas of increased radiotracer uptake are identified involving the axial and appendicular skeleton compatible with osseous involvement by lymphoma. Asymmetric increased uptake within the proximal left humerus  has an SUV max of 3.23. Asymmetric increased uptake localizing to the right side of the sternal manubrium has an SUV max of 3.67. Underlying lucent bone lesion is identified measuring 1.4 cm. FDG avid lesion involving bilateral ribs. Index rib lesion on the left has an SUV max of 4.7. Multiple FDG avid lesions are seen within the pelvis. Index lesion within the left side of sacrum has an SUV max of 4.39. Diffuse heterogeneous uptake throughout the spine noted with multiple FDG avid lesions. Index lesion involving the L4 vertebra has an SUV max of 4.77. Associated pathologic fracture involving the L4 vertebra is again noted. Incidental CT findings: none IMPRESSION: 1. Exam positive for FDG avid tumor within the paraspinal region of the posterior mediastinum. There is also an FDG avid tumor within the left retroperitoneum. Poorly defined FDG avid soft tissue infiltration and nodularity within the right retroperitoneum and right upper  quadrant of the abdomen is noted with suspected involvement of the right renal hilum. 2. 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 Many of these FDG avid lesions have corresponding lucent bone lesions on CT. Hypermetabolic tumor involving the L4 vertebra is noted with corresponding pathologic fracture. 3. Nonspecific masslike architectural distortion with mild increased uptake is noted within the lingula. Cannot exclude pulmonary the involvement by lymphoma. 4. Bilateral pleural effusions, left greater than right. Malignant pleural effusions not excluded. 5. Interval placement of left-sided nephroureteral stent with residual mild left hydronephrosis. 6.  Aortic Atherosclerosis (ICD10-I70.0). Electronically Signed   By: Kerby Moors M.D.   On: 12/07/2019 14:57   DG Chest Port 1 View  Result Date: 12/12/2019 CLINICAL DATA:  Pain status post fall EXAM: PORTABLE CHEST 1 VIEW COMPARISON:  08/01/2019 FINDINGS: There is a new moderate to large left-sided pleural effusion. There is a small right-sided pleural effusion. The heart size is relatively normal. Aortic calcifications are noted. There is no pneumothorax. There is no definite acute displaced fracture however evaluation of the left-sided ribs is limited by the large pleural effusion and osteopenia. The patient's known posterior mediastinal mass is better appreciated on prior CT. IMPRESSION: Moderate to large left-sided pleural effusion, new since prior study. Electronically Signed   By: Constance Holster M.D.   On: 12/12/2019 01:52   DG Foot 2 Views Left  Result Date: 12/12/2019 CLINICAL DATA:  Pain status post fall EXAM: LEFT FOOT - 2 VIEW COMPARISON:  None. FINDINGS: There is no evidence of fracture or dislocation. There is no evidence of arthropathy or other focal bone abnormality. There is mild soft tissue swelling about the dorsal aspect of the forefoot. Is a small plantar calcaneal spur. There are degenerative changes of the midfoot.  IMPRESSION: No acute displaced fracture. Electronically Signed   By: Constance Holster M.D.   On: 12/12/2019 01:46   CT BONE MARROW BIOPSY & ASPIRATION  Result Date: 12/15/2019 INDICATION: 84 year old with concern for myeloma.  Known retroperitoneal lesion. EXAM: CT GUIDED BONE MARROW ASPIRATES AND BIOPSY Physician: Stephan Minister. Anselm Pancoast, MD MEDICATIONS: None. ANESTHESIA/SEDATION: Fentanyl 25 mcg IV; Versed 0.5 mg IV Moderate Sedation Time:  12 minutes The patient was continuously monitored during the procedure by the interventional radiology nurse under my direct supervision. COMPLICATIONS: None immediate. PROCEDURE: The procedure was explained to the patient. The risks and benefits of the procedure were discussed and the patient's questions were addressed. Informed consent was obtained from the patient. The patient was placed prone on CT table. Images of the pelvis were obtained. The left side of back was  prepped and draped in sterile fashion. The skin and left posterior ilium were anesthetized with 1% lidocaine. 11 gauge bone needle was directed into the left ilium with CT guidance. Two aspirates and one core biopsy were obtained. Bandage placed over the puncture site. FINDINGS: Again noted is soft tissue fullness along the left lower periaortic region. Partial visualization of the right kidney with renal cysts. Small amount of fluid or edema in the presacral region. Bones are heterogeneous with subtle areas of lucency. Bone needle was directed into the posterior left ilium. Adequate bone marrow samples were obtained. IMPRESSION: CT guided bone marrow aspiration and core biopsy. Electronically Signed   By: Markus Daft M.D.   On: 12/15/2019 10:46   DG C-Arm 1-60 Min-No Report  Result Date: 11/19/2019 Fluoroscopy was utilized by the requesting physician.  No radiographic interpretation.   CT Renal Stone Study  Result Date: 12/12/2019 CLINICAL DATA:  Lymphoma.  Hydronephrosis. EXAM: CT ABDOMEN AND PELVIS WITHOUT  CONTRAST TECHNIQUE: Multidetector CT imaging of the abdomen and pelvis was performed following the standard protocol without IV contrast. COMPARISON:  Ultrasound 12/11/2019. PET scan 12/07/2019. CT 11/17/2019. FINDINGS: Lower chest: Enlarging effusions left more than right with atelectasis in both lower lungs. Lymphoma mass in the retrocrural space, increased in volume compared to the study of January 12th. Thickness of tumor in this region up to 3 cm. Previously this measured about 2 cm. Majority encasement of the aorta. Hepatobiliary: No visible liver parenchymal lesion. Previous cholecystectomy. Pancreas: Negative Spleen: Negative Adrenals/Urinary Tract: Left adrenal gland appears normal. Right adrenal gland difficult to separate from retroperitoneal mass. Left kidney shows a double-J ureteral stent in place. Mild fullness of the left renal collecting system. Renal stent an ureter passes along side a left-sided retroperitoneal mass which has enlarged since the previous study, measuring approximately 4 x 7 cm compared with 2 x 4 cm previously. Hydronephrosis of the right kidney again seen, with multiple cysts as well. Tumor infiltrating the region of the hilum has increased in volume. Bladder appears unremarkable, except for the left double-J ureteral stent entering the bladder. Stomach/Bowel: No evidence of bowel obstruction. Mild edema adjacent to the proximal sigmoid colon could possibly represent low level diverticulitis. This is more pronounced than was seen previously. Vascular/Lymphatic: See above regarding extensive retroperitoneal lymphoma tissue. Reproductive: Previous hysterectomy. Other: No free fluid or air. Musculoskeletal: Subacute fractures of the spine as seen previously, not grossly progressive. No lytic lesion or pathologic fracture of the pelvic bones. IMPRESSION: Pleural effusions larger on the left than the right with dependent atelectasis. Increase in tumor volume in the retrocrural region  and the left retroperitoneum at the abdominopelvic junction region. See above. Increase in amount of tumor in the right renal hilar region. Double-J ureteral stent in place on the left. Mild fullness of the left renal collecting system. Hydronephrosis of the right kidney, likely obstructed by the tumor in the renal hilar region. Increased edema in the fat in the proximal sigmoid colon region suggests mild diverticulitis. Multiple lumbar compression fractures at not visibly progressed. Electronically Signed   By: Nelson Chimes M.D.   On: 12/12/2019 05:24   IR NEPHROSTOMY PLACEMENT LEFT  Result Date: 12/13/2019 INDICATION: 85 year old female with a history of left-sided hydronephrosis, referred for nephrostomy placement EXAM: IR NEPHROSTOMY PLACEMENT LEFT COMPARISON:  None. MEDICATIONS: None ANESTHESIA/SEDATION: Fentanyl 100 mcg IV; Versed 2.0 mg IV Moderate Sedation Time:  15 minutes The patient was continuously monitored during the procedure by the interventional radiology nurse under my  direct supervision. CONTRAST:  44m OMNIPAQUE IOHEXOL 300 MG/ML SOLN - administered into the collecting system(s) FLUOROSCOPY TIME:  Fluoroscopy Time: 3 minutes 12 seconds (22 mGy). COMPLICATIONS: None PROCEDURE: Informed written consent was obtained from the patient after a thorough discussion of the procedural risks, benefits and alternatives. All questions were addressed. Maximal Sterile Barrier Technique was utilized including caps, mask, sterile gowns, sterile gloves, sterile drape, hand hygiene and skin antiseptic. A timeout was performed prior to the initiation of the procedure. Patient positioned prone position on the fluoroscopy table. Ultrasound survey of the left flank was performed with images stored and sent to PACs. The patient was then prepped and draped in the usual sterile fashion. 1% lidocaine was used to anesthetize the skin and subcutaneous tissues for local anesthesia. A Chiba needle was then used to access a  posterior inferior calyx with ultrasound guidance. With spontaneous urine returned through the needle, passage of an 018 micro wire into the collecting system was performed under fluoroscopy. A small incision was made with an 11 blade scalpel, and the needle was removed from the wire. An Accustick system was then advanced over the wire into the collecting system under fluoroscopy. The metal stiffener and inner dilator were removed, and then a sample of fluid was aspirated through the 4 French outer sheath. Bentson wire was passed into the collecting system and the sheath removed. Ten French dilation of the soft tissues was performed. The dilator encountered a site of resistance and a kink in the wire developed. We then removed the dilator and pass the Accustick 4 French sheath over the wire into the collecting system. The wire was then removed entirely, and a new 035 Amplatz wire was placed. Using modified Seldinger technique, a 10 French pigtail catheter drain was placed over the Amplatz wire. Wire and inner stiffener removed, and the pigtail was formed in the collecting system. Small amount of contrast confirmed position of the catheter. Patient tolerated the procedure well and remained hemodynamically stable throughout. No complications were encountered and no significant blood loss encountered IMPRESSION: Status post placement of left percutaneous nephrostomy. Signed, JDulcy Fanny WDellia Nims RPVI [facility] Center[facility] Electronically Signed   By: JCorrie MckusickD.O.   On: 12/13/2019 15:24   VAS [place]OWER EXTREMITY VENOUS (DVT)  Result Date: 12/13/2019  Lower Venous DVT Study Indications: Edema.  Limitations: Body habitus and edema. Comparison Study: Prior Right Lower extremity venous duplex from 06/09/2018 is                   available for comparison Performing Technologist: CSharion DoveRVS  Examination Guidelines: A complete evaluation includes B-mode imaging,  spectral Doppler, color Doppler, and power Doppler as needed of all accessible portions of each vessel. Bilateral testing is considered an integral part of a complete examination. Limited examinations for reoccurring indications may be performed as noted. The reflux portion of the exam is performed with the patient in reverse Trendelenburg.  +---------+---------------+---------+-----------+----------+-------------------+ RIGHT    CompressibilityPhasicitySpontaneityPropertiesThrombus Aging      +---------+---------------+---------+-----------+----------+-------------------+ CFV      Full           Yes      Yes                                      +---------+---------------+---------+-----------+----------+-------------------+ SFJ      Full                                                             +---------+---------------+---------+-----------+----------+-------------------+  FV Prox  Full                                                             +---------+---------------+---------+-----------+----------+-------------------+ FV Mid   Full                                                             +---------+---------------+---------+-----------+----------+-------------------+ FV Distal               Yes      Yes                  patent by Color and                                                       Doppler             +---------+---------------+---------+-----------+----------+-------------------+ PFV      Full                                                             +---------+---------------+---------+-----------+----------+-------------------+ POP                     Yes      Yes                  patent by Color and                                                       Doppler             +---------+---------------+---------+-----------+----------+-------------------+ PTV      Full                                                              +---------+---------------+---------+-----------+----------+-------------------+ PERO     Full                                                             +---------+---------------+---------+-----------+----------+-------------------+   +---------+---------------+---------+-----------+----------+--------------+ LEFT     CompressibilityPhasicitySpontaneityPropertiesThrombus Aging +---------+---------------+---------+-----------+----------+--------------+ CFV      Full           Yes      Yes                                 +---------+---------------+---------+-----------+----------+--------------+  SFJ      Full                                                        +---------+---------------+---------+-----------+----------+--------------+ FV Prox  Full                                                        +---------+---------------+---------+-----------+----------+--------------+ FV Mid   Full                                                        +---------+---------------+---------+-----------+----------+--------------+ FV DistalFull                                                        +---------+---------------+---------+-----------+----------+--------------+ PFV      Full                                                        +---------+---------------+---------+-----------+----------+--------------+ POP      Full           Yes      Yes                                 +---------+---------------+---------+-----------+----------+--------------+ PTV      Full                                                        +---------+---------------+---------+-----------+----------+--------------+ PERO     Full                                                        +---------+---------------+---------+-----------+----------+--------------+     Summary: RIGHT: - Findings appear essentially unchanged compared to previous examination.  - There is no evidence of deep vein thrombosis in the lower extremity. However, portions of this examination were limited- see technologist comments above.  LEFT: - There is no evidence of deep vein thrombosis in the lower extremity.  *See table(s) above for measurements and observations. Electronically signed by Ruta Hinds MD on 12/13/2019 at 11:40:24 AM.    Final    US THORACENTESIS ASP PLEURAL SPACE W/IMG GUIDE  Result Date: 12/12/2019 INDICATION: Patient with history of retroperitoneal malignant neoplasm was bilateral pleural effusion presents for therapeutic and diagnostic thoracentesis. EXAM: ULTRASOUND GUIDED  THERAPEUTIC AND DIAGNOSTIC THORACENTESIS MEDICATIONS: Lidocaine 1% 10 mL COMPLICATIONS: None immediate. PROCEDURE: An ultrasound guided thoracentesis was thoroughly discussed with the patient and questions answered. The benefits, risks, alternatives and complications were also discussed. The patient understands and wishes to proceed with the procedure. Written consent was obtained. Ultrasound was performed to localize and mark an adequate pocket of fluid in the left-sided chest. The area was then prepped and draped in the normal sterile fashion. 1% Lidocaine was used for local anesthesia. Under ultrasound guidance a catheter was introduced. Thoracentesis was performed. The catheter was removed and a dressing applied. FINDINGS: A total of approximately 650 mL of amber colored fluid was removed. Samples were sent to the laboratory as requested by the clinical team. IMPRESSION: Successful ultrasound guided therapeutic and diagnostic left-sided thoracentesis yielding 650 mL of pleural fluid. Read by Rushie Nyhan NP Electronically Signed   By: Corrie Mckusick D.O.   On: 12/12/2019 10:39    ASSESSMENT: Kappa chain myeloma.  PLAN:    1.  Kappa chain myeloma: Patient noted to have a significantly elevated kappa light chain of greater than 5000 along with IgA immunoglobulin of greater than 1700.   M spike is only mildly elevated at 1.7.  Retroperitoneal biopsy from last week as well as cytology from pleural fluid several days ago both containing plasma cells consistent with a plasma cell neoplasm.  Bone marrow biopsy results from this morning are pending at time of dictation.  Patient transferred from [facility] to receive chemotherapy with weekly Velcade, dexamethasone, and Cytoxan.  Chemotherapy plan has been ordered and pharmacy/nursing aware.  Plan is to have treatment either Wednesday or Thursday of this week.  Have also consulted palliative care for goals of care. 2.  Acute renal failure: Creatinine is elevated, but has stabilized since insertion of nephrostomy tube.  Suspect her rapid decrease in kidney function is multifactorial including kappa light chain disease.  Continue to monitor daily metabolic panel.  Chemotherapy as above. 3.  Pain: Patient was given a prescription for tramadol. 4.  Inpatient stay: Patient did not have accepting physician with [facility] hospitalist upon arrival, therefore have put in basic admission orders and consulted Hospitalist group to take over day-to-day care.  Estimated discharge is Friday or Saturday of this week.  Patient is DNR/DNI.  Order has been placed.  Will follow.   Lloyd Huger, MD   12/15/2019 4:33 PM

## 2019-12-15 NOTE — Discharge Summary (Signed)
Physician Discharge Summary  Regina Harris EXB:[id number] DOB: 1936/06/04 DOA: 12/11/2019  PCP: Abner Greenspan, MD  Admit date: 12/11/2019 Discharge date: 12/15/2019  Admitted From: Home Disposition: [facility]  Recommendations for Outpatient Follow-up:  Going to inpatient bed at [facility] to start chemotherapy, further recommendations will be after subsequent hospitalization.   Discharge Condition: Stable CODE STATUS: DNR, paper chart available Diet recommendation: Regular diet  Discharge summary:  84 year old female with history of hypertension, hyperlipidemia, recent diagnosis of retroperitoneal neoplasm about a month ago, bilateral hydronephrosis status post cystoscopy with left ureteral stent placement on 1/14 followed by urology and oncology at [facility], recently treated for UTI.  Final pathology was pending.  She has been following up with oncology and plans were made for further investigations and treatment, she was found to have declining performance status and worsening creatinine and sent to ER.  Acute renal failure: Normal renal function 4 months ago. suspected due to combination of myeloma kidney, aggravated by obstructive uropathy. Status post left percutaneous nephrostomy, functioning well Renal functions remained stable.  Lasix on hold Urine output is adequate Followed by nephrology, after discussion with oncology, decided to start early on treatment for multiple myeloma.  Left-sided pleural effusion: 850 mL transudate removed.  Negative cultures.  Cytology pending.  Acute UTI present on admission: Not obviously symptomatic.  She had 40,000 colonies of enterococci faecalis.  Will treat with amoxicillin for 7 days.  Retroperitoneal malignancy, recently worsening tumor burden likely multiple myeloma: Initial pathology was inconclusive, serology consistent with multiple myeloma. Patient underwent bone marrow biopsy today with IR at [facility]. She is medically  stable, she will be transferred back to [facility] hospital today after bone marrow biopsy as per her oncologist, Dr. Delight Hoh to start planning about inpatient myeloma treatment.  Advance care planning: Patient is currently stable.  She does have chronic debility.  She is DNR.  At this moment, they want to pursue treatment options.  Also seen by palliative.  If she is not to improve appropriately, patient and family open for hospice discussions. Patient's granddaughter, Ms. [name] is point of contact and is aware about the care plan.  Discharge Diagnoses:  Principal Problem:   Bilateral hydronephrosis Active Problems:   UTI (urinary tract infection)   AKI (acute kidney injury) ([place])   Retroperitoneal mass   IgG multiple myeloma (HCC)   Laceration of left foot   Palliative care encounter    Discharge Instructions  Discharge Instructions    Diet - low sodium heart healthy   Complete by: As directed    Increase activity slowly   Complete by: As directed      Allergies as of 12/15/2019      Reactions   Benadryl [diphenhydramine] Other (See Comments)   Hallucinations; family request to not give this medication.      Medication List    STOP taking these medications   furosemide 20 MG tablet Commonly known as: LASIX   potassium chloride SA 20 MEQ tablet Commonly known as: KLOR-CON   zinc sulfate 220 (50 Zn) MG capsule     TAKE these medications   acetaminophen 500 MG tablet Commonly known as: TYLENOL Take 2 tablets (1,000 mg total) by mouth 3 (three) times daily.   amoxicillin 250 MG capsule Commonly known as: AMOXIL Take 1 capsule (250 mg total) by mouth every 12 (twelve) hours for 6 days.   aspirin EC 81 MG tablet Take 81 mg by mouth daily.   atorvastatin 10  MG tablet Commonly known as: LIPITOR TAKE 1 TABLET BY MOUTH ONCE DAILY   Calcium 1000 + D 1000-800 MG-UNIT Tabs Generic drug: Calcium Carb-Cholecalciferol Take 1 tablet by mouth 2 (two)  times daily.   cyanocobalamin 1000 MCG tablet Take 1,000 mcg by mouth daily.   donepezil 5 MG tablet Commonly known as: ARICEPT TAKE 1 TABLET BY MOUTH AT BEDTIME   gabapentin 400 MG capsule Commonly known as: NEURONTIN TAKE 1 CAPSULE BY MOUTH TWICE DAILY   levothyroxine 75 MCG tablet Commonly known as: SYNTHROID Take 75 mcg by mouth daily with breakfast.   megestrol 40 MG tablet Commonly known as: MEGACE Take 1 tablet (40 mg total) by mouth daily.   omeprazole 20 MG capsule Commonly known as: PRILOSEC TAKE 1 CAPSULE BY MOUTH TWICE DAILY   sodium bicarbonate 650 MG tablet Take 2 tablets (1,300 mg total) by mouth 2 (two) times daily.   traMADol 50 MG tablet Commonly known as: ULTRAM Take 1 tablet (50 mg total) by mouth every 6 (six) hours as needed. What changed: reasons to take this       Allergies  Allergen Reactions  . Benadryl [Diphenhydramine] Other (See Comments)    Hallucinations; family request to not give this medication.    Consultations:  Oncology  Nephrology  Palliative medicine   Procedures/Studies: CT ABDOMEN PELVIS WO CONTRAST  Result Date: 11/17/2019 CLINICAL DATA:  84 year old female with left-sided abdominal pain. Concern for acute diverticulitis. EXAM: CT ABDOMEN AND PELVIS WITHOUT CONTRAST TECHNIQUE: Multidetector CT imaging of the abdomen and pelvis was performed following the standard protocol without IV contrast. COMPARISON:  CT abdomen pelvis dated 06/13/2006. FINDINGS: Evaluation of this exam is limited in the absence of intravenous contrast. Lower chest: Trace left pleural effusion. There is thickened appearance of the posteromedial lower lobe pleural surfaces. Partially visualized infiltrative mass encasing the distal descending thoracic aorta and abutting the lower thoracic spine at T10-T11. This may represent an infiltrative neoplasm, lymphoma, or fibrosis versus sequela osteomyelitis/discitis. This is new since the CT of 07/17/2010.  Further evaluation with MRI without and with contrast is recommended. Bibasilar subpleural linear and streaky densities may represent atelectasis/scarring. Infiltrate is less likely. Clinical correlation is recommended. There is no intra-abdominal free air or free fluid. Hepatobiliary: The liver is unremarkable. No intrahepatic biliary ductal dilatation. Cholecystectomy. No retained calcified stone noted in the central CBD. Pancreas: There is mild haziness of the distal pancreas which may represent acute pancreatitis. Correlation with pancreatic enzymes recommended. No drainable fluid collection/abscess or pseudocyst Spleen: Normal in size without focal abnormality. Adrenals/Urinary Tract: There is a 2 cm left adrenal adenoma. The right adrenal gland is unremarkable. There is mild left hydronephrosis. No calcified stone identified. There is haziness and stranding of the proximal left periureteric fat which may represent an infection/inflammatory process or fibrosis and possible degree of ureteral stricture. There is a 2 cm left renal inferior pole hypodense lesion which is not characterized but demonstrates fluid attenuation most consistent with a cyst. There is no hydronephrosis or nephrolithiasis on the right. Multiple right renal cysts measure up to approximately 7 cm. The right ureter and urinary bladder appear unremarkable. Stomach/Bowel: There is sigmoid diverticulosis with muscular hypertrophy. Mild perisigmoid stranding and haziness may represent mild acute diverticulitis. Clinical correlation is recommended. There is diffuse thickened appearance of the colon which may be related to underdistention. Diffuse submucosal fat deposit along the colonic wall likely sequela of chronic inflammatory process. There is no bowel obstruction. Appendectomy. Vascular/Lymphatic: Advanced aortoiliac atherosclerotic disease.  There is stranding of the periaortic fat. There is a 3.8 x 2.1 cm left para-aortic/paraspinal soft  tissue lesion which is not characterized but may represent enlarged lymph node (series 2 image 38). However, this soft tissue appears to abut the left L4 vertebra and therefore a neoplasm or an infectious process is not excluded. No portal venous gas. The IVC is unremarkable. Reproductive: Hysterectomy. Other: None Musculoskeletal: Osteopenia with extensive multilevel degenerative changes, disc desiccation and vacuum phenomena. Old-appearing multilevel compression fractures most prominent involving L3 and L4. No definite acute fracture. No retropulsed fragment. IMPRESSION: 1. Sigmoid diverticulosis with findings concerning for mild diverticulitis. No diverticular abscess or perforation. 2. No bowel obstruction. 3. Mild left hydronephrosis, possibly secondary to a degree of stricture or decreased peristalsis of the left ureter due to adjacent inflammatory process or fibrosis. No stone. 4. Mild stranding of the distal pancreas. Correlation with pancreatic enzymes recommended to evaluate for acute pancreatitis. No fluid collection. 5. Infiltrative soft tissue mass along the lower thoracic spine at T10-T11 with encasement of the aorta. This may represent an inflammatory/infectious process (osteomyelitis/discitis) or an infiltrative neoplasm. Further evaluation with MRI without and with contrast is recommended. 6. Thickened appearance of the inferior medial lower lobe pleural surfaces which may be secondary to infiltration of paraspinal/periaortic mass or sequela of chronic inflammation. 7. Left para-aortic soft tissue density to the left of L4 may represent adenopathy or a neoplasm. Electronically Signed   By: Anner Crete M.D.   On: 11/17/2019 18:09   DG Chest 1 View  Result Date: 12/12/2019 CLINICAL DATA:  Status post LEFT thoracentesis. EXAM: CHEST  1 VIEW COMPARISON:  12/12/2019 FINDINGS: LEFT pleural effusion has decreased, now trace. LEFT basilar atelectasis has improved. A trace RIGHT pleural effusion may  be present. There is no evidence of pneumothorax. IMPRESSION: Decreased LEFT pleural effusion, now trace, with improved LEFT basilar atelectasis. No evidence of pneumothorax. Electronically Signed   By: Margarette Canada M.D.   On: 12/12/2019 10:49   DG Knee 2 Views Left  Result Date: 12/12/2019 CLINICAL DATA:  Knee pain EXAM: LEFT KNEE - 1-2 VIEW COMPARISON:  None. FINDINGS: No fracture or dislocation. Tricompartmental osteoarthritis is noted with joint space loss and subchondral sclerosis. No large knee joint effusion. Patellar enthesopathy seen. Vascular calcifications are noted. IMPRESSION: No acute osseous abnormality. Electronically Signed   By: Prudencio Pair M.D.   On: 12/12/2019 03:35   DG Abd 1 View  Result Date: 12/11/2019 CLINICAL DATA:  Ureteral stent. Lower abdominal pain. EXAM: ABDOMEN - 1 VIEW COMPARISON:  November 17, 2019. FINDINGS: The bowel gas pattern is normal. Status post cholecystectomy. Left ureteral stent is noted in grossly good position. Phleboliths are noted in the pelvis. No nephrolithiasis is noted. IMPRESSION: Left ureteral stent is noted in grossly good position. No evidence of bowel obstruction or ileus. Electronically Signed   By: Marijo Conception M.D.   On: 12/11/2019 16:30   CT Head Wo Contrast  Result Date: 12/12/2019 CLINICAL DATA:  Fall weakness EXAM: CT HEAD WITHOUT CONTRAST TECHNIQUE: Contiguous axial images were obtained from the base of the skull through the vertex without intravenous contrast. COMPARISON:  December 07, 2019 brain MRI FINDINGS: Brain: No evidence of acute territorial infarction, hemorrhage, hydrocephalus,extra-axial collection or mass lesion/mass effect. There is dilatation the ventricles and sulci consistent with age-related atrophy. Low-attenuation changes in the deep white matter consistent with small vessel ischemia. Vascular: No hyperdense vessel or unexpected calcification. Skull: The skull is intact. No fracture or focal  lesion identified.  Sinuses/Orbits: The visualized paranasal sinuses and mastoid air cells are clear. The orbits and globes intact. Other: Small laceration seen over the posterior left parietal skull. IMPRESSION: No acute intracranial abnormality. Findings consistent with age related atrophy and chronic small vessel ischemia Electronically Signed   By: Prudencio Pair M.D.   On: 12/12/2019 00:45   CT CHEST WO CONTRAST  Result Date: 11/18/2019 CLINICAL DATA:  Adenopathy EXAM: CT CHEST WITHOUT CONTRAST TECHNIQUE: Multidetector CT imaging of the chest was performed following the standard protocol without IV contrast. COMPARISON:  Chest CT 07/17/2010 FINDINGS: Cardiovascular: Normal heart size. No pericardial effusion. Aortic and coronary atherosclerosis. Mediastinum/Nodes: Infiltrative posterior mediastinal mass partially encasing the descending aorta with indistinguishable plane between the mass and the posterior wall of the aorta separate from areas of intimal calcification. The infiltrative abnormality extends approximately 9 cm craniocaudal and 5.6 cm in diameter. The hila and other mediastinal spaces are negative for adenopathy. Lungs/Pleura: Small pleural effusions on both sides. There is subpleural extension of the mass with no visible nodularity along the pleural surface, although limited without contrast. Dependent atelectasis. No worrisome pulmonary nodules. Upper Abdomen: Partial coverage shows left hydronephrosis likely due to the retroperitoneal mass medial to the left psoas on prior CT. Right renal cystic change and atherosclerotic calcification. Cholecystectomy. Musculoskeletal: The posterior mediastinal mass is in close proximity to the spine but does not cause visible erosion or infiltrative lucency. No gross spinal canal mass. No evidence of hematogenous osseous metastases. IMPRESSION: 1. Approximately 9 x 6 cm infiltrative posterior mediastinal mass intimately associated with the descending aorta and thoracic spine.  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 2. Known left hydronephrosis related to infiltrative mass in the retroperitoneum. 3. Small pleural effusions with lower lobe atelectasis. Electronically Signed   By: Monte Fantasia M.D.   On: 11/18/2019 11:27   CT GUIDED NEEDLE PLACEMENT  Result Date: 11/20/2019 INDICATION: No known primary, now with retroperitoneal mass worrisome for lymphoma. Please perform CT-guided biopsy for tissue diagnostic purposes. EXAM: CT GUIDANCE NEEDLE PLACEMENT COMPARISON:  CT abdomen and pelvis-09/16/2020 MEDICATIONS: None. ANESTHESIA/SEDATION: Fentanyl 50 mcg IV; Versed 1 mg IV Sedation time: 16 minutes; The patient was continuously monitored during the procedure by the interventional radiology nurse under my direct supervision. CONTRAST:  None. COMPLICATIONS: None immediate. PROCEDURE: Informed consent was obtained from the patient following an explanation of the procedure, risks, benefits and alternatives. A time out was performed prior to the initiation of the procedure. The patient was positioned prone on the CT table and a limited CT was performed for procedural planning demonstrating unchanged size and appearance the left-sided retroperitoneal mass, presumed nodal conglomeration with dominant component measuring approximately 4.5 x 2.6 cm (image 21, series 3). The procedure was planned. The operative site was prepped and draped in the usual sterile fashion. Appropriate trajectory was confirmed with a 22 gauge spinal needle after the adjacent tissues were anesthetized with 1% Lidocaine with epinephrine. Under intermittent CT guidance, a 17 gauge coaxial needle was advanced into the peripheral aspect of the mass. Appropriate positioning was confirmed and 6 core needle biopsy samples were obtained with an 18 gauge core needle biopsy device. The co-axial needle was removed following the administration of a Gel-Foam slurry and superficial hemostasis was achieved with manual compression. A limited  postprocedural CT was negative for hemorrhage or additional complication. A dressing was placed. The patient tolerated the procedure well without immediate postprocedural complication. IMPRESSION: Technically successful CT guided core needle biopsy of left-sided retroperitoneal mass, presumed nodal conglomeration.  Electronically Signed   By: Sandi Mariscal M.D.   On: 11/20/2019 09:43   MR Brain W Wo Contrast  Result Date: 12/07/2019 CLINICAL DATA:  Lymphoma. EXAM: MRI HEAD WITHOUT AND WITH CONTRAST TECHNIQUE: Multiplanar, multiecho pulse sequences of the brain and surrounding structures were obtained without and with intravenous contrast. CONTRAST:  53m GADAVIST GADOBUTROL 1 MMOL/ML IV SOLN COMPARISON:  Head CT 07/29/2019 FINDINGS: The study is moderately motion degraded. Brain: There is no evidence of acute infarct, intracranial hemorrhage, mass, midline shift, or extra-axial fluid collection. There is mild to moderate cerebral atrophy. T2 hyperintensities in the cerebral white matter bilaterally are nonspecific but compatible with mild chronic small vessel ischemic disease. No enhancing lesions are identified, however note that small lesions could be easily obscured by the degree of motion artifact. Vascular: Major intracranial vascular flow voids are preserved. Skull and upper cervical spine: No gross skull lesion identified. Sinuses/Orbits: Bilateral cataract extraction. Mild right sphenoid sinus mucosal thickening. Small left mastoid effusion. Other: None. IMPRESSION: 1. Motion degraded examination without evidence of intracranial metastases. 2. Mild chronic small vessel ischemic disease. Electronically Signed   By: ALogan BoresM.D.   On: 12/07/2019 15:52   NM Renal Imaging Flow W/O Pharm  Result Date: 12/14/2019 CLINICAL DATA:  Hydronephrosis, abnormal CT, LEFT renal collecting system dilatation with prior stenting, RIGHT hydronephrosis with tumor invasion of RIGHT renal hilum, history lymphoma EXAM:  NUCLEAR MEDICINE RENAL SCAN TECHNIQUE: Radionuclide angiographic and sequential renal images were obtained after intravenous injection of radiopharmaceutical. Lasix not administered due to new acute renal failure. RADIOPHARMACEUTICALS:  5.38 mCi Technetium-960mAG3 IV COMPARISON:  CT abdomen and pelvis 12/12/2019 FINDINGS: Blood flow: Poor tracer bolus. Probable delayed blood flow to both kidneys though this is suboptimally assessed. LEFT kidney: Delayed uptake, concentration and excretion of tracer by LEFT kidney. Central photopenia early in exam corresponds to mildly dilated collecting system. Delayed excretion of into the dilated collecting system. Significant retained tracer within the dilated collecting system and the renal cortex at the conclusion of the exam. Analysis of the renogram curve demonstrates a continually increasing curve, with delayed time to peak activity of 42.7 minutes. RIGHT kidney: Delayed and concentration of tracer by RIGHT kidney. The upper and mid portions of the RIGHT kidney demonstrate no renal function, corresponding to marked hydronephrosis and tumor invasion on CT. No significant excretion of tracer into a dilated collecting system is seen over the course of the exam. No ureteral activity identified. Significant retained tracer at the inferior pole the RIGHT kidney at the conclusion of the study. Analysis of the renogram curve demonstrates a continually climbing curved peaking at the conclusion of the exam at 45.7 minutes. Differential renal function: LEFT: 82% RIGHT: 18% IMPRESSION: Absent function at the upper and mid RIGHT kidney corresponding to hydronephrosis and tumor invasion on CT. Markedly impaired BILATERAL renal function with significantly delayed uptake, concentration and excretion of tracer, evidence by continually increasing renogram curves bilaterally. Asymmetric renal function, markedly greater on LEFT than RIGHT. Electronically Signed   By: MaLavonia Dana.D.   On:  12/14/2019 08:41   [place]  Result Date: 12/11/2019 CLINICAL DATA:  Retroperitoneal mass on CT, increasing creatinine EXAM: RENAL / URINARY TRACT ULTRASOUND COMPLETE COMPARISON:  CT abdomen and pelvis 11/18/2019 FINDINGS: Right Kidney: Renal measurements: 11.9 x 7.9 x 7.9 cm = volume: 788 mL. Cortical thinning. Normal cortical echogenicity. Mild-to-moderate RIGHT hydronephrosis. Cystic lesion at upper pole 7.2 x 5.9 x 5.7 cm, simple features. Additional peripelvic/mid RIGHT renal  cyst 5.7 x 4.3 x 4.0 cm. No solid masses or shadowing calculi. Left Kidney: Renal measurements: 12.1 x 6.3 x 6.9 cm = volume: 273 mL. Cortical thinning. Mild hydronephrosis. Minimally increased cortical echogenicity. Small mass at upper pole 2.8 x 3.2 x 2.3 cm, by prior CT exam appears represent a splenic lobulation rather than a renal lesion. Small exophytic cyst at inferior pole 2.1 x 1.9 x 1.6 cm. No additional renal mass. Bladder: Stent in urinary bladder.  Bladder poorly distended. Other: Small LEFT pleural effusion. IMPRESSION: BILATERAL hydronephrosis, slightly greater on RIGHT. BILATERAL renal cysts larger on RIGHT. Small LEFT pleural effusion. Electronically Signed   By: Lavonia Dana M.D.   On: 12/11/2019 14:48   NM PET Image Initial (PI) Skull Base To Thigh  Result Date: 12/07/2019 CLINICAL DATA:  Initial treatment strategy for lymphoma. EXAM: NUCLEAR MEDICINE PET SKULL BASE TO THIGH TECHNIQUE: 11.8 mCi F-18 FDG was injected intravenously. Full-ring PET imaging was performed from the skull base to thigh after the radiotracer. CT data was obtained and used for attenuation correction and anatomic localization. Fasting blood glucose: 81 mg/dl COMPARISON:  CT chest abdomen and pelvis 11/18/2019 FINDINGS: Mediastinal blood pool activity: SUV max 2.6 Liver activity: SUV max 3.14. NECK: No hypermetabolic lymph nodes in the neck. Incidental CT findings: none CHEST: Paraspinal soft tissue mass within the posterior mediastinum  measures 7.3 x 6.9 cm with SUV max of 3.59. Masslike architectural distortion within the lingula is identified, nonspecific measuring 3.9 x 2.2 cm with SUV max 2.48. There are bilateral pleural effusions, left greater than right. Increased tracer uptake within the left pleural effusion has an SUV max of 2.25. Cannot exclude malignant effusion. Incidental CT findings: Aortic atherosclerosis. Lad, left circumflex and RCA coronary artery calcifications identified. ABDOMEN/PELVIS: Within the left retroperitoneum extending into the paraspinal region and involving the left psoas muscle. This measures 4.1 x 4.7 cm and has SUV max of 5.0. Multiple small FDG avid right retroperitoneal and right upper quadrant lymph nodes are identified. I suspect, there is asymmetric involvement of the right renal hilum with associated mild hydronephrosis. Difficulty to measure lymph nodes in this region individually. The SUV max within the right retroperitoneum is equal to 4.30. There is mild increased radiotracer uptake within both adrenal glands are identified which appear mildly enlarged. SUV max within the left adrenal gland is equal to 3.38. SUV max associated with the right adrenal gland is equal to 4.67. Incidental CT findings: Cholecystectomy. Aortic atherosclerosis. Mild right hydronephrosis. Status post left nephroureteral stent placement with mild residual hydronephrosis. Multiple large right kidney cysts identified. Small volume of free fluid noted within the abdomen and pelvis. SKELETON: Multifocal abnormal areas of increased radiotracer uptake are identified involving the axial and appendicular skeleton compatible with osseous involvement by lymphoma. Asymmetric increased uptake within the proximal left humerus has an SUV max of 3.23. Asymmetric increased uptake localizing to the right side of the sternal manubrium has an SUV max of 3.67. Underlying lucent bone lesion is identified measuring 1.4 cm. FDG avid lesion involving  bilateral ribs. Index rib lesion on the left has an SUV max of 4.7. Multiple FDG avid lesions are seen within the pelvis. Index lesion within the left side of sacrum has an SUV max of 4.39. Diffuse heterogeneous uptake throughout the spine noted with multiple FDG avid lesions. Index lesion involving the L4 vertebra has an SUV max of 4.77. Associated pathologic fracture involving the L4 vertebra is again noted. Incidental CT findings: none IMPRESSION: 1.  Exam positive for FDG avid tumor within the paraspinal region of the posterior mediastinum. There is also an FDG avid tumor within the left retroperitoneum. Poorly defined FDG avid soft tissue infiltration and nodularity within the right retroperitoneum and right upper quadrant of the abdomen is noted with suspected involvement of the right renal hilum. 2. 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 Many of these FDG avid lesions have corresponding lucent bone lesions on CT. Hypermetabolic tumor involving the L4 vertebra is noted with corresponding pathologic fracture. 3. Nonspecific masslike architectural distortion with mild increased uptake is noted within the lingula. Cannot exclude pulmonary the involvement by lymphoma. 4. Bilateral pleural effusions, left greater than right. Malignant pleural effusions not excluded. 5. Interval placement of left-sided nephroureteral stent with residual mild left hydronephrosis. 6.  Aortic Atherosclerosis (ICD10-I70.0). Electronically Signed   By: Kerby Moors M.D.   On: 12/07/2019 14:57   DG Chest Port 1 View  Result Date: 12/12/2019 CLINICAL DATA:  Pain status post fall EXAM: PORTABLE CHEST 1 VIEW COMPARISON:  08/01/2019 FINDINGS: There is a new moderate to large left-sided pleural effusion. There is a small right-sided pleural effusion. The heart size is relatively normal. Aortic calcifications are noted. There is no pneumothorax. There is no definite acute displaced fracture however evaluation of the left-sided  ribs is limited by the large pleural effusion and osteopenia. The patient's known posterior mediastinal mass is better appreciated on prior CT. IMPRESSION: Moderate to large left-sided pleural effusion, new since prior study. Electronically Signed   By: Constance Holster M.D.   On: 12/12/2019 01:52   DG Foot 2 Views Left  Result Date: 12/12/2019 CLINICAL DATA:  Pain status post fall EXAM: LEFT FOOT - 2 VIEW COMPARISON:  None. FINDINGS: There is no evidence of fracture or dislocation. There is no evidence of arthropathy or other focal bone abnormality. There is mild soft tissue swelling about the dorsal aspect of the forefoot. Is a small plantar calcaneal spur. There are degenerative changes of the midfoot. IMPRESSION: No acute displaced fracture. Electronically Signed   By: Constance Holster M.D.   On: 12/12/2019 01:46   DG C-Arm 1-60 Min-No Report  Result Date: 11/19/2019 Fluoroscopy was utilized by the requesting physician.  No radiographic interpretation.   CT Renal Stone Study  Result Date: 12/12/2019 CLINICAL DATA:  Lymphoma.  Hydronephrosis. EXAM: CT ABDOMEN AND PELVIS WITHOUT CONTRAST TECHNIQUE: Multidetector CT imaging of the abdomen and pelvis was performed following the standard protocol without IV contrast. COMPARISON:  Ultrasound 12/11/2019. PET scan 12/07/2019. CT 11/17/2019. FINDINGS: Lower chest: Enlarging effusions left more than right with atelectasis in both lower lungs. Lymphoma mass in the retrocrural space, increased in volume compared to the study of January 12th. Thickness of tumor in this region up to 3 cm. Previously this measured about 2 cm. Majority encasement of the aorta. Hepatobiliary: No visible liver parenchymal lesion. Previous cholecystectomy. Pancreas: Negative Spleen: Negative Adrenals/Urinary Tract: Left adrenal gland appears normal. Right adrenal gland difficult to separate from retroperitoneal mass. Left kidney shows a double-J ureteral stent in place. Mild fullness  of the left renal collecting system. Renal stent an ureter passes along side a left-sided retroperitoneal mass which has enlarged since the previous study, measuring approximately 4 x 7 cm compared with 2 x 4 cm previously. Hydronephrosis of the right kidney again seen, with multiple cysts as well. Tumor infiltrating the region of the hilum has increased in volume. Bladder appears unremarkable, except for the left double-J ureteral stent entering the bladder.  Stomach/Bowel: No evidence of bowel obstruction. Mild edema adjacent to the proximal sigmoid colon could possibly represent low level diverticulitis. This is more pronounced than was seen previously. Vascular/Lymphatic: See above regarding extensive retroperitoneal lymphoma tissue. Reproductive: Previous hysterectomy. Other: No free fluid or air. Musculoskeletal: Subacute fractures of the spine as seen previously, not grossly progressive. No lytic lesion or pathologic fracture of the pelvic bones. IMPRESSION: Pleural effusions larger on the left than the right with dependent atelectasis. Increase in tumor volume in the retrocrural region and the left retroperitoneum at the abdominopelvic junction region. See above. Increase in amount of tumor in the right renal hilar region. Double-J ureteral stent in place on the left. Mild fullness of the left renal collecting system. Hydronephrosis of the right kidney, likely obstructed by the tumor in the renal hilar region. Increased edema in the fat in the proximal sigmoid colon region suggests mild diverticulitis. Multiple lumbar compression fractures at not visibly progressed. Electronically Signed   By: Nelson Chimes M.D.   On: 12/12/2019 05:24   IR NEPHROSTOMY PLACEMENT LEFT  Result Date: 12/13/2019 INDICATION: 84 year old female with a history of left-sided hydronephrosis, referred for nephrostomy placement EXAM: IR NEPHROSTOMY PLACEMENT LEFT COMPARISON:  None. MEDICATIONS: None ANESTHESIA/SEDATION: Fentanyl 100  mcg IV; Versed 2.0 mg IV Moderate Sedation Time:  15 minutes The patient was continuously monitored during the procedure by the interventional radiology nurse under my direct supervision. CONTRAST:  6m OMNIPAQUE IOHEXOL 300 MG/ML SOLN - administered into the collecting system(s) FLUOROSCOPY TIME:  Fluoroscopy Time: 3 minutes 12 seconds (22 mGy). COMPLICATIONS: None PROCEDURE: Informed written consent was obtained from the patient after a thorough discussion of the procedural risks, benefits and alternatives. All questions were addressed. Maximal Sterile Barrier Technique was utilized including caps, mask, sterile gowns, sterile gloves, sterile drape, hand hygiene and skin antiseptic. A timeout was performed prior to the initiation of the procedure. Patient positioned prone position on the fluoroscopy table. Ultrasound survey of the left flank was performed with images stored and sent to PACs. The patient was then prepped and draped in the usual sterile fashion. 1% lidocaine was used to anesthetize the skin and subcutaneous tissues for local anesthesia. A Chiba needle was then used to access a posterior inferior calyx with ultrasound guidance. With spontaneous urine returned through the needle, passage of an 018 micro wire into the collecting system was performed under fluoroscopy. A small incision was made with an 11 blade scalpel, and the needle was removed from the wire. An Accustick system was then advanced over the wire into the collecting system under fluoroscopy. The metal stiffener and inner dilator were removed, and then a sample of fluid was aspirated through the 4 French outer sheath. Bentson wire was passed into the collecting system and the sheath removed. Ten French dilation of the soft tissues was performed. The dilator encountered a site of resistance and a kink in the wire developed. We then removed the dilator and pass the Accustick 4 French sheath over the wire into the collecting system. The  wire was then removed entirely, and a new 035 Amplatz wire was placed. Using modified Seldinger technique, a 10 French pigtail catheter drain was placed over the Amplatz wire. Wire and inner stiffener removed, and the pigtail was formed in the collecting system. Small amount of contrast confirmed position of the catheter. Patient tolerated the procedure well and remained hemodynamically stable throughout. No complications were encountered and no significant blood loss encountered IMPRESSION: Status post placement of left percutaneous  nephrostomy. Signed, Dulcy Fanny. Dellia Nims, RPVI [facility] Radiology Electronically Signed   By: Corrie Mckusick D.O.   On: 12/13/2019 15:24   VAS [place] LOWER EXTREMITY VENOUS (DVT)  Result Date: 12/13/2019  Lower Venous DVT Study Indications: Edema.  Limitations: Body habitus and edema. Comparison Study: Prior Right Lower extremity venous duplex from 06/09/2018 is                   available for comparison Performing Technologist: Sharion Dove RVS  Examination Guidelines: A complete evaluation includes B-mode imaging, spectral Doppler, color Doppler, and power Doppler as needed of all accessible portions of each vessel. Bilateral testing is considered an integral part of a complete examination. Limited examinations for reoccurring indications may be performed as noted. The reflux portion of the exam is performed with the patient in reverse Trendelenburg.  +---------+---------------+---------+-----------+----------+-------------------+ RIGHT    CompressibilityPhasicitySpontaneityPropertiesThrombus Aging      +---------+---------------+---------+-----------+----------+-------------------+ CFV      Full           Yes      Yes                                      +---------+---------------+---------+-----------+----------+-------------------+ SFJ      Full                                                              +---------+---------------+---------+-----------+----------+-------------------+ FV Prox  Full                                                             +---------+---------------+---------+-----------+----------+-------------------+ FV Mid   Full                                                             +---------+---------------+---------+-----------+----------+-------------------+ FV Distal               Yes      Yes                  patent by Color and                                                       Doppler             +---------+---------------+---------+-----------+----------+-------------------+ PFV      Full                                                             +---------+---------------+---------+-----------+----------+-------------------+  POP                     Yes      Yes                  patent by Color and                                                       Doppler             +---------+---------------+---------+-----------+----------+-------------------+ PTV      Full                                                             +---------+---------------+---------+-----------+----------+-------------------+ PERO     Full                                                             +---------+---------------+---------+-----------+----------+-------------------+   +---------+---------------+---------+-----------+----------+--------------+ LEFT     CompressibilityPhasicitySpontaneityPropertiesThrombus Aging +---------+---------------+---------+-----------+----------+--------------+ CFV      Full           Yes      Yes                                 +---------+---------------+---------+-----------+----------+--------------+ SFJ      Full                                                        +---------+---------------+---------+-----------+----------+--------------+ FV Prox  Full                                                         +---------+---------------+---------+-----------+----------+--------------+ FV Mid   Full                                                        +---------+---------------+---------+-----------+----------+--------------+ FV DistalFull                                                        +---------+---------------+---------+-----------+----------+--------------+ PFV      Full                                                        +---------+---------------+---------+-----------+----------+--------------+  POP      Full           Yes      Yes                                 +---------+---------------+---------+-----------+----------+--------------+ PTV      Full                                                        +---------+---------------+---------+-----------+----------+--------------+ PERO     Full                                                        +---------+---------------+---------+-----------+----------+--------------+     Summary: RIGHT: - Findings appear essentially unchanged compared to previous examination. - There is no evidence of deep vein thrombosis in the lower extremity. However, portions of this examination were limited- see technologist comments above.  LEFT: - There is no evidence of deep vein thrombosis in the lower extremity.  *See table(s) above for measurements and observations. Electronically signed by Ruta Hinds MD on 12/13/2019 at 11:40:24 AM.    Final    US THORACENTESIS ASP PLEURAL SPACE W/IMG GUIDE  Result Date: 12/12/2019 INDICATION: Patient with history of retroperitoneal malignant neoplasm was bilateral pleural effusion presents for therapeutic and diagnostic thoracentesis. EXAM: ULTRASOUND GUIDED THERAPEUTIC AND DIAGNOSTIC THORACENTESIS MEDICATIONS: Lidocaine 1% 10 mL COMPLICATIONS: None immediate. PROCEDURE: An ultrasound guided thoracentesis was thoroughly discussed with the patient and  questions answered. The benefits, risks, alternatives and complications were also discussed. The patient understands and wishes to proceed with the procedure. Written consent was obtained. Ultrasound was performed to localize and mark an adequate pocket of fluid in the left-sided chest. The area was then prepped and draped in the normal sterile fashion. 1% Lidocaine was used for local anesthesia. Under ultrasound guidance a catheter was introduced. Thoracentesis was performed. The catheter was removed and a dressing applied. FINDINGS: A total of approximately 650 mL of amber colored fluid was removed. Samples were sent to the laboratory as requested by the clinical team. IMPRESSION: Successful ultrasound guided therapeutic and diagnostic left-sided thoracentesis yielding 650 mL of pleural fluid. Read by Rushie Nyhan NP Electronically Signed   By: Corrie Mckusick D.O.   On: 12/12/2019 10:39     Subjective: Patient seen and examined.  Discussed with patient about going to [place] after procedure.  She had no pain.  She denies any nausea vomiting. She was happy to go to [place] as she lives there. Left-sided nephrostomy, urine output 1800 mL Minimal urine on her purewic catheter on the wall.   Discharge Exam: Vitals:   12/15/19 0518 12/15/19 0645  BP: (!) 177/95 (!) 175/92  Pulse: (!) 105 (!) 104  Resp: 18   Temp: 98.2 F (36.8 C)   SpO2: 95%    Vitals:   12/14/19 1712 12/14/19 2149 12/15/19 0518 12/15/19 0645  BP: (!) 164/82 (!) 169/89 (!) 177/95 (!) 175/92  Pulse: 90 99 (!) 105 (!) 104  Resp: _0 Temp: 98.6 F (37 C) 98.3 F (36.8 C) 98.2 F (36.8  C)   TempSrc: Oral Oral Oral   SpO2: 94% 97% 95%   Weight:      Height:        General: Pt is alert, awake, not in acute distress, on room air.  Not in any distress. Appropriate for her stated age.  Mostly in bed. Cardiovascular: RRR, S1/S2 +, no rubs, no gallops Respiratory: CTA bilaterally, no wheezing, no  rhonchi Abdominal: Soft, NT, ND, bowel sounds + Extremities: 2+ pedal edema, some tenderness present, no cyanosis Left-sided nephrostomy tube, free flow urine with sediments.    The results of significant diagnostics from this hospitalization (including imaging, microbiology, ancillary and laboratory) are listed below for reference.     Microbiology: Recent Results (from the past 240 hour(s))  Urine culture     Status: Abnormal   Collection Time: 12/12/19  3:04 AM   Specimen: Urine, Clean Catch  Result Value Ref Range Status   Specimen Description URINE, CLEAN CATCH  Final   Special Requests   Final    NONE Performed at [facility], [place] [address] [place], [place] [address]    Culture 40,000 COLONIES/mL ENTEROCOCCUS FAECALIS (A)  Final   Report Status 12/14/2019 FINAL  Final   Organism ID, Bacteria ENTEROCOCCUS FAECALIS (A)  Final      Susceptibility   Enterococcus faecalis - MIC*    AMPICILLIN 8 SENSITIVE Sensitive     NITROFURANTOIN <=16 SENSITIVE Sensitive     VANCOMYCIN 2 SENSITIVE Sensitive     * 40,000 COLONIES/mL ENTEROCOCCUS FAECALIS  SARS CORONAVIRUS 2 (TAT 6-24 HRS) Nasopharyngeal Nasopharyngeal Swab     Status: None   Collection Time: 12/12/19  4:24 AM   Specimen: Nasopharyngeal Swab  Result Value Ref Range Status   SARS Coronavirus 2 NEGATIVE NEGATIVE Final    Comment: (NOTE) SARS-CoV-2 target nucleic acids are NOT DETECTED. The SARS-CoV-2 RNA is generally detectable in upper and lower respiratory specimens during the acute phase of infection. Negative results do not preclude SARS-CoV-2 infection, do not rule out co-infections with other pathogens, and should not be used as the sole basis for treatment or other patient management decisions. Negative results must be combined with clinical observations, patient history, and epidemiological information. The expected result is Negative. Fact Sheet for  Patients: SugarRoll.be Fact Sheet for Healthcare Providers: https://www.woods-mathews.com/ This test is not yet approved or cleared by the [place] FDA and  has been authorized for detection and/or diagnosis of SARS-CoV-2 by FDA under an Emergency Use Authorization (EUA). This EUA will remain  in effect (meaning this test can be used) for the duration of the COVID-19 declaration under Section 56 4(b)(1) of the Act, 21 U.S.C. section 360bbb-3(b)(1), unless the authorization is terminated or revoked sooner. Performed at [facility], [place] [address], [place], [place] [address]   Gram stain     Status: None (Preliminary result)   Collection Time: 12/12/19 10:15 AM   Specimen: PATH Cytology Pleural fluid  Result Value Ref Range Status   Specimen Description PLEURAL LEFT  Final   Special Requests NONE  Final   Gram Stain   Final    MODERATE WBC PRESENT, PREDOMINANTLY MONONUCLEAR NO ORGANISMS SEEN Performed at [facility], [address] [place],  [address]    Report Status PENDING  Incomplete  Culture, body fluid-bottle     Status: None (Preliminary result)   Collection Time: 12/12/19 10:15 AM   Specimen: Pleura  Result Value Ref Range Status   Specimen  Description PLEURAL LEFT  Final   Special Requests NONE  Final   Culture   Final    NO GROWTH 3 DAYS Performed at [place] [address], [place], [place] [address]    Report Status PENDING  Incomplete     Labs: BNP (last 3 results) Recent Labs    07/29/19 2055 12/12/19 0048  BNP 139.8* 132.4*   Basic Metabolic Panel: Recent Labs  Lab 12/10/19 1422 12/12/19 0048 12/13/19 0633 12/14/19 0541 12/15/19 0630  NA 142 143 142 143 142  K 4.4 4.7 4.2 4.0 3.5  CL 113* 113* 113* 112* 107  CO2 18* 16* 17* 18* 22  GLUCOSE 101* 93 101* 94 94  BUN 30* 32* 32* 33* 30*  CREATININE 2.00* 2.91* 3.38* 3.47* 3.24*  CALCIUM 9.0 9.4 8.5* 8.4* 8.9   Liver  Function Tests: Recent Labs  Lab 12/12/19 0048  AST 18  ALT 10  ALKPHOS 49  BILITOT 0.5  PROT 7.9  ALBUMIN 2.8*   No results for input(s): LIPASE, AMYLASE in the last 168 hours. No results for input(s): AMMONIA in the last 168 hours. CBC: Recent Labs  Lab 12/10/19 1422 12/12/19 0048 12/13/19 0633 12/14/19 0541 12/15/19 0630  WBC 4.4 5.4 4.0 4.9 4.4  NEUTROABS 2.8 3.9 2.4 3.0 3.0  HGB 11.8* 13.1 11.5* 12.3 13.0  HCT 40.1 42.0 36.2 38.9 39.6  MCV 103.9* 98.8 97.6 97.3 94.1  PLT 168 170 131* 150 151   Cardiac Enzymes: No results for input(s): CKTOTAL, CKMB, CKMBINDEX, TROPONINI in the last 168 hours. BNP: Invalid input(s): POCBNP CBG: No results for input(s): GLUCAP in the last 168 hours. D-Dimer No results for input(s): DDIMER in the last 72 hours. Hgb A1c No results for input(s): HGBA1C in the last 72 hours. Lipid Profile No results for input(s): CHOL, HDL, LDLCALC, TRIG, CHOLHDL, LDLDIRECT in the last 72 hours. Thyroid function studies No results for input(s): TSH, T4TOTAL, T3FREE, THYROIDAB in the last 72 hours.  Invalid input(s): FREET3 Anemia work up No results for input(s): VITAMINB12, FOLATE, FERRITIN, TIBC, IRON, RETICCTPCT in the last 72 hours. Urinalysis    Component Value Date/Time   COLORURINE YELLOW 12/12/2019 0201   APPEARANCEUR HAZY (A) 12/12/2019 0201   LABSPEC 1.016 12/12/2019 0201   PHURINE 6.0 12/12/2019 0201   GLUCOSEU NEGATIVE 12/12/2019 0201   HGBUR LARGE (A) 12/12/2019 0201   BILIRUBINUR NEGATIVE 12/12/2019 0201   BILIRUBINUR Negative 11/10/2019 1643   KETONESUR NEGATIVE 12/12/2019 0201   PROTEINUR 30 (A) 12/12/2019 0201   UROBILINOGEN 0.2 11/10/2019 1643   NITRITE NEGATIVE 12/12/2019 0201   LEUKOCYTESUR MODERATE (A) 12/12/2019 0201   Sepsis Labs Invalid input(s): PROCALCITONIN,  WBC,  LACTICIDVEN Microbiology Recent Results (from the past 240 hour(s))  Urine culture     Status: Abnormal   Collection Time: 12/12/19  3:04 AM    Specimen: Urine, Clean Catch  Result Value Ref Range Status   Specimen Description URINE, CLEAN CATCH  Final   Special Requests   Final    NONE Performed at [facility], [place] [address], [place], [place] [address]    Culture 40,000 COLONIES/mL ENTEROCOCCUS FAECALIS (A)  Final   Report Status 12/14/2019 FINAL  Final   Organism ID, Bacteria ENTEROCOCCUS FAECALIS (A)  Final      Susceptibility   Enterococcus faecalis - MIC*    AMPICILLIN 8 SENSITIVE Sensitive     NITROFURANTOIN <=16 SENSITIVE Sensitive     VANCOMYCIN 2 SENSITIVE Sensitive     *  40,000 COLONIES/mL ENTEROCOCCUS FAECALIS  SARS CORONAVIRUS 2 (TAT 6-24 HRS) Nasopharyngeal Nasopharyngeal Swab     Status: None   Collection Time: 12/12/19  4:24 AM   Specimen: Nasopharyngeal Swab  Result Value Ref Range Status   SARS Coronavirus 2 NEGATIVE NEGATIVE Final    Comment: (NOTE) SARS-CoV-2 target nucleic acids are NOT DETECTED. The SARS-CoV-2 RNA is generally detectable in upper and lower respiratory specimens during the acute phase of infection. Negative results do not preclude SARS-CoV-2 infection, do not rule out co-infections with other pathogens, and should not be used as the sole basis for treatment or other patient management decisions. Negative results must be combined with clinical observations, patient history, and epidemiological information. The expected result is Negative. Fact Sheet for Patients: SugarRoll.be Fact Sheet for Healthcare Providers: https://www.woods-mathews.com/ This test is not yet approved or cleared by the [place] FDA and  has been authorized for detection and/or diagnosis of SARS-CoV-2 by FDA under an Emergency Use Authorization (EUA). This EUA will remain  in effect (meaning this test can be used) for the duration of the COVID-19 declaration under Section 56 4(b)(1) of the Act, 21 U.S.C. section 360bbb-3(b)(1), unless the authorization is  terminated or revoked sooner. Performed at [facility], [place] [address], [place], [place] [address]   Gram stain     Status: None (Preliminary result)   Collection Time: 12/12/19 10:15 AM   Specimen: PATH Cytology Pleural fluid  Result Value Ref Range Status   Specimen Description PLEURAL LEFT  Final   Special Requests NONE  Final   Gram Stain   Final    MODERATE WBC PRESENT, PREDOMINANTLY MONONUCLEAR NO ORGANISMS SEEN Performed at [facility], [address] [place], [place] [address]    Report Status PENDING  Incomplete  Culture, body fluid-bottle     Status: None (Preliminary result)   Collection Time: 12/12/19 10:15 AM   Specimen: Pleura  Result Value Ref Range Status   Specimen Description PLEURAL LEFT  Final   Special Requests NONE  Final   Culture   Final    NO GROWTH 3 DAYS Performed at [facility], [address], [place], [place] [address]    Report Status PENDING  Incomplete     Time coordinating discharge:  40 minutes  SIGNED:   Barb Merino, MD  [facility] 12/15/2019, 8:21 AM

## 2019-12-15 NOTE — Plan of Care (Signed)

## 2019-12-15 NOTE — Progress Notes (Signed)
D: Pt alert and oriented. Pt reports experiencing any pain some pain at this time, prn medication was given. Pt's family member is at pt's side.   A: Scheduled medications administered to pt, per MD orders. Support and encouragement provided. Frequent verbal contact made.   R: No adverse drug reactions noted. Pt complaint with medications and treatment plan. Pt interacts well with staff on the unit. Pt is stable at this time, will continue to monitor and provide care for as ordered.  Skin assessment was performed with swat RN.

## 2019-12-15 NOTE — H&P (Signed)
History and Physical    Regina Harris CZY:[id number] DOB: 1936-09-05 DOA: 12/15/2019  PCP: Abner Greenspan, MD  Patient coming from: [facility]  I have personally briefly reviewed patient's old medical records in [place]  Chief Complaint: Multiple myeloma  HPI: Regina Harris is a 84 y.o. female with medical history significant for retroperitoneal neoplasm highly suspected to be kappa chain myeloma, hypertension, hyperlipidemia, and hypothyroidism who is transferred to [facility] hospital from [facility] for initiation of chemotherapy.  Patient has been hospitalized at [facility] from 12/11/2019-12/15/2019 for acute renal failure due to obstructive uropathy with bilateral hydronephrosis.  She was also noted to have moderate to large left-sided pleural effusion and underwent ultrasound-guided thoracentesis on 12/12/2019 with removal of 650 mL of pleural fluid.  Cytology showed presence of malignant cells.  On 12/13/2019 she had a left-sided percutaneous nephrostomy tube placed by interventional radiology.  Prior to transfer to [facility] on 12/15/2019 she underwent a bone marrow biopsy with pathology still pending.  She was also begun on treatment of UTI with amoxicillin x7 days for 40,000 colonies Enterococcus faecalis seen on urine culture.  Patient currently has no complaints other than continued back and lower extremity pain.  Her granddaughter, [name], is at bedside.  They have had thorough discussions regarding her plan of care.  They are agreeable to try chemotherapy with primary goals focusing on improvement in performance status.  If not having significant improvement in performance status/quality of life, they anticipate returning to home with palliative/hospice services.  Review of Systems: All systems reviewed and are negative except as documented in history of present illness above.   Past Medical History:  Diagnosis Date  . Barrett esophagus   . Carotid bruit     . Diabetes mellitus without complication ([place])   . Esophagitis   . Gastritis 2013  . GERD (gastroesophageal reflux disease)   . HH (hiatus hernia)   . History of colonic polyps   . History of repair of right rotator cuff   . Hyperlipidemia   . Hypertension   . Hyperthyroidism 06/09/2018   Per [facility] RAI Therapy for Hyperthyroidism order  . Lump or mass in breast   . Pneumonia   . Polycythemia, secondary   . Tobacco abuse     Past Surgical History:  Procedure Laterality Date  . ABDOMINAL HYSTERECTOMY    . APPENDECTOMY    . BREAST BIOPSY Right 1996  . BREAST BIOPSY Left 10/09/2012   Benign breast tissue with focal fat necrosis and focal periductal chronic inflammation.  Marland Kitchen BREAST BIOPSY Left 10/09/2012   Benign breast tissue with focal fat necrosis and focal periductal chronic inflammation.  Marland Kitchen CATARACT EXTRACTION  3/11   Dr Charise Killian  . CHOLECYSTECTOMY    . COLONOSCOPY  2009  . CYSTOSCOPY WITH URETEROSCOPY AND STENT PLACEMENT Left 11/19/2019   Procedure: CYSTOSCOPY WITH URETEROSCOPY AND STENT PLACEMENT;  Surgeon: Billey Co, MD;  Location: [facility] ORS;  Service: Urology;  Laterality: Left;  . ESOPHAGOGASTRODUODENOSCOPY (EGD) WITH PROPOFOL N/A 11/02/2015   Procedure: ESOPHAGOGASTRODUODENOSCOPY (EGD) WITH PROPOFOL;  Surgeon: Manya Silvas, MD;  Location: [facility] ENDOSCOPY;  Service: Endoscopy;  Laterality: N/A;  . IR NEPHROSTOMY PLACEMENT LEFT  12/13/2019  . SHOULDER ARTHROSCOPY WITH OPEN ROTATOR CUFF REPAIR Right 03/05/2016   Procedure: SHOULDER ARTHROSCOPY WITH OPEN ROTATOR CUFF REPAIR;  Surgeon: Earnestine Leys, MD;  Location: [facility] ORS;  Service: Orthopedics;  Laterality: Right;  . UPPER GI ENDOSCOPY  2013  Social History:  reports that she has quit smoking. Her smoking use included cigarettes. She smoked 1.00 pack per day. She has never used smokeless tobacco. She reports that she does not drink alcohol or use drugs.  Allergies  Allergen Reactions  . Benadryl [Diphenhydramine]  Other (See Comments)    Hallucinations; family request to not give this medication.    Family History  Problem Relation Age of Onset  . Kidney cancer Father   . Diabetes Mother   . Breast cancer Neg Hx      Prior to Admission medications   Medication Sig Start Date End Date Taking? Authorizing Provider  acetaminophen (TYLENOL) 500 MG tablet Take 2 tablets (1,000 mg total) by mouth 3 (three) times daily. 11/20/19   Danford, Suann Larry, MD  amoxicillin (AMOXIL) 250 MG capsule Take 1 capsule (250 mg total) by mouth every 12 (twelve) hours for 6 days. 12/15/19 12/21/19  Barb Merino, MD  aspirin EC 81 MG tablet Take 81 mg by mouth daily.    [provider]  atorvastatin (LIPITOR) 10 MG tablet TAKE 1 TABLET BY MOUTH ONCE DAILY Patient taking differently: Take 10 mg by mouth daily.  08/04/19   Tower, Wynelle Fanny, MD  Calcium Carb-Cholecalciferol (CALCIUM 1000 + D) 1000-800 MG-UNIT TABS Take 1 tablet by mouth 2 (two) times daily.     [provider]  cyanocobalamin 1000 MCG tablet Take 1,000 mcg by mouth daily.    [provider]  donepezil (ARICEPT) 5 MG tablet TAKE 1 TABLET BY MOUTH AT BEDTIME Patient taking differently: Take 5 mg by mouth at bedtime.  11/30/19   Tower, Wynelle Fanny, MD  gabapentin (NEURONTIN) 400 MG capsule TAKE 1 CAPSULE BY MOUTH TWICE DAILY Patient taking differently: Take 400 mg by mouth 2 (two) times daily.  08/04/19   Tower, Wynelle Fanny, MD  levothyroxine (SYNTHROID) 75 MCG tablet Take 75 mcg by mouth daily with breakfast.  07/21/19   [provider]  megestrol (MEGACE) 40 MG tablet Take 1 tablet (40 mg total) by mouth daily. 11/27/19   Lloyd Huger, MD  omeprazole (PRILOSEC) 20 MG capsule TAKE 1 CAPSULE BY MOUTH TWICE DAILY Patient taking differently: Take 20 mg by mouth 2 (two) times daily.  07/22/19   Tower, Wynelle Fanny, MD  sodium bicarbonate 650 MG tablet Take 2 tablets (1,300 mg total) by mouth 2 (two) times daily. 12/15/19   Barb Merino, MD   traMADol (ULTRAM) 50 MG tablet Take 1 tablet (50 mg total) by mouth every 6 (six) hours as needed. Patient taking differently: Take 50 mg by mouth every 6 (six) hours as needed for moderate pain.  12/10/19   Lloyd Huger, MD  potassium chloride (K-DUR) 10 MEQ tablet Take 1 tablet (10 mEq total) by mouth daily. 01/04/14 05/02/14  Abner Greenspan, MD    Physical Exam: Vitals:   12/15/19 1307 12/15/19 1556 12/15/19 2015  BP: (!) 167/87 (!) 163/84 (!) 169/98  Pulse: (!) 105 96 98  Resp: 20 16   Temp: 97.6 F (36.4 C) 98 F (36.7 C) 97.8 F (36.6 C)  TempSrc: Oral Oral Oral  SpO2: 95% 96% 97%   Constitutional: Elderly woman resting supine in bed, NAD, calm, appears tired but comfortable Eyes: PERRL, lids and conjunctivae normal ENMT: Mucous membranes are moist. Posterior pharynx clear of any exudate or lesions. Neck: normal, supple, no masses. Respiratory: clear to auscultation bilaterally, no wheezing, no crackles. Normal respiratory effort. No accessory muscle use.  Cardiovascular: Regular  rate and rhythm, no murmurs / rubs / gallops. No extremity edema. Abdomen: no tenderness, no masses palpated. No hepatosplenomegaly. Bowel sounds positive.  GU: Left nephrostomy tube in place with ~ 200 ccs amber-colored urine in collecting bag Musculoskeletal: no clubbing / cyanosis. No joint deformity upper and lower extremities. Good ROM, no contractures. Normal muscle tone.  Skin: Few sutures in place plantar surface of the base of the left fourth toe without open wound or discharge.  No rashes, ulcers. No induration Neurologic: CN 2-12 grossly intact. Sensation intact, Strength 5/5 in all 4.  Psychiatric: Awake, alert and oriented x 3. Normal mood.    Labs on Admission: I have personally reviewed following labs and imaging studies  CBC: Recent Labs  Lab 12/10/19 1422 12/12/19 0048 12/13/19 0633 12/14/19 0541 12/15/19 0630  WBC 4.4 5.4 4.0 4.9 4.4  NEUTROABS 2.8 3.9 2.4 3.0 3.0  HGB  11.8* 13.1 11.5* 12.3 13.0  HCT 40.1 42.0 36.2 38.9 39.6  MCV 103.9* 98.8 97.6 97.3 94.1  PLT 168 170 131* 150 833   Basic Metabolic Panel: Recent Labs  Lab 12/10/19 1422 12/12/19 0048 12/13/19 0633 12/14/19 0541 12/15/19 0630  NA 142 143 142 143 142  K 4.4 4.7 4.2 4.0 3.5  CL 113* 113* 113* 112* 107  CO2 18* 16* 17* 18* 22  GLUCOSE 101* 93 101* 94 94  BUN 30* 32* 32* 33* 30*  CREATININE 2.00* 2.91* 3.38* 3.47* 3.24*  CALCIUM 9.0 9.4 8.5* 8.4* 8.9   GFR: Estimated Creatinine Clearance: 16.4 mL/min (A) (by C-G formula based on SCr of 3.24 mg/dL (H)). Liver Function Tests: Recent Labs  Lab 12/12/19 0048  AST 18  ALT 10  ALKPHOS 49  BILITOT 0.5  PROT 7.9  ALBUMIN 2.8*   No results for input(s): LIPASE, AMYLASE in the last 168 hours. No results for input(s): AMMONIA in the last 168 hours. Coagulation Profile: No results for input(s): INR, PROTIME in the last 168 hours. Cardiac Enzymes: No results for input(s): CKTOTAL, CKMB, CKMBINDEX, TROPONINI in the last 168 hours. BNP (last 3 results) No results for input(s): PROBNP in the last 8760 hours. HbA1C: No results for input(s): HGBA1C in the last 72 hours. CBG: No results for input(s): GLUCAP in the last 168 hours. Lipid Profile: No results for input(s): CHOL, HDL, LDLCALC, TRIG, CHOLHDL, LDLDIRECT in the last 72 hours. Thyroid Function Tests: No results for input(s): TSH, T4TOTAL, FREET4, T3FREE, THYROIDAB in the last 72 hours. Anemia Panel: No results for input(s): VITAMINB12, FOLATE, FERRITIN, TIBC, IRON, RETICCTPCT in the last 72 hours. Urine analysis:    Component Value Date/Time   COLORURINE YELLOW 12/12/2019 0201   APPEARANCEUR HAZY (A) 12/12/2019 0201   LABSPEC 1.016 12/12/2019 0201   PHURINE 6.0 12/12/2019 0201   GLUCOSEU NEGATIVE 12/12/2019 0201   HGBUR LARGE (A) 12/12/2019 0201   BILIRUBINUR NEGATIVE 12/12/2019 0201   BILIRUBINUR Negative 11/10/2019 1643   KETONESUR NEGATIVE 12/12/2019 0201    PROTEINUR 30 (A) 12/12/2019 0201   UROBILINOGEN 0.2 11/10/2019 1643   NITRITE NEGATIVE 12/12/2019 0201   LEUKOCYTESUR MODERATE (A) 12/12/2019 0201    Radiological Exams on Admission: CT BONE MARROW BIOPSY & ASPIRATION  Result Date: 12/15/2019 INDICATION: 84 year old with concern for myeloma.  Known retroperitoneal lesion. EXAM: CT GUIDED BONE MARROW ASPIRATES AND BIOPSY Physician: Stephan Minister. Anselm Pancoast, MD MEDICATIONS: None. ANESTHESIA/SEDATION: Fentanyl 25 mcg IV; Versed 0.5 mg IV Moderate Sedation Time:  12 minutes The patient was continuously monitored during the procedure by the interventional radiology  nurse under my direct supervision. COMPLICATIONS: None immediate. PROCEDURE: The procedure was explained to the patient. The risks and benefits of the procedure were discussed and the patient's questions were addressed. Informed consent was obtained from the patient. The patient was placed prone on CT table. Images of the pelvis were obtained. The left side of back was prepped and draped in sterile fashion. The skin and left posterior ilium were anesthetized with 1% lidocaine. 11 gauge bone needle was directed into the left ilium with CT guidance. Two aspirates and one core biopsy were obtained. Bandage placed over the puncture site. FINDINGS: Again noted is soft tissue fullness along the left lower periaortic region. Partial visualization of the right kidney with renal cysts. Small amount of fluid or edema in the presacral region. Bones are heterogeneous with subtle areas of lucency. Bone needle was directed into the posterior left ilium. Adequate bone marrow samples were obtained. IMPRESSION: CT guided bone marrow aspiration and core biopsy. Electronically Signed   By: Markus Daft M.D.   On: 12/15/2019 10:46    EKG: 12/11/2019 EKG independently reviewed. Sinus rhythm without acute ischemic changes.  Assessment/Plan Principal Problem:   Kappa light chain myeloma (HCC) Active Problems:   Essential  hypertension   Hypothyroidism   AKI (acute kidney injury) ([place])  Regina Harris is a 84 y.o. female with medical history significant for retroperitoneal neoplasm highly suspected to be kappa chain myeloma, hypertension, hyperlipidemia, and hypothyroidism who is transferred to [facility] hospital for chemotherapy for kappa light chain myeloma.  Kappa light chain myeloma/retroperitoneal malignancy: IR guided bone marrow biopsy 12/15/2019 is pending.  Oncology are following and plan for initial inpatient chemotherapy with Velcade, Cytoxan, dexamethasone. -Appreciate oncology assistance -Continue pain control as needed  Acute kidney injury: In setting of myeloma kidney and obstructive uropathy.  S/p left percutaneous nephrostomy tube placed on 12/13/2019. -Continues to have good output from nephrostomy -Creatinine remains elevated but stable, continue to monitor -Lasix has been discontinued  Left-sided pleural effusion: S/p ultrasound-guided thoracentesis on 12/12/2019 with removal of 650 mL of pleural fluid.  Cytology showed presence of malignant cells.  Currently stable and saturating well on room air.  UTI: Urine culture collected 12/12/2019 grew 40,000 colonies Enterococcus faecalis.  She has been started on amoxicillin which we will continue to complete 7 days.  Hypertension: BP mildly elevated in setting of pain.  Continue to monitor, can use IV labetalol as needed.  Avoid addition of ACE/ARB/thiazide in setting of renal dysfunction.  Hypothyroidism: Continue Synthroid.  Hyperlipidemia: Continue atorvastatin.  Generalized weakness/deconditioning: Request PT/OT evaluation.  Mild cognitive impairment: Continue home donepezil.  DVT prophylaxis: Lovenox Code Status: DNR Family Communication: Discussed with patient's granddaughter [name] at bedside Disposition Plan: Pending response to chemotherapy and performance status, PT/OT eval.  If performance status not significantly improving, family  wish to take patient home with hospice/palliative care.  Estimated discharge is in 3-4 days per oncology. Consults called: Oncology Admission status: Inpatient for induction chemotherapy.  Zada Finders MD [facility]  If 7PM-7AM, please contact night-coverage [email or web address]  12/15/2019, 8:33 PM

## 2019-12-15 NOTE — Progress Notes (Signed)
Call and gave report to [facility], RN at [facility].   Mickel Baas B. RN

## 2019-12-15 NOTE — Consult Note (Signed)
[facility] Progress Note    Assessment/ Plan:   Acute Kidney Injury: Obstructive + nephrotic syndrome 2/2 possible myeloma kidney disease. S/p left nephrostomy tube placement on 2/7. Continues to have good UOP with 1.8L overnight. Kidney function continuing to slowly improve (3.47>3.24). Electrolytes stable. Continue current treatment. Hold lasix.  2. Hyperchloremic metabolic acidosis: resolved, on bicarb - can likely discontinue at discharge  3. Hydronephrosis: s/p left percutaneous nephrostomy tube placement on 2/7. Urology and IR following.  - will need to continue to follow up outpatient for care of nephrostomy tube   4. Retroperitoneal Malignancy  Kappa light chain paraproteinemia: Bone barrow biopsy planned for today. Palliative discussion yesterday - patient has opted to transfer to [facility] for chemotherapy. Will determine long term plans pending her status after a few days of treatment. Urology and Oncology following.  5. R>L pleural effusion: s/p u/s guided diagnostic and therapeutic left pleural thoracentesis on 2/6. Concern for malignancy effusion due to elevated abnormal cells. Cytology pending.  - follow up outpatient for care  6. Dispo:  transfer [facility] for inpatient chemotherapy treatment after bone marrow biopsy. If improvement, home for continued treatment vs no improvement, home with home hospice.   Appreciate the opportunity to help care for this patient. Nephrology team will sign off. We are available if further assistance is needed. Thank you.  Subjective:   NO acute concerns or events overnight.   Objective:   BP (!) 175/92   Pulse (!) 104   Temp 98.2 F (36.8 C) (Oral)   Resp 18   Ht 5' 7"  (1.702 m)   Wt 98 kg   SpO2 95%   BMI 33.83 kg/m   Intake/Output Summary (Last 24 hours) at 12/15/2019 6237 Last data filed at 12/15/2019 0600 Gross per 24 hour  Intake 1190 ml  Output 1800 ml  Net -610 ml   Weight change:   Physical Exam: General:  pleasant older lady, appears chronically ill, in no acute distress with non-toxic appearance CV: regular rate and rhythm without murmurs, rubs, or gallops, no lower extremity edema Lungs: clear to auscultation bilaterally with normal work of breathing on RA Abdomen: soft, non-tender Skin: warm, dry Extremities: warm and well perfused  Imaging: NM Renal Imaging Flow W/O Pharm  Result Date: 12/14/2019 CLINICAL DATA:  Hydronephrosis, abnormal CT, LEFT renal collecting system dilatation with prior stenting, RIGHT hydronephrosis with tumor invasion of RIGHT renal hilum, history lymphoma EXAM: NUCLEAR MEDICINE RENAL SCAN TECHNIQUE: Radionuclide angiographic and sequential renal images were obtained after intravenous injection of radiopharmaceutical. Lasix not administered due to new acute renal failure. RADIOPHARMACEUTICALS:  5.38 mCi Technetium-84mMAG3 IV COMPARISON:  CT abdomen and pelvis 12/12/2019 FINDINGS: Blood flow: Poor tracer bolus. Probable delayed blood flow to both kidneys though this is suboptimally assessed. LEFT kidney: Delayed uptake, concentration and excretion of tracer by LEFT kidney. Central photopenia early in exam corresponds to mildly dilated collecting system. Delayed excretion of into the dilated collecting system. Significant retained tracer within the dilated collecting system and the renal cortex at the conclusion of the exam. Analysis of the renogram curve demonstrates a continually increasing curve, with delayed time to peak activity of 42.7 minutes. RIGHT kidney: Delayed and concentration of tracer by RIGHT kidney. The upper and mid portions of the RIGHT kidney demonstrate no renal function, corresponding to marked hydronephrosis and tumor invasion on CT. No significant excretion of tracer into a dilated collecting system is seen over the course of the exam. No ureteral activity identified. Significant retained  tracer at the inferior pole the RIGHT kidney at the conclusion of the  study. Analysis of the renogram curve demonstrates a continually climbing curved peaking at the conclusion of the exam at 45.7 minutes. Differential renal function: LEFT: 82% RIGHT: 18% IMPRESSION: Absent function at the upper and mid RIGHT kidney corresponding to hydronephrosis and tumor invasion on CT. Markedly impaired BILATERAL renal function with significantly delayed uptake, concentration and excretion of tracer, evidence by continually increasing renogram curves bilaterally. Asymmetric renal function, markedly greater on LEFT than RIGHT. Electronically Signed   By: Lavonia Dana M.D.   On: 12/14/2019 08:41   IR NEPHROSTOMY PLACEMENT LEFT  Result Date: 12/13/2019 INDICATION: 84 year old female with a history of left-sided hydronephrosis, referred for nephrostomy placement EXAM: IR NEPHROSTOMY PLACEMENT LEFT COMPARISON:  None. MEDICATIONS: None ANESTHESIA/SEDATION: Fentanyl 100 mcg IV; Versed 2.0 mg IV Moderate Sedation Time:  15 minutes The patient was continuously monitored during the procedure by the interventional radiology nurse under my direct supervision. CONTRAST:  3m OMNIPAQUE IOHEXOL 300 MG/ML SOLN - administered into the collecting system(s) FLUOROSCOPY TIME:  Fluoroscopy Time: 3 minutes 12 seconds (22 mGy). COMPLICATIONS: None PROCEDURE: Informed written consent was obtained from the patient after a thorough discussion of the procedural risks, benefits and alternatives. All questions were addressed. Maximal Sterile Barrier Technique was utilized including caps, mask, sterile gowns, sterile gloves, sterile drape, hand hygiene and skin antiseptic. A timeout was performed prior to the initiation of the procedure. Patient positioned prone position on the fluoroscopy table. Ultrasound survey of the left flank was performed with images stored and sent to PACs. The patient was then prepped and draped in the usual sterile fashion. 1% lidocaine was used to anesthetize the skin and subcutaneous tissues for  local anesthesia. A Chiba needle was then used to access a posterior inferior calyx with ultrasound guidance. With spontaneous urine returned through the needle, passage of an 018 micro wire into the collecting system was performed under fluoroscopy. A small incision was made with an 11 blade scalpel, and the needle was removed from the wire. An Accustick system was then advanced over the wire into the collecting system under fluoroscopy. The metal stiffener and inner dilator were removed, and then a sample of fluid was aspirated through the 4 French outer sheath. Bentson wire was passed into the collecting system and the sheath removed. Ten French dilation of the soft tissues was performed. The dilator encountered a site of resistance and a kink in the wire developed. We then removed the dilator and pass the Accustick 4 French sheath over the wire into the collecting system. The wire was then removed entirely, and a new 035 Amplatz wire was placed. Using modified Seldinger technique, a 10 French pigtail catheter drain was placed over the Amplatz wire. Wire and inner stiffener removed, and the pigtail was formed in the collecting system. Small amount of contrast confirmed position of the catheter. Patient tolerated the procedure well and remained hemodynamically stable throughout. No complications were encountered and no significant blood loss encountered IMPRESSION: Status post placement of left percutaneous nephrostomy. Signed, JDulcy Fanny WDellia Nims RPVI [facility] Electronically Signed   By: JCorrie MckusickD.O.   On: 12/13/2019 15:24    Labs: BMET Recent Labs  Lab 12/10/19 1422 12/12/19 0048 12/13/19 0633 12/14/19 0541  NA 142 143 142 143  K 4.4 4.7 4.2 4.0  CL 113* 113* 113* 112*  CO2 18* 16* 17* 18*  GLUCOSE 101* 93 101*  94  BUN 30* 32* 32* 33*  CREATININE 2.00* 2.91* 3.38* 3.47*  CALCIUM 9.0 9.4 8.5* 8.4*   CBC Recent Labs  Lab  12/12/19 0048 12/13/19 0633 12/14/19 0541 12/15/19 0630  WBC 5.4 4.0 4.9 4.4  NEUTROABS 3.9 2.4 3.0 3.0  HGB 13.1 11.5* 12.3 13.0  HCT 42.0 36.2 38.9 39.6  MCV 98.8 97.6 97.3 94.1  PLT 170 131* 150 151    Medications:     amoxicillin  250 mg Oral Q12H   aspirin EC  81 mg Oral Daily   atorvastatin  10 mg Oral Daily   donepezil  5 mg Oral QHS   levothyroxine  75 mcg Oral Q0600   pantoprazole  40 mg Oral BID   sodium bicarbonate  1,300 mg Oral BID     Mina Marble, DO [facility] Resident, PGY2 12/15/2019, 8:08 AM

## 2019-12-15 NOTE — Procedures (Signed)
Interventional Radiology Procedure:   Indications: Concern for myeloma  Procedure: CT guided bone marrow biopsy  Findings: 2 aspirates and 1 core from left ilium  Complications: None     EBL: Minimal, less than 10 ml  Plan: Bedrest 1 hour.   Regina Molenda R. Anselm Pancoast, MD  Pager: [phone]

## 2019-12-15 NOTE — Progress Notes (Signed)
START ON PATHWAY REGIMEN - Multiple Myeloma and Other Plasma Cell Dyscrasias     A cycle is every 28 days:     Dexamethasone      Bortezomib      Cyclophosphamide   **Always confirm dose/schedule in your pharmacy ordering system**  Patient Characteristics: Multiple Myeloma, Newly Diagnosed, Transplant Ineligible or Refused, High Risk Disease Classification: Multiple Myeloma R-ISS Staging: Unknown Therapeutic Status: Newly Diagnosed Is Patient Eligible for Transplant<= Transplant Ineligible or Refused Risk Status: High Risk Intent of Therapy: Non-Curative / Palliative Intent, Discussed with Patient

## 2019-12-16 DIAGNOSIS — C9000 Multiple myeloma not having achieved remission: Secondary | ICD-10-CM

## 2019-12-16 DIAGNOSIS — Z515 Encounter for palliative care: Secondary | ICD-10-CM

## 2019-12-16 LAB — COMPREHENSIVE METABOLIC PANEL
ALT: 9 U/L (ref 0–44)
AST: 12 U/L — ABNORMAL LOW (ref 15–41)
Albumin: 2.3 g/dL — ABNORMAL LOW (ref 3.5–5.0)
Alkaline Phosphatase: 43 U/L (ref 38–126)
Anion gap: 11 (ref 5–15)
BUN: 30 mg/dL — ABNORMAL HIGH (ref 8–23)
CO2: 22 mmol/L (ref 22–32)
Calcium: 8.5 mg/dL — ABNORMAL LOW (ref 8.9–10.3)
Chloride: 108 mmol/L (ref 98–111)
Creatinine, Ser: 2.52 mg/dL — ABNORMAL HIGH (ref 0.44–1.00)
GFR calc Af Amer: 20 mL/min — ABNORMAL LOW (ref >60)
GFR calc non Af Amer: 17 mL/min — ABNORMAL LOW (ref >60)
Glucose, Bld: 92 mg/dL (ref 70–99)
Potassium: 3.5 mmol/L (ref 3.5–5.1)
Sodium: 141 mmol/L (ref 135–145)
Total Bilirubin: 0.6 mg/dL (ref 0.3–1.2)
Total Protein: 6.9 g/dL (ref 6.5–8.1)

## 2019-12-16 LAB — CBC WITH DIFFERENTIAL/PLATELET
Abs Immature Granulocytes: 0.01 10*3/uL (ref 0.00–0.07)
Basophils Absolute: 0 10*3/uL (ref 0.0–0.1)
Basophils Relative: 0 %
Eosinophils Absolute: 0.1 10*3/uL (ref 0.0–0.5)
Eosinophils Relative: 1 %
HCT: 38.3 % (ref 36.0–46.0)
Hemoglobin: 12.5 g/dL (ref 12.0–15.0)
Immature Granulocytes: 0 %
Lymphocytes Relative: 13 %
Lymphs Abs: 0.7 10*3/uL (ref 0.7–4.0)
MCH: 30.4 pg (ref 26.0–34.0)
MCHC: 32.6 g/dL (ref 30.0–36.0)
MCV: 93.2 fL (ref 80.0–100.0)
Monocytes Absolute: 0.8 10*3/uL (ref 0.1–1.0)
Monocytes Relative: 16 %
Neutro Abs: 3.5 10*3/uL (ref 1.7–7.7)
Neutrophils Relative %: 70 %
Platelets: 148 10*3/uL — ABNORMAL LOW (ref 150–400)
RBC: 4.11 MIL/uL (ref 3.87–5.11)
RDW: 12.1 % (ref 11.5–15.5)
WBC: 5 10*3/uL (ref 4.0–10.5)
nRBC: 0 % (ref 0.0–0.2)

## 2019-12-16 LAB — PH, BODY FLUID: pH, Body Fluid: 7.3

## 2019-12-16 MED ORDER — BORTEZOMIB CHEMO SQ INJECTION 3.5 MG (2.5MG/ML)
1.5000 mg/m2 | Freq: Once | INTRAMUSCULAR | Status: DC
  Filled 2019-12-16: qty 1.3

## 2019-12-16 MED ORDER — SODIUM CHLORIDE 0.9 % IV SOLN
232.0000 mg/m2 | Freq: Once | INTRAVENOUS | Status: DC
  Filled 2019-12-16: qty 25

## 2019-12-16 MED ORDER — SODIUM CHLORIDE 0.9 % IV SOLN
232.0000 mg/m2 | Freq: Once | INTRAVENOUS | Status: AC
  Filled 2019-12-16: qty 25

## 2019-12-16 MED ORDER — SODIUM CHLORIDE 0.9 % IV SOLN
20.0000 mg | Freq: Once | INTRAVENOUS | Status: DC
  Filled 2019-12-16: qty 2

## 2019-12-16 MED ORDER — METHOCARBAMOL 500 MG PO TABS: 500.0000 mg | ORAL_TABLET | Freq: Three times a day (TID) | ORAL | Status: DC | PRN

## 2019-12-16 MED ORDER — SODIUM CHLORIDE 0.9 % IV SOLN
20.0000 mg | Freq: Once | INTRAVENOUS | Status: AC
  Filled 2019-12-16 (×2): qty 2

## 2019-12-16 MED ORDER — SODIUM CHLORIDE 0.9 % IV SOLN
500.0000 mg | Freq: Once | INTRAVENOUS | Status: AC
  Administered 2019-12-16: 500 mg via INTRAVENOUS
  Filled 2019-12-16: qty 25

## 2019-12-16 MED ORDER — SODIUM CHLORIDE 0.9 % IV SOLN: Freq: Once | INTRAVENOUS | Status: AC

## 2019-12-16 MED ORDER — SODIUM CHLORIDE 0.9 % IV SOLN
20.0000 mg | Freq: Once | INTRAVENOUS | Status: AC
  Administered 2019-12-16: 20 mg via INTRAVENOUS
  Filled 2019-12-16: qty 2

## 2019-12-16 MED ORDER — PALONOSETRON HCL INJECTION 0.25 MG/5ML
0.2500 mg | Freq: Once | INTRAVENOUS | Status: AC
  Filled 2019-12-16: qty 5

## 2019-12-16 MED ORDER — PALONOSETRON HCL INJECTION 0.25 MG/5ML
0.2500 mg | Freq: Once | INTRAVENOUS | Status: AC
  Administered 2019-12-16: 0.25 mg via INTRAVENOUS
  Filled 2019-12-16: qty 5

## 2019-12-16 MED ORDER — BORTEZOMIB CHEMO SQ INJECTION 3.5 MG (2.5MG/ML)
1.5000 mg/m2 | Freq: Once | INTRAMUSCULAR | Status: AC
  Filled 2019-12-16: qty 1.3

## 2019-12-16 MED ORDER — BORTEZOMIB CHEMO SQ INJECTION 3.5 MG (2.5MG/ML)
1.5000 mg/m2 | Freq: Once | INTRAMUSCULAR | Status: AC
  Administered 2019-12-16: 3.25 mg via SUBCUTANEOUS
  Filled 2019-12-16: qty 1.3

## 2019-12-16 NOTE — Progress Notes (Signed)
Patient is currently followed by [facility] at home. TOC Becky Dupree made aware.  Flo Shanks BSN, RN, [place] [phone]

## 2019-12-16 NOTE — Progress Notes (Signed)
[facility] Progress Note  Patient: Regina Harris    HDQ:[id number]  DOA: 12/15/2019     Date of Service: the patient was seen and examined on 12/16/2019  Chief complaint. Multiple myeloma.  Brief hospital course: Regina Harris is a 84 y.o. female with medical history significant for retroperitoneal neoplasm highly suspected to be kappa chain myeloma, hypertension, hyperlipidemia, and hypothyroidism who is transferred to [facility] hospital from [facility] for initiation of chemotherapy.  Patient has been hospitalized at [facility] from 12/11/2019-12/15/2019 for acute renal failure due to obstructive uropathy with bilateral hydronephrosis.  She was also noted to have moderate to large left-sided pleural effusion and underwent ultrasound-guided thoracentesis on 12/12/2019 with removal of 650 mL of pleural fluid.  Cytology showed presence of malignant cells.  On 12/13/2019 she had a left-sided percutaneous nephrostomy tube placed by interventional radiology.  Prior to transfer to [facility] on 12/15/2019 she underwent a bone marrow biopsy with pathology still pending.  She was also begun on treatment of UTI with amoxicillin x7 days for 40,000 colonies Enterococcus faecalis seen on urine culture.  Patient currently has no complaints other than continued back and lower extremity pain.  Her granddaughter, Regina Harris, is at bedside.  They have had thorough discussions regarding her plan of care.  They are agreeable to try chemotherapy with primary goals focusing on improvement in performance status.  If not having significant improvement in performance status/quality of life, they anticipate returning to home with palliative/hospice services.  Currently further plan is to treat multiple myeloma.  Assessment and Plan: Kappa light chain myeloma/retroperitoneal malignancy: IR guided bone marrow biopsy 12/15/2019 is pending.  Oncology are following and plan for initial inpatient chemotherapy with  Velcade, Cytoxan, dexamethasone. -Appreciate oncology assistance -Continue pain control as needed  Acute kidney injury: In setting of myeloma kidney and obstructive uropathy.  S/p left percutaneous nephrostomy tube placed on 12/13/2019. -Continues to have good output from nephrostomy -Creatinine remains elevated but stable, continue to monitor -Lasix has been discontinued  Left-sided pleural effusion: S/p ultrasound-guided thoracentesis on 12/12/2019 with removal of 650 mL of pleural fluid.  Cytology showed presence of malignant cells.  Currently stable and saturating well on room air.  UTI: Urine culture collected 12/12/2019 grew 40,000 colonies Enterococcus faecalis.  She has been started on amoxicillin which we will continue to complete 7 days.  Hypertension: BP mildly elevated in setting of pain.  Continue to monitor, can use IV labetalol as needed.  Avoid addition of ACE/ARB/thiazide in setting of renal dysfunction.  Hypothyroidism: Continue Synthroid.  Hyperlipidemia: Continue atorvastatin.  Generalized weakness/deconditioning: Request PT/OT evaluation.  Mild cognitive impairment: Continue home donepezil.  Diet: Regular diet DVT Prophylaxis: Subcutaneous Heparin    Advance goals of care discussion: DNR  Family Communication: family was present at bedside, at the time of interview.  The pt provided permission to discuss medical plan with the family. Opportunity was given to ask question and all questions were answered satisfactorily.   Disposition:  Pt is from home, admitted with chemotherapy for multiple myeloma precludes a safe discharge. Discharge to SNF, when medically stable.  Subjective: Denies any acute complaint no nausea no vomiting.  Continues to have pain in her legs which is chronic.  No fever no chills.  Physical Exam: General:  alert oriented to time and place.  Appear in moderate distress, affect flat in affect Eyes: PERRL ENT: Oral Mucosa Clear,  dry  Neck: difficult to assess  JVD,  Cardiovascular: S1 and S2 Present,  no Murmur,  Respiratory: increased respiratory effort, Bilateral Air entry equal and Decreased, basal Crackles, no wheezes Abdomen: Bowel Sound present, Soft and no tenderness,  Skin: no rash Extremities: trace Pedal edema, no calf tenderness Neurologic: without any new focal findings  Gait not checked due to patient safety concerns  Vitals:   12/16/19 0035 12/16/19 0500 12/16/19 0743 12/16/19 1538  BP: (!) 158/72  137/71 133/79  Pulse: 94  89 88  Resp:   15 20  Temp: 97.9 F (36.6 C)  97.8 F (36.6 C) 98.3 F (36.8 C)  TempSrc: Oral  Oral Oral  SpO2: 97%  95% 95%  Weight:  96.9 kg      Intake/Output Summary (Last 24 hours) at 12/16/2019 1918 Last data filed at 12/16/2019 1610 Gross per 24 hour  Intake 1017.73 ml  Output 750 ml  Net 267.73 ml   Filed Weights   12/16/19 0500  Weight: 96.9 kg    Data Reviewed: I have personally reviewed and interpreted daily labs, tele strips, imagings as discussed above. I reviewed all nursing notes, pharmacy notes, vitals, pertinent old records I have discussed plan of care as described above with RN and patient/family.  CBC: Recent Labs  Lab 12/12/19 0048 12/13/19 0633 12/14/19 0541 12/15/19 0630 12/16/19 0249  WBC 5.4 4.0 4.9 4.4 5.0  NEUTROABS 3.9 2.4 3.0 3.0 3.5  HGB 13.1 11.5* 12.3 13.0 12.5  HCT 42.0 36.2 38.9 39.6 38.3  MCV 98.8 97.6 97.3 94.1 93.2  PLT 170 131* 150 151 025*   Basic Metabolic Panel: Recent Labs  Lab 12/12/19 0048 12/13/19 0633 12/14/19 0541 12/15/19 0630 12/16/19 0249  NA 143 142 143 142 141  K 4.7 4.2 4.0 3.5 3.5  CL 113* 113* 112* 107 108  CO2 16* 17* 18* 22 22  GLUCOSE 93 101* 94 94 92  BUN 32* 32* 33* 30* 30*  CREATININE 2.91* 3.38* 3.47* 3.24* 2.52*  CALCIUM 9.4 8.5* 8.4* 8.9 8.5*    Studies: No results found.  Scheduled Meds: . amoxicillin  250 mg Oral Q12H  . aspirin EC  81 mg Oral Daily  . atorvastatin   10 mg Oral Daily  . donepezil  5 mg Oral QHS  . enoxaparin (LOVENOX) injection  30 mg Subcutaneous Q24H  . gabapentin  400 mg Oral BID  . levothyroxine  75 mcg Oral Q breakfast  . megestrol  40 mg Oral Daily  . pantoprazole  40 mg Oral Daily  . sodium chloride flush  3 mL Intravenous Q12H   Continuous Infusions: . sodium chloride    . ondansetron (ZOFRAN) IV     PRN Meds: sodium chloride, acetaminophen, alum & mag hydroxide-simeth, guaiFENesin-dextromethorphan, hydrocortisone, labetalol, methocarbamol, ondansetron **OR** ondansetron **OR** ondansetron (ZOFRAN) IV **OR** ondansetron (ZOFRAN) IV, oxyCODONE, senna-docusate, sodium chloride flush, traMADol  Time spent: 35 minutes  Author: Berle Mull, MD [facility] 12/16/2019 7:18 PM  To reach On-call, see care teams to locate the attending and reach out to them via [email or web address]. If 7PM-7AM, please contact night-coverage If you still have difficulty reaching the attending provider, please page the [facility] (Director on Call) for [facility] on amion for assistance.

## 2019-12-16 NOTE — TOC Initial Note (Signed)
Transition of Care [facility]) - Initial/Assessment Note    Patient Details  Name: Regina Harris MRN: [id number] Date of Birth: 02-May-1936  Transition of Care [facility] CM/SW Contact:    Elease Hashimoto, LCSW Phone Number: 12/16/2019, 2:06 PM  Clinical Narrative:  Spoke with daughter who is here with Mom. Either she or her daughter-pt's granddaughter are here with pt. Her first chemo treatment is today and her treatment plan will be dependent upon how she does with this. She has 24/7 caregiver-Tina at home and her daughter and granddaughter are involved as well. She is active with [facility] and Palliative Care follows at home. She has all her equipment at home. Will continue to follow and see if any discharge needs.            Expected Discharge Plan: [place] Barriers to Discharge: Continued Medical Work up   Patient Goals and CMS Choice Patient states their goals for this hospitalization and ongoing recovery are:: Hope this chemo works, then go from there      Expected Discharge Plan and Services Expected Discharge Plan: [place] In-house Referral: Clinical Social Work     Living arrangements for the past 2 months: Single Family Home                                      Prior Living Arrangements/Services Living arrangements for the past 2 months: Single Family Home Lives with:: Self, Other (Comment)(24/7 caregiver)          Need for Family Participation in Patient Care: Yes (Comment) Care giver support system in place?: Yes (comment) Current home services: DME, Other (comment), Home PT, Home RN(Palliative Care following)    Activities of Daily Living Home Assistive Devices/Equipment: Dentures (specify type), Walker (specify type) ADL Screening (condition at time of admission) Patient's cognitive ability adequate to safely complete daily activities?: No Is the patient deaf or have difficulty hearing?: No Does the patient have difficulty  seeing, even when wearing glasses/contacts?: No Does the patient have difficulty concentrating, remembering, or making decisions?: No Patient able to express need for assistance with ADLs?: Yes Does the patient have difficulty dressing or bathing?: Yes Independently performs ADLs?: No Communication: Independent Dressing (OT): Needs assistance Is this a change from baseline?: Pre-admission baseline Grooming: Needs assistance Is this a change from baseline?: Pre-admission baseline Feeding: Needs assistance Is this a change from baseline?: Change from baseline, expected to last <3 days Bathing: Needs assistance Is this a change from baseline?: Pre-admission baseline Toileting: Needs assistance Is this a change from baseline?: Pre-admission baseline In/Out Bed: Needs assistance Is this a change from baseline?: Pre-admission baseline Walks in Home: Independent with device (comment) Does the patient have difficulty walking or climbing stairs?: Yes Weakness of Legs: Both Weakness of Arms/Hands: Both  Permission Sought/Granted                  Emotional Assessment Appearance:: Appears stated age Attitude/Demeanor/Rapport: Gracious Affect (typically observed): Adaptable, Calm Orientation: : Oriented to Self, Oriented to Place, Oriented to Situation      Admission diagnosis:  Kappa light chain myeloma ([place]) [C90.00] Patient Active Problem List   Diagnosis Date Noted  . Kappa light chain myeloma ([place]) 12/15/2019  . Goals of care, counseling/discussion 12/15/2019  . IgG multiple myeloma ([place])   . Laceration of left foot   . Palliative care encounter   .  Bilateral hydronephrosis 12/12/2019  . Retroperitoneal mass 12/04/2019  . AKI (acute kidney injury) ([place]) 11/17/2019  . Acute metabolic encephalopathy 03/49/6116  . Acute lower UTI 07/29/2019  . Pneumonia due to COVID-19 virus 07/29/2019  . COVID-19 virus infection 07/29/2019  . UTI (urinary tract infection) 07/27/2019  .  Nasal congestion 07/27/2019  . B12 deficiency 01/05/2019  . Memory loss 12/30/2018  . Poor balance 12/30/2018  . Low back pain 10/14/2018  . Fatigue 10/01/2018  . Abnormal urinalysis 10/01/2018  . 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 Right leg swelling 06/09/2018  . Right knee pain 06/09/2018  . Baker's cyst of knee, right 06/09/2018  . Encounter for screening mammogram for breast cancer 02/27/2017  . Estrogen deficiency 02/27/2017  . Foot pain, bilateral 02/27/2017  . Hypothyroidism 04/16/2016  . Pedal edema 04/05/2016  . Right rotator cuff tear 03/09/2016  . Hypokalemia 03/01/2016  . Nodule of chest wall 10/06/2014  . Left breast mass 06/30/2014  . Encounter for Medicare annual wellness exam 02/22/2014  . Polycythemia, secondary 02/22/2014  . GERD (gastroesophageal reflux disease) 08/24/2013  . Lump or mass in breast 05/06/2013  . Post-menopausal 06/24/2012  . Obesity 12/24/2011  . Prediabetes 03/12/2011  . NEOPLASM UNSPECIFIED NATURE DIGESTIVE SYSTEM 07/24/2010  . LUNG NODULE 01/16/2010  . HYPERCHOLESTEROLEMIA, PURE 07/10/2007  . Former smoker 03/19/2007  . Essential hypertension 03/19/2007  . Carotid bruit 03/19/2007  . COLONIC POLYPS, HX OF 03/19/2007   PCP:  Abner Greenspan, MD Pharmacy:   [facility], [address] [place] [address] Phone: [phone] Fax: [phone]     Social Determinants of Health (SDOH) Interventions    Readmission Risk Interventions No flowsheet data found.

## 2019-12-16 NOTE — Evaluation (Signed)
Occupational Therapy Evaluation Patient Details Name: Regina Harris MRN: [id number] DOB: 1935/12/05 Today's Date: 12/16/2019    History of Present Illness 84 y/o F with PMH: HTN, HLD, and hypothyroidism who was transferred to [facility] for chemo after stay at [facility] 2/5-12/15/19 for acute renal failure d/t obstructive uropathy with hydronephrosis. Pt found to have moderate L pleural effusion, s/p thoracentesis 2/6. Malignant cells found on cytology of fluids. Pt is also now s/p percutaneous nephrostomy tube. Bone marrow bx pending.   Clinical Impression   Pt was seen for OT evaluation this date. Prior to hospital admission, pt had PCA available 12hrs during daytime and sitter 12hrs during night time for safety, assistance with bathing, and meal preparation. Pt lives in [facility] alone, but with family close who are very involved in care. Currently pt demonstrates impairments as described below (See OT problem list) which functionally limit her ability to perform ADL/self-care tasks. Pt currently requires MOD/MAX A arm in arm to perform ADL transfers-sit<>stand-MAX to TOTAL A for LB ADLs including bathing, dressing and toileting, is able to perform some UB ADLs including grooming while bed level with HOB elevated and setup, and requires MOD A for static sitting balance for other aspects of UB self care such as UB dressing.  Pt would benefit from skilled OT to address noted impairments and functional limitations (see below for any additional details) in order to maximize safety and independence while minimizing falls risk and caregiver burden. Upon hospital discharge, recommend SNF to maximize pt safety and return to PLOF.     Follow Up Recommendations  SNF    Equipment Recommendations  Other (comment)    Recommendations for Other Services       Precautions / Restrictions Precautions Precautions: Fall Precaution Comments: L nephrostomy tube Restrictions Weight Bearing Restrictions: No      Mobility Bed  Mobility Overal bed mobility: Needs Assistance Bed Mobility: Rolling;Supine to Sit;Sit to Supine Rolling: Mod assist;+2 for physical assistance;+2 for safety/equipment   Supine to sit: +2 for physical assistance;+2 for safety/equipment;Max assist Sit to supine: Max assist;+2 for physical assistance;+2 for safety/equipment      Transfers Overall transfer level: Needs assistance Equipment used: 2 person hand held assist Transfers: Sit to/from Stand Sit to Stand: Mod assist;Max assist;+2 physical assistance;From elevated surface         General transfer comment: Pt tolerates ~10 secs standing, wt shift attempted-pt with minimal tolerance and R/posterior lean, plan was for SPS to recliner adjacent to bed, not compelted on OT evaluation this date d/t poor balance    Balance Overall balance assessment: Needs assistance Sitting-balance support: Feet supported;Bilateral upper extremity supported Sitting balance-Leahy Scale: Poor Sitting balance - Comments: requires MOD A to maintain static sitting balance   Standing balance support: Bilateral upper extremity supported Standing balance-Leahy Scale: Poor Standing balance comment: requires MOD/MAX A arm in arm x2 person to maintain static standing                           ADL either performed or assessed with clinical judgement   ADL Overall ADL's : Needs assistance/impaired Eating/Feeding: Set up;Supervision/ safety;Bed level   Grooming: Wash/dry face;Set up;Bed level Grooming Details (indicate cue type and reason): with HOB elevated Upper Body Bathing: Moderate assistance;Maximal assistance;Sitting Upper Body Bathing Details (indicate cue type and reason): with MOD A for seated support in addition to actual assistance for fxl task. Lower Body Bathing: Maximal assistance;Bed level   Upper Body Dressing :  Moderate assistance;Maximal assistance;Sitting Upper Body Dressing Details (indicate cue type and reason): MOD A for  seated balance support, MAX A for fxl task Lower Body Dressing: Total assistance     Toilet Transfer Details (indicate cue type and reason): unable, required 2p MOD/MAX A arm in arm assist from EOB, some shuffling steps attempted, but pt with very poor balance and tolerance for standing Toileting- Clothing Manipulation and Hygiene: Total assistance;Bed level               Vision Baseline Vision/History: Wears glasses Patient Visual Report: No change from baseline       Perception     Praxis      Pertinent Vitals/Pain Pain Assessment: Faces Faces Pain Scale: Hurts little more Pain Location: L flank Pain Descriptors / Indicators: Sore Pain Intervention(s): Limited activity within patient's tolerance;Monitored during session;Repositioned     Hand Dominance Left   Extremity/Trunk Assessment Upper Extremity Assessment Upper Extremity Assessment: Generalized weakness;RUE deficits/detail;LUE deficits/detail RUE Deficits / Details: hx rotator cuff issues, 3/4 shoulder arc of motion, but with crepidus, elbow, grip MMT 3+/5 LUE Deficits / Details: shld, elbow, grip 4-/5       Cervical / Trunk Assessment Cervical / Trunk Assessment: Kyphotic   Communication Communication Communication: HOH   Cognition Arousal/Alertness: Awake/alert Behavior During Therapy: Flat affect Overall Cognitive Status: History of cognitive impairments - at baseline                                 General Comments: Daughter-Carolyn, present on eval, asssists with PLOF/home setup information. Pt is relatively awake while sitting up in bed, Some instance of drwsiness/eye closing, requiring MOD verbal/tactile cues to attend while in EOB sitting.   General Comments       Exercises Other Exercises Other Exercises: OT facilitates education with pt and family member present-Daughter, Hoyle Sauer- potential plans for d/c including rehab versus [place]. Family wants to take pt home. Agreeable to  further discussion/considerations. Other Exercises: OT facilitates education with pt and dtr re: safety in acute setting including fall prevention considerations-notified of fall alarm on bed as well as encouraging call light use for assistance. Both parties agreeable.   Shoulder Instructions      Home Living Family/patient expects to be discharged to:: Private residence Living Arrangements: Alone Available Help at Discharge: Other (Comment)(PCA 12hrs/day, sitter 12hrs/night, family assists on weekends.) Type of Home: House Home Access: Stairs to enter [employer] of Steps: 1 step with B railings   Home Layout: One level     Bathroom Shower/Tub: Teacher, early years/pre: Standard     Home Equipment: Environmental consultant - 4 wheels;Toilet riser;Tub bench          Prior Functioning/Environment Level of Independence: Needs assistance  Gait / Transfers Assistance Needed: Ambulatory with rollator up until most recent 2-3 weeks d/t difficulty with B LE swelling ADL's / Homemaking Assistance Needed: sitter/PCAs assist with bathing and preparing meals. Family either picks up or gets groceries delivered. Daughter-Carolyn typically transports pt when needed, reports pt able to take herself to restroom.            OT Problem List: Decreased strength;Impaired balance (sitting and/or standing);Decreased cognition;Pain;Decreased activity tolerance;Decreased coordination;Decreased knowledge of use of DME or AE      OT Treatment/Interventions: Self-care/ADL training;DME and/or AE instruction;Therapeutic activities;Balance training;Therapeutic exercise;Patient/family education    OT Goals(Current goals can be found in the care plan section) Acute Rehab  OT Goals Patient Stated Goal: go home OT Goal Formulation: With patient/family Time For Goal Achievement: 12/28/19 Potential to Achieve Goals: Fair ADL Goals Pt Will Perform Eating: with set-up;sitting Pt Will Perform Grooming:  sitting;with min guard assist(EOB to complete 2-3 g/h tasks to improve static sitting tolerance) Pt Will Perform Upper Body Dressing: with min assist;with mod assist;sitting Pt Will Transfer to Toilet: with mod assist;with +2 assist;stand pivot transfer;bedside commode  OT Frequency: Min 2X/week   Barriers to D/C:            Co-evaluation              AM-PAC OT "6 Clicks" Daily Activity     Outcome Measure Help from another person eating meals?: A Little Help from another person taking care of personal grooming?: A Little Help from another person toileting, which includes using toliet, bedpan, or urinal?: Total Help from another person bathing (including washing, rinsing, drying)?: A Lot Help from another person to put on and taking off regular upper body clothing?: A Lot Help from another person to put on and taking off regular lower body clothing?: Total 6 Click Score: 12   End of Session Equipment Utilized During Treatment: Gait belt  Activity Tolerance: Patient limited by fatigue Patient left: in bed;with call bell/phone within reach;with family/visitor present;with bed alarm set  OT Visit Diagnosis: Unsteadiness on feet (R26.81);Other abnormalities of gait and mobility (R26.89);Muscle weakness (generalized) (M62.81);History of falling (Z91.81);Pain;Other symptoms and signs involving cognitive function Pain - Right/Left: Left Pain - part of body: (flank)                Time: 0511-0211 OT Time Calculation (min): 47 min Charges:  OT General Charges $OT Visit: 1 Visit OT Evaluation $OT Eval Moderate Complexity: 1 Mod OT Treatments $Self Care/Home Management : 8-22 mins $Therapeutic Activity: 8-22 mins  Gerrianne Scale, MS, OTR/L ascom [phone] 12/16/19, 11:49 AM

## 2019-12-16 NOTE — Consult Note (Signed)
[place]  [phone] [phone]   Name: LEONELA KIVI Date: 12/16/2019 MRN: [id number]  DOB: 03-Aug-1936  Patient Care Team: Abner Greenspan, MD as PCP - General Byrnett, Forest Gleason, MD (General Surgery) Tobi Bastos, RN as [facility]    REASON FOR CONSULTATION: Regina Harris is a 84 y.o. 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  Patient was hospitalized at [facility] 12/11/2019-12/15/2019 for acute renal failure due to obstructive uropathy with bilateral hydronephrosis.  Patient is status post left-sided percutaneous nephrostomy tube.  She also underwent diagnostic thoracentesis with 650 mL of pleural fluid removed and cytology positive for malignant cells.  Patient was treated for UTI.  Patient was transferred to [facility] from [facility] hospital on 12/15/2019 to receive CyBorD chemotherapy.  Palliative care was consulted to help address goals and manage ongoing symptoms.  SOCIAL HISTORY:     reports that she has quit smoking. Her smoking use included cigarettes. She smoked 1.00 pack per day. She has never used smokeless tobacco. She reports that she does not drink alcohol or use drugs.   Patient is widowed for the past 3 years.  Patient lives at home with a caregiver.  She has a daughter and granddaughter who are involved in her care.  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   ADVANCE DIRECTIVES:  On file  CODE STATUS: DNR  PAST MEDICAL HISTORY: Past Medical History:  Diagnosis Date  . Barrett esophagus   . Carotid bruit   . Diabetes mellitus without complication ([place])   . Esophagitis   . Gastritis 2013  . GERD (gastroesophageal reflux disease)   . HH (hiatus hernia)   . History of colonic polyps   . History of repair of right rotator cuff   . Hyperlipidemia     . Hypertension   . Hyperthyroidism 06/09/2018   Per [facility] RAI Therapy for Hyperthyroidism order  . Lump or mass in breast   . Pneumonia   . Polycythemia, secondary   . Tobacco abuse     PAST SURGICAL HISTORY:  Past Surgical History:  Procedure Laterality Date  . ABDOMINAL HYSTERECTOMY    . APPENDECTOMY    . BREAST BIOPSY Right 1996  . BREAST BIOPSY Left 10/09/2012   Benign breast tissue with focal fat necrosis and focal periductal chronic inflammation.  Marland Kitchen BREAST BIOPSY Left 10/09/2012   Benign breast tissue with focal fat necrosis and focal periductal chronic inflammation.  Marland Kitchen CATARACT EXTRACTION  3/11   Dr Charise Killian  . CHOLECYSTECTOMY    . COLONOSCOPY  2009  . CYSTOSCOPY WITH URETEROSCOPY AND STENT PLACEMENT Left 11/19/2019   Procedure: CYSTOSCOPY WITH URETEROSCOPY AND STENT PLACEMENT;  Surgeon: Billey Co, MD;  Location: [facility] ORS;  Service: Urology;  Laterality: Left;  . ESOPHAGOGASTRODUODENOSCOPY (EGD) WITH PROPOFOL N/A 11/02/2015   Procedure: ESOPHAGOGASTRODUODENOSCOPY (EGD) WITH PROPOFOL;  Surgeon: Manya Silvas, MD;  Location: [facility] ENDOSCOPY;  Service: Endoscopy;  Laterality: N/A;  . IR NEPHROSTOMY PLACEMENT LEFT  12/13/2019  . SHOULDER ARTHROSCOPY WITH OPEN ROTATOR CUFF REPAIR Right 03/05/2016   Procedure: SHOULDER ARTHROSCOPY WITH OPEN ROTATOR CUFF REPAIR;  Surgeon: Earnestine Leys, MD;  Location: [facility] ORS;  Service: Orthopedics;  Laterality: Right;  . UPPER GI ENDOSCOPY  2013    HEMATOLOGY/ONCOLOGY HISTORY:  Oncology History  Kappa light chain myeloma ([place])  12/15/2019  Initial Diagnosis   Kappa light chain myeloma ([place])   12/16/2019 -  Chemotherapy   The patient had palonosetron (ALOXI) injection 0.25 mg, 0.25 mg, Intravenous,  Once, 1 of 8 cycles bortezomib SQ (VELCADE) chemo injection 3.25 mg, 1.5 mg/m2 = 3.25 mg, Subcutaneous,  Once, 1 of 8 cycles cyclophosphamide (CYTOXAN) 500 mg in sodium chloride 0.9 % 250 mL chemo infusion, 232 mg/m2 = 480 mg (100 % of original dose  225 mg/m2), Intravenous,  Once, 1 of 8 cycles Dose modification: 225 mg/m2 (original dose 225 mg/m2, Cycle 1, Reason: Change in SCr/CrCl)  for chemotherapy treatment.      ALLERGIES:  is allergic to benadryl [diphenhydramine].  MEDICATIONS:  Current Facility-Administered Medications  Medication Dose Route Frequency Provider Last Rate Last Admin  . 0.9 %  sodium chloride infusion  250 mL Intravenous PRN Lloyd Huger, MD      . acetaminophen (TYLENOL) tablet 650 mg  650 mg Oral Q4H PRN Lloyd Huger, MD   650 mg at 12/16/19 0039  . alum & mag hydroxide-simeth (MAALOX/MYLANTA) 200-200-20 MG/5ML suspension 60 mL  60 mL Oral Q4H PRN Lloyd Huger, MD      . amoxicillin (AMOXIL) capsule 250 mg  250 mg Oral Q12H Lloyd Huger, MD   250 mg at 12/16/19 0926  . aspirin EC tablet 81 mg  81 mg Oral Daily Lloyd Huger, MD   81 mg at 12/16/19 7062  . atorvastatin (LIPITOR) tablet 10 mg  10 mg Oral Daily Lloyd Huger, MD   10 mg at 12/16/19 3762  . donepezil (ARICEPT) tablet 5 mg  5 mg Oral QHS Lloyd Huger, MD   5 mg at 12/15/19 2132  . enoxaparin (LOVENOX) injection 30 mg  30 mg Subcutaneous Q24H Zada Finders R, MD   30 mg at 12/15/19 2003  . gabapentin (NEURONTIN) capsule 400 mg  400 mg Oral BID Lloyd Huger, MD   400 mg at 12/16/19 8315  . guaiFENesin-dextromethorphan (ROBITUSSIN DM) 100-10 MG/5ML syrup 10 mL  10 mL Oral Q4H PRN Lloyd Huger, MD      . hydrocortisone (ANUSOL-HC) 2.5 % rectal cream 1 application  1 application Rectal BID PRN Lloyd Huger, MD      . labetalol (NORMODYNE) injection 5 mg  5 mg Intravenous Q4H PRN Lenore Cordia, MD      . levothyroxine (SYNTHROID) tablet 75 mcg  75 mcg Oral Q breakfast Lloyd Huger, MD   75 mcg at 12/16/19 0536  . megestrol (MEGACE) tablet 40 mg  40 mg Oral Daily Lloyd Huger, MD   40 mg at 12/16/19 0926  . methocarbamol (ROBAXIN) tablet 500 mg  500 mg Oral Q8H PRN Lavina Hamman, MD      . ondansetron [facility]) tablet 4-8 mg  4-8 mg Oral Q8H PRN Lloyd Huger, MD       Or  . ondansetron (ZOFRAN-ODT) disintegrating tablet 4-8 mg  4-8 mg Oral Q8H PRN Lloyd Huger, MD       Or  . ondansetron (ZOFRAN) injection 4 mg  4 mg Intravenous Q8H PRN Lloyd Huger, MD       Or  . ondansetron (ZOFRAN) 8 mg in sodium chloride 0.9 % 50 mL IVPB  8 mg Intravenous Q8H PRN Lloyd Huger, MD      . oxyCODONE (Oxy IR/ROXICODONE) immediate release tablet 5 mg  5 mg Oral Q4H PRN Lang Snow, NP  5 mg at 12/16/19 0536  . pantoprazole (PROTONIX) EC tablet 40 mg  40 mg Oral Daily Lloyd Huger, MD   40 mg at 12/16/19 1610  . senna-docusate (Senokot-S) tablet 1 tablet  1 tablet Oral QHS PRN Lloyd Huger, MD      . sodium chloride flush (NS) 0.9 % injection 3 mL  3 mL Intravenous Q12H Lloyd Huger, MD   3 mL at 12/16/19 0938  . sodium chloride flush (NS) 0.9 % injection 3 mL  3 mL Intravenous PRN Lloyd Huger, MD      . traMADol Veatrice Bourbon) tablet 50 mg  50 mg Oral Q8H PRN Lloyd Huger, MD   50 mg at 12/16/19 1203   Facility-Administered Medications Ordered in Other Encounters  Medication Dose Route Frequency Provider Last Rate Last Admin  . bortezomib SQ (VELCADE) chemo injection 3.25 mg  1.5 mg/m2 Subcutaneous Once Lloyd Huger, MD      . cyclophosphamide (CYTOXAN) 500 mg in sodium chloride 0.9 % 250 mL chemo infusion  232 mg/m2 Intravenous Once Lloyd Huger, MD      . dexamethasone (DECADRON) 20 mg in sodium chloride 0.9 % 50 mL IVPB  20 mg Intravenous Once Lloyd Huger, MD      . palonosetron (ALOXI) injection 0.25 mg  0.25 mg Intravenous Once Lloyd Huger, MD        VITAL SIGNS: BP 133/79 (BP Location: Right Arm)   Pulse 88   Temp 98.3 F (36.8 C) (Oral)   Resp 20   Wt 213 lb 10 oz (96.9 kg)   SpO2 95%   BMI 33.46 kg/m  Filed Weights   12/16/19 0500  Weight: 213 lb 10 oz (96.9  kg)    Estimated body mass index is 33.46 kg/m as calculated from the following:   Height as of 12/12/19: 5' 7"  (1.702 m).   Weight as of this encounter: 213 lb 10 oz (96.9 kg).  LABS: CBC:    Component Value Date/Time   WBC 5.0 12/16/2019 0249   HGB 12.5 12/16/2019 0249   HCT 38.3 12/16/2019 0249   PLT 148 (L) 12/16/2019 0249   MCV 93.2 12/16/2019 0249   NEUTROABS 3.5 12/16/2019 0249   LYMPHSABS 0.7 12/16/2019 0249   MONOABS 0.8 12/16/2019 0249   EOSABS 0.1 12/16/2019 0249   BASOSABS 0.0 12/16/2019 0249   Comprehensive Metabolic Panel:    Component Value Date/Time   NA 141 12/16/2019 0249   K 3.5 12/16/2019 0249   CL 108 12/16/2019 0249   CO2 22 12/16/2019 0249   BUN 30 (H) 12/16/2019 0249   CREATININE 2.52 (H) 12/16/2019 0249   GLUCOSE 92 12/16/2019 0249   CALCIUM 8.5 (L) 12/16/2019 0249   AST 12 (L) 12/16/2019 0249   ALT 9 12/16/2019 0249   ALKPHOS 43 12/16/2019 0249   BILITOT 0.6 12/16/2019 0249   PROT 6.9 12/16/2019 0249   ALBUMIN 2.3 (L) 12/16/2019 0249    RADIOGRAPHIC STUDIES: CT ABDOMEN PELVIS WO CONTRAST  Result Date: 11/17/2019 CLINICAL DATA:  84 year old female with left-sided abdominal pain. Concern for acute diverticulitis. EXAM: CT ABDOMEN AND PELVIS WITHOUT CONTRAST TECHNIQUE: Multidetector CT imaging of the abdomen and pelvis was performed following the standard protocol without IV contrast. COMPARISON:  CT abdomen pelvis dated 06/13/2006. FINDINGS: Evaluation of this exam is limited in the absence of intravenous contrast. Lower chest: Trace left pleural effusion. There is thickened appearance of the posteromedial lower lobe pleural surfaces. Partially visualized  infiltrative mass encasing the distal descending thoracic aorta and abutting the lower thoracic spine at T10-T11. This may represent an infiltrative neoplasm, lymphoma, or fibrosis versus sequela osteomyelitis/discitis. This is new since the CT of 07/17/2010. Further evaluation with MRI without and  with contrast is recommended. Bibasilar subpleural linear and streaky densities may represent atelectasis/scarring. Infiltrate is less likely. Clinical correlation is recommended. There is no intra-abdominal free air or free fluid. Hepatobiliary: The liver is unremarkable. No intrahepatic biliary ductal dilatation. Cholecystectomy. No retained calcified stone noted in the central CBD. Pancreas: There is mild haziness of the distal pancreas which may represent acute pancreatitis. Correlation with pancreatic enzymes recommended. No drainable fluid collection/abscess or pseudocyst Spleen: Normal in size without focal abnormality. Adrenals/Urinary Tract: There is a 2 cm left adrenal adenoma. The right adrenal gland is unremarkable. There is mild left hydronephrosis. No calcified stone identified. There is haziness and stranding of the proximal left periureteric fat which may represent an infection/inflammatory process or fibrosis and possible degree of ureteral stricture. There is a 2 cm left renal inferior pole hypodense lesion which is not characterized but demonstrates fluid attenuation most consistent with a cyst. There is no hydronephrosis or nephrolithiasis on the right. Multiple right renal cysts measure up to approximately 7 cm. The right ureter and urinary bladder appear unremarkable. Stomach/Bowel: There is sigmoid diverticulosis with muscular hypertrophy. Mild perisigmoid stranding and haziness may represent mild acute diverticulitis. Clinical correlation is recommended. There is diffuse thickened appearance of the colon which may be related to underdistention. Diffuse submucosal fat deposit along the colonic wall likely sequela of chronic inflammatory process. There is no bowel obstruction. Appendectomy. Vascular/Lymphatic: Advanced aortoiliac atherosclerotic disease. There is stranding of the periaortic fat. There is a 3.8 x 2.1 cm left para-aortic/paraspinal soft tissue lesion which is not characterized  but may represent enlarged lymph node (series 2 image 38). However, this soft tissue appears to abut the left L4 vertebra and therefore a neoplasm or an infectious process is not excluded. No portal venous gas. The IVC is unremarkable. Reproductive: Hysterectomy. Other: None Musculoskeletal: Osteopenia with extensive multilevel degenerative changes, disc desiccation and vacuum phenomena. Old-appearing multilevel compression fractures most prominent involving L3 and L4. No definite acute fracture. No retropulsed fragment. IMPRESSION: 1. Sigmoid diverticulosis with findings concerning for mild diverticulitis. No diverticular abscess or perforation. 2. No bowel obstruction. 3. Mild left hydronephrosis, possibly secondary to a degree of stricture or decreased peristalsis of the left ureter due to adjacent inflammatory process or fibrosis. No stone. 4. Mild stranding of the distal pancreas. Correlation with pancreatic enzymes recommended to evaluate for acute pancreatitis. No fluid collection. 5. Infiltrative soft tissue mass along the lower thoracic spine at T10-T11 with encasement of the aorta. This may represent an inflammatory/infectious process (osteomyelitis/discitis) or an infiltrative neoplasm. Further evaluation with MRI without and with contrast is recommended. 6. Thickened appearance of the inferior medial lower lobe pleural surfaces which may be secondary to infiltration of paraspinal/periaortic mass or sequela of chronic inflammation. 7. Left para-aortic soft tissue density to the left of L4 may represent adenopathy or a neoplasm. Electronically Signed   By: Anner Crete M.D.   On: 11/17/2019 18:09   DG Chest 1 View  Result Date: 12/12/2019 CLINICAL DATA:  Status post LEFT thoracentesis. EXAM: CHEST  1 VIEW COMPARISON:  12/12/2019 FINDINGS: LEFT pleural effusion has decreased, now trace. LEFT basilar atelectasis has improved. A trace RIGHT pleural effusion may be present. There is no evidence of  pneumothorax. IMPRESSION: Decreased LEFT  pleural effusion, now trace, with improved LEFT basilar atelectasis. No evidence of pneumothorax. Electronically Signed   By: Margarette Canada M.D.   On: 12/12/2019 10:49   DG Knee 2 Views Left  Result Date: 12/12/2019 CLINICAL DATA:  Knee pain EXAM: LEFT KNEE - 1-2 VIEW COMPARISON:  None. FINDINGS: No fracture or dislocation. Tricompartmental osteoarthritis is noted with joint space loss and subchondral sclerosis. No large knee joint effusion. Patellar enthesopathy seen. Vascular calcifications are noted. IMPRESSION: No acute osseous abnormality. Electronically Signed   By: Prudencio Pair M.D.   On: 12/12/2019 03:35   DG Abd 1 View  Result Date: 12/11/2019 CLINICAL DATA:  Ureteral stent. Lower abdominal pain. EXAM: ABDOMEN - 1 VIEW COMPARISON:  November 17, 2019. FINDINGS: The bowel gas pattern is normal. Status post cholecystectomy. Left ureteral stent is noted in grossly good position. Phleboliths are noted in the pelvis. No nephrolithiasis is noted. IMPRESSION: Left ureteral stent is noted in grossly good position. No evidence of bowel obstruction or ileus. Electronically Signed   By: Marijo Conception M.D.   On: 12/11/2019 16:30   CT Head Wo Contrast  Result Date: 12/12/2019 CLINICAL DATA:  Fall weakness EXAM: CT HEAD WITHOUT CONTRAST TECHNIQUE: Contiguous axial images were obtained from the base of the skull through the vertex without intravenous contrast. COMPARISON:  December 07, 2019 brain MRI FINDINGS: Brain: No evidence of acute territorial infarction, hemorrhage, hydrocephalus,extra-axial collection or mass lesion/mass effect. There is dilatation the ventricles and sulci consistent with age-related atrophy. Low-attenuation changes in the deep white matter consistent with small vessel ischemia. Vascular: No hyperdense vessel or unexpected calcification. Skull: The skull is intact. No fracture or focal lesion identified. Sinuses/Orbits: The visualized paranasal  sinuses and mastoid air cells are clear. The orbits and globes intact. Other: Small laceration seen over the posterior left parietal skull. IMPRESSION: No acute intracranial abnormality. Findings consistent with age related atrophy and chronic small vessel ischemia Electronically Signed   By: Prudencio Pair M.D.   On: 12/12/2019 00:45   CT CHEST WO CONTRAST  Result Date: 11/18/2019 CLINICAL DATA:  Adenopathy EXAM: CT CHEST WITHOUT CONTRAST TECHNIQUE: Multidetector CT imaging of the chest was performed following the standard protocol without IV contrast. COMPARISON:  Chest CT 07/17/2010 FINDINGS: Cardiovascular: Normal heart size. No pericardial effusion. Aortic and coronary atherosclerosis. Mediastinum/Nodes: Infiltrative posterior mediastinal mass partially encasing the descending aorta with indistinguishable plane between the mass and the posterior wall of the aorta separate from areas of intimal calcification. The infiltrative abnormality extends approximately 9 cm craniocaudal and 5.6 cm in diameter. The hila and other mediastinal spaces are negative for adenopathy. Lungs/Pleura: Small pleural effusions on both sides. There is subpleural extension of the mass with no visible nodularity along the pleural surface, although limited without contrast. Dependent atelectasis. No worrisome pulmonary nodules. Upper Abdomen: Partial coverage shows left hydronephrosis likely due to the retroperitoneal mass medial to the left psoas on prior CT. Right renal cystic change and atherosclerotic calcification. Cholecystectomy. Musculoskeletal: The posterior mediastinal mass is in close proximity to the spine but does not cause visible erosion or infiltrative lucency. No gross spinal canal mass. No evidence of hematogenous osseous metastases. IMPRESSION: 1. Approximately 9 x 6 cm infiltrative posterior mediastinal mass intimately associated with the descending aorta and thoracic spine. 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 2. Known left  hydronephrosis related to infiltrative mass in the retroperitoneum. 3. Small pleural effusions with lower lobe atelectasis. Electronically Signed   By: Monte Fantasia M.D.   On: 11/18/2019 11:27  CT GUIDED NEEDLE PLACEMENT  Result Date: 11/20/2019 INDICATION: No known primary, now with retroperitoneal mass worrisome for lymphoma. Please perform CT-guided biopsy for tissue diagnostic purposes. EXAM: CT GUIDANCE NEEDLE PLACEMENT COMPARISON:  CT abdomen and pelvis-09/16/2020 MEDICATIONS: None. ANESTHESIA/SEDATION: Fentanyl 50 mcg IV; Versed 1 mg IV Sedation time: 16 minutes; The patient was continuously monitored during the procedure by the interventional radiology nurse under my direct supervision. CONTRAST:  None. COMPLICATIONS: None immediate. PROCEDURE: Informed consent was obtained from the patient following an explanation of the procedure, risks, benefits and alternatives. A time out was performed prior to the initiation of the procedure. The patient was positioned prone on the CT table and a limited CT was performed for procedural planning demonstrating unchanged size and appearance the left-sided retroperitoneal mass, presumed nodal conglomeration with dominant component measuring approximately 4.5 x 2.6 cm (image 21, series 3). The procedure was planned. The operative site was prepped and draped in the usual sterile fashion. Appropriate trajectory was confirmed with a 22 gauge spinal needle after the adjacent tissues were anesthetized with 1% Lidocaine with epinephrine. Under intermittent CT guidance, a 17 gauge coaxial needle was advanced into the peripheral aspect of the mass. Appropriate positioning was confirmed and 6 core needle biopsy samples were obtained with an 18 gauge core needle biopsy device. The co-axial needle was removed following the administration of a Gel-Foam slurry and superficial hemostasis was achieved with manual compression. A limited postprocedural CT was negative for hemorrhage  or additional complication. A dressing was placed. The patient tolerated the procedure well without immediate postprocedural complication. IMPRESSION: Technically successful CT guided core needle biopsy of left-sided retroperitoneal mass, presumed nodal conglomeration. Electronically Signed   By: Sandi Mariscal M.D.   On: 11/20/2019 09:43   MR Brain W Wo Contrast  Result Date: 12/07/2019 CLINICAL DATA:  Lymphoma. EXAM: MRI HEAD WITHOUT AND WITH CONTRAST TECHNIQUE: Multiplanar, multiecho pulse sequences of the brain and surrounding structures were obtained without and with intravenous contrast. CONTRAST:  8m GADAVIST GADOBUTROL 1 MMOL/ML IV SOLN COMPARISON:  Head CT 07/29/2019 FINDINGS: The study is moderately motion degraded. Brain: There is no evidence of acute infarct, intracranial hemorrhage, mass, midline shift, or extra-axial fluid collection. There is mild to moderate cerebral atrophy. T2 hyperintensities in the cerebral white matter bilaterally are nonspecific but compatible with mild chronic small vessel ischemic disease. No enhancing lesions are identified, however note that small lesions could be easily obscured by the degree of motion artifact. Vascular: Major intracranial vascular flow voids are preserved. Skull and upper cervical spine: No gross skull lesion identified. Sinuses/Orbits: Bilateral cataract extraction. Mild right sphenoid sinus mucosal thickening. Small left mastoid effusion. Other: None. IMPRESSION: 1. Motion degraded examination without evidence of intracranial metastases. 2. Mild chronic small vessel ischemic disease. Electronically Signed   By: ALogan BoresM.D.   On: 12/07/2019 15:52   NM Renal Imaging Flow W/O Pharm  Result Date: 12/14/2019 CLINICAL DATA:  Hydronephrosis, abnormal CT, LEFT renal collecting system dilatation with prior stenting, RIGHT hydronephrosis with tumor invasion of RIGHT renal hilum, history lymphoma EXAM: NUCLEAR MEDICINE RENAL SCAN TECHNIQUE: Radionuclide  angiographic and sequential renal images were obtained after intravenous injection of radiopharmaceutical. Lasix not administered due to new acute renal failure. RADIOPHARMACEUTICALS:  5.38 mCi [id number]-[id number] IV COMPARISON:  CT abdomen and pelvis 12/12/2019 FINDINGS: Blood flow: Poor tracer bolus. Probable delayed blood flow to both kidneys though this is suboptimally assessed. LEFT kidney: Delayed uptake, concentration and excretion of tracer by LEFT kidney. Central photopenia  early in exam corresponds to mildly dilated collecting system. Delayed excretion of into the dilated collecting system. Significant retained tracer within the dilated collecting system and the renal cortex at the conclusion of the exam. Analysis of the renogram curve demonstrates a continually increasing curve, with delayed time to peak activity of 42.7 minutes. RIGHT kidney: Delayed and concentration of tracer by RIGHT kidney. The upper and mid portions of the RIGHT kidney demonstrate no renal function, corresponding to marked hydronephrosis and tumor invasion on CT. No significant excretion of tracer into a dilated collecting system is seen over the course of the exam. No ureteral activity identified. Significant retained tracer at the inferior pole the RIGHT kidney at the conclusion of the study. Analysis of the renogram curve demonstrates a continually climbing curved peaking at the conclusion of the exam at 45.7 minutes. Differential renal function: LEFT: 82% RIGHT: 18% IMPRESSION: Absent function at the upper and mid RIGHT kidney corresponding to hydronephrosis and tumor invasion on CT. Markedly impaired BILATERAL renal function with significantly delayed uptake, concentration and excretion of tracer, evidence by continually increasing renogram curves bilaterally. Asymmetric renal function, markedly greater on LEFT than RIGHT. Electronically Signed   By: Lavonia Dana M.D.   On: 12/14/2019 08:41   US RENAL  Result Date:  12/11/2019 CLINICAL DATA:  Retroperitoneal mass on CT, increasing creatinine EXAM: RENAL / URINARY TRACT ULTRASOUND COMPLETE COMPARISON:  CT abdomen and pelvis 11/18/2019 FINDINGS: Right Kidney: Renal measurements: 11.9 x 7.9 x 7.9 cm = volume: 788 mL. Cortical thinning. Normal cortical echogenicity. Mild-to-moderate RIGHT hydronephrosis. Cystic lesion at upper pole 7.2 x 5.9 x 5.7 cm, simple features. Additional peripelvic/mid RIGHT renal cyst 5.7 x 4.3 x 4.0 cm. No solid masses or shadowing calculi. Left Kidney: Renal measurements: 12.1 x 6.3 x 6.9 cm = volume: 273 mL. Cortical thinning. Mild hydronephrosis. Minimally increased cortical echogenicity. Small mass at upper pole 2.8 x 3.2 x 2.3 cm, by prior CT exam appears represent a splenic lobulation rather than a renal lesion. Small exophytic cyst at inferior pole 2.1 x 1.9 x 1.6 cm. No additional renal mass. Bladder: Stent in urinary bladder.  Bladder poorly distended. Other: Small LEFT pleural effusion. IMPRESSION: BILATERAL hydronephrosis, slightly greater on RIGHT. BILATERAL renal cysts larger on RIGHT. Small LEFT pleural effusion. Electronically Signed   By: Lavonia Dana M.D.   On: 12/11/2019 14:48   NM PET Image Initial (PI) Skull Base To Thigh  Result Date: 12/07/2019 CLINICAL DATA:  Initial treatment strategy for lymphoma. EXAM: NUCLEAR MEDICINE PET SKULL BASE TO THIGH TECHNIQUE: 11.8 mCi F-18 FDG was injected intravenously. Full-ring PET imaging was performed from the skull base to thigh after the radiotracer. CT data was obtained and used for attenuation correction and anatomic localization. Fasting blood glucose: 81 mg/dl COMPARISON:  CT chest abdomen and pelvis 11/18/2019 FINDINGS: Mediastinal blood pool activity: SUV max 2.6 Liver activity: SUV max 3.14. NECK: No hypermetabolic lymph nodes in the neck. Incidental CT findings: none CHEST: Paraspinal soft tissue mass within the posterior mediastinum measures 7.3 x 6.9 cm with SUV max of 3.59. Masslike  architectural distortion within the lingula is identified, nonspecific measuring 3.9 x 2.2 cm with SUV max 2.48. There are bilateral pleural effusions, left greater than right. Increased tracer uptake within the left pleural effusion has an SUV max of 2.25. Cannot exclude malignant effusion. Incidental CT findings: Aortic atherosclerosis. Lad, left circumflex and RCA coronary artery calcifications identified. ABDOMEN/PELVIS: Within the left retroperitoneum extending into the paraspinal region and involving  the left psoas muscle. This measures 4.1 x 4.7 cm and has SUV max of 5.0. Multiple small FDG avid right retroperitoneal and right upper quadrant lymph nodes are identified. I suspect, there is asymmetric involvement of the right renal hilum with associated mild hydronephrosis. Difficulty to measure lymph nodes in this region individually. The SUV max within the right retroperitoneum is equal to 4.30. There is mild increased radiotracer uptake within both adrenal glands are identified which appear mildly enlarged. SUV max within the left adrenal gland is equal to 3.38. SUV max associated with the right adrenal gland is equal to 4.67. Incidental CT findings: Cholecystectomy. Aortic atherosclerosis. Mild right hydronephrosis. Status post left nephroureteral stent placement with mild residual hydronephrosis. Multiple large right kidney cysts identified. Small volume of free fluid noted within the abdomen and pelvis. SKELETON: Multifocal abnormal areas of increased radiotracer uptake are identified involving the axial and appendicular skeleton compatible with osseous involvement by lymphoma. Asymmetric increased uptake within the proximal left humerus has an SUV max of 3.23. Asymmetric increased uptake localizing to the right side of the sternal manubrium has an SUV max of 3.67. Underlying lucent bone lesion is identified measuring 1.4 cm. FDG avid lesion involving bilateral ribs. Index rib lesion on the left has an  SUV max of 4.7. Multiple FDG avid lesions are seen within the pelvis. Index lesion within the left side of sacrum has an SUV max of 4.39. Diffuse heterogeneous uptake throughout the spine noted with multiple FDG avid lesions. Index lesion involving the L4 vertebra has an SUV max of 4.77. Associated pathologic fracture involving the L4 vertebra is again noted. Incidental CT findings: none IMPRESSION: 1. Exam positive for FDG avid tumor within the paraspinal region of the posterior mediastinum. There is also an FDG avid tumor within the left retroperitoneum. Poorly defined FDG avid soft tissue infiltration and nodularity within the right retroperitoneum and right upper quadrant of the abdomen is noted with suspected involvement of the right renal hilum. 2. 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 Many of these FDG avid lesions have corresponding lucent bone lesions on CT. Hypermetabolic tumor involving the L4 vertebra is noted with corresponding pathologic fracture. 3. Nonspecific masslike architectural distortion with mild increased uptake is noted within the lingula. Cannot exclude pulmonary the involvement by lymphoma. 4. Bilateral pleural effusions, left greater than right. Malignant pleural effusions not excluded. 5. Interval placement of left-sided nephroureteral stent with residual mild left hydronephrosis. 6.  Aortic Atherosclerosis (ICD10-I70.0). Electronically Signed   By: Kerby Moors M.D.   On: 12/07/2019 14:57   DG Chest Port 1 View  Result Date: 12/12/2019 CLINICAL DATA:  Pain status post fall EXAM: PORTABLE CHEST 1 VIEW COMPARISON:  08/01/2019 FINDINGS: There is a new moderate to large left-sided pleural effusion. There is a small right-sided pleural effusion. The heart size is relatively normal. Aortic calcifications are noted. There is no pneumothorax. There is no definite acute displaced fracture however evaluation of the left-sided ribs is limited by the large pleural effusion and  osteopenia. The patient's known posterior mediastinal mass is better appreciated on prior CT. IMPRESSION: Moderate to large left-sided pleural effusion, new since prior study. Electronically Signed   By: Constance Holster M.D.   On: 12/12/2019 01:52   DG Foot 2 Views Left  Result Date: 12/12/2019 CLINICAL DATA:  Pain status post fall EXAM: LEFT FOOT - 2 VIEW COMPARISON:  None. FINDINGS: There is no evidence of fracture or dislocation. There is no evidence of arthropathy or other  focal bone abnormality. There is mild soft tissue swelling about the dorsal aspect of the forefoot. Is a small plantar calcaneal spur. There are degenerative changes of the midfoot. IMPRESSION: No acute displaced fracture. Electronically Signed   By: Constance Holster M.D.   On: 12/12/2019 01:46   CT BONE MARROW BIOPSY & ASPIRATION  Result Date: 12/15/2019 INDICATION: 85 year old with concern for myeloma.  Known retroperitoneal lesion. EXAM: CT GUIDED BONE MARROW ASPIRATES AND BIOPSY Physician: Stephan Minister. Anselm Pancoast, MD MEDICATIONS: None. ANESTHESIA/SEDATION: Fentanyl 25 mcg IV; Versed 0.5 mg IV Moderate Sedation Time:  12 minutes The patient was continuously monitored during the procedure by the interventional radiology nurse under my direct supervision. COMPLICATIONS: None immediate. PROCEDURE: The procedure was explained to the patient. The risks and benefits of the procedure were discussed and the patient's questions were addressed. Informed consent was obtained from the patient. The patient was placed prone on CT table. Images of the pelvis were obtained. The left side of back was prepped and draped in sterile fashion. The skin and left posterior ilium were anesthetized with 1% lidocaine. 11 gauge bone needle was directed into the left ilium with CT guidance. Two aspirates and one core biopsy were obtained. Bandage placed over the puncture site. FINDINGS: Again noted is soft tissue fullness along the left lower periaortic region. Partial  visualization of the right kidney with renal cysts. Small amount of fluid or edema in the presacral region. Bones are heterogeneous with subtle areas of lucency. Bone needle was directed into the posterior left ilium. Adequate bone marrow samples were obtained. IMPRESSION: CT guided bone marrow aspiration and core biopsy. Electronically Signed   By: Markus Daft M.D.   On: 12/15/2019 10:46   DG C-Arm 1-60 Min-No Report  Result Date: 11/19/2019 Fluoroscopy was utilized by the requesting physician.  No radiographic interpretation.   CT Renal Stone Study  Result Date: 12/12/2019 CLINICAL DATA:  Lymphoma.  Hydronephrosis. EXAM: CT ABDOMEN AND PELVIS WITHOUT CONTRAST TECHNIQUE: Multidetector CT imaging of the abdomen and pelvis was performed following the standard protocol without IV contrast. COMPARISON:  Ultrasound 12/11/2019. PET scan 12/07/2019. CT 11/17/2019. FINDINGS: Lower chest: Enlarging effusions left more than right with atelectasis in both lower lungs. Lymphoma mass in the retrocrural space, increased in volume compared to the study of January 12th. Thickness of tumor in this region up to 3 cm. Previously this measured about 2 cm. Majority encasement of the aorta. Hepatobiliary: No visible liver parenchymal lesion. Previous cholecystectomy. Pancreas: Negative Spleen: Negative Adrenals/Urinary Tract: Left adrenal gland appears normal. Right adrenal gland difficult to separate from retroperitoneal mass. Left kidney shows a double-J ureteral stent in place. Mild fullness of the left renal collecting system. Renal stent an ureter passes along side a left-sided retroperitoneal mass which has enlarged since the previous study, measuring approximately 4 x 7 cm compared with 2 x 4 cm previously. Hydronephrosis of the right kidney again seen, with multiple cysts as well. Tumor infiltrating the region of the hilum has increased in volume. Bladder appears unremarkable, except for the left double-J ureteral stent  entering the bladder. Stomach/Bowel: No evidence of bowel obstruction. Mild edema adjacent to the proximal sigmoid colon could possibly represent low level diverticulitis. This is more pronounced than was seen previously. Vascular/Lymphatic: See above regarding extensive retroperitoneal lymphoma tissue. Reproductive: Previous hysterectomy. Other: No free fluid or air. Musculoskeletal: Subacute fractures of the spine as seen previously, not grossly progressive. No lytic lesion or pathologic fracture of the pelvic bones. IMPRESSION: Pleural effusions  larger on the left than the right with dependent atelectasis. Increase in tumor volume in the retrocrural region and the left retroperitoneum at the abdominopelvic junction region. See above. Increase in amount of tumor in the right renal hilar region. Double-J ureteral stent in place on the left. Mild fullness of the left renal collecting system. Hydronephrosis of the right kidney, likely obstructed by the tumor in the renal hilar region. Increased edema in the fat in the proximal sigmoid colon region suggests mild diverticulitis. Multiple lumbar compression fractures at not visibly progressed. Electronically Signed   By: Nelson Chimes M.D.   On: 12/12/2019 05:24   IR NEPHROSTOMY PLACEMENT LEFT  Result Date: 12/13/2019 INDICATION: 84 year old female with a history of left-sided hydronephrosis, referred for nephrostomy placement EXAM: IR NEPHROSTOMY PLACEMENT LEFT COMPARISON:  None. MEDICATIONS: None ANESTHESIA/SEDATION: Fentanyl 100 mcg IV; Versed 2.0 mg IV Moderate Sedation Time:  15 minutes The patient was continuously monitored during the procedure by the interventional radiology nurse under my direct supervision. CONTRAST:  76m OMNIPAQUE IOHEXOL 300 MG/ML SOLN - administered into the collecting system(s) FLUOROSCOPY TIME:  Fluoroscopy Time: 3 minutes 12 seconds (22 mGy). COMPLICATIONS: None PROCEDURE: Informed written consent was obtained from the patient after a  thorough discussion of the procedural risks, benefits and alternatives. All questions were addressed. Maximal Sterile Barrier Technique was utilized including caps, mask, sterile gowns, sterile gloves, sterile drape, hand hygiene and skin antiseptic. A timeout was performed prior to the initiation of the procedure. Patient positioned prone position on the fluoroscopy table. Ultrasound survey of the left flank was performed with images stored and sent to PACs. The patient was then prepped and draped in the usual sterile fashion. 1% lidocaine was used to anesthetize the skin and subcutaneous tissues for local anesthesia. A Chiba needle was then used to access a posterior inferior calyx with ultrasound guidance. With spontaneous urine returned through the needle, passage of an 018 micro wire into the collecting system was performed under fluoroscopy. A small incision was made with an 11 blade scalpel, and the needle was removed from the wire. An Accustick system was then advanced over the wire into the collecting system under fluoroscopy. The metal stiffener and inner dilator were removed, and then a sample of fluid was aspirated through the 4 French outer sheath. Bentson wire was passed into the collecting system and the sheath removed. Ten French dilation of the soft tissues was performed. The dilator encountered a site of resistance and a kink in the wire developed. We then removed the dilator and pass the Accustick 4 French sheath over the wire into the collecting system. The wire was then removed entirely, and a new 035 Amplatz wire was placed. Using modified Seldinger technique, a 10 French pigtail catheter drain was placed over the Amplatz wire. Wire and inner stiffener removed, and the pigtail was formed in the collecting system. Small amount of contrast confirmed position of the catheter. Patient tolerated the procedure well and remained hemodynamically stable throughout. No complications were encountered and  no significant blood loss encountered IMPRESSION: Status post placement of left percutaneous nephrostomy. Signed, JDulcy Fanny WDellia Nims RPVI [facility] Electronically Signed   By: JCorrie MckusickD.O.   On: 12/13/2019 15:24   VAS [place]OWER EXTREMITY VENOUS (DVT)  Result Date: 12/13/2019  Lower Venous DVT Study Indications: Edema.  Limitations: Body habitus and edema. Comparison Study: Prior Right Lower extremity venous duplex from 06/09/2018 is  available for comparison Performing Technologist: Sharion Dove RVS  Examination Guidelines: A complete evaluation includes B-mode imaging, spectral Doppler, color Doppler, and power Doppler as needed of all accessible portions of each vessel. Bilateral testing is considered an integral part of a complete examination. Limited examinations for reoccurring indications may be performed as noted. The reflux portion of the exam is performed with the patient in reverse Trendelenburg.  +---------+---------------+---------+-----------+----------+-------------------+ RIGHT    CompressibilityPhasicitySpontaneityPropertiesThrombus Aging      +---------+---------------+---------+-----------+----------+-------------------+ CFV      Full           Yes      Yes                                      +---------+---------------+---------+-----------+----------+-------------------+ SFJ      Full                                                             +---------+---------------+---------+-----------+----------+-------------------+ FV Prox  Full                                                             +---------+---------------+---------+-----------+----------+-------------------+ FV Mid   Full                                                             +---------+---------------+---------+-----------+----------+-------------------+ FV Distal               Yes      Yes                   patent by Color and                                                       Doppler             +---------+---------------+---------+-----------+----------+-------------------+ PFV      Full                                                             +---------+---------------+---------+-----------+----------+-------------------+ POP                     Yes      Yes                  patent by Color and  Doppler             +---------+---------------+---------+-----------+----------+-------------------+ PTV      Full                                                             +---------+---------------+---------+-----------+----------+-------------------+ PERO     Full                                                             +---------+---------------+---------+-----------+----------+-------------------+   +---------+---------------+---------+-----------+----------+--------------+ LEFT     CompressibilityPhasicitySpontaneityPropertiesThrombus Aging +---------+---------------+---------+-----------+----------+--------------+ CFV      Full           Yes      Yes                                 +---------+---------------+---------+-----------+----------+--------------+ SFJ      Full                                                        +---------+---------------+---------+-----------+----------+--------------+ FV Prox  Full                                                        +---------+---------------+---------+-----------+----------+--------------+ FV Mid   Full                                                        +---------+---------------+---------+-----------+----------+--------------+ FV DistalFull                                                        +---------+---------------+---------+-----------+----------+--------------+ PFV      Full                                                         +---------+---------------+---------+-----------+----------+--------------+ POP      Full           Yes      Yes                                 +---------+---------------+---------+-----------+----------+--------------+ PTV      Full                                                        +---------+---------------+---------+-----------+----------+--------------+  PERO     Full                                                        +---------+---------------+---------+-----------+----------+--------------+     Summary: RIGHT: - Findings appear essentially unchanged compared to previous examination. - There is no evidence of deep vein thrombosis in the lower extremity. However, portions of this examination were limited- see technologist comments above.  LEFT: - There is no evidence of deep vein thrombosis in the lower extremity.  *See table(s) above for measurements and observations. Electronically signed by Ruta Hinds MD on 12/13/2019 at 11:40:24 AM.    Final    US THORACENTESIS ASP PLEURAL SPACE W/IMG GUIDE  Result Date: 12/12/2019 INDICATION: Patient with history of retroperitoneal malignant neoplasm was bilateral pleural effusion presents for therapeutic and diagnostic thoracentesis. EXAM: ULTRASOUND GUIDED THERAPEUTIC AND DIAGNOSTIC THORACENTESIS MEDICATIONS: Lidocaine 1% 10 mL COMPLICATIONS: None immediate. PROCEDURE: An ultrasound guided thoracentesis was thoroughly discussed with the patient and questions answered. The benefits, risks, alternatives and complications were also discussed. The patient understands and wishes to proceed with the procedure. Written consent was obtained. Ultrasound was performed to localize and mark an adequate pocket of fluid in the left-sided chest. The area was then prepped and draped in the normal sterile fashion. 1% Lidocaine was used for local anesthesia. Under ultrasound guidance a catheter was introduced.  Thoracentesis was performed. The catheter was removed and a dressing applied. FINDINGS: A total of approximately 650 mL of amber colored fluid was removed. Samples were sent to the laboratory as requested by the clinical team. IMPRESSION: Successful ultrasound guided therapeutic and diagnostic left-sided thoracentesis yielding 650 mL of pleural fluid. Read by Rushie Nyhan NP Electronically Signed   By: Corrie Mckusick D.O.   On: 12/12/2019 10:39    PERFORMANCE STATUS (ECOG) : 2 - Symptomatic, <50% confined to bed  Review of Systems Unless otherwise noted, a complete review of systems is negative.  Physical Exam General: NAD, frail appearing, thin Pulmonary: Unlabored Extremities: no edema, no joint deformities Skin: no rashes Neurological: Weakness but otherwise nonfocal  IMPRESSION: I met with patient and daughter following her first chemotherapy treatment.  Patient reports feeling well although she is somewhat drowsy.  She denies any significant changes or concerns.  Serum creatinine is down slightly today to 2.52 from 3.24 yesterday.  Patient currently denies pain but has had intermittent and generalized discomfort for which she has been receiving as needed oxycodone.   Patient was seen in consultation by PMT while at [facility]  Patient and family agreed to trial of chemotherapy but daughter states that their ultimate goal is for patient to get home and they want to ensure that she has good quality of life and comfort.  In the event that patient were to decline, family would probably opt for less aggressive measures.  PLAN: -Continue current scope of treatment -Agree with DNR -We will follow   Time Total: 60 minutes  Visit consisted of counseling and education dealing with the complex and emotionally intense issues of symptom management and palliative care in the setting of serious and potentially life-threatening illness.Greater than 50%  of this time was spent counseling  and coordinating care related to the above assessment and plan.  Signed by: Altha Harm, PhD, NP-C

## 2019-12-16 NOTE — Progress Notes (Signed)
PT Cancellation Note  Patient Details Name: Regina Harris MRN: [id number] DOB: Apr 30, 1936   Cancelled Treatment:    Reason Eval/Treat Not Completed: Other (comment); upon entering room, nsg stated that pt is starting chemotherapy treatment and requested PT hold at this time. Will attempt to see pt at a future date/time as medically appropriate.     Annabelle Harman, SPT 12/16/19 2:07 PM

## 2019-12-17 ENCOUNTER — Inpatient Hospital Stay: Payer: Medicare Other

## 2019-12-17 ENCOUNTER — Other Ambulatory Visit: Payer: Medicare Other

## 2019-12-17 LAB — CBC
HCT: 37.9 % (ref 36.0–46.0)
Hemoglobin: 12.2 g/dL (ref 12.0–15.0)
MCH: 30.6 pg (ref 26.0–34.0)
MCHC: 32.2 g/dL (ref 30.0–36.0)
MCV: 95 fL (ref 80.0–100.0)
Platelets: 186 10*3/uL (ref 150–400)
RBC: 3.99 MIL/uL (ref 3.87–5.11)
RDW: 12 % (ref 11.5–15.5)
WBC: 6.3 10*3/uL (ref 4.0–10.5)
nRBC: 0 % (ref 0.0–0.2)

## 2019-12-17 LAB — CULTURE, BODY FLUID W GRAM STAIN -BOTTLE: Culture: NO GROWTH

## 2019-12-17 LAB — IGG, IGA, IGM
IgA: 1608 mg/dL — ABNORMAL HIGH (ref 64–422)
IgG (Immunoglobin G), Serum: 420 mg/dL — ABNORMAL LOW (ref 586–1602)
IgM (Immunoglobulin M), Srm: 22 mg/dL — ABNORMAL LOW (ref 26–217)

## 2019-12-17 LAB — BASIC METABOLIC PANEL
Anion gap: 14 (ref 5–15)
BUN: 31 mg/dL — ABNORMAL HIGH (ref 8–23)
CO2: 22 mmol/L (ref 22–32)
Calcium: 8.6 mg/dL — ABNORMAL LOW (ref 8.9–10.3)
Chloride: 104 mmol/L (ref 98–111)
Creatinine, Ser: 2.07 mg/dL — ABNORMAL HIGH (ref 0.44–1.00)
GFR calc Af Amer: 25 mL/min — ABNORMAL LOW (ref >60)
GFR calc non Af Amer: 22 mL/min — ABNORMAL LOW (ref >60)
Glucose, Bld: 188 mg/dL — ABNORMAL HIGH (ref 70–99)
Potassium: 4.1 mmol/L (ref 3.5–5.1)
Sodium: 140 mmol/L (ref 135–145)

## 2019-12-17 LAB — SURGICAL PATHOLOGY

## 2019-12-17 LAB — LACTIC ACID, PLASMA: Lactic Acid, Venous: 1.9 mmol/L (ref 0.5–1.9)

## 2019-12-17 MED ORDER — IOHEXOL 9 MG/ML PO SOLN
500.0000 mL | ORAL | Status: AC
  Administered 2019-12-17 – 2019-12-18 (×2): 500 mL via ORAL

## 2019-12-17 MED ORDER — MORPHINE SULFATE (PF) 2 MG/ML IV SOLN
2.0000 mg | INTRAVENOUS | Status: AC
  Administered 2019-12-17: 2 mg via INTRAVENOUS
  Filled 2019-12-17: qty 1

## 2019-12-17 NOTE — Progress Notes (Signed)
[facility] Progress Note  Patient: Regina Harris    HCW:[id number]  DOA: 12/15/2019     Date of Service: the patient was seen and examined on 12/17/2019  Chief complaint. Multiple myeloma  Brief hospital course: Regina Harris is a 84 y.o. female with medical history significant for retroperitoneal neoplasm highly suspected to be kappa chain myeloma, hypertension, hyperlipidemia, and hypothyroidism who is transferred to [facility] hospital from [facility] for initiation of chemotherapy.  Patient has been hospitalized at [facility] from 12/11/2019-12/15/2019 for acute renal failure due to obstructive uropathy with bilateral hydronephrosis.  She was also noted to have moderate to large left-sided pleural effusion and underwent ultrasound-guided thoracentesis on 12/12/2019 with removal of 650 mL of pleural fluid.  Cytology showed presence of malignant cells.  On 12/13/2019 she had a left-sided percutaneous nephrostomy tube placed by interventional radiology.  Prior to transfer to [facility] on 12/15/2019 she underwent a bone marrow biopsy with pathology still pending.  She was also begun on treatment of UTI with amoxicillin x7 days for 40,000 colonies Enterococcus faecalis seen on urine culture.  Patient currently has no complaints other than continued back and lower extremity pain.  Her granddaughter, [name], is at bedside.  They have had thorough discussions regarding her plan of care.  They are agreeable to try chemotherapy with primary goals focusing on improvement in performance status.  If not having significant improvement in performance status/quality of life, they anticipate returning to home with palliative/hospice services.  Currently further plan is to pain control and arrange for outpatient discharge  Assessment and Plan: Kappa light chain myeloma/retroperitoneal malignancy: Retroperitoneal biopsy and cytology from pleural fluid is consistent with plasma cell  neoplasm. Metastatic bone survey did not reveal any significant bony pathology. Bone marrow biopsy results are pending. Dr. Grayland Ormond, oncology are following and plan for initial inpatient chemotherapy with Velcade, Cytoxan, dexamethasone. -Appreciate oncology assistance -Continue pain control as needed Ready for discharge from oncology perspective tomorrow.  Acute kidney injury: In setting of myeloma kidney and obstructive uropathy.  S/p left percutaneous nephrostomy tube placed on 12/13/2019. -Continues to have good output from nephrostomy -Creatinine remains elevated but stable, continue to monitor -Lasix has been discontinued  Left-sided pleural effusion: S/p ultrasound-guided thoracentesis on 12/12/2019 with removal of 650 mL of pleural fluid.  Cytology showed presence of malignant cells.  Currently stable and saturating well on room air.  UTI: Urine culture collected 12/12/2019 grew 40,000 colonies Enterococcus faecalis.  She has been started on amoxicillin which we will continue to complete 7 days.  Hypertension: BP mildly elevated in setting of pain.  Continue to monitor, can use IV labetalol as needed.  Avoid addition of ACE/ARB/thiazide in setting of renal dysfunction.  Hypothyroidism: Continue Synthroid.  Hyperlipidemia: Continue atorvastatin.  Generalized weakness/deconditioning: Request PT/OT evaluation.  Mild cognitive impairment: Continue home donepezil.  Goals of care discussion. Family wants to take the patient home instead of SNF. Continue current scope of treatment. Currently DNR. Outpatient palliative care follow-up.  Diet: Regular diet DVT Prophylaxis: Subcutaneous Heparin    Advance goals of care discussion: DNR  Family Communication: family was present at bedside, at the time of interview.  The pt provided permission to discuss medical plan with the family. Opportunity was given to ask question and all questions were answered satisfactorily.    Disposition:  Pt is from home, admitted with chemotherapy for multiple myeloma precludes a safe discharge. Discharge to home with home health per family request likely tomorrow on 12/18/2019.  Subjective: has some chest pain no nausea no vomiting no fever no chills.  Chest pain is chronic and not new.  Physical Exam: General:  alert oriented to time and place.  Appear in moderate distress, affect flat in affect Eyes: PERRL ENT: Oral Mucosa Clear, dry  Neck: difficult to assess  JVD,  Cardiovascular: S1 and S2 Present, no Murmur,  Respiratory: increased respiratory effort, Bilateral Air entry equal and Decreased, basal Crackles, no wheezes Abdomen: Bowel Sound present, Soft and no tenderness,  Skin: no rash Extremities: trace Pedal edema, no calf tenderness Neurologic: without any new focal findings  Gait not checked due to patient safety concerns  Vitals:   12/17/19 0500 12/17/19 0749 12/17/19 1140 12/17/19 1557  BP:  (!) 144/83  (!) 116/56  Pulse:  86 (!) 110 78  Resp:  17  16  Temp:  98.1 F (36.7 C)  98.4 F (36.9 C)  TempSrc:  Oral  Oral  SpO2:  94% 93% 95%  Weight: 93.4 kg       Intake/Output Summary (Last 24 hours) at 12/17/2019 1950 Last data filed at 12/17/2019 1508 Gross per 24 hour  Intake 200 ml  Output 300 ml  Net -100 ml   Filed Weights   12/16/19 0500 12/17/19 0500  Weight: 96.9 kg 93.4 kg    Data Reviewed: I have personally reviewed and interpreted daily labs, tele strips, imagings as discussed above. I reviewed all nursing notes, pharmacy notes, vitals, pertinent old records I have discussed plan of care as described above with RN and patient/family.  CBC: Recent Labs  Lab 12/12/19 0048 12/13/19 0633 12/14/19 0541 12/15/19 0630 12/16/19 0249  WBC 5.4 4.0 4.9 4.4 5.0  NEUTROABS 3.9 2.4 3.0 3.0 3.5  HGB 13.1 11.5* 12.3 13.0 12.5  HCT 42.0 36.2 38.9 39.6 38.3  MCV 98.8 97.6 97.3 94.1 93.2  PLT 170 131* 150 151 734*   Basic Metabolic  Panel: Recent Labs  Lab 12/13/19 0633 12/14/19 0541 12/15/19 0630 12/16/19 0249 12/17/19 0912  NA 142 143 142 141 140  K 4.2 4.0 3.5 3.5 4.1  CL 113* 112* 107 108 104  CO2 17* 18* 22 22 22   GLUCOSE 101* 94 94 92 188*  BUN 32* 33* 30* 30* 31*  CREATININE 3.38* 3.47* 3.24* 2.52* 2.07*  CALCIUM 8.5* 8.4* 8.9 8.5* 8.6*    Studies: DG Bone Survey Met  Result Date: 12/17/2019 CLINICAL DATA:  Kappa light chain myeloma EXAM: METASTATIC BONE SURVEY COMPARISON:  PET-CT from 12/07/2019 FINDINGS: Lateral view of the skull reveals no lytic or sclerotic lesions. Degenerative changes of the cervicothoracic and lumbar spine are noted without evidence of lytic or sclerotic lesions. No new compression deformities are seen. Chronic L3 compression deformity is noted. Upper extremities demonstrate degenerative changes of the shoulder joints bilaterally. No lytic or sclerotic lesions are seen. Pelvis demonstrates degenerative changes of the hip joints bilaterally. Known left sacral lesion is not well appreciated on this exam. Left nephrostomy and left ureteral stent are seen. Lower extremities demonstrate degenerative changes of the hip and knee joints bilaterally. No lytic or sclerotic lesions are seen. The ribs demonstrate no definitive lesions although areas of increased activity were noted on prior PET-CT. IMPRESSION: No definitive lytic or sclerotic lesions. Electronically Signed   By: Inez Catalina M.D.   On: 12/17/2019 10:42    Scheduled Meds: . amoxicillin  250 mg Oral Q12H  . aspirin EC  81 mg Oral Daily  . atorvastatin  10 mg Oral  Daily  . donepezil  5 mg Oral QHS  . enoxaparin (LOVENOX) injection  30 mg Subcutaneous Q24H  . gabapentin  400 mg Oral BID  . levothyroxine  75 mcg Oral Q breakfast  . megestrol  40 mg Oral Daily  . pantoprazole  40 mg Oral Daily  . sodium chloride flush  3 mL Intravenous Q12H   Continuous Infusions: . sodium chloride    . ondansetron (ZOFRAN) IV     PRN Meds:  sodium chloride, acetaminophen, alum & mag hydroxide-simeth, guaiFENesin-dextromethorphan, hydrocortisone, labetalol, methocarbamol, ondansetron **OR** ondansetron **OR** ondansetron (ZOFRAN) IV **OR** ondansetron (ZOFRAN) IV, oxyCODONE, senna-docusate, sodium chloride flush, traMADol  Time spent: 35 minutes  Author: Berle Mull, MD [facility] 12/17/2019 7:50 PM  To reach On-call, see care teams to locate the attending and reach out to them via [email or web address]. If 7PM-7AM, please contact night-coverage If you still have difficulty reaching the attending provider, please page the [facility] (Director on Call) for [facility] on amion for assistance.

## 2019-12-17 NOTE — Progress Notes (Signed)
Pt with decreased urinary output from Left Nephrostomy tube, 343mL in 12hour shift. pt with complaints of pain PAIND 2 to left lower quadrant. Discussed with Sharion Settler, NP; Pt started on fluid restriction today, will control patient pain and monitor urine output. No further orders at this time. Call placed to Patient daugther Hoyle Sauer, updated regarding plan of care.

## 2019-12-17 NOTE — TOC Progression Note (Signed)
Transition of Care [facility]) - Progression Note    Patient Details  Name: [name] MRN: [id number] Date of Birth: Mar 13, 1936  [facility] CM/SW Contact  Yanett Conkright, Gardiner Rhyme, LCSW Phone Number: 12/17/2019, 1:46 PM  Clinical Narrative:   Met with pt and daughter-Carolyn who is in the room to discuss equipment needs. Daughter feels all they need is a hospital bed and wheelchair and wants to wait on the hoyer lift. She feels her Mom does move better at home and if they need one can always get one later. Have contacted [employer] to order the above equipment. Aware pt is to be discharged tomorrow. Discussed with daughter pt going non-emergency EMS for safe transfer home. She is agreeable. Will inform [facility] and Palliative Care of discharge tomorrow. Pt is more alert and participatory today. Daughter feels she is better after the chemo treatment yesterday. Will work toward discharge tomorrow.    Expected Discharge Plan: [place] Barriers to Discharge: Continued Medical Work up  Expected Discharge Plan and Services Expected Discharge Plan: [place] In-house Referral: Clinical Social Work     Living arrangements for the past 2 months: Single Family Home                                       Social Determinants of Health (SDOH) Interventions    Readmission Risk Interventions No flowsheet data found.

## 2019-12-17 NOTE — Progress Notes (Signed)
[place]  [phone] [phone]  ID: Regina Harris OB: November 09, 1935  MR#: [id number]  XBD#:[id number]  Patient Care Team: Abner Greenspan, MD as PCP - General Byrnett, Forest Gleason, MD (General Surgery) Tobi Bastos, RN as [facility]  CHIEF COMPLAINT: Kappa chain myeloma.  INTERVAL HISTORY: Patient significantly improved today.  Sitting in bed eating dinner.  Denies any pain.  Daughter is at bedside.  REVIEW OF SYSTEMS:   Review of Systems  Constitutional: Positive for malaise/fatigue. Negative for fever and weight loss.  Respiratory: Negative.  Negative for cough, hemoptysis and shortness of breath.   Cardiovascular: Negative.  Negative for chest pain and leg swelling.  Gastrointestinal: Negative.  Negative for abdominal pain and diarrhea.  Genitourinary: Negative.  Negative for dysuria.  Musculoskeletal: Negative.  Negative for back pain.  Skin: Negative.  Negative for rash.  Neurological: Positive for weakness. Negative for dizziness, focal weakness and headaches.  Psychiatric/Behavioral: Negative.  The patient is not nervous/anxious.     As per HPI. Otherwise, a complete review of systems is negative.  PAST MEDICAL HISTORY: Past Medical History:  Diagnosis Date  . Barrett esophagus   . Carotid bruit   . Diabetes mellitus without complication ([place])   . Esophagitis   . Gastritis 2013  . GERD (gastroesophageal reflux disease)   . HH (hiatus hernia)   . History of colonic polyps   . History of repair of right rotator cuff   . Hyperlipidemia   . Hypertension   . Hyperthyroidism 06/09/2018   Per [facility] RAI Therapy for Hyperthyroidism order  . Lump or mass in breast   . Pneumonia   . Polycythemia, secondary   . Tobacco abuse     PAST SURGICAL HISTORY: Past Surgical History:  Procedure Laterality Date  . ABDOMINAL HYSTERECTOMY    . APPENDECTOMY    . BREAST BIOPSY Right 1996  . BREAST BIOPSY Left  10/09/2012   Benign breast tissue with focal fat necrosis and focal periductal chronic inflammation.  Marland Kitchen BREAST BIOPSY Left 10/09/2012   Benign breast tissue with focal fat necrosis and focal periductal chronic inflammation.  Marland Kitchen CATARACT EXTRACTION  3/11   Dr Charise Killian  . CHOLECYSTECTOMY    . COLONOSCOPY  2009  . CYSTOSCOPY WITH URETEROSCOPY AND STENT PLACEMENT Left 11/19/2019   Procedure: CYSTOSCOPY WITH URETEROSCOPY AND STENT PLACEMENT;  Surgeon: Billey Co, MD;  Location: [facility] ORS;  Service: Urology;  Laterality: Left;  . ESOPHAGOGASTRODUODENOSCOPY (EGD) WITH PROPOFOL N/A 11/02/2015   Procedure: ESOPHAGOGASTRODUODENOSCOPY (EGD) WITH PROPOFOL;  Surgeon: Manya Silvas, MD;  Location: [facility] ENDOSCOPY;  Service: Endoscopy;  Laterality: N/A;  . IR NEPHROSTOMY PLACEMENT LEFT  12/13/2019  . SHOULDER ARTHROSCOPY WITH OPEN ROTATOR CUFF REPAIR Right 03/05/2016   Procedure: SHOULDER ARTHROSCOPY WITH OPEN ROTATOR CUFF REPAIR;  Surgeon: Earnestine Leys, MD;  Location: [facility] ORS;  Service: Orthopedics;  Laterality: Right;  . UPPER GI ENDOSCOPY  2013    FAMILY HISTORY: Family History  Problem Relation Age of Onset  . Kidney cancer Father   . Diabetes Mother   . Breast cancer Neg Hx     ADVANCED DIRECTIVES (Y/N):  @ADVDIR @  HEALTH MAINTENANCE: Social History   Tobacco Use  . Smoking status: Former Smoker    Packs/day: 1.00    Types: Cigarettes  . Smokeless tobacco: Never Used  Substance Use Topics  . Alcohol use: No    Alcohol/week: 0.0 standard drinks  . Drug use: No  Colonoscopy:  PAP:  Bone density:  Lipid panel:  Allergies  Allergen Reactions  . Benadryl [Diphenhydramine] Other (See Comments)    Hallucinations; family request to not give this medication.    Current Facility-Administered Medications  Medication Dose Route Frequency Provider Last Rate Last Admin  . 0.9 %  sodium chloride infusion  250 mL Intravenous PRN Lloyd Huger, MD      . acetaminophen (TYLENOL)  tablet 650 mg  650 mg Oral Q4H PRN Lloyd Huger, MD   650 mg at 12/17/19 0505  . alum & mag hydroxide-simeth (MAALOX/MYLANTA) 200-200-20 MG/5ML suspension 60 mL  60 mL Oral Q4H PRN Lloyd Huger, MD      . amoxicillin (AMOXIL) capsule 250 mg  250 mg Oral Q12H Lloyd Huger, MD   250 mg at 12/17/19 2376  . aspirin EC tablet 81 mg  81 mg Oral Daily Lloyd Huger, MD   81 mg at 12/17/19 2831  . atorvastatin (LIPITOR) tablet 10 mg  10 mg Oral Daily Lloyd Huger, MD   10 mg at 12/17/19 5176  . donepezil (ARICEPT) tablet 5 mg  5 mg Oral QHS Lloyd Huger, MD   5 mg at 12/16/19 2105  . enoxaparin (LOVENOX) injection 30 mg  30 mg Subcutaneous Q24H Zada Finders R, MD   30 mg at 12/16/19 2104  . gabapentin (NEURONTIN) capsule 400 mg  400 mg Oral BID Lloyd Huger, MD   400 mg at 12/17/19 1607  . guaiFENesin-dextromethorphan (ROBITUSSIN DM) 100-10 MG/5ML syrup 10 mL  10 mL Oral Q4H PRN Lloyd Huger, MD      . hydrocortisone (ANUSOL-HC) 2.5 % rectal cream 1 application  1 application Rectal BID PRN Lloyd Huger, MD      . labetalol (NORMODYNE) injection 5 mg  5 mg Intravenous Q4H PRN Lenore Cordia, MD      . levothyroxine (SYNTHROID) tablet 75 mcg  75 mcg Oral Q breakfast Lloyd Huger, MD   75 mcg at 12/17/19 0505  . megestrol (MEGACE) tablet 40 mg  40 mg Oral Daily Lloyd Huger, MD   40 mg at 12/17/19 3710  . methocarbamol (ROBAXIN) tablet 500 mg  500 mg Oral Q8H PRN Lavina Hamman, MD      . ondansetron [facility]) tablet 4-8 mg  4-8 mg Oral Q8H PRN Lloyd Huger, MD       Or  . ondansetron (ZOFRAN-ODT) disintegrating tablet 4-8 mg  4-8 mg Oral Q8H PRN Lloyd Huger, MD       Or  . ondansetron (ZOFRAN) injection 4 mg  4 mg Intravenous Q8H PRN Lloyd Huger, MD       Or  . ondansetron (ZOFRAN) 8 mg in sodium chloride 0.9 % 50 mL IVPB  8 mg Intravenous Q8H PRN Lloyd Huger, MD      . oxyCODONE (Oxy  IR/ROXICODONE) immediate release tablet 5 mg  5 mg Oral Q4H PRN Lang Snow, NP   5 mg at 12/17/19 1045  . pantoprazole (PROTONIX) EC tablet 40 mg  40 mg Oral Daily Lloyd Huger, MD   40 mg at 12/17/19 6269  . senna-docusate (Senokot-S) tablet 1 tablet  1 tablet Oral QHS PRN Lloyd Huger, MD      . sodium chloride flush (NS) 0.9 % injection 3 mL  3 mL Intravenous Q12H Lloyd Huger, MD   3 mL at 12/17/19 0938  . sodium chloride  flush (NS) 0.9 % injection 3 mL  3 mL Intravenous PRN Lloyd Huger, MD      . traMADol Veatrice Bourbon) tablet 50 mg  50 mg Oral Q8H PRN Lloyd Huger, MD   50 mg at 12/16/19 1203   Facility-Administered Medications Ordered in Other Encounters  Medication Dose Route Frequency Provider Last Rate Last Admin  . bortezomib SQ (VELCADE) chemo injection 3.25 mg  1.5 mg/m2 Subcutaneous Once Lloyd Huger, MD      . cyclophosphamide (CYTOXAN) 500 mg in sodium chloride 0.9 % 250 mL chemo infusion  232 mg/m2 Intravenous Once Lloyd Huger, MD      . dexamethasone (DECADRON) 20 mg in sodium chloride 0.9 % 50 mL IVPB  20 mg Intravenous Once Lloyd Huger, MD      . palonosetron (ALOXI) injection 0.25 mg  0.25 mg Intravenous Once Lloyd Huger, MD        OBJECTIVE: Vitals:   12/17/19 1140 12/17/19 1557  BP:  (!) 116/56  Pulse: (!) 110 78  Resp:  16  Temp:  98.4 F (36.9 C)  SpO2: 93% 95%     Body mass index is 32.25 kg/m.    ECOG FS:3 - Symptomatic, >50% confined to bed  General: Well-developed, well-nourished, no acute distress. Eyes: Pink conjunctiva, anicteric sclera. HEENT: Normocephalic, moist mucous membranes. Lungs: No audible wheezing or coughing. Heart: Regular rate and rhythm. Abdomen: Soft, nontender, no obvious distention. Musculoskeletal: No edema, cyanosis, or clubbing. Neuro: Alert, answering all questions appropriately. Cranial nerves grossly intact. Skin: No rashes or petechiae noted. Psych:  Normal affect.   LAB RESULTS:  Lab Results  Component Value Date   NA 140 12/17/2019   K 4.1 12/17/2019   CL 104 12/17/2019   CO2 22 12/17/2019   GLUCOSE 188 (H) 12/17/2019   BUN 31 (H) 12/17/2019   CREATININE 2.07 (H) 12/17/2019   CALCIUM 8.6 (L) 12/17/2019   PROT 6.9 12/16/2019   ALBUMIN 2.3 (L) 12/16/2019   AST 12 (L) 12/16/2019   ALT 9 12/16/2019   ALKPHOS 43 12/16/2019   BILITOT 0.6 12/16/2019   GFRNONAA 22 (L) 12/17/2019   GFRAA 25 (L) 12/17/2019    Lab Results  Component Value Date   WBC 5.0 12/16/2019   NEUTROABS 3.5 12/16/2019   HGB 12.5 12/16/2019   HCT 38.3 12/16/2019   MCV 93.2 12/16/2019   PLT 148 (L) 12/16/2019     STUDIES: CT ABDOMEN PELVIS WO CONTRAST  Result Date: 11/17/2019 CLINICAL DATA:  84 year old female with left-sided abdominal pain. Concern for acute diverticulitis. EXAM: CT ABDOMEN AND PELVIS WITHOUT CONTRAST TECHNIQUE: Multidetector CT imaging of the abdomen and pelvis was performed following the standard protocol without IV contrast. COMPARISON:  CT abdomen pelvis dated 06/13/2006. FINDINGS: Evaluation of this exam is limited in the absence of intravenous contrast. Lower chest: Trace left pleural effusion. There is thickened appearance of the posteromedial lower lobe pleural surfaces. Partially visualized infiltrative mass encasing the distal descending thoracic aorta and abutting the lower thoracic spine at T10-T11. This may represent an infiltrative neoplasm, lymphoma, or fibrosis versus sequela osteomyelitis/discitis. This is new since the CT of 07/17/2010. Further evaluation with MRI without and with contrast is recommended. Bibasilar subpleural linear and streaky densities may represent atelectasis/scarring. Infiltrate is less likely. Clinical correlation is recommended. There is no intra-abdominal free air or free fluid. Hepatobiliary: The liver is unremarkable. No intrahepatic biliary ductal dilatation. Cholecystectomy. No retained calcified  stone noted in the central CBD. Pancreas:  There is mild haziness of the distal pancreas which may represent acute pancreatitis. Correlation with pancreatic enzymes recommended. No drainable fluid collection/abscess or pseudocyst Spleen: Normal in size without focal abnormality. Adrenals/Urinary Tract: There is a 2 cm left adrenal adenoma. The right adrenal gland is unremarkable. There is mild left hydronephrosis. No calcified stone identified. There is haziness and stranding of the proximal left periureteric fat which may represent an infection/inflammatory process or fibrosis and possible degree of ureteral stricture. There is a 2 cm left renal inferior pole hypodense lesion which is not characterized but demonstrates fluid attenuation most consistent with a cyst. There is no hydronephrosis or nephrolithiasis on the right. Multiple right renal cysts measure up to approximately 7 cm. The right ureter and urinary bladder appear unremarkable. Stomach/Bowel: There is sigmoid diverticulosis with muscular hypertrophy. Mild perisigmoid stranding and haziness may represent mild acute diverticulitis. Clinical correlation is recommended. There is diffuse thickened appearance of the colon which may be related to underdistention. Diffuse submucosal fat deposit along the colonic wall likely sequela of chronic inflammatory process. There is no bowel obstruction. Appendectomy. Vascular/Lymphatic: Advanced aortoiliac atherosclerotic disease. There is stranding of the periaortic fat. There is a 3.8 x 2.1 cm left para-aortic/paraspinal soft tissue lesion which is not characterized but may represent enlarged lymph node (series 2 image 38). However, this soft tissue appears to abut the left L4 vertebra and therefore a neoplasm or an infectious process is not excluded. No portal venous gas. The IVC is unremarkable. Reproductive: Hysterectomy. Other: None Musculoskeletal: Osteopenia with extensive multilevel degenerative changes, disc  desiccation and vacuum phenomena. Old-appearing multilevel compression fractures most prominent involving L3 and L4. No definite acute fracture. No retropulsed fragment. IMPRESSION: 1. Sigmoid diverticulosis with findings concerning for mild diverticulitis. No diverticular abscess or perforation. 2. No bowel obstruction. 3. Mild left hydronephrosis, possibly secondary to a degree of stricture or decreased peristalsis of the left ureter due to adjacent inflammatory process or fibrosis. No stone. 4. Mild stranding of the distal pancreas. Correlation with pancreatic enzymes recommended to evaluate for acute pancreatitis. No fluid collection. 5. Infiltrative soft tissue mass along the lower thoracic spine at T10-T11 with encasement of the aorta. This may represent an inflammatory/infectious process (osteomyelitis/discitis) or an infiltrative neoplasm. Further evaluation with MRI without and with contrast is recommended. 6. Thickened appearance of the inferior medial lower lobe pleural surfaces which may be secondary to infiltration of paraspinal/periaortic mass or sequela of chronic inflammation. 7. Left para-aortic soft tissue density to the left of L4 may represent adenopathy or a neoplasm. Electronically Signed   By: Anner Crete M.D.   On: 11/17/2019 18:09   DG Chest 1 View  Result Date: 12/12/2019 CLINICAL DATA:  Status post LEFT thoracentesis. EXAM: CHEST  1 VIEW COMPARISON:  12/12/2019 FINDINGS: LEFT pleural effusion has decreased, now trace. LEFT basilar atelectasis has improved. A trace RIGHT pleural effusion may be present. There is no evidence of pneumothorax. IMPRESSION: Decreased LEFT pleural effusion, now trace, with improved LEFT basilar atelectasis. No evidence of pneumothorax. Electronically Signed   By: Margarette Canada M.D.   On: 12/12/2019 10:49   DG Knee 2 Views Left  Result Date: 12/12/2019 CLINICAL DATA:  Knee pain EXAM: LEFT KNEE - 1-2 VIEW COMPARISON:  None. FINDINGS: No fracture or  dislocation. Tricompartmental osteoarthritis is noted with joint space loss and subchondral sclerosis. No large knee joint effusion. Patellar enthesopathy seen. Vascular calcifications are noted. IMPRESSION: No acute osseous abnormality. Electronically Signed   By: Ebony Cargo.D.  On: 12/12/2019 03:35   DG Abd 1 View  Result Date: 12/11/2019 CLINICAL DATA:  Ureteral stent. Lower abdominal pain. EXAM: ABDOMEN - 1 VIEW COMPARISON:  November 17, 2019. FINDINGS: The bowel gas pattern is normal. Status post cholecystectomy. Left ureteral stent is noted in grossly good position. Phleboliths are noted in the pelvis. No nephrolithiasis is noted. IMPRESSION: Left ureteral stent is noted in grossly good position. No evidence of bowel obstruction or ileus. Electronically Signed   By: Marijo Conception M.D.   On: 12/11/2019 16:30   CT Head Wo Contrast  Result Date: 12/12/2019 CLINICAL DATA:  Fall weakness EXAM: CT HEAD WITHOUT CONTRAST TECHNIQUE: Contiguous axial images were obtained from the base of the skull through the vertex without intravenous contrast. COMPARISON:  December 07, 2019 brain MRI FINDINGS: Brain: No evidence of acute territorial infarction, hemorrhage, hydrocephalus,extra-axial collection or mass lesion/mass effect. There is dilatation the ventricles and sulci consistent with age-related atrophy. Low-attenuation changes in the deep white matter consistent with small vessel ischemia. Vascular: No hyperdense vessel or unexpected calcification. Skull: The skull is intact. No fracture or focal lesion identified. Sinuses/Orbits: The visualized paranasal sinuses and mastoid air cells are clear. The orbits and globes intact. Other: Small laceration seen over the posterior left parietal skull. IMPRESSION: No acute intracranial abnormality. Findings consistent with age related atrophy and chronic small vessel ischemia Electronically Signed   By: Prudencio Pair M.D.   On: 12/12/2019 00:45   CT CHEST WO  CONTRAST  Result Date: 11/18/2019 CLINICAL DATA:  Adenopathy EXAM: CT CHEST WITHOUT CONTRAST TECHNIQUE: Multidetector CT imaging of the chest was performed following the standard protocol without IV contrast. COMPARISON:  Chest CT 07/17/2010 FINDINGS: Cardiovascular: Normal heart size. No pericardial effusion. Aortic and coronary atherosclerosis. Mediastinum/Nodes: Infiltrative posterior mediastinal mass partially encasing the descending aorta with indistinguishable plane between the mass and the posterior wall of the aorta separate from areas of intimal calcification. The infiltrative abnormality extends approximately 9 cm craniocaudal and 5.6 cm in diameter. The hila and other mediastinal spaces are negative for adenopathy. Lungs/Pleura: Small pleural effusions on both sides. There is subpleural extension of the mass with no visible nodularity along the pleural surface, although limited without contrast. Dependent atelectasis. No worrisome pulmonary nodules. Upper Abdomen: Partial coverage shows left hydronephrosis likely due to the retroperitoneal mass medial to the left psoas on prior CT. Right renal cystic change and atherosclerotic calcification. Cholecystectomy. Musculoskeletal: The posterior mediastinal mass is in close proximity to the spine but does not cause visible erosion or infiltrative lucency. No gross spinal canal mass. No evidence of hematogenous osseous metastases. IMPRESSION: 1. Approximately 9 x 6 cm infiltrative posterior mediastinal mass intimately associated with the descending aorta and thoracic spine. 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 2. Known left hydronephrosis related to infiltrative mass in the retroperitoneum. 3. Small pleural effusions with lower lobe atelectasis. Electronically Signed   By: Monte Fantasia M.D.   On: 11/18/2019 11:27   CT GUIDED NEEDLE PLACEMENT  Result Date: 11/20/2019 INDICATION: No known primary, now with retroperitoneal mass worrisome for lymphoma. Please perform  CT-guided biopsy for tissue diagnostic purposes. EXAM: CT GUIDANCE NEEDLE PLACEMENT COMPARISON:  CT abdomen and pelvis-09/16/2020 MEDICATIONS: None. ANESTHESIA/SEDATION: Fentanyl 50 mcg IV; Versed 1 mg IV Sedation time: 16 minutes; The patient was continuously monitored during the procedure by the interventional radiology nurse under my direct supervision. CONTRAST:  None. COMPLICATIONS: None immediate. PROCEDURE: Informed consent was obtained from the patient following an explanation of the procedure, risks, benefits and alternatives.  A time out was performed prior to the initiation of the procedure. The patient was positioned prone on the CT table and a limited CT was performed for procedural planning demonstrating unchanged size and appearance the left-sided retroperitoneal mass, presumed nodal conglomeration with dominant component measuring approximately 4.5 x 2.6 cm (image 21, series 3). The procedure was planned. The operative site was prepped and draped in the usual sterile fashion. Appropriate trajectory was confirmed with a 22 gauge spinal needle after the adjacent tissues were anesthetized with 1% Lidocaine with epinephrine. Under intermittent CT guidance, a 17 gauge coaxial needle was advanced into the peripheral aspect of the mass. Appropriate positioning was confirmed and 6 core needle biopsy samples were obtained with an 18 gauge core needle biopsy device. The co-axial needle was removed following the administration of a Gel-Foam slurry and superficial hemostasis was achieved with manual compression. A limited postprocedural CT was negative for hemorrhage or additional complication. A dressing was placed. The patient tolerated the procedure well without immediate postprocedural complication. IMPRESSION: Technically successful CT guided core needle biopsy of left-sided retroperitoneal mass, presumed nodal conglomeration. Electronically Signed   By: Sandi Mariscal M.D.   On: 11/20/2019 09:43   MR Brain W  Wo Contrast  Result Date: 12/07/2019 CLINICAL DATA:  Lymphoma. EXAM: MRI HEAD WITHOUT AND WITH CONTRAST TECHNIQUE: Multiplanar, multiecho pulse sequences of the brain and surrounding structures were obtained without and with intravenous contrast. CONTRAST:  81m GADAVIST GADOBUTROL 1 MMOL/ML IV SOLN COMPARISON:  Head CT 07/29/2019 FINDINGS: The study is moderately motion degraded. Brain: There is no evidence of acute infarct, intracranial hemorrhage, mass, midline shift, or extra-axial fluid collection. There is mild to moderate cerebral atrophy. T2 hyperintensities in the cerebral white matter bilaterally are nonspecific but compatible with mild chronic small vessel ischemic disease. No enhancing lesions are identified, however note that small lesions could be easily obscured by the degree of motion artifact. Vascular: Major intracranial vascular flow voids are preserved. Skull and upper cervical spine: No gross skull lesion identified. Sinuses/Orbits: Bilateral cataract extraction. Mild right sphenoid sinus mucosal thickening. Small left mastoid effusion. Other: None. IMPRESSION: 1. Motion degraded examination without evidence of intracranial metastases. 2. Mild chronic small vessel ischemic disease. Electronically Signed   By: ALogan BoresM.D.   On: 12/07/2019 15:52   NM Renal Imaging Flow W/O Pharm  Result Date: 12/14/2019 CLINICAL DATA:  Hydronephrosis, abnormal CT, LEFT renal collecting system dilatation with prior stenting, RIGHT hydronephrosis with tumor invasion of RIGHT renal hilum, history lymphoma EXAM: NUCLEAR MEDICINE RENAL SCAN TECHNIQUE: Radionuclide angiographic and sequential renal images were obtained after intravenous injection of radiopharmaceutical. Lasix not administered due to new acute renal failure. RADIOPHARMACEUTICALS:  5.38 mCi [id number]-[id number] IV COMPARISON:  CT abdomen and pelvis 12/12/2019 FINDINGS: Blood flow: Poor tracer bolus. Probable delayed blood flow to both kidneys  though this is suboptimally assessed. LEFT kidney: Delayed uptake, concentration and excretion of tracer by LEFT kidney. Central photopenia early in exam corresponds to mildly dilated collecting system. Delayed excretion of into the dilated collecting system. Significant retained tracer within the dilated collecting system and the renal cortex at the conclusion of the exam. Analysis of the renogram curve demonstrates a continually increasing curve, with delayed time to peak activity of 42.7 minutes. RIGHT kidney: Delayed and concentration of tracer by RIGHT kidney. The upper and mid portions of the RIGHT kidney demonstrate no renal function, corresponding to marked hydronephrosis and tumor invasion on CT. No significant excretion of tracer into a dilated  collecting system is seen over the course of the exam. No ureteral activity identified. Significant retained tracer at the inferior pole the RIGHT kidney at the conclusion of the study. Analysis of the renogram curve demonstrates a continually climbing curved peaking at the conclusion of the exam at 45.7 minutes. Differential renal function: LEFT: 82% RIGHT: 18% IMPRESSION: Absent function at the upper and mid RIGHT kidney corresponding to hydronephrosis and tumor invasion on CT. Markedly impaired BILATERAL renal function with significantly delayed uptake, concentration and excretion of tracer, evidence by continually increasing renogram curves bilaterally. Asymmetric renal function, markedly greater on LEFT than RIGHT. Electronically Signed   By: Lavonia Dana M.D.   On: 12/14/2019 08:41   US RENAL  Result Date: 12/11/2019 CLINICAL DATA:  Retroperitoneal mass on CT, increasing creatinine EXAM: RENAL / URINARY TRACT ULTRASOUND COMPLETE COMPARISON:  CT abdomen and pelvis 11/18/2019 FINDINGS: Right Kidney: Renal measurements: 11.9 x 7.9 x 7.9 cm = volume: 788 mL. Cortical thinning. Normal cortical echogenicity. Mild-to-moderate RIGHT hydronephrosis. Cystic lesion at  upper pole 7.2 x 5.9 x 5.7 cm, simple features. Additional peripelvic/mid RIGHT renal cyst 5.7 x 4.3 x 4.0 cm. No solid masses or shadowing calculi. Left Kidney: Renal measurements: 12.1 x 6.3 x 6.9 cm = volume: 273 mL. Cortical thinning. Mild hydronephrosis. Minimally increased cortical echogenicity. Small mass at upper pole 2.8 x 3.2 x 2.3 cm, by prior CT exam appears represent a splenic lobulation rather than a renal lesion. Small exophytic cyst at inferior pole 2.1 x 1.9 x 1.6 cm. No additional renal mass. Bladder: Stent in urinary bladder.  Bladder poorly distended. Other: Small LEFT pleural effusion. IMPRESSION: BILATERAL hydronephrosis, slightly greater on RIGHT. BILATERAL renal cysts larger on RIGHT. Small LEFT pleural effusion. Electronically Signed   By: Lavonia Dana M.D.   On: 12/11/2019 14:48   NM PET Image Initial (PI) Skull Base To Thigh  Result Date: 12/07/2019 CLINICAL DATA:  Initial treatment strategy for lymphoma. EXAM: NUCLEAR MEDICINE PET SKULL BASE TO THIGH TECHNIQUE: 11.8 mCi F-18 FDG was injected intravenously. Full-ring PET imaging was performed from the skull base to thigh after the radiotracer. CT data was obtained and used for attenuation correction and anatomic localization. Fasting blood glucose: 81 mg/dl COMPARISON:  CT chest abdomen and pelvis 11/18/2019 FINDINGS: Mediastinal blood pool activity: SUV max 2.6 Liver activity: SUV max 3.14. NECK: No hypermetabolic lymph nodes in the neck. Incidental CT findings: none CHEST: Paraspinal soft tissue mass within the posterior mediastinum measures 7.3 x 6.9 cm with SUV max of 3.59. Masslike architectural distortion within the lingula is identified, nonspecific measuring 3.9 x 2.2 cm with SUV max 2.48. There are bilateral pleural effusions, left greater than right. Increased tracer uptake within the left pleural effusion has an SUV max of 2.25. Cannot exclude malignant effusion. Incidental CT findings: Aortic atherosclerosis. Lad, left  circumflex and RCA coronary artery calcifications identified. ABDOMEN/PELVIS: Within the left retroperitoneum extending into the paraspinal region and involving the left psoas muscle. This measures 4.1 x 4.7 cm and has SUV max of 5.0. Multiple small FDG avid right retroperitoneal and right upper quadrant lymph nodes are identified. I suspect, there is asymmetric involvement of the right renal hilum with associated mild hydronephrosis. Difficulty to measure lymph nodes in this region individually. The SUV max within the right retroperitoneum is equal to 4.30. There is mild increased radiotracer uptake within both adrenal glands are identified which appear mildly enlarged. SUV max within the left adrenal gland is equal to 3.38. SUV max  associated with the right adrenal gland is equal to 4.67. Incidental CT findings: Cholecystectomy. Aortic atherosclerosis. Mild right hydronephrosis. Status post left nephroureteral stent placement with mild residual hydronephrosis. Multiple large right kidney cysts identified. Small volume of free fluid noted within the abdomen and pelvis. SKELETON: Multifocal abnormal areas of increased radiotracer uptake are identified involving the axial and appendicular skeleton compatible with osseous involvement by lymphoma. Asymmetric increased uptake within the proximal left humerus has an SUV max of 3.23. Asymmetric increased uptake localizing to the right side of the sternal manubrium has an SUV max of 3.67. Underlying lucent bone lesion is identified measuring 1.4 cm. FDG avid lesion involving bilateral ribs. Index rib lesion on the left has an SUV max of 4.7. Multiple FDG avid lesions are seen within the pelvis. Index lesion within the left side of sacrum has an SUV max of 4.39. Diffuse heterogeneous uptake throughout the spine noted with multiple FDG avid lesions. Index lesion involving the L4 vertebra has an SUV max of 4.77. Associated pathologic fracture involving the L4 vertebra is  again noted. Incidental CT findings: none IMPRESSION: 1. Exam positive for FDG avid tumor within the paraspinal region of the posterior mediastinum. There is also an FDG avid tumor within the left retroperitoneum. Poorly defined FDG avid soft tissue infiltration and nodularity within the right retroperitoneum and right upper quadrant of the abdomen is noted with suspected involvement of the right renal hilum. 2. 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 Many of these FDG avid lesions have corresponding lucent bone lesions on CT. Hypermetabolic tumor involving the L4 vertebra is noted with corresponding pathologic fracture. 3. Nonspecific masslike architectural distortion with mild increased uptake is noted within the lingula. Cannot exclude pulmonary the involvement by lymphoma. 4. Bilateral pleural effusions, left greater than right. Malignant pleural effusions not excluded. 5. Interval placement of left-sided nephroureteral stent with residual mild left hydronephrosis. 6.  Aortic Atherosclerosis (ICD10-I70.0). Electronically Signed   By: Kerby Moors M.D.   On: 12/07/2019 14:57   DG Chest Port 1 View  Result Date: 12/12/2019 CLINICAL DATA:  Pain status post fall EXAM: PORTABLE CHEST 1 VIEW COMPARISON:  08/01/2019 FINDINGS: There is a new moderate to large left-sided pleural effusion. There is a small right-sided pleural effusion. The heart size is relatively normal. Aortic calcifications are noted. There is no pneumothorax. There is no definite acute displaced fracture however evaluation of the left-sided ribs is limited by the large pleural effusion and osteopenia. The patient's known posterior mediastinal mass is better appreciated on prior CT. IMPRESSION: Moderate to large left-sided pleural effusion, new since prior study. Electronically Signed   By: Constance Holster M.D.   On: 12/12/2019 01:52   DG Bone Survey Met  Result Date: 12/17/2019 CLINICAL DATA:  Kappa light chain myeloma EXAM:  METASTATIC BONE SURVEY COMPARISON:  PET-CT from 12/07/2019 FINDINGS: Lateral view of the skull reveals no lytic or sclerotic lesions. Degenerative changes of the cervicothoracic and lumbar spine are noted without evidence of lytic or sclerotic lesions. No new compression deformities are seen. Chronic L3 compression deformity is noted. Upper extremities demonstrate degenerative changes of the shoulder joints bilaterally. No lytic or sclerotic lesions are seen. Pelvis demonstrates degenerative changes of the hip joints bilaterally. Known left sacral lesion is not well appreciated on this exam. Left nephrostomy and left ureteral stent are seen. Lower extremities demonstrate degenerative changes of the hip and knee joints bilaterally. No lytic or sclerotic lesions are seen. The ribs demonstrate no definitive lesions although  areas of increased activity were noted on prior PET-CT. IMPRESSION: No definitive lytic or sclerotic lesions. Electronically Signed   By: Inez Catalina M.D.   On: 12/17/2019 10:42   DG Foot 2 Views Left  Result Date: 12/12/2019 CLINICAL DATA:  Pain status post fall EXAM: LEFT FOOT - 2 VIEW COMPARISON:  None. FINDINGS: There is no evidence of fracture or dislocation. There is no evidence of arthropathy or other focal bone abnormality. There is mild soft tissue swelling about the dorsal aspect of the forefoot. Is a small plantar calcaneal spur. There are degenerative changes of the midfoot. IMPRESSION: No acute displaced fracture. Electronically Signed   By: Constance Holster M.D.   On: 12/12/2019 01:46   CT BONE MARROW BIOPSY & ASPIRATION  Result Date: 12/15/2019 INDICATION: 84 year old with concern for myeloma.  Known retroperitoneal lesion. EXAM: CT GUIDED BONE MARROW ASPIRATES AND BIOPSY Physician: Stephan Minister. Anselm Pancoast, MD MEDICATIONS: None. ANESTHESIA/SEDATION: Fentanyl 25 mcg IV; Versed 0.5 mg IV Moderate Sedation Time:  12 minutes The patient was continuously monitored during the procedure by the  interventional radiology nurse under my direct supervision. COMPLICATIONS: None immediate. PROCEDURE: The procedure was explained to the patient. The risks and benefits of the procedure were discussed and the patient's questions were addressed. Informed consent was obtained from the patient. The patient was placed prone on CT table. Images of the pelvis were obtained. The left side of back was prepped and draped in sterile fashion. The skin and left posterior ilium were anesthetized with 1% lidocaine. 11 gauge bone needle was directed into the left ilium with CT guidance. Two aspirates and one core biopsy were obtained. Bandage placed over the puncture site. FINDINGS: Again noted is soft tissue fullness along the left lower periaortic region. Partial visualization of the right kidney with renal cysts. Small amount of fluid or edema in the presacral region. Bones are heterogeneous with subtle areas of lucency. Bone needle was directed into the posterior left ilium. Adequate bone marrow samples were obtained. IMPRESSION: CT guided bone marrow aspiration and core biopsy. Electronically Signed   By: Markus Daft M.D.   On: 12/15/2019 10:46   DG C-Arm 1-60 Min-No Report  Result Date: 11/19/2019 Fluoroscopy was utilized by the requesting physician.  No radiographic interpretation.   CT Renal Stone Study  Result Date: 12/12/2019 CLINICAL DATA:  Lymphoma.  Hydronephrosis. EXAM: CT ABDOMEN AND PELVIS WITHOUT CONTRAST TECHNIQUE: Multidetector CT imaging of the abdomen and pelvis was performed following the standard protocol without IV contrast. COMPARISON:  Ultrasound 12/11/2019. PET scan 12/07/2019. CT 11/17/2019. FINDINGS: Lower chest: Enlarging effusions left more than right with atelectasis in both lower lungs. Lymphoma mass in the retrocrural space, increased in volume compared to the study of January 12th. Thickness of tumor in this region up to 3 cm. Previously this measured about 2 cm. Majority encasement of the  aorta. Hepatobiliary: No visible liver parenchymal lesion. Previous cholecystectomy. Pancreas: Negative Spleen: Negative Adrenals/Urinary Tract: Left adrenal gland appears normal. Right adrenal gland difficult to separate from retroperitoneal mass. Left kidney shows a double-J ureteral stent in place. Mild fullness of the left renal collecting system. Renal stent an ureter passes along side a left-sided retroperitoneal mass which has enlarged since the previous study, measuring approximately 4 x 7 cm compared with 2 x 4 cm previously. Hydronephrosis of the right kidney again seen, with multiple cysts as well. Tumor infiltrating the region of the hilum has increased in volume. Bladder appears unremarkable, except for the left double-J ureteral  stent entering the bladder. Stomach/Bowel: No evidence of bowel obstruction. Mild edema adjacent to the proximal sigmoid colon could possibly represent low level diverticulitis. This is more pronounced than was seen previously. Vascular/Lymphatic: See above regarding extensive retroperitoneal lymphoma tissue. Reproductive: Previous hysterectomy. Other: No free fluid or air. Musculoskeletal: Subacute fractures of the spine as seen previously, not grossly progressive. No lytic lesion or pathologic fracture of the pelvic bones. IMPRESSION: Pleural effusions larger on the left than the right with dependent atelectasis. Increase in tumor volume in the retrocrural region and the left retroperitoneum at the abdominopelvic junction region. See above. Increase in amount of tumor in the right renal hilar region. Double-J ureteral stent in place on the left. Mild fullness of the left renal collecting system. Hydronephrosis of the right kidney, likely obstructed by the tumor in the renal hilar region. Increased edema in the fat in the proximal sigmoid colon region suggests mild diverticulitis. Multiple lumbar compression fractures at not visibly progressed. Electronically Signed   By: Nelson Chimes M.D.   On: 12/12/2019 05:24   IR NEPHROSTOMY PLACEMENT LEFT  Result Date: 12/13/2019 INDICATION: 84 year old female with a history of left-sided hydronephrosis, referred for nephrostomy placement EXAM: IR NEPHROSTOMY PLACEMENT LEFT COMPARISON:  None. MEDICATIONS: None ANESTHESIA/SEDATION: Fentanyl 100 mcg IV; Versed 2.0 mg IV Moderate Sedation Time:  15 minutes The patient was continuously monitored during the procedure by the interventional radiology nurse under my direct supervision. CONTRAST:  57m OMNIPAQUE IOHEXOL 300 MG/ML SOLN - administered into the collecting system(s) FLUOROSCOPY TIME:  Fluoroscopy Time: 3 minutes 12 seconds (22 mGy). COMPLICATIONS: None PROCEDURE: Informed written consent was obtained from the patient after a thorough discussion of the procedural risks, benefits and alternatives. All questions were addressed. Maximal Sterile Barrier Technique was utilized including caps, mask, sterile gowns, sterile gloves, sterile drape, hand hygiene and skin antiseptic. A timeout was performed prior to the initiation of the procedure. Patient positioned prone position on the fluoroscopy table. Ultrasound survey of the left flank was performed with images stored and sent to PACs. The patient was then prepped and draped in the usual sterile fashion. 1% lidocaine was used to anesthetize the skin and subcutaneous tissues for local anesthesia. A Chiba needle was then used to access a posterior inferior calyx with ultrasound guidance. With spontaneous urine returned through the needle, passage of an 018 micro wire into the collecting system was performed under fluoroscopy. A small incision was made with an 11 blade scalpel, and the needle was removed from the wire. An Accustick system was then advanced over the wire into the collecting system under fluoroscopy. The metal stiffener and inner dilator were removed, and then a sample of fluid was aspirated through the 4 French outer sheath. Bentson  wire was passed into the collecting system and the sheath removed. Ten French dilation of the soft tissues was performed. The dilator encountered a site of resistance and a kink in the wire developed. We then removed the dilator and pass the Accustick 4 French sheath over the wire into the collecting system. The wire was then removed entirely, and a new 035 Amplatz wire was placed. Using modified Seldinger technique, a 10 French pigtail catheter drain was placed over the Amplatz wire. Wire and inner stiffener removed, and the pigtail was formed in the collecting system. Small amount of contrast confirmed position of the catheter. Patient tolerated the procedure well and remained hemodynamically stable throughout. No complications were encountered and no significant blood loss encountered IMPRESSION: Status post  placement of left percutaneous nephrostomy. Signed, Dulcy Fanny. Dellia Nims, RPVI [facility] Electronically Signed   By: Corrie Mckusick D.O.   On: 12/13/2019 15:24   VAS [place] LOWER EXTREMITY VENOUS (DVT)  Result Date: 12/13/2019  Lower Venous DVT Study Indications: Edema.  Limitations: Body habitus and edema. Comparison Study: Prior Right Lower extremity venous duplex from 06/09/2018 is                   available for comparison Performing Technologist: Sharion Dove RVS  Examination Guidelines: A complete evaluation includes B-mode imaging, spectral Doppler, color Doppler, and power Doppler as needed of all accessible portions of each vessel. Bilateral testing is considered an integral part of a complete examination. Limited examinations for reoccurring indications may be performed as noted. The reflux portion of the exam is performed with the patient in reverse Trendelenburg.  +---------+---------------+---------+-----------+----------+-------------------+ RIGHT    CompressibilityPhasicitySpontaneityPropertiesThrombus Aging       +---------+---------------+---------+-----------+----------+-------------------+ CFV      Full           Yes      Yes                                      +---------+---------------+---------+-----------+----------+-------------------+ SFJ      Full                                                             +---------+---------------+---------+-----------+----------+-------------------+ FV Prox  Full                                                             +---------+---------------+---------+-----------+----------+-------------------+ FV Mid   Full                                                             +---------+---------------+---------+-----------+----------+-------------------+ FV Distal               Yes      Yes                  patent by Color and                                                       Doppler             +---------+---------------+---------+-----------+----------+-------------------+ PFV      Full                                                             +---------+---------------+---------+-----------+----------+-------------------+  POP                     Yes      Yes                  patent by Color and                                                       Doppler             +---------+---------------+---------+-----------+----------+-------------------+ PTV      Full                                                             +---------+---------------+---------+-----------+----------+-------------------+ PERO     Full                                                             +---------+---------------+---------+-----------+----------+-------------------+   +---------+---------------+---------+-----------+----------+--------------+ LEFT     CompressibilityPhasicitySpontaneityPropertiesThrombus Aging +---------+---------------+---------+-----------+----------+--------------+ CFV      Full            Yes      Yes                                 +---------+---------------+---------+-----------+----------+--------------+ SFJ      Full                                                        +---------+---------------+---------+-----------+----------+--------------+ FV Prox  Full                                                        +---------+---------------+---------+-----------+----------+--------------+ FV Mid   Full                                                        +---------+---------------+---------+-----------+----------+--------------+ FV DistalFull                                                        +---------+---------------+---------+-----------+----------+--------------+ PFV      Full                                                        +---------+---------------+---------+-----------+----------+--------------+  POP      Full           Yes      Yes                                 +---------+---------------+---------+-----------+----------+--------------+ PTV      Full                                                        +---------+---------------+---------+-----------+----------+--------------+ PERO     Full                                                        +---------+---------------+---------+-----------+----------+--------------+     Summary: RIGHT: - Findings appear essentially unchanged compared to previous examination. - There is no evidence of deep vein thrombosis in the lower extremity. However, portions of this examination were limited- see technologist comments above.  LEFT: - There is no evidence of deep vein thrombosis in the lower extremity.  *See table(s) above for measurements and observations. Electronically signed by Ruta Hinds MD on 12/13/2019 at 11:40:24 AM.    Final    US THORACENTESIS ASP PLEURAL SPACE W/IMG GUIDE  Result Date: 12/12/2019 INDICATION: Patient with history of retroperitoneal  malignant neoplasm was bilateral pleural effusion presents for therapeutic and diagnostic thoracentesis. EXAM: ULTRASOUND GUIDED THERAPEUTIC AND DIAGNOSTIC THORACENTESIS MEDICATIONS: Lidocaine 1% 10 mL COMPLICATIONS: None immediate. PROCEDURE: An ultrasound guided thoracentesis was thoroughly discussed with the patient and questions answered. The benefits, risks, alternatives and complications were also discussed. The patient understands and wishes to proceed with the procedure. Written consent was obtained. Ultrasound was performed to localize and mark an adequate pocket of fluid in the left-sided chest. The area was then prepped and draped in the normal sterile fashion. 1% Lidocaine was used for local anesthesia. Under ultrasound guidance a catheter was introduced. Thoracentesis was performed. The catheter was removed and a dressing applied. FINDINGS: A total of approximately 650 mL of amber colored fluid was removed. Samples were sent to the laboratory as requested by the clinical team. IMPRESSION: Successful ultrasound guided therapeutic and diagnostic left-sided thoracentesis yielding 650 mL of pleural fluid. Read by Rushie Nyhan NP Electronically Signed   By: Corrie Mckusick D.O.   On: 12/12/2019 10:39    ASSESSMENT: Kappa chain myeloma.  PLAN:    1.  Kappa chain myeloma: Patient noted to have a significantly elevated kappa light chain of greater than 5000 along with IgA immunoglobulin of greater than 1700.  M spike is only mildly elevated at 1.7.  Retroperitoneal biopsy from last week as well as cytology from pleural fluid several days ago both containing plasma cells consistent with a plasma cell neoplasm.  Metastatic bone survey did not reveal any significant bony pathology. Bone marrow biopsy results are pending.  Patient received cycle 1, day 1 of chemotherapy yesterday using Velcade, Cytoxan, and dexamethasone.  Plan to give cycle 1, day 8 as an outpatient in the cancer center on Wednesday,  December 23, 2019.  Appreciate palliative care input. 2.  Acute renal failure: Creatinine continues to improve and is now  2.07.  Likely combination of nephrostomy tube as well as chemotherapy.  Repeat basic metabolic panel in the morning.  3.  Pain: Continue tramadol as needed. 4.  Disposition: Okay to discharge from an oncology standpoint on Friday if patient's creatinine continues to improve and home health is set up with durable medical equipment.  Patient is DNR/DNI.    Will follow.  Lloyd Huger, MD   12/17/2019 5:29 PM

## 2019-12-17 NOTE — Plan of Care (Signed)
  Problem: Education: Goal: Knowledge of General Education information will improve Description: Including pain rating scale, medication(s)/side effects and non-pharmacologic comfort measures Outcome: Progressing   Problem: Health Behavior/Discharge Planning: Goal: Ability to manage health-related needs will improve Outcome: Progressing   Problem: Clinical Measurements: Goal: Ability to maintain clinical measurements within normal limits will improve Outcome: Progressing Goal: Will remain free from infection Outcome: Progressing Goal: Diagnostic test results will improve Outcome: Progressing Goal: Respiratory complications will improve Outcome: Progressing Goal: Cardiovascular complication will be avoided Outcome: Progressing   Problem: Activity: Goal: Risk for activity intolerance will decrease Outcome: Progressing   Problem: Nutrition: Goal: Adequate nutrition will be maintained Outcome: Progressing   Problem: Coping: Goal: Level of anxiety will decrease Outcome: Progressing   Problem: Elimination: Goal: Will not experience complications related to bowel motility Outcome: Progressing Goal: Will not experience complications related to urinary retention Outcome: Progressing   Problem: Pain Managment: Goal: General experience of comfort will improve Outcome: Progressing   Problem: Safety: Goal: Ability to remain free from injury will improve Outcome: Progressing   Problem: Skin Integrity: Goal: Risk for impaired skin integrity will decrease Outcome: Progressing   Problem: Education: Goal: Knowledge of the prescribed therapeutic regimen will improve Outcome: Progressing   Problem: Activity: Goal: Ability to perform activities at highest level will improve Outcome: Progressing   Problem: Bowel/Gastric: Goal: Occurrences of nausea will decrease Outcome: Progressing Goal: Bowel function will improve Outcome: Progressing   Problem: Nutritional: Goal:  Maintenance of adequate nutrition will improve Outcome: Progressing   Problem: Skin Integrity: Goal: Status of oral mucous membranes will improve Outcome: Progressing

## 2019-12-17 NOTE — Evaluation (Signed)
Physical Therapy Evaluation Patient Details Name: Regina Harris MRN: [id number] DOB: [birth year] Today's Date: 12/17/2019   History of Present Illness  Regina Harris is an 31yoF who comes to [facility] on 12/11/19 s/p fall and progressive weakness at home. 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 Workup significant for AKI with creat of 1.7 and L hydronephrosis secondary to retro peritoneal mass. 1/14 she underwent cystoscopy with L ureteral stent placement. 1/22 e coli UTI. Pt seen in office on 2/4 d/t decline in function, renal US ordered on 2/5 which demonstrates B hydro nephrosis R>L. Urology ordering imaging from IR resultant "Lasix renogram showing 82% left-sided differential function with high-grade obstruction bilaterally." Urology recommendations for left-sided nephrostomy tube placement. She was also noted to have moderate to large left-sided pleural effusion and underwent ultrasound-guided thoracentesis on 12/12/2019 with removal of 650 mL of pleural fluid. Pt DC from [facility] transfer to inpatient bed [facility] on 2/9 for commencement of chemortherapy. Pt had first chemotherapy 2/10. DTR reports pt had COVID in Sept, sent home on O2, since weened.  Clinical Impression  Pt admitted with above diagnosis. Pt currently with functional limitations due to the deficits listed below (see "PT Problem List"). Upon entry, pt in bed, awake and agreeable to participate. DTR at bedside. The pt is alert and oriented x3, pleasant, conversational, and interactive. History taken from DTR. MaxA+2 for bed mobility, MinA STS from elevated surface. Attempted stepping at bedside precluded by weakness. Pt tolerates several minutes standing for assisted pericare, author supported entire time. Functional mobility assessment demonstrates increased effort/time requirements, poor tolerance, and need for physical assistance, whereas the patient performed these at a higher level of independence PTA. Pt will benefit from skilled  PT intervention to increase independence and safety with basic mobility in preparation for discharge to the venue listed below.       Follow Up Recommendations Home health PT;Supervision for mobility/OOB;Supervision - Intermittent(Family not agreeable to any type of facility placement.)    Equipment Recommendations  Hospital bed;Wheelchair (measurements PT);Wheelchair cushion (measurements PT)(anti tippers, elevated leg rests)    Recommendations for Other Services       Precautions / Restrictions Precautions Precautions: Fall Precaution Comments: Lt nephrostomy tube Restrictions Weight Bearing Restrictions: No      Mobility  Bed Mobility Overal bed mobility: Needs Assistance       Supine to sit: Max assist Sit to supine: Max assist;+2 for physical assistance;+2 for safety/equipment      Transfers Overall transfer level: Needs assistance Equipment used: 1 person hand held assist Transfers: Sit to/from Stand(performed twice from EOB) Sit to Stand: From elevated surface;Min assist         General transfer comment: Pt stands for 3 minutes while DTR assists with pericare and linen update.  Ambulation/Gait Ambulation/Gait assistance: (attempted side stepping at bedside largely unsuccessful.)              Stairs            Wheelchair Mobility    Modified Rankin (Stroke Patients Only)       Balance Overall balance assessment: Needs assistance Sitting-balance support: Single extremity supported;Feet supported Sitting balance-Leahy Scale: Fair Sitting balance - Comments: as fatigue settles in, limited ability to remain upright and fwd   Standing balance support: Bilateral upper extremity supported Standing balance-Leahy Scale: Poor Standing balance comment: mild posterior lean, no frank knee buckling  Pertinent Vitals/Pain Pain Assessment: Faces Faces Pain Scale: Hurts little more Pain Location: Left lower pec  major/costal area Pain Descriptors / Indicators: Sore Pain Intervention(s): Limited activity within patient's tolerance;Monitored during session    [place] expects to be discharged to:: Private residence Living Arrangements: Alone Available Help at Discharge: Other (Comment);Personal care attendant(Pt has 24/7 caregivers in home; ADL and mobility, was workin [facility] a few weeks) Type of Home: House Home Access: Stairs to enter   [employer] of Steps: 2 half steps Home Layout: One level Home Equipment: [place] - 4 wheels;Toilet riser;Tub bench      Prior Function Level of Independence: Needs assistance   Gait / Transfers Assistance Needed: AMB short distances in home with rollator for past several weeks  ADL's / Homemaking Assistance Needed: sitter/PCAs assist with bathing and preparing meals 24/7. Family either picks up or gets groceries delivered. Daughter-Carolyn typically transports pt when needed, reports pt able to take herself to restroom.  Comments: Family does not desire any rehab out of home.     Hand Dominance   Dominant Hand: Left    Extremity/Trunk Assessment   Upper Extremity Assessment Upper Extremity Assessment: Generalized weakness RUE Deficits / Details: hx rotator cuff issues, 3/4 shoulder arc of motion, but with crepidus, elbow, grip MMT 3+/5    Lower Extremity Assessment Lower Extremity Assessment: Generalized weakness    Cervical / Trunk Assessment Cervical / Trunk Assessment: Kyphotic  Communication   Communication: HOH  Cognition Arousal/Alertness: Awake/alert Behavior During Therapy: WFL for tasks assessed/performed Overall Cognitive Status: Within Functional Limits for tasks assessed                                 General Comments: Daughter-Carolyn, present on eval, asssists with PLOF/home setup information. Pt is awake and interactive while sitting up in bed.      General Comments       Exercises General Exercises - Lower Extremity Heel Slides: AROM;Both;10 reps;Supine Hip ABduction/ADduction: AROM;Both;10 reps;Supine Mini-Sqauts: Strengthening;Both;10 reps;Supine;Other (comment);Limitations Mini Squats Limitations: manually resisted hip/knee extension   Assessment/Plan    PT Assessment Patient needs continued PT services  PT Problem List Decreased strength;Decreased activity tolerance;Decreased balance;Decreased mobility       PT Treatment Interventions DME instruction;Gait training;Stair training;Functional mobility training;Therapeutic activities;Therapeutic exercise;Balance training;Patient/family education    PT Goals (Current goals can be found in the Care Plan section)  Acute Rehab PT Goals Patient Stated Goal: go home PT Goal Formulation: With patient/family Time For Goal Achievement: 12/31/19 Potential to Achieve Goals: Good    Frequency Min 2X/week   Barriers to discharge        Co-evaluation               AM-PAC PT "6 Clicks" Mobility  Outcome Measure Help needed turning from your back to your side while in a flat bed without using bedrails?: A Lot Help needed moving from lying on your back to sitting on the side of a flat bed without using bedrails?: Total Help needed moving to and from a bed to a chair (including a wheelchair)?: Total Help needed standing up from a chair using your arms (e.g., wheelchair or bedside chair)?: Total Help needed to walk in hospital room?: Total Help needed climbing 3-5 steps with a railing? : Total 6 Click Score: 7    End of Session Equipment Utilized During Treatment: Gait belt Activity Tolerance: Patient tolerated treatment well Patient  left: in bed;with call bell/phone within reach;with bed alarm set;with family/visitor present Nurse Communication: Mobility status PT Visit Diagnosis: Other abnormalities of gait and mobility (R26.89);Muscle weakness (generalized) (M62.81);Difficulty in walking, not  elsewhere classified (R26.2)    Time: 0100-7121 PT Time Calculation (min) (ACUTE ONLY): 33 min   Charges:   PT Evaluation $PT Eval High Complexity: 1 High PT Treatments $Therapeutic Exercise: 8-22 mins        11:54 AM, 12/17/19 Etta Grandchild, PT, DPT Physical Therapist - [facility]  [phone] ([place])   Verba Ainley C 12/17/2019, 11:50 AM

## 2019-12-17 NOTE — Progress Notes (Signed)
  Diagnosis code: D49.0 & U07.1  Height: 5'6  Weight: 205   Patient has neoplasm/cancer and pneumonia which requires her trunk and legs  to be positioned in ways not feasible with a normal bed.  Head must be elevated at least 30 degrees. She requires frequent and immediate changes in body position which cannot be achieved with a normal bed.

## 2019-12-17 NOTE — Progress Notes (Signed)
Patient suffers from significant weakness which impairs their ability to perform daily activities like walking in the home.  A walker alone will not resolve the issues with performing activities of daily living. A wheelchair will allow patient to safely perform daily activities.  The patient can self propel in the home or has a caregiver who can provide assistance.      11:55 AM, 12/17/19 Etta Grandchild, PT, DPT Physical Therapist - [facility]

## 2019-12-17 NOTE — Progress Notes (Signed)
Patient suffers from Cancer which impairs their ability to perform daily activities like ADLs in the home.  A walking aid will not resolve issue with performing activities of daily living such as feeding and bathing. A wheelchair will allow patient to safely perform daily activities. Patient is not able to propel themselves in the home using a standard weight wheelchair due to severe weakness. Patient can self propel in the lightweight wheelchair. Length of need Timeframe 99 months Accessories: elevating leg rests ELRs, wheel locks, extensions and anti-tippers.

## 2019-12-18 ENCOUNTER — Inpatient Hospital Stay: Payer: Medicare Other

## 2019-12-18 DIAGNOSIS — E039 Hypothyroidism, unspecified: Secondary | ICD-10-CM

## 2019-12-18 DIAGNOSIS — J90 Pleural effusion, not elsewhere classified: Secondary | ICD-10-CM

## 2019-12-18 DIAGNOSIS — N3000 Acute cystitis without hematuria: Secondary | ICD-10-CM

## 2019-12-18 DIAGNOSIS — E785 Hyperlipidemia, unspecified: Secondary | ICD-10-CM

## 2019-12-18 DIAGNOSIS — N189 Chronic kidney disease, unspecified: Secondary | ICD-10-CM

## 2019-12-18 DIAGNOSIS — N179 Acute kidney failure, unspecified: Secondary | ICD-10-CM

## 2019-12-18 LAB — COMPREHENSIVE METABOLIC PANEL
ALT: 9 U/L (ref 0–44)
AST: 14 U/L — ABNORMAL LOW (ref 15–41)
Albumin: 2.4 g/dL — ABNORMAL LOW (ref 3.5–5.0)
Alkaline Phosphatase: 38 U/L (ref 38–126)
Anion gap: 7 (ref 5–15)
BUN: 35 mg/dL — ABNORMAL HIGH (ref 8–23)
CO2: 25 mmol/L (ref 22–32)
Calcium: 8.5 mg/dL — ABNORMAL LOW (ref 8.9–10.3)
Chloride: 104 mmol/L (ref 98–111)
Creatinine, Ser: 2.01 mg/dL — ABNORMAL HIGH (ref 0.44–1.00)
GFR calc Af Amer: 26 mL/min — ABNORMAL LOW (ref >60)
GFR calc non Af Amer: 23 mL/min — ABNORMAL LOW (ref >60)
Glucose, Bld: 88 mg/dL (ref 70–99)
Potassium: 3.5 mmol/L (ref 3.5–5.1)
Sodium: 136 mmol/L (ref 135–145)
Total Bilirubin: 0.4 mg/dL (ref 0.3–1.2)
Total Protein: 6.4 g/dL — ABNORMAL LOW (ref 6.5–8.1)

## 2019-12-18 LAB — CBC WITH DIFFERENTIAL/PLATELET
Abs Immature Granulocytes: 0.03 10*3/uL (ref 0.00–0.07)
Basophils Absolute: 0 10*3/uL (ref 0.0–0.1)
Basophils Relative: 0 %
Eosinophils Absolute: 0 10*3/uL (ref 0.0–0.5)
Eosinophils Relative: 1 %
HCT: 36.1 % (ref 36.0–46.0)
Hemoglobin: 11.6 g/dL — ABNORMAL LOW (ref 12.0–15.0)
Immature Granulocytes: 1 %
Lymphocytes Relative: 22 %
Lymphs Abs: 1.2 10*3/uL (ref 0.7–4.0)
MCH: 30.3 pg (ref 26.0–34.0)
MCHC: 32.1 g/dL (ref 30.0–36.0)
MCV: 94.3 fL (ref 80.0–100.0)
Monocytes Absolute: 1.1 10*3/uL — ABNORMAL HIGH (ref 0.1–1.0)
Monocytes Relative: 19 %
Neutro Abs: 3.2 10*3/uL (ref 1.7–7.7)
Neutrophils Relative %: 57 %
Platelets: 176 10*3/uL (ref 150–400)
RBC: 3.83 MIL/uL — ABNORMAL LOW (ref 3.87–5.11)
RDW: 12.1 % (ref 11.5–15.5)
WBC: 5.6 10*3/uL (ref 4.0–10.5)
nRBC: 0 % (ref 0.0–0.2)

## 2019-12-18 LAB — MAGNESIUM: Magnesium: 1.2 mg/dL — ABNORMAL LOW (ref 1.7–2.4)

## 2019-12-18 LAB — KAPPA/LAMBDA LIGHT CHAINS
Kappa free light chain: 4814.5 mg/L — ABNORMAL HIGH (ref 3.3–19.4)
Kappa, lambda light chain ratio: 373.22 — ABNORMAL HIGH (ref 0.26–1.65)
Lambda free light chains: 12.9 mg/L (ref 5.7–26.3)

## 2019-12-18 LAB — LACTIC ACID, PLASMA: Lactic Acid, Venous: 0.9 mmol/L (ref 0.5–1.9)

## 2019-12-18 MED ORDER — HYDROCORTISONE (PERIANAL) 2.5 % EX CREA: TOPICAL_CREAM | CUTANEOUS | 0 refills | Status: AC

## 2019-12-18 MED ORDER — SODIUM CHLORIDE 0.9% FLUSH: 3.0000 mL | Freq: Two times a day (BID) | INTRAVENOUS | 0 refills | Status: AC

## 2019-12-18 MED ORDER — OXYCODONE HCL 5 MG PO TABS: 5.0000 mg | ORAL_TABLET | Freq: Four times a day (QID) | ORAL | 0 refills | Status: DC | PRN

## 2019-12-18 MED ORDER — AMOXICILLIN 250 MG PO CAPS: 250.0000 mg | ORAL_CAPSULE | Freq: Two times a day (BID) | ORAL | 0 refills | Status: AC

## 2019-12-18 MED ORDER — ONDANSETRON HCL 4 MG PO TABS: 4.0000 mg | ORAL_TABLET | Freq: Three times a day (TID) | ORAL | 0 refills | Status: AC | PRN

## 2019-12-18 MED ORDER — SENNOSIDES-DOCUSATE SODIUM 8.6-50 MG PO TABS: 1.0000 | ORAL_TABLET | Freq: Every evening | ORAL | 0 refills | Status: AC | PRN

## 2019-12-18 MED ORDER — MAGNESIUM SULFATE 4 GM/100ML IV SOLN
4.0000 g | Freq: Once | INTRAVENOUS | Status: AC
  Administered 2019-12-18: 4 g via INTRAVENOUS
  Filled 2019-12-18: qty 100

## 2019-12-18 MED ORDER — MAGNESIUM OXIDE 400 MG PO CAPS: 400.0000 mg | ORAL_CAPSULE | Freq: Every morning | ORAL | 0 refills | Status: AC

## 2019-12-18 NOTE — Discharge Summary (Signed)
[facility] NAME: Regina Harris    MR#:  [id number]  DATE OF BIRTH:  February 18, 1936  DATE OF ADMISSION:  12/15/2019 ADMITTING PHYSICIAN: Lloyd Huger, MD  DATE OF DISCHARGE: 12/18/2019  PRIMARY CARE PHYSICIAN: Tower, Wynelle Fanny, MD    ADMISSION DIAGNOSIS:  Kappa light chain myeloma ([place]) [C90.00]  DISCHARGE DIAGNOSIS:  Principal Problem:   Kappa light chain myeloma ([place]) Active Problems:   Essential hypertension   Hypothyroidism   AKI (acute kidney injury) ([place])   SECONDARY DIAGNOSIS:   Past Medical History:  Diagnosis Date  . Barrett esophagus   . Carotid bruit   . Diabetes mellitus without complication ([place])   . Esophagitis   . Gastritis 2013  . GERD (gastroesophageal reflux disease)   . HH (hiatus hernia)   . History of colonic polyps   . History of repair of right rotator cuff   . Hyperlipidemia   . Hypertension   . Hyperthyroidism 06/09/2018   Per [facility] RAI Therapy for Hyperthyroidism order  . Lump or mass in breast   . Pneumonia   . Polycythemia, secondary   . Tobacco abuse     HOSPITAL COURSE:   1.  Kappa light chain myeloma with retroperitoneal malignancy.  Patient will follow up with Dr. Grayland Ormond as outpatient.  Bone survey did not reveal any significant bony pathology.  Bone marrow biopsy is pending.  Follow-up with Dr. Grayland Ormond as outpatient.  Follow-up with palliative care as outpatient.  Patient is a DO NOT RESUSCITATE. 2.  Acute kidney injury now with chronic kidney disease stage IV.  Patient had a left nephrostomy tube.  Patient is on amoxicillin for another few days for urine infection.  Patient will follow up with urology as outpatient. 3.  Left-sided pleural effusion patient had an ultrasound-guided thoracentesis on 2 6 which removed 650 mL of pleural fluid.  Cytology showed malignant cells. 4.  UTI.  Urine culture grew out Enterococcus.  Patient was on amoxicillin. 5.  Essential hypertension.  Blood  pressure stable off medication 6.  Hypothyroidism unspecified on Synthroid 7.  Hyperlipidemia unspecified on atorvastatin 8.  Weakness and deconditioning.  Continue PT, OT, aide and RN evaluations as outpatient 9.  Mild cognitive impairment on Aricept. 10.  Outpatient palliative care follow-up.  Patient is a DO NOT RESUSCITATE  DISCHARGE CONDITIONS:   Fair  CONSULTS OBTAINED:  Treatment Team:  Lloyd Huger, MD Amelia Jo, MD  DRUG ALLERGIES:   Allergies  Allergen Reactions  . Benadryl [Diphenhydramine] Other (See Comments)    Hallucinations; family request to not give this medication.    DISCHARGE MEDICATIONS:   Allergies as of 12/18/2019      Reactions   Benadryl [diphenhydramine] Other (See Comments)   Hallucinations; family request to not give this medication.      Medication List    STOP taking these medications   Calcium 1000 + D 1000-800 MG-UNIT Tabs Generic drug: Calcium Carb-Cholecalciferol   sodium bicarbonate 650 MG tablet   traMADol 50 MG tablet Commonly known as: ULTRAM     TAKE these medications   acetaminophen 500 MG tablet Commonly known as: TYLENOL Take 2 tablets (1,000 mg total) by mouth 3 (three) times daily.   amoxicillin 250 MG capsule Commonly known as: AMOXIL Take 1 capsule (250 mg total) by mouth every 12 (twelve) hours for 4 days.   aspirin EC 81 MG tablet Take 81 mg by mouth daily.   atorvastatin 10 MG  tablet Commonly known as: LIPITOR TAKE 1 TABLET BY MOUTH ONCE DAILY   cyanocobalamin 1000 MCG tablet Take 1,000 mcg by mouth daily.   donepezil 5 MG tablet Commonly known as: ARICEPT TAKE 1 TABLET BY MOUTH AT BEDTIME   gabapentin 400 MG capsule Commonly known as: NEURONTIN TAKE 1 CAPSULE BY MOUTH TWICE DAILY   hydrocortisone 2.5 % rectal cream Commonly known as: ANUSOL-HC One application twice a day (can substitute generic)   levothyroxine 75 MCG tablet Commonly known as: SYNTHROID Take 75 mcg by mouth daily  with breakfast.   Magnesium Oxide 400 MG Caps Take 1 capsule (400 mg total) by mouth every morning.   megestrol 40 MG tablet Commonly known as: MEGACE Take 1 tablet (40 mg total) by mouth daily.   omeprazole 20 MG capsule Commonly known as: PRILOSEC TAKE 1 CAPSULE BY MOUTH TWICE DAILY   ondansetron 4 MG tablet Commonly known as: ZOFRAN Take 1 tablet (4 mg total) by mouth every 8 (eight) hours as needed for nausea (not responsive to prochlorperazine (COMPAZINE)).   oxyCODONE 5 MG immediate release tablet Commonly known as: Oxy IR/ROXICODONE Take 1 tablet (5 mg total) by mouth every 6 (six) hours as needed for severe pain.   senna-docusate 8.6-50 MG tablet Commonly known as: Senokot-S Take 1 tablet by mouth at bedtime as needed for moderate constipation.   sodium chloride flush 0.9 % Soln Commonly known as: NS Inject 3 mLs into the vein every 12 (twelve) hours.            Durable Medical Equipment  (From admission, onward)         Start     Ordered   12/17/19 1609  For home use only DME Hospital bed  Once    Question Answer Comment  Length of Need Lifetime   Patient has (list medical condition): multiple myeloma with bone mets   The above medical condition requires: Patient requires the ability to reposition frequently   Bed type Semi-electric   Support Surface: Gel Overlay      12/17/19 1608   12/17/19 1609  For home use only DME lightweight manual wheelchair with seat cushion  Once    Comments: Patient suffers from multiple myeloma which impairs their ability to perform daily activities like bathing, dressing, feeding, grooming and toileting in the home.  A cane, crutch or walker will not resolve  issue with performing activities of daily living. A wheelchair will allow patient to safely perform daily activities. Patient is not able to propel themselves in the home using a standard weight wheelchair due to arm weakness, endurance and general weakness. Patient can  self propel in the lightweight wheelchair. Length of need Lifetime. Accessories: elevating leg rests (ELRs), wheel locks, extensions and anti-tippers. Back cushion   12/17/19 1609   12/17/19 1102  For home use only DME Other see comment  Once    Comments: Harrel Lemon lift  Question:  Length of Need  Answer:  Lifetime   12/17/19 1102           DISCHARGE INSTRUCTIONS:   Follow-up with Dr. Grayland Ormond next week Follow-up PMD 5 days  If you experience worsening of your admission symptoms, develop shortness of breath, life threatening emergency, suicidal or homicidal thoughts you must seek medical attention immediately by calling 911 or calling your MD immediately  if symptoms less severe.  You Must read complete instructions/literature along with all the possible adverse reactions/side effects for all the Medicines you take and that have  been prescribed to you. Take any new Medicines after you have completely understood and accept all the possible adverse reactions/side effects.   Please note  You were cared for by a hospitalist during your hospital stay. If you have any questions about your discharge medications or the care you received while you were in the hospital after you are discharged, you can call the unit and asked to speak with the hospitalist on call if the hospitalist that took care of you is not available. Once you are discharged, your primary care physician will handle any further medical issues. Please note that NO REFILLS for any discharge medications will be authorized once you are discharged, as it is imperative that you return to your primary care physician (or establish a relationship with a primary care physician if you do not have one) for your aftercare needs so that they can reassess your need for medications and monitor your lab values.    Today   CHIEF COMPLAINT:  No chief complaint on file.   HISTORY OF PRESENT ILLNESS:  Regina Harris  is a 84 y.o. female    VITAL  SIGNS:  Blood pressure 126/60, pulse 74, temperature 97.9 F (36.6 C), resp. rate 18, height 5' 7" (1.702 m), weight 92 kg, SpO2 100 %.  I/O:    Intake/Output Summary (Last 24 hours) at 12/18/2019 1721 Last data filed at 12/18/2019 1300 Gross per 24 hour  Intake 360 ml  Output 825 ml  Net -465 ml    PHYSICAL EXAMINATION:  GENERAL:  84 y.o.-year-old patient lying in the bed with no acute distress.  EYES: Pupils equal, round, reactive to light and accommodation. No scleral icterus. Extraocular muscles intact.  HEENT: Head atraumatic, normocephalic. Oropharynx and nasopharynx clear.  NECK:  Supple, no jugular venous distention. No thyroid enlargement, no tenderness.  LUNGS: Normal breath sounds bilaterally, no wheezing, rales,rhonchi or crepitation. No use of accessory muscles of respiration.  CARDIOVASCULAR: S1, S2 normal. No murmurs, rubs, or gallops.  ABDOMEN: Soft, non-tender, non-distended. Bowel sounds present. No organomegaly or mass.  EXTREMITIES: No pedal edema, cyanosis, or clubbing.  NEUROLOGIC: Cranial nerves II through XII are intact. Muscle strength 5/5 in all extremities. Sensation intact. Gait not checked.  PSYCHIATRIC: The patient is alert and oriented x 3.  SKIN: No obvious rash, lesion, or ulcer.   DATA REVIEW:   CBC Recent Labs  Lab 12/18/19 0608  WBC 5.6  HGB 11.6*  HCT 36.1  PLT 176    Chemistries  Recent Labs  Lab 12/18/19 0608  NA 136  K 3.5  CL 104  CO2 25  GLUCOSE 88  BUN 35*  CREATININE 2.01*  CALCIUM 8.5*  MG 1.2*  AST 14*  ALT 9  ALKPHOS 38  BILITOT 0.4    Cardiac Enzymes No results for input(s): TROPONINI in the last 168 hours.  Microbiology Results  Results for orders placed or performed during the hospital encounter of 12/15/19  CULTURE, BLOOD (ROUTINE X 2) w Reflex to ID Panel     Status: None (Preliminary result)   Collection Time: 12/17/19 10:06 PM   Specimen: BLOOD  Result Value Ref Range Status   Specimen Description  BLOOD LEFT ASSIST CONTROL  Final   Special Requests   Final    BOTTLES DRAWN AEROBIC AND ANAEROBIC Blood Culture adequate volume   Culture   Final    NO GROWTH < 12 HOURS Performed at [facility], [address] [place], [place] [address]  Report Status PENDING  Incomplete  CULTURE, BLOOD (ROUTINE X 2) w Reflex to ID Panel     Status: None (Preliminary result)   Collection Time: 12/17/19 10:06 PM   Specimen: BLOOD  Result Value Ref Range Status   Specimen Description BLOOD LEFT HAND  Final   Special Requests   Final    BOTTLES DRAWN AEROBIC AND ANAEROBIC Blood Culture results may not be optimal due to an inadequate volume of blood received in culture bottles   Culture   Final    NO GROWTH < 12 HOURS Performed at [facility], [address] [place], [place] [address]    Report Status PENDING  Incomplete    RADIOLOGY:  CT ABDOMEN PELVIS WO CONTRAST  Result Date: 12/18/2019 CLINICAL DATA:  Lymphoma.  Hydronephrosis.  Follow-up. EXAM: CT ABDOMEN AND PELVIS WITHOUT CONTRAST TECHNIQUE: Multidetector CT imaging of the abdomen and pelvis was performed following the standard protocol without IV contrast. COMPARISON:  12/12/2019 FINDINGS: Lower chest: Bilateral effusions are larger. Associated dependent pulmonary atelectasis. Non dependent lung is clear. Retrocrural tumor mass appears the same as it did 6 days ago. Majority encasement of the aorta again demonstrated. Hepatobiliary: No liver parenchymal lesion is evident. Previous cholecystectomy. Pancreas: Negative Spleen: Negative Adrenals/Urinary Tract: Left adrenal gland again appears normal. Right adrenal gland is inseparable from a retroperitoneal mass in this region. Left kidney shows a double-J ureteral stent in place as well as a newly placed nephrostomy tube. No complications seen relative to the nephrostomy placement. Right hydronephrosis is again seen, similar in appearance. Tumor infiltration of the right renal  hilar region suspected. Bladder appears normal. Double-J ureteral stent on the left enters the bladder. Stomach/Bowel: No evidence of bowel obstruction. Mild edema adjacent to the proximal sigmoid colon is slightly less evident but could indicate mild resolving diverticulitis. No other focal bowel finding. Vascular/Lymphatic: Aortic atherosclerosis as seen previously. Retroperitoneal nodal masses as seen previously, including left-sided retroperitoneal tumor partially encasing or encasing the left ureter and stent. Reproductive: Previous hysterectomy. Other: No free fluid or air. Musculoskeletal: Subacute fractures of the spine as seen previously. No new bone finding. IMPRESSION: Since the previous study, bilateral effusions are slightly larger. Associated dependent pulmonary atelectasis. Interval placement of left nephrostomy tube. No complications seen relative to that. Double-J ureteral stent remains in place on the left. Continued evidence right renal obstruction due to infiltrating tumor. 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 No appreciable change over the last 6 days. Redemonstration of mild edema adjacent to the proximal descending colon which could indicate mild diverticulitis. No progressive change. Multiple lumbar compression fractures not visibly progressed. Electronically Signed   By: Nelson Chimes M.D.   On: 12/18/2019 01:38   CT HEAD WO CONTRAST  Result Date: 12/18/2019 CLINICAL DATA:  Mental status change.  CNS infection suspected. EXAM: CT HEAD WITHOUT CONTRAST TECHNIQUE: Contiguous axial images were obtained from the base of the skull through the vertex without intravenous contrast. COMPARISON:  12/12/2019 FINDINGS: Brain: No change. Mild age related volume loss. Mild chronic small-vessel change of the white matter. No sign of recent infarction, mass lesion, hemorrhage, hydrocephalus or extra-axial  collection. Vascular: There is atherosclerotic calcification of the major vessels at the base of the brain. Skull: Negative Sinuses/Orbits: Clear/normal Other: None IMPRESSION: Atrophy and chronic small-vessel ischemic change as seen previously. No acute finding by CT. Electronically Signed   By: Jan Fireman.D.  On: 12/18/2019 01:28   DG Bone Survey Met  Result Date: 12/17/2019 CLINICAL DATA:  Kappa light chain myeloma EXAM: METASTATIC BONE SURVEY COMPARISON:  PET-CT from 12/07/2019 FINDINGS: Lateral view of the skull reveals no lytic or sclerotic lesions. Degenerative changes of the cervicothoracic and lumbar spine are noted without evidence of lytic or sclerotic lesions. No new compression deformities are seen. Chronic L3 compression deformity is noted. Upper extremities demonstrate degenerative changes of the shoulder joints bilaterally. No lytic or sclerotic lesions are seen. Pelvis demonstrates degenerative changes of the hip joints bilaterally. Known left sacral lesion is not well appreciated on this exam. Left nephrostomy and left ureteral stent are seen. Lower extremities demonstrate degenerative changes of the hip and knee joints bilaterally. No lytic or sclerotic lesions are seen. The ribs demonstrate no definitive lesions although areas of increased activity were noted on prior PET-CT. IMPRESSION: No definitive lytic or sclerotic lesions. Electronically Signed   By: Inez Catalina M.D.   On: 12/17/2019 10:42     Management plans discussed with the patient, and she is in agreement.  Left message for family.  CODE STATUS:     Code Status Orders  (From admission, onward)         Start     Ordered   12/15/19 1949  Do not attempt resuscitation (DNR)  Continuous    Question Answer Comment  In the event of cardiac or respiratory ARREST Do not call a "code blue"   In the event of cardiac or respiratory ARREST Do not perform Intubation, CPR, defibrillation or ACLS   In the event of cardiac  or respiratory ARREST Use medication by any route, position, wound care, and other measures to relive pain and suffering. May use oxygen, suction and manual treatment of airway obstruction as needed for comfort.      12/15/19 1950        Code Status History    Date Active Date Inactive Code Status Order ID Comments User Context   12/15/2019 1600 12/15/2019 1950 DNR [id number]  Lloyd Huger, MD Inpatient   12/13/2019 1155 12/15/2019 1305 DNR [id number]  Danna Hefty, DO Inpatient   12/12/2019 0509 12/13/2019 1155 Full Code [id number]  Etta Quill, DO ED   11/17/2019 1948 11/20/2019 2143 Full Code [id number]  Clance Boll, MD ED   07/29/2019 2029 08/06/2019 1605 Full Code [id number]  Phillips Grout, MD Inpatient   Advance Care Planning Activity    Advance Directive Documentation     Most Recent Value  Type of Advance Directive  Healthcare Power of [place], Living will  Pre-existing out of facility DNR order (yellow form or pink MOST form)  --  "MOST" Form in Place?  --      TOTAL TIME TAKING CARE OF THIS PATIENT: 35 minutes.    Loletha Grayer M.D on 12/18/2019 at 5:21 PM  Between 7am to 6pm - Pager - [phone]  After 6pm go to [email or web address] - password [email or web address]  [facility]  CC: Primary care physician; Tower, Wynelle Fanny, MD

## 2019-12-18 NOTE — Progress Notes (Signed)
Occupational Therapy Treatment Patient Details Name: Regina Harris MRN: [id number] DOB: [birth year] Today's Date: 12/18/2019    History of present illness Regina Harris is an 77yoF who comes to [facility] on 12/11/19 s/p fall and progressive weakness at home. 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 Workup significant for AKI with creat of 1.7 and L hydronephrosis secondary to retro peritoneal mass. 1/14 she underwent cystoscopy with L ureteral stent placement. 1/22 e coli UTI. Pt seen in office on 2/4 d/t decline in function, renal US ordered on 2/5 which demonstrates B hydro nephrosis R>L. Urology ordering imaging from IR resultant "Lasix renogram showing 82% left-sided differential function with high-grade obstruction bilaterally." Urology recommendations for left-sided nephrostomy tube placement. She was also noted to have moderate to large left-sided pleural effusion and underwent ultrasound-guided thoracentesis on 12/12/2019 with removal of 650 mL of pleural fluid. Pt DC from [facility] transfer to inpatient bed [facility] on 2/9 for commencement of chemortherapy. Pt had first chemotherapy 2/10. DTR reports pt had COVID in Sept, sent home on O2, since weened.   OT comments  Pt seen for OT treatment this date to f/u re: safety with ADL mobility/transfers and to provide education re: considerations for safety at home. Pt's granddaughter, [name], is present throughout and very involved in pt's care. Pt does demo some improvement with bed mobility tolerance and ADL transfer status this date. Pt requires MOD A +2 for sup<>sit transition, demos POOR EOB sitting balance requiring from MIN A to MAX A primarily, to maintain static sitting balance (pt's waxing/waning attention to task impacting sitting balance). Pt tolerates EOB sitting ~17 minutes while participating in sitting postural exercises as well as attempting to sit without assistance for 1 minute with success through cuing for UE placement to support. Pt  participates in one sit<>stand with MIN/MOD A arm in arm +2 to RW with POOR static standing balance demo'ing posterior lean with calves bracing on side of bed throughout. Pt tolerates ~20 seconds before somewhat falling back to bed and not controlling descent from stand to sit. Overall, from therapy standpoint, SNF would still be best d/c disposition. However, family is planning to take pt home upon discharge and utilizing resources as needed for equipment, transportation, etc. HHOT f/u outside of acute setting would still be beneficial to improve/maintain ADL  Independence and safety with ADL mobility as tolerable.     Follow Up Recommendations  Home health OT;Supervision/Assistance - 24 hour(family not agreeable to facility placement at this time)    [facility] bed;3 in 1 bedside commode(lightweight wheelchair.)    Recommendations for Other Services      Precautions / Restrictions Precautions Precautions: Fall Precaution Comments: L nephrostomy tube Restrictions Weight Bearing Restrictions: No       Mobility Bed Mobility Overal bed mobility: Needs Assistance Bed Mobility: Supine to Sit;Sit to Supine     Supine to sit: Mod assist;+2 for physical assistance Sit to supine: Mod assist;+2 for physical assistance      Transfers Overall transfer level: Needs assistance Equipment used: Rolling walker (2 wheeled) Transfers: Sit to/from Stand Sit to Stand: Min assist;+2 physical assistance;From elevated surface         General transfer comment: pt toelrates one sit<>stand trial to/from elevated EOB. Arm in arm assist with bilateral blocking of feet from sliding to power up. Once up, requires MIN/MOD A +2 to maintain static stand d/t posterior lean.    Balance Overall balance assessment: Needs assistance Sitting-balance support: Single extremity supported;Feet supported  Sitting balance-Leahy Scale: Fair Sitting balance - Comments: able to sustain static  balnce with b/l UE support for 1 min during ~17 min EOB sitting period, otherwise waxes and wanes with attention to task from MIN to MAX A with static sitting balance. Postural control: Posterior lean Standing balance support: Bilateral upper extremity supported Standing balance-Leahy Scale: Poor Standing balance comment: requires MIN/MOD A +2 arm in arm to sustain static standing balance, posterior leans with calves bracing on bed throughout. Does not control descent back to EOB.                           ADL either performed or assessed with clinical judgement   ADL Overall ADL's : Needs assistance/impaired                                             Vision Patient Visual Report: No change from baseline     Perception     Praxis      Cognition Arousal/Alertness: Awake/alert Behavior During Therapy: WFL for tasks assessed/performed Overall Cognitive Status: History of cognitive impairments - at baseline                                 General Comments: Pt follows one to two step commands well with MIN verbal/tactile cues. Some cues to sustain attention required t/o session. Pt requires some increased time for processing, is oriented to person and place-hospital, but not specifics of location or time.        Exercises Other Exercises Other Exercises: OT facilitates education with granddaughter who is present-[name]-re: discharge to home considerations, including potential impact of transfer status on transportation to outpt chemo appointments once weekly (to/from w/c, to/from car, etc) as family is very much wanting to take pt home. In addition, and outside of treatment session, OT consults with case mgt re: family's concerns about transport. Other Exercises: OT engages pt in EOB sitting postural extension exercise to increase surface area for deeper breathing, for 1 set x10 reps with attempt to encourage 1-2 second hold per rep, pt  moderately able to follow cues to participate, but often limited by resuming POOR sitting balance and posteriorly leaning.   Shoulder Instructions       General Comments      Pertinent Vitals/ Pain       Pain Assessment: Faces Faces Pain Scale: Hurts a little bit Pain Location: L flank Pain Descriptors / Indicators: Tender Pain Intervention(s): Limited activity within patient's tolerance;Monitored during session  Home Living                                          Prior Functioning/Environment              Frequency  Min 2X/week        Progress Toward Goals  OT Goals(current goals can now be found in the care plan section)  Progress towards OT goals: Progressing toward goals  Acute Rehab OT Goals Patient Stated Goal: go home OT Goal Formulation: With patient/family Time For Goal Achievement: 12/28/19 Potential to Achieve Goals: [place] Discharge plan needs to be updated;Frequency remains appropriate  Co-evaluation                 AM-PAC OT "6 Clicks" Daily Activity     Outcome Measure   Help from another person eating meals?: A Little Help from another person taking care of personal grooming?: A Little Help from another person toileting, which includes using toliet, bedpan, or urinal?: Total Help from another person bathing (including washing, rinsing, drying)?: A Lot Help from another person to put on and taking off regular upper body clothing?: A Lot Help from another person to put on and taking off regular lower body clothing?: Total 6 Click Score: 12    End of Session Equipment Utilized During Treatment: Gait belt;Rolling walker  OT Visit Diagnosis: Unsteadiness on feet (R26.81);Other abnormalities of gait and mobility (R26.89);Muscle weakness (generalized) (M62.81);History of falling (Z91.81);Pain;Other symptoms and signs involving cognitive function Pain - Right/Left: Left   Activity Tolerance Patient limited by  fatigue   Patient Left in bed;with call bell/phone within reach;with family/visitor present;with bed alarm set(granddaughter-Chrystal)   Nurse Communication          Time: 4591-3685 OT Time Calculation (min): 40 min  Charges: OT General Charges $OT Visit: 1 Visit OT Treatments $Self Care/Home Management : 8-22 mins $Therapeutic Activity: 23-37 mins  Gerrianne Scale, [place], OTR/L ascom [phone] 12/18/19, 10:53 AM

## 2019-12-18 NOTE — Progress Notes (Signed)
Nurse reported patient with increased left lower quadrant abdominal pain and left hip pain along with slight altered mental state/confusion and decreased urine output. Noted patient was newly started fluid restriction to explain decreased output.  Secondary to patient's present illness and concern for obstruction or dislodged ureteral stent, DT of abdomen done and resulted. Summary results IMPRESSION: Since the previous study, bilateral effusions are slightly larger. Associated dependent pulmonary atelectasis.  Interval placement of left nephrostomy tube. No complications seen relative to that. Double-J ureteral stent remains in place on the left.  Continued evidence right renal obstruction due to infiltrating tumor.  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 No appreciable change over the last 6 days.  Redemonstration of mild edema adjacent to the proximal descending colon which could indicate mild diverticulitis. No progressive change.  Multiple lumbar compression fractures not visibly progressed.  CT head also done, and no acute findings reported.  Given location of pain, diverticulitis may be source of pain.  Will defer to attending on course of further eval and/or treatment given her current plans with chemo

## 2019-12-18 NOTE — TOC Transition Note (Signed)
Transition of Care [facility]) - CM/SW Discharge Note   Patient Details  Name: Regina Harris MRN: [id number] Date of Birth: 1936/01/16  [facility] CM/SW Contact:  Elease Hashimoto, LCSW Phone Number: 12/18/2019, 11:20 AM   Clinical Narrative:     Met with [name]-granddaughter who is here, Mom-pt's daughter-Carolyn waiting at home for equipment to be delivered-hospital bed and wheelchair. Pt to go non-emergency ambulance home. Have given [name] transportation resources for follow up appointments. She will follow up with them. Let bedside RN know of plan and packet in chart. Pt to be followed at home by Palliative also. Pt has a private caregiver also. All aware she will need 24 hr physical care at home. Pt wants to get home and be comfortable. No further follow due to DC today.    Final next level of care: Home w Home Health Services Barriers to Discharge: Barriers Resolved   Patient Goals and CMS Choice Patient states their goals for this hospitalization and ongoing recovery are:: Hope this chemo works, then go from there      Discharge Placement                  Name of family member notified: Research scientist (physical sciences) and [name]-granddaughter Patient and family notified of of transfer: 12/18/19  Discharge Plan and Services In-house Referral: Clinical Social Work              DME Arranged: Gel overlay, Hospital bed, Lightweight manual wheelchair with seat cushion DME Agency: [employer] Date DME Agency Contacted: 12/17/19 Time DME Agency Contacted: 1500 Representative spoke with at DME Agency: [place]: RN, PT, OT, Nurse's Aide [place] Agency: [place] ([place]) Date [place]: 12/17/19 Time [place]: 1600 Representative spoke with at [place]: [place] ([place]) Interventions     Readmission Risk Interventions No flowsheet data found.

## 2019-12-18 NOTE — Discharge Instructions (Signed)
Multiple Myeloma  Multiple myeloma is a form of cancer. It develops when abnormal plasma cells grow out of control. Plasma cells are a type of white blood cell that is made in the soft tissue inside the bones (bone marrow). They are part of the body's disease-fighting system (immune system). Multiple myeloma damages bones and causes other health problems because of its effect on blood cells. Abnormal plasma cells produce monoclonal proteins (M proteins) and interfere with many important functions that normal cells perform in the body. The disease gets worse over time (progresses) and reduces the body's ability to fight infections. What are the causes? The cause of multiple myeloma is not known. What increases the risk? You are more likely to develop this condition if you:  Are older than age 65.  Are female.  Are African American.  Have a family history of multiple myeloma.  Have a history of monoclonal gammopathy of undetermined significance (MGUS).  Have a history of radiation exposure.  Have been exposed to certain chemicals, such as benzene or pesticides. What are the signs or symptoms? Signs and symptoms of multiple myeloma may include:  Bone pain, especially in the back, ribs, and hips.  Broken bones (fractures).  Having a low level of red blood cells (anemia), white blood cells (leukopenia), and platelets (thrombocytopenia). Platelets are cells that help blood to clot so a wound does not keep bleeding.  Fatigue.  Weakness.  Infections.  Unusual bleeding, such as: ? Bleeding from the nose or gums. ? Bleeding a lot from a small scrape or cut.  High blood calcium levels.  Increased urination.  Confusion.  Shortness of breath.  Weakness or numbness in your legs.  Sudden, severe back pain. How is this diagnosed? This condition is diagnosed based on your symptoms, your medical history, and a physical exam. You will have blood and urine tests to confirm that M  proteins are present. You may also have other tests, including:  Additional blood tests.  X-rays.  MRI.  CT scan.  PET scan.  Tests to check the function of your kidneys.  Heart tests, such as an echocardiogram. An echocardiogram uses sound waves to produce an image of the heart.  A procedure to remove a sample of bone marrow (bone marrow biopsy). The sample is examined for abnormal plasma cells. How is this treated? There is no cure for multiple myeloma. However, treatments can manage symptoms and slow the progression of the disease. Treatment options may vary depending on how much the disease has advanced. Possible treatment options may include:  Medicines that kill cancer cells (chemotherapy).  Radiation therapy. This is the use of high-energy rays to kill cancer cells.  A bone marrow transplant. This procedure replaces diseased bone marrow with healthy bone marrow (stem cell transplant).  Medicines that block the growth and spread of cancer cells (targeted drug therapy).  Medicines that strengthen your immune system's ability to fight cancer cells (immunotherapy or biologic therapy).  Participating in clinical trials to find out if new (experimental) treatments are effective.  Medicines that help to prevent bone damage (bisphosphonates).  Medicines that reduce swelling (corticosteroids).  Surgery to repair bone damage.  A procedure to remove plasma cells from your blood (plasmapheresis).  Other medicines to treat problems such as infections or pain. Follow these instructions at home:  Eating and drinking  Drink enough fluid to keep your urine pale yellow.  Try to eat healthy meals on a regular basis. Some of your treatments might affect your   appetite. If you are having problems eating or if you do not have an appetite, meet with a diet and nutrition specialist (dietitian).  Take vitamins or supplements only as told by your health care provider or dietitian. Some  vitamins and supplements may interfere with how well your treatment works. General instructions  Take over-the-counter and prescription medicines only as told by your health care provider.  Stay active. Talk with your health care provider about what types of exercises and activities are safe for you. ? Avoid activities that cause increased pain. ? Do not lift anything that is heavier than 10 lb (4.5 kg), or the limit that you are told, until your health care provider says that it is safe.  Consider joining a support group or getting counseling to help you cope with the stress of having multiple myeloma.  Keep all follow-up visits as told by your health care provider. This is important. Where to find more information  American Cancer Society: www.cancer.org  Leukemia and Lymphoma Society: www.LLS.org  National Cancer Institute (NCI): www.cancer.gov Contact a health care provider if you:  Have pain that gets worse or does not get better with medicine.  Have a fever.  Have swollen legs.  Have weakness or dizziness.  Have unexplained weight loss.  Have unexplained bleeding or bruising.  Have a cough or symptoms of the common cold.  Feel depressed.  Have changes in urination or bowel movements. Get help right away if you:  Have sudden severe pain, especially back pain.  Have numbness or weakness in your arms, hands, legs, or feet.  Become very confused.  Have weakness on one side of your body.  Have slurred speech.  Have trouble staying awake.  Have shortness of breath.  Have blood in your stool (feces) or urine.  Vomit blood or cough up blood. Summary  Multiple myeloma is a form of cancer. It develops when abnormal plasma cells grow out of control.  There is no cure for multiple myeloma. However, treatments can manage symptoms and slow the progression of the disease. Treatment options may vary depending on how much the disease has advanced.  Do not lift  anything that is heavier than 10 lb (4.5 kg), or the limit that you are told, until your health care provider says that it is safe.  Contact your health care provider if you have any new symptoms or sudden severe pain, especially back pain. This information is not intended to replace advice given to you by your health care provider. Make sure you discuss any questions you have with your health care provider. Document Revised: 10/04/2017 Document Reviewed: 08/21/2017 Elsevier Patient Education  2020 [employer]  

## 2019-12-18 NOTE — Care Management Important Message (Signed)
Important Message  Patient Details  Name: Regina Harris MRN: [id number] Date of Birth: 07-Oct-1936   Medicare Important Message Given:  Yes     Juliann Pulse A Cami Delawder 12/18/2019, 11:28 AM

## 2019-12-19 DIAGNOSIS — K5792 Diverticulitis of intestine, part unspecified, without perforation or abscess without bleeding: Secondary | ICD-10-CM | POA: Diagnosis not present

## 2019-12-19 DIAGNOSIS — N131 Hydronephrosis with ureteral stricture, not elsewhere classified: Secondary | ICD-10-CM | POA: Diagnosis not present

## 2019-12-19 DIAGNOSIS — Z48816 Encounter for surgical aftercare following surgery on the genitourinary system: Secondary | ICD-10-CM | POA: Diagnosis not present

## 2019-12-19 DIAGNOSIS — N179 Acute kidney failure, unspecified: Secondary | ICD-10-CM | POA: Diagnosis not present

## 2019-12-19 DIAGNOSIS — E119 Type 2 diabetes mellitus without complications: Secondary | ICD-10-CM | POA: Diagnosis not present

## 2019-12-19 DIAGNOSIS — I10 Essential (primary) hypertension: Secondary | ICD-10-CM | POA: Diagnosis not present

## 2019-12-19 NOTE — Progress Notes (Deleted)
[place]  Telephone:([phone]) [phone] Fax:(336) [phone]  ID: Regina Harris OB: [id number]  MR#: [id number]  YQI#:[id number]  Patient Care Team: Abner Greenspan, MD as PCP - General Byrnett, Forest Gleason, MD (General Surgery) Tobi Bastos, RN as [place] [facility]   CHIEF COMPLAINT: Kappa light chain myeloma.  INTERVAL HISTORY: Patient returns to clinic today for further evaluation and additional diagnostic planning.  She continues to have increased weakness and fatigue as well as a declining performance status.  She has a poor appetite.  She has chronic pain particularly in her left flank.  She has increased shortness of breath, but denies chest pain, cough, or hemoptysis.  She denies any nausea, vomiting, constipation, or diarrhea.  She has no further urinary complaints.  Patient offers no further specific complaints today.  REVIEW OF SYSTEMS:   Review of Systems  Constitutional: Positive for malaise/fatigue. Negative for fever and weight loss.  Respiratory: Negative.  Negative for cough, hemoptysis and shortness of breath.   Cardiovascular: Positive for leg swelling. Negative for chest pain.  Gastrointestinal: Negative for abdominal pain, blood in stool, diarrhea, melena and nausea.  Genitourinary: Positive for flank pain. Negative for dysuria.  Musculoskeletal: Negative for back pain.  Skin: Negative.  Negative for rash.  Neurological: Positive for weakness. Negative for dizziness, focal weakness and headaches.  Psychiatric/Behavioral: Negative.  The patient is not nervous/anxious.     As per HPI. Otherwise, a complete review of systems is negative.  PAST MEDICAL HISTORY: Past Medical History:  Diagnosis Date  . Barrett esophagus   . Carotid bruit   . Diabetes mellitus without complication ([place])   . Esophagitis   . Gastritis 2013  . GERD (gastroesophageal reflux disease)   . HH (hiatus hernia)   . History of colonic polyps   .  History of repair of right rotator cuff   . Hyperlipidemia   . Hypertension   . Hyperthyroidism 06/09/2018   Per [facility] RAI Therapy for Hyperthyroidism order  . Lump or mass in breast   . Pneumonia   . Polycythemia, secondary   . Tobacco abuse     PAST SURGICAL HISTORY: Past Surgical History:  Procedure Laterality Date  . ABDOMINAL HYSTERECTOMY    . APPENDECTOMY    . BREAST BIOPSY Right 1996  . BREAST BIOPSY Left 10/09/2012   Benign breast tissue with focal fat necrosis and focal periductal chronic inflammation.  Marland Kitchen BREAST BIOPSY Left 10/09/2012   Benign breast tissue with focal fat necrosis and focal periductal chronic inflammation.  Marland Kitchen CATARACT EXTRACTION  3/11   Dr Charise Killian  . CHOLECYSTECTOMY    . COLONOSCOPY  2009  . CYSTOSCOPY WITH URETEROSCOPY AND STENT PLACEMENT Left 11/19/2019   Procedure: CYSTOSCOPY WITH URETEROSCOPY AND STENT PLACEMENT;  Surgeon: Billey Co, MD;  Location: [facility] ORS;  Service: Urology;  Laterality: Left;  . ESOPHAGOGASTRODUODENOSCOPY (EGD) WITH PROPOFOL N/A 11/02/2015   Procedure: ESOPHAGOGASTRODUODENOSCOPY (EGD) WITH PROPOFOL;  Surgeon: Manya Silvas, MD;  Location: [facility] ENDOSCOPY;  Service: Endoscopy;  Laterality: N/A;  . IR NEPHROSTOMY PLACEMENT LEFT  12/13/2019  . SHOULDER ARTHROSCOPY WITH OPEN ROTATOR CUFF REPAIR Right 03/05/2016   Procedure: SHOULDER ARTHROSCOPY WITH OPEN ROTATOR CUFF REPAIR;  Surgeon: Earnestine Leys, MD;  Location: [facility] ORS;  Service: Orthopedics;  Laterality: Right;  . UPPER GI ENDOSCOPY  2013    FAMILY HISTORY: Family History  Problem Relation Age of Onset  . Kidney cancer Father   . Diabetes Mother   . Breast  cancer Neg Hx     ADVANCED DIRECTIVES (Y/N):  N  HEALTH MAINTENANCE: Social History   Tobacco Use  . Smoking status: Former Smoker    Packs/day: 1.00    Types: Cigarettes  . Smokeless tobacco: Never Used  Substance Use Topics  . Alcohol use: No    Alcohol/week: 0.0 standard drinks  . Drug use: No      Colonoscopy:  PAP:  Bone density:  Lipid panel:  Allergies  Allergen Reactions  . Benadryl [Diphenhydramine] Other (See Comments)    Hallucinations; family request to not give this medication.    Current Outpatient Medications  Medication Sig Dispense Refill  . acetaminophen (TYLENOL) 500 MG tablet Take 2 tablets (1,000 mg total) by mouth 3 (three) times daily. 30 tablet 0  . amoxicillin (AMOXIL) 250 MG capsule Take 1 capsule (250 mg total) by mouth every 12 (twelve) hours for 4 days. 7 capsule 0  . aspirin EC 81 MG tablet Take 81 mg by mouth daily.    Marland Kitchen atorvastatin (LIPITOR) 10 MG tablet TAKE 1 TABLET BY MOUTH ONCE DAILY (Patient taking differently: Take 10 mg by mouth daily. ) 90 tablet 1  . cyanocobalamin 1000 MCG tablet Take 1,000 mcg by mouth daily.    Marland Kitchen donepezil (ARICEPT) 5 MG tablet TAKE 1 TABLET BY MOUTH AT BEDTIME (Patient taking differently: Take 5 mg by mouth at bedtime. ) 30 tablet 5  . gabapentin (NEURONTIN) 400 MG capsule TAKE 1 CAPSULE BY MOUTH TWICE DAILY (Patient taking differently: Take 400 mg by mouth 2 (two) times daily. ) 180 capsule 1  . hydrocortisone (ANUSOL-HC) 2.5 % rectal cream One application twice a day (can substitute generic) 30 g 0  . levothyroxine (SYNTHROID) 75 MCG tablet Take 75 mcg by mouth daily with breakfast.     . Magnesium Oxide 400 MG CAPS Take 1 capsule (400 mg total) by mouth every morning. 30 capsule 0  . megestrol (MEGACE) 40 MG tablet Take 1 tablet (40 mg total) by mouth daily. 30 tablet 1  . omeprazole (PRILOSEC) 20 MG capsule TAKE 1 CAPSULE BY MOUTH TWICE DAILY (Patient taking differently: Take 20 mg by mouth 2 (two) times daily. ) 180 capsule 1  . ondansetron (ZOFRAN) 4 MG tablet Take 1 tablet (4 mg total) by mouth every 8 (eight) hours as needed for nausea (not responsive to prochlorperazine (COMPAZINE)). 20 tablet 0  . oxyCODONE (OXY IR/ROXICODONE) 5 MG immediate release tablet Take 1 tablet (5 mg total) by mouth every 6 (six)  hours as needed for severe pain. 12 tablet 0  . senna-docusate (SENOKOT-S) 8.6-50 MG tablet Take 1 tablet by mouth at bedtime as needed for moderate constipation. 30 tablet 0  . sodium chloride flush (NS) 0.9 % SOLN Inject 3 mLs into the vein every 12 (twelve) hours. 60 Syringe 0   No current facility-administered medications for this visit.   Facility-Administered Medications Ordered in Other Visits  Medication Dose Route Frequency Provider Last Rate Last Admin  . bortezomib SQ (VELCADE) chemo injection 3.25 mg  1.5 mg/m2 Subcutaneous Once Lloyd Huger, MD      . cyclophosphamide (CYTOXAN) 500 mg in sodium chloride 0.9 % 250 mL chemo infusion  232 mg/m2 Intravenous Once Lloyd Huger, MD      . dexamethasone (DECADRON) 20 mg in sodium chloride 0.9 % 50 mL IVPB  20 mg Intravenous Once Lloyd Huger, MD      . palonosetron (ALOXI) injection 0.25 mg  0.25 mg Intravenous  Once Lloyd Huger, MD        OBJECTIVE: There were no vitals filed for this visit.   There is no height or weight on file to calculate BMI.    ECOG FS:3 - Symptomatic, >50% confined to bed  General: Ill-appearing, no acute distress.  Sitting in a wheelchair. Eyes: Pink conjunctiva, anicteric sclera. HEENT: Normocephalic, moist mucous membranes. Lungs: No audible wheezing or coughing. Heart: Regular rate and rhythm. Abdomen: Soft, nontender, no obvious distention. Musculoskeletal: 1-2+ bilateral peripheral edema. Neuro: Alert. Cranial nerves grossly intact. Skin: No rashes or petechiae noted. Psych: Normal affect.  LAB RESULTS:  Lab Results  Component Value Date   NA 136 12/18/2019   K 3.5 12/18/2019   CL 104 12/18/2019   CO2 25 12/18/2019   GLUCOSE 88 12/18/2019   BUN 35 (H) 12/18/2019   CREATININE 2.01 (H) 12/18/2019   CALCIUM 8.5 (L) 12/18/2019   PROT 6.4 (L) 12/18/2019   ALBUMIN 2.4 (L) 12/18/2019   AST 14 (L) 12/18/2019   ALT 9 12/18/2019   ALKPHOS 38 12/18/2019   BILITOT 0.4  12/18/2019   GFRNONAA 23 (L) 12/18/2019   GFRAA 26 (L) 12/18/2019    Lab Results  Component Value Date   WBC 5.6 12/18/2019   NEUTROABS 3.2 12/18/2019   HGB 11.6 (L) 12/18/2019   HCT 36.1 12/18/2019   MCV 94.3 12/18/2019   PLT 176 12/18/2019     STUDIES: CT ABDOMEN PELVIS WO CONTRAST  Result Date: 12/18/2019 CLINICAL DATA:  Lymphoma.  Hydronephrosis.  Follow-up. EXAM: CT ABDOMEN AND PELVIS WITHOUT CONTRAST TECHNIQUE: Multidetector CT imaging of the abdomen and pelvis was performed following the standard protocol without IV contrast. COMPARISON:  12/12/2019 FINDINGS: Lower chest: Bilateral effusions are larger. Associated dependent pulmonary atelectasis. Non dependent lung is clear. Retrocrural tumor mass appears the same as it did 6 days ago. Majority encasement of the aorta again demonstrated. Hepatobiliary: No liver parenchymal lesion is evident. Previous cholecystectomy. Pancreas: Negative Spleen: Negative Adrenals/Urinary Tract: Left adrenal gland again appears normal. Right adrenal gland is inseparable from a retroperitoneal mass in this region. Left kidney shows a double-J ureteral stent in place as well as a newly placed nephrostomy tube. No complications seen relative to the nephrostomy placement. Right hydronephrosis is again seen, similar in appearance. Tumor infiltration of the right renal hilar region suspected. Bladder appears normal. Double-J ureteral stent on the left enters the bladder. Stomach/Bowel: No evidence of bowel obstruction. Mild edema adjacent to the proximal sigmoid colon is slightly less evident but could indicate mild resolving diverticulitis. No other focal bowel finding. Vascular/Lymphatic: Aortic atherosclerosis as seen previously. Retroperitoneal nodal masses as seen previously, including left-sided retroperitoneal tumor partially encasing or encasing the left ureter and stent. Reproductive: Previous hysterectomy. Other: No free fluid or air. Musculoskeletal:  Subacute fractures of the spine as seen previously. No new bone finding. IMPRESSION: Since the previous study, bilateral effusions are slightly larger. Associated dependent pulmonary atelectasis. Interval placement of left nephrostomy tube. No complications seen relative to that. Double-J ureteral stent remains in place on the left. Continued evidence right renal obstruction due to infiltrating tumor. 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 No appreciable change over the last 6 days. Redemonstration of mild edema adjacent to the proximal descending colon which could indicate mild diverticulitis. No progressive change. Multiple lumbar compression fractures not visibly progressed. Electronically Signed   By: Nelson Chimes M.D.   On:  12/18/2019 01:38   DG Chest 1 View  Result Date: 12/12/2019 CLINICAL DATA:  Status post LEFT thoracentesis. EXAM: CHEST  1 VIEW COMPARISON:  12/12/2019 FINDINGS: LEFT pleural effusion has decreased, now trace. LEFT basilar atelectasis has improved. A trace RIGHT pleural effusion may be present. There is no evidence of pneumothorax. IMPRESSION: Decreased LEFT pleural effusion, now trace, with improved LEFT basilar atelectasis. No evidence of pneumothorax. Electronically Signed   By: Margarette Canada M.D.   On: 12/12/2019 10:49   DG Knee 2 Views Left  Result Date: 12/12/2019 CLINICAL DATA:  Knee pain EXAM: LEFT KNEE - 1-2 VIEW COMPARISON:  None. FINDINGS: No fracture or dislocation. Tricompartmental osteoarthritis is noted with joint space loss and subchondral sclerosis. No large knee joint effusion. Patellar enthesopathy seen. Vascular calcifications are noted. IMPRESSION: No acute osseous abnormality. Electronically Signed   By: Prudencio Pair M.D.   On: 12/12/2019 03:35   DG Abd 1 View  Result Date: 12/11/2019 CLINICAL DATA:  Ureteral stent. Lower abdominal pain. EXAM: ABDOMEN - 1 VIEW  COMPARISON:  November 17, 2019. FINDINGS: The bowel gas pattern is normal. Status post cholecystectomy. Left ureteral stent is noted in grossly good position. Phleboliths are noted in the pelvis. No nephrolithiasis is noted. IMPRESSION: Left ureteral stent is noted in grossly good position. No evidence of bowel obstruction or ileus. Electronically Signed   By: Marijo Conception M.D.   On: 12/11/2019 16:30   CT HEAD WO CONTRAST  Result Date: 12/18/2019 CLINICAL DATA:  Mental status change.  CNS infection suspected. EXAM: CT HEAD WITHOUT CONTRAST TECHNIQUE: Contiguous axial images were obtained from the base of the skull through the vertex without intravenous contrast. COMPARISON:  12/12/2019 FINDINGS: Brain: No change. Mild age related volume loss. Mild chronic small-vessel change of the white matter. No sign of recent infarction, mass lesion, hemorrhage, hydrocephalus or extra-axial collection. Vascular: There is atherosclerotic calcification of the major vessels at the base of the brain. Skull: Negative Sinuses/Orbits: Clear/normal Other: None IMPRESSION: Atrophy and chronic small-vessel ischemic change as seen previously. No acute finding by CT. Electronically Signed   By: Nelson Chimes M.D.   On: 12/18/2019 01:28   CT Head Wo Contrast  Result Date: 12/12/2019 CLINICAL DATA:  Fall weakness EXAM: CT HEAD WITHOUT CONTRAST TECHNIQUE: Contiguous axial images were obtained from the base of the skull through the vertex without intravenous contrast. COMPARISON:  December 07, 2019 brain MRI FINDINGS: Brain: No evidence of acute territorial infarction, hemorrhage, hydrocephalus,extra-axial collection or mass lesion/mass effect. There is dilatation the ventricles and sulci consistent with age-related atrophy. Low-attenuation changes in the deep white matter consistent with small vessel ischemia. Vascular: No hyperdense vessel or unexpected calcification. Skull: The skull is intact. No fracture or focal lesion identified.  Sinuses/Orbits: The visualized paranasal sinuses and mastoid air cells are clear. The orbits and globes intact. Other: Small laceration seen over the posterior left parietal skull. IMPRESSION: No acute intracranial abnormality. Findings consistent with age related atrophy and chronic small vessel ischemia Electronically Signed   By: Prudencio Pair M.D.   On: 12/12/2019 00:45   CT GUIDED NEEDLE PLACEMENT  Result Date: 11/20/2019 INDICATION: No known primary, now with retroperitoneal mass worrisome for lymphoma. Please perform CT-guided biopsy for tissue diagnostic purposes. EXAM: CT GUIDANCE NEEDLE PLACEMENT COMPARISON:  CT abdomen and pelvis-09/16/2020 MEDICATIONS: None. ANESTHESIA/SEDATION: Fentanyl 50 mcg IV; Versed 1 mg IV Sedation time: 16 minutes; The patient was continuously monitored during the procedure by the interventional radiology nurse under my direct  supervision. CONTRAST:  None. COMPLICATIONS: None immediate. PROCEDURE: Informed consent was obtained from the patient following an explanation of the procedure, risks, benefits and alternatives. A time out was performed prior to the initiation of the procedure. The patient was positioned prone on the CT table and a limited CT was performed for procedural planning demonstrating unchanged size and appearance the left-sided retroperitoneal mass, presumed nodal conglomeration with dominant component measuring approximately 4.5 x 2.6 cm (image 21, series 3). The procedure was planned. The operative site was prepped and draped in the usual sterile fashion. Appropriate trajectory was confirmed with a 22 gauge spinal needle after the adjacent tissues were anesthetized with 1% Lidocaine with epinephrine. Under intermittent CT guidance, a 17 gauge coaxial needle was advanced into the peripheral aspect of the mass. Appropriate positioning was confirmed and 6 core needle biopsy samples were obtained with an 18 gauge core needle biopsy device. The co-axial needle  was removed following the administration of a Gel-Foam slurry and superficial hemostasis was achieved with manual compression. A limited postprocedural CT was negative for hemorrhage or additional complication. A dressing was placed. The patient tolerated the procedure well without immediate postprocedural complication. IMPRESSION: Technically successful CT guided core needle biopsy of left-sided retroperitoneal mass, presumed nodal conglomeration. Electronically Signed   By: Sandi Mariscal M.D.   On: 11/20/2019 09:43   MR Brain W Wo Contrast  Result Date: 12/07/2019 CLINICAL DATA:  Lymphoma. EXAM: MRI HEAD WITHOUT AND WITH CONTRAST TECHNIQUE: Multiplanar, multiecho pulse sequences of the brain and surrounding structures were obtained without and with intravenous contrast. CONTRAST:  18m GADAVIST GADOBUTROL 1 MMOL/ML IV SOLN COMPARISON:  Head CT 07/29/2019 FINDINGS: The study is moderately motion degraded. Brain: There is no evidence of acute infarct, intracranial hemorrhage, mass, midline shift, or extra-axial fluid collection. There is mild to moderate cerebral atrophy. T2 hyperintensities in the cerebral white matter bilaterally are nonspecific but compatible with mild chronic small vessel ischemic disease. No enhancing lesions are identified, however note that small lesions could be easily obscured by the degree of motion artifact. Vascular: Major intracranial vascular flow voids are preserved. Skull and upper cervical spine: No gross skull lesion identified. Sinuses/Orbits: Bilateral cataract extraction. Mild right sphenoid sinus mucosal thickening. Small left mastoid effusion. Other: None. IMPRESSION: 1. Motion degraded examination without evidence of intracranial metastases. 2. Mild chronic small vessel ischemic disease. Electronically Signed   By: ALogan BoresM.D.   On: 12/07/2019 15:52   NM Renal Imaging Flow W/O Pharm  Result Date: 12/14/2019 CLINICAL DATA:  Hydronephrosis, abnormal CT, LEFT renal  collecting system dilatation with prior stenting, RIGHT hydronephrosis with tumor invasion of RIGHT renal hilum, history lymphoma EXAM: NUCLEAR MEDICINE RENAL SCAN TECHNIQUE: Radionuclide angiographic and sequential renal images were obtained after intravenous injection of radiopharmaceutical. Lasix not administered due to new acute renal failure. RADIOPHARMACEUTICALS:  5.38 mCi Technetium-[id number] IV COMPARISON:  CT abdomen and pelvis 12/12/2019 FINDINGS: Blood flow: Poor tracer bolus. Probable delayed blood flow to both kidneys though this is suboptimally assessed. LEFT kidney: Delayed uptake, concentration and excretion of tracer by LEFT kidney. Central photopenia early in exam corresponds to mildly dilated collecting system. Delayed excretion of into the dilated collecting system. Significant retained tracer within the dilated collecting system and the renal cortex at the conclusion of the exam. Analysis of the renogram curve demonstrates a continually increasing curve, with delayed time to peak activity of 42.7 minutes. RIGHT kidney: Delayed and concentration of tracer by RIGHT kidney. The upper and mid portions  of the RIGHT kidney demonstrate no renal function, corresponding to marked hydronephrosis and tumor invasion on CT. No significant excretion of tracer into a dilated collecting system is seen over the course of the exam. No ureteral activity identified. Significant retained tracer at the inferior pole the RIGHT kidney at the conclusion of the study. Analysis of the renogram curve demonstrates a continually climbing curved peaking at the conclusion of the exam at 45.7 minutes. Differential renal function: LEFT: 82% RIGHT: 18% IMPRESSION: Absent function at the upper and mid RIGHT kidney corresponding to hydronephrosis and tumor invasion on CT. Markedly impaired BILATERAL renal function with significantly delayed uptake, concentration and excretion of tracer, evidence by continually increasing renogram  curves bilaterally. Asymmetric renal function, markedly greater on LEFT than RIGHT. Electronically Signed   By: Lavonia Dana M.D.   On: 12/14/2019 08:41   US RENAL  Result Date: 12/11/2019 CLINICAL DATA:  Retroperitoneal mass on CT, increasing creatinine EXAM: RENAL / URINARY TRACT ULTRASOUND COMPLETE COMPARISON:  CT abdomen and pelvis 11/18/2019 FINDINGS: Right Kidney: Renal measurements: 11.9 x 7.9 x 7.9 cm = volume: 788 mL. Cortical thinning. Normal cortical echogenicity. Mild-to-moderate RIGHT hydronephrosis. Cystic lesion at upper pole 7.2 x 5.9 x 5.7 cm, simple features. Additional peripelvic/mid RIGHT renal cyst 5.7 x 4.3 x 4.0 cm. No solid masses or shadowing calculi. Left Kidney: Renal measurements: 12.1 x 6.3 x 6.9 cm = volume: 273 mL. Cortical thinning. Mild hydronephrosis. Minimally increased cortical echogenicity. Small mass at upper pole 2.8 x 3.2 x 2.3 cm, by prior CT exam appears represent a splenic lobulation rather than a renal lesion. Small exophytic cyst at inferior pole 2.1 x 1.9 x 1.6 cm. No additional renal mass. Bladder: Stent in urinary bladder.  Bladder poorly distended. Other: Small LEFT pleural effusion. IMPRESSION: BILATERAL hydronephrosis, slightly greater on RIGHT. BILATERAL renal cysts larger on RIGHT. Small LEFT pleural effusion. Electronically Signed   By: Lavonia Dana M.D.   On: 12/11/2019 14:48   NM PET Image Initial (PI) Skull Base To Thigh  Result Date: 12/07/2019 CLINICAL DATA:  Initial treatment strategy for lymphoma. EXAM: NUCLEAR MEDICINE PET SKULL BASE TO THIGH TECHNIQUE: 11.8 mCi F-18 FDG was injected intravenously. Full-ring PET imaging was performed from the skull base to thigh after the radiotracer. CT data was obtained and used for attenuation correction and anatomic localization. Fasting blood glucose: 81 mg/dl COMPARISON:  CT chest abdomen and pelvis 11/18/2019 FINDINGS: Mediastinal blood pool activity: SUV max 2.6 Liver activity: SUV max 3.14. NECK: No  hypermetabolic lymph nodes in the neck. Incidental CT findings: none CHEST: Paraspinal soft tissue mass within the posterior mediastinum measures 7.3 x 6.9 cm with SUV max of 3.59. Masslike architectural distortion within the lingula is identified, nonspecific measuring 3.9 x 2.2 cm with SUV max 2.48. There are bilateral pleural effusions, left greater than right. Increased tracer uptake within the left pleural effusion has an SUV max of 2.25. Cannot exclude malignant effusion. Incidental CT findings: Aortic atherosclerosis. Lad, left circumflex and RCA coronary artery calcifications identified. ABDOMEN/PELVIS: Within the left retroperitoneum extending into the paraspinal region and involving the left psoas muscle. This measures 4.1 x 4.7 cm and has SUV max of 5.0. Multiple small FDG avid right retroperitoneal and right upper quadrant lymph nodes are identified. I suspect, there is asymmetric involvement of the right renal hilum with associated mild hydronephrosis. Difficulty to measure lymph nodes in this region individually. The SUV max within the right retroperitoneum is equal to 4.30. There is mild increased  radiotracer uptake within both adrenal glands are identified which appear mildly enlarged. SUV max within the left adrenal gland is equal to 3.38. SUV max associated with the right adrenal gland is equal to 4.67. Incidental CT findings: Cholecystectomy. Aortic atherosclerosis. Mild right hydronephrosis. Status post left nephroureteral stent placement with mild residual hydronephrosis. Multiple large right kidney cysts identified. Small volume of free fluid noted within the abdomen and pelvis. SKELETON: Multifocal abnormal areas of increased radiotracer uptake are identified involving the axial and appendicular skeleton compatible with osseous involvement by lymphoma. Asymmetric increased uptake within the proximal left humerus has an SUV max of 3.23. Asymmetric increased uptake localizing to the right side  of the sternal manubrium has an SUV max of 3.67. Underlying lucent bone lesion is identified measuring 1.4 cm. FDG avid lesion involving bilateral ribs. Index rib lesion on the left has an SUV max of 4.7. Multiple FDG avid lesions are seen within the pelvis. Index lesion within the left side of sacrum has an SUV max of 4.39. Diffuse heterogeneous uptake throughout the spine noted with multiple FDG avid lesions. Index lesion involving the L4 vertebra has an SUV max of 4.77. Associated pathologic fracture involving the L4 vertebra is again noted. Incidental CT findings: none IMPRESSION: 1. Exam positive for FDG avid tumor within the paraspinal region of the posterior mediastinum. There is also an FDG avid tumor within the left retroperitoneum. Poorly defined FDG avid soft tissue infiltration and nodularity within the right retroperitoneum and right upper quadrant of the abdomen is noted with suspected involvement of the right renal hilum. 2. 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 Many of these FDG avid lesions have corresponding lucent bone lesions on CT. Hypermetabolic tumor involving the L4 vertebra is noted with corresponding pathologic fracture. 3. Nonspecific masslike architectural distortion with mild increased uptake is noted within the lingula. Cannot exclude pulmonary the involvement by lymphoma. 4. Bilateral pleural effusions, left greater than right. Malignant pleural effusions not excluded. 5. Interval placement of left-sided nephroureteral stent with residual mild left hydronephrosis. 6.  Aortic Atherosclerosis (ICD10-I70.0). Electronically Signed   By: Kerby Moors M.D.   On: 12/07/2019 14:57   DG Chest Port 1 View  Result Date: 12/12/2019 CLINICAL DATA:  Pain status post fall EXAM: PORTABLE CHEST 1 VIEW COMPARISON:  08/01/2019 FINDINGS: There is a new moderate to large left-sided pleural effusion. There is a small right-sided pleural effusion. The heart size is relatively normal.  Aortic calcifications are noted. There is no pneumothorax. There is no definite acute displaced fracture however evaluation of the left-sided ribs is limited by the large pleural effusion and osteopenia. The patient's known posterior mediastinal mass is better appreciated on prior CT. IMPRESSION: Moderate to large left-sided pleural effusion, new since prior study. Electronically Signed   By: Constance Holster M.D.   On: 12/12/2019 01:52   DG Bone Survey Met  Result Date: 12/17/2019 CLINICAL DATA:  Kappa light chain myeloma EXAM: METASTATIC BONE SURVEY COMPARISON:  PET-CT from 12/07/2019 FINDINGS: Lateral view of the skull reveals no lytic or sclerotic lesions. Degenerative changes of the cervicothoracic and lumbar spine are noted without evidence of lytic or sclerotic lesions. No new compression deformities are seen. Chronic L3 compression deformity is noted. Upper extremities demonstrate degenerative changes of the shoulder joints bilaterally. No lytic or sclerotic lesions are seen. Pelvis demonstrates degenerative changes of the hip joints bilaterally. Known left sacral lesion is not well appreciated on this exam. Left nephrostomy and left ureteral stent are seen. Lower  extremities demonstrate degenerative changes of the hip and knee joints bilaterally. No lytic or sclerotic lesions are seen. The ribs demonstrate no definitive lesions although areas of increased activity were noted on prior PET-CT. IMPRESSION: No definitive lytic or sclerotic lesions. Electronically Signed   By: Inez Catalina M.D.   On: 12/17/2019 10:42   DG Foot 2 Views Left  Result Date: 12/12/2019 CLINICAL DATA:  Pain status post fall EXAM: LEFT FOOT - 2 VIEW COMPARISON:  None. FINDINGS: There is no evidence of fracture or dislocation. There is no evidence of arthropathy or other focal bone abnormality. There is mild soft tissue swelling about the dorsal aspect of the forefoot. Is a small plantar calcaneal spur. There are degenerative  changes of the midfoot. IMPRESSION: No acute displaced fracture. Electronically Signed   By: Constance Holster M.D.   On: 12/12/2019 01:46   CT BONE MARROW BIOPSY & ASPIRATION  Result Date: 12/15/2019 INDICATION: 84 year old with concern for myeloma.  Known retroperitoneal lesion. EXAM: CT GUIDED BONE MARROW ASPIRATES AND BIOPSY Physician: Stephan Minister. Anselm Pancoast, MD MEDICATIONS: None. ANESTHESIA/SEDATION: Fentanyl 25 mcg IV; Versed 0.5 mg IV Moderate Sedation Time:  12 minutes The patient was continuously monitored during the procedure by the interventional radiology nurse under my direct supervision. COMPLICATIONS: None immediate. PROCEDURE: The procedure was explained to the patient. The risks and benefits of the procedure were discussed and the patient's questions were addressed. Informed consent was obtained from the patient. The patient was placed prone on CT table. Images of the pelvis were obtained. The left side of back was prepped and draped in sterile fashion. The skin and left posterior ilium were anesthetized with 1% lidocaine. 11 gauge bone needle was directed into the left ilium with CT guidance. Two aspirates and one core biopsy were obtained. Bandage placed over the puncture site. FINDINGS: Again noted is soft tissue fullness along the left lower periaortic region. Partial visualization of the right kidney with renal cysts. Small amount of fluid or edema in the presacral region. Bones are heterogeneous with subtle areas of lucency. Bone needle was directed into the posterior left ilium. Adequate bone marrow samples were obtained. IMPRESSION: CT guided bone marrow aspiration and core biopsy. Electronically Signed   By: Markus Daft M.D.   On: 12/15/2019 10:46   DG C-Arm 1-60 Min-No Report  Result Date: 11/19/2019 Fluoroscopy was utilized by the requesting physician.  No radiographic interpretation.   CT Renal Stone Study  Result Date: 12/12/2019 CLINICAL DATA:  Lymphoma.  Hydronephrosis. EXAM: CT  ABDOMEN AND PELVIS WITHOUT CONTRAST TECHNIQUE: Multidetector CT imaging of the abdomen and pelvis was performed following the standard protocol without IV contrast. COMPARISON:  Ultrasound 12/11/2019. PET scan 12/07/2019. CT 11/17/2019. FINDINGS: Lower chest: Enlarging effusions left more than right with atelectasis in both lower lungs. Lymphoma mass in the retrocrural space, increased in volume compared to the study of January 12th. Thickness of tumor in this region up to 3 cm. Previously this measured about 2 cm. Majority encasement of the aorta. Hepatobiliary: No visible liver parenchymal lesion. Previous cholecystectomy. Pancreas: Negative Spleen: Negative Adrenals/Urinary Tract: Left adrenal gland appears normal. Right adrenal gland difficult to separate from retroperitoneal mass. Left kidney shows a double-J ureteral stent in place. Mild fullness of the left renal collecting system. Renal stent an ureter passes along side a left-sided retroperitoneal mass which has enlarged since the previous study, measuring approximately 4 x 7 cm compared with 2 x 4 cm previously. Hydronephrosis of the right kidney again seen,  with multiple cysts as well. Tumor infiltrating the region of the hilum has increased in volume. Bladder appears unremarkable, except for the left double-J ureteral stent entering the bladder. Stomach/Bowel: No evidence of bowel obstruction. Mild edema adjacent to the proximal sigmoid colon could possibly represent low level diverticulitis. This is more pronounced than was seen previously. Vascular/Lymphatic: See above regarding extensive retroperitoneal lymphoma tissue. Reproductive: Previous hysterectomy. Other: No free fluid or air. Musculoskeletal: Subacute fractures of the spine as seen previously, not grossly progressive. No lytic lesion or pathologic fracture of the pelvic bones. IMPRESSION: Pleural effusions larger on the left than the right with dependent atelectasis. Increase in tumor volume  in the retrocrural region and the left retroperitoneum at the abdominopelvic junction region. See above. Increase in amount of tumor in the right renal hilar region. Double-J ureteral stent in place on the left. Mild fullness of the left renal collecting system. Hydronephrosis of the right kidney, likely obstructed by the tumor in the renal hilar region. Increased edema in the fat in the proximal sigmoid colon region suggests mild diverticulitis. Multiple lumbar compression fractures at not visibly progressed. Electronically Signed   By: Nelson Chimes M.D.   On: 12/12/2019 05:24   IR NEPHROSTOMY PLACEMENT LEFT  Result Date: 12/13/2019 INDICATION: 84 year old female with a history of left-sided hydronephrosis, referred for nephrostomy placement EXAM: IR NEPHROSTOMY PLACEMENT LEFT COMPARISON:  None. MEDICATIONS: None ANESTHESIA/SEDATION: Fentanyl 100 mcg IV; Versed 2.0 mg IV Moderate Sedation Time:  15 minutes The patient was continuously monitored during the procedure by the interventional radiology nurse under my direct supervision. CONTRAST:  55m OMNIPAQUE IOHEXOL 300 MG/ML SOLN - administered into the collecting system(s) FLUOROSCOPY TIME:  Fluoroscopy Time: 3 minutes 12 seconds (22 mGy). COMPLICATIONS: None PROCEDURE: Informed written consent was obtained from the patient after a thorough discussion of the procedural risks, benefits and alternatives. All questions were addressed. Maximal Sterile Barrier Technique was utilized including caps, mask, sterile gowns, sterile gloves, sterile drape, hand hygiene and skin antiseptic. A timeout was performed prior to the initiation of the procedure. Patient positioned prone position on the fluoroscopy table. Ultrasound survey of the left flank was performed with images stored and sent to PACs. The patient was then prepped and draped in the usual sterile fashion. 1% lidocaine was used to anesthetize the skin and subcutaneous tissues for local anesthesia. A Chiba needle  was then used to access a posterior inferior calyx with ultrasound guidance. With spontaneous urine returned through the needle, passage of an 018 micro wire into the collecting system was performed under fluoroscopy. A small incision was made with an 11 blade scalpel, and the needle was removed from the wire. An Accustick system was then advanced over the wire into the collecting system under fluoroscopy. The metal stiffener and inner dilator were removed, and then a sample of fluid was aspirated through the 4 French outer sheath. Bentson wire was passed into the collecting system and the sheath removed. Ten French dilation of the soft tissues was performed. The dilator encountered a site of resistance and a kink in the wire developed. We then removed the dilator and pass the Accustick 4 French sheath over the wire into the collecting system. The wire was then removed entirely, and a new 035 Amplatz wire was placed. Using modified Seldinger technique, a 10 French pigtail catheter drain was placed over the Amplatz wire. Wire and inner stiffener removed, and the pigtail was formed in the collecting system. Small amount of contrast confirmed position of  the catheter. Patient tolerated the procedure well and remained hemodynamically stable throughout. No complications were encountered and no significant blood loss encountered IMPRESSION: Status post placement of left percutaneous nephrostomy. Signed, Dulcy Fanny. Dellia Nims, RPVI [facility] Radiology Electronically Signed   By: Corrie Mckusick D.O.   On: 12/13/2019 15:24   VAS [place] LOWER EXTREMITY VENOUS (DVT)  Result Date: 12/13/2019  Lower Venous DVT Study Indications: Edema.  Limitations: Body habitus and edema. Comparison Study: Prior Right Lower extremity venous duplex from 06/09/2018 is                   available for comparison Performing Technologist: Sharion Dove RVS  Examination Guidelines: A complete evaluation  includes B-mode imaging, spectral Doppler, color Doppler, and power Doppler as needed of all accessible portions of each vessel. Bilateral testing is considered an integral part of a complete examination. Limited examinations for reoccurring indications may be performed as noted. The reflux portion of the exam is performed with the patient in reverse Trendelenburg.  +---------+---------------+---------+-----------+----------+-------------------+ RIGHT    CompressibilityPhasicitySpontaneityPropertiesThrombus Aging      +---------+---------------+---------+-----------+----------+-------------------+ CFV      Full           Yes      Yes                                      +---------+---------------+---------+-----------+----------+-------------------+ SFJ      Full                                                             +---------+---------------+---------+-----------+----------+-------------------+ FV Prox  Full                                                             +---------+---------------+---------+-----------+----------+-------------------+ FV Mid   Full                                                             +---------+---------------+---------+-----------+----------+-------------------+ FV Distal               Yes      Yes                  patent by Color and                                                       Doppler             +---------+---------------+---------+-----------+----------+-------------------+ PFV      Full                                                             +---------+---------------+---------+-----------+----------+-------------------+  POP                     Yes      Yes                  patent by Color and                                                       Doppler             +---------+---------------+---------+-----------+----------+-------------------+ PTV      Full                                                              +---------+---------------+---------+-----------+----------+-------------------+ PERO     Full                                                             +---------+---------------+---------+-----------+----------+-------------------+   +---------+---------------+---------+-----------+----------+--------------+ LEFT     CompressibilityPhasicitySpontaneityPropertiesThrombus Aging +---------+---------------+---------+-----------+----------+--------------+ CFV      Full           Yes      Yes                                 +---------+---------------+---------+-----------+----------+--------------+ SFJ      Full                                                        +---------+---------------+---------+-----------+----------+--------------+ FV Prox  Full                                                        +---------+---------------+---------+-----------+----------+--------------+ FV Mid   Full                                                        +---------+---------------+---------+-----------+----------+--------------+ FV DistalFull                                                        +---------+---------------+---------+-----------+----------+--------------+ PFV      Full                                                        +---------+---------------+---------+-----------+----------+--------------+  POP      Full           Yes      Yes                                 +---------+---------------+---------+-----------+----------+--------------+ PTV      Full                                                        +---------+---------------+---------+-----------+----------+--------------+ PERO     Full                                                        +---------+---------------+---------+-----------+----------+--------------+     Summary: RIGHT: - Findings appear essentially unchanged compared  to previous examination. - There is no evidence of deep vein thrombosis in the lower extremity. However, portions of this examination were limited- see technologist comments above.  LEFT: - There is no evidence of deep vein thrombosis in the lower extremity.  *See table(s) above for measurements and observations. Electronically signed by Ruta Hinds MD on 12/13/2019 at 11:40:24 AM.    Final    US THORACENTESIS ASP PLEURAL SPACE W/IMG GUIDE  Result Date: 12/12/2019 INDICATION: Patient with history of retroperitoneal malignant neoplasm was bilateral pleural effusion presents for therapeutic and diagnostic thoracentesis. EXAM: ULTRASOUND GUIDED THERAPEUTIC AND DIAGNOSTIC THORACENTESIS MEDICATIONS: Lidocaine 1% 10 mL COMPLICATIONS: None immediate. PROCEDURE: An ultrasound guided thoracentesis was thoroughly discussed with the patient and questions answered. The benefits, risks, alternatives and complications were also discussed. The patient understands and wishes to proceed with the procedure. Written consent was obtained. Ultrasound was performed to localize and mark an adequate pocket of fluid in the left-sided chest. The area was then prepped and draped in the normal sterile fashion. 1% Lidocaine was used for local anesthesia. Under ultrasound guidance a catheter was introduced. Thoracentesis was performed. The catheter was removed and a dressing applied. FINDINGS: A total of approximately 650 mL of amber colored fluid was removed. Samples were sent to the laboratory as requested by the clinical team. IMPRESSION: Successful ultrasound guided therapeutic and diagnostic left-sided thoracentesis yielding 650 mL of pleural fluid. Read by Rushie Nyhan NP Electronically Signed   By: Corrie Mckusick D.O.   On: 12/12/2019 10:39    ASSESSMENT: Kappa light chain myeloma.  PLAN:    1. Kappa light chain myeloma: Malignant cells are present, but there is insufficient information for further classification.  Cells  do have a plasmacytoid appearance.  MRI of the brain did not reveal any significant pathology.  PET scan results from December 07, 2019 reviewed independently and report as above.  Case was discussed at length tumor board today and recommendation was to repeat biopsy of retroperitoneal mass and do a bone marrow biopsy simultaneously.  Return to clinic proxy 1 week after biopsy to discuss the results and treatment planning if desired. 2.  Renal insufficiency: Patient's creatinine is trending up and is now 2.0.  Possibly secondary to dehydration, but hesitant to give fluids given her increasing edema.  Patient has a ureteral stent and hydronephrosis, therefore will get  ultrasound of her kidneys to assess if the stent is still functioning.  Can refer back to urology if necessary. 3.  Pain: Patient was given a prescription for tramadol today. 4.  Peripheral edema/shortness of breath: Hold increasing Lasix given her renal insufficiency.   5.  Poor appetite/weight loss: Patient was previously given a prescription for Megace.  She was also previously given a referral to dietary. 6.  Declining performance status: Outpatient palliative care consult has been placed.    Patient expressed understanding and was in agreement with this plan. She also understands that She can call clinic at any time with any questions, concerns, or complaints.   Cancer Staging No matching staging information was found for the patient.  Lloyd Huger, MD   12/19/2019 10:32 AM

## 2019-12-21 ENCOUNTER — Other Ambulatory Visit: Payer: Self-pay | Admitting: *Deleted

## 2019-12-21 ENCOUNTER — Telehealth: Payer: Self-pay | Admitting: Radiology

## 2019-12-21 ENCOUNTER — Telehealth: Payer: Self-pay

## 2019-12-21 NOTE — Telephone Encounter (Signed)
Granddaughter, [name], states patient is beginning hospice care and she will be in touch regarding stent removal scheduled 01/06/2020 after speaking with hospice nurse. Also notified her of nephrostomy tube exchange due early April. Again, she will be in touch before making appointment.

## 2019-12-21 NOTE — Patient Outreach (Signed)
[place] [facility]) Care Management  12/21/2019  Regina Harris November 05, 1936 [id number]   Telephone Assessment Primary provider to complete Transition of care (d/c 12/18/2019)  Rn spoke with aide in the home who indicated pt was sleeping. Informed to follow up with the grand-daughter. RN will follow up with the grand-daughter [name] tomorrow for an update on pt's recent discharge from the hospital. RN verified the contact for the primary caregiver and will follow up accordingly.  Raina Mina, RN Care Management Coordinator [place] Office [phone]

## 2019-12-21 NOTE — Telephone Encounter (Signed)
Transition Care Management Follow-up Telephone Call  Date of discharge and from where: 12/18/2019, [facility]   How have you been since you were released from the hospital? Spoke with patient's granddaughter, [name], and she states that patient is not doing well. Dr. Grayland Ormond is in the process of ordering hospice due to her cancer progressing. [name] was very emotional and was crying on the phone call. [name] also cancelled her hospital follow up visit due to patient not being mobile on her own.   Any questions or concerns? No   Items Reviewed:  Did the pt receive and understand the discharge instructions provided? Yes   Medications obtained and verified? Yes   Any new allergies since your discharge? No   Dietary orders reviewed? Yes  Do you have support at home? Yes   Functional Questionnaire: (I = Independent and D = Dependent) ADLs: D  Bathing/Dressing- D  Meal Prep- D  Eating- D  Maintaining continence- D  Transferring/Ambulation- D  Managing Meds- D  Follow up appointments reviewed:   PCP Hospital f/u appt confirmed? [name] cancelled hospital follow up because patient is no longer mobile. Hospice is currently being ordered.    [facility] f/u appt confirmed? Yes  Dr. Grayland Ormond  Are transportation arrangements needed? No   If their condition worsens, is the pt aware to call PCP or go to the Emergency Dept.? Yes  Was the patient provided with contact information for the PCP's office or ED? Yes  Was to pt encouraged to call back with questions or concerns? Yes

## 2019-12-22 ENCOUNTER — Other Ambulatory Visit: Payer: Self-pay | Admitting: *Deleted

## 2019-12-22 ENCOUNTER — Other Ambulatory Visit: Payer: Self-pay | Admitting: Oncology

## 2019-12-22 DIAGNOSIS — E785 Hyperlipidemia, unspecified: Secondary | ICD-10-CM | POA: Diagnosis not present

## 2019-12-22 DIAGNOSIS — C9000 Multiple myeloma not having achieved remission: Secondary | ICD-10-CM | POA: Diagnosis not present

## 2019-12-22 DIAGNOSIS — Z436 Encounter for attention to other artificial openings of urinary tract: Secondary | ICD-10-CM | POA: Diagnosis not present

## 2019-12-22 DIAGNOSIS — E1122 Type 2 diabetes mellitus with diabetic chronic kidney disease: Secondary | ICD-10-CM | POA: Diagnosis not present

## 2019-12-22 DIAGNOSIS — Z8616 Personal history of COVID-19: Secondary | ICD-10-CM | POA: Diagnosis not present

## 2019-12-22 DIAGNOSIS — E46 Unspecified protein-calorie malnutrition: Secondary | ICD-10-CM | POA: Diagnosis not present

## 2019-12-22 DIAGNOSIS — K219 Gastro-esophageal reflux disease without esophagitis: Secondary | ICD-10-CM | POA: Diagnosis not present

## 2019-12-22 DIAGNOSIS — N184 Chronic kidney disease, stage 4 (severe): Secondary | ICD-10-CM | POA: Diagnosis not present

## 2019-12-22 DIAGNOSIS — I129 Hypertensive chronic kidney disease with stage 1 through stage 4 chronic kidney disease, or unspecified chronic kidney disease: Secondary | ICD-10-CM | POA: Diagnosis not present

## 2019-12-22 DIAGNOSIS — L89102 Pressure ulcer of unspecified part of back, stage 2: Secondary | ICD-10-CM | POA: Diagnosis not present

## 2019-12-22 DIAGNOSIS — F0150 Vascular dementia without behavioral disturbance: Secondary | ICD-10-CM | POA: Diagnosis not present

## 2019-12-22 DIAGNOSIS — M1990 Unspecified osteoarthritis, unspecified site: Secondary | ICD-10-CM | POA: Diagnosis not present

## 2019-12-22 DIAGNOSIS — K2270 Barrett's esophagus without dysplasia: Secondary | ICD-10-CM | POA: Diagnosis not present

## 2019-12-22 DIAGNOSIS — E039 Hypothyroidism, unspecified: Secondary | ICD-10-CM | POA: Diagnosis not present

## 2019-12-22 LAB — CULTURE, BLOOD (ROUTINE X 2)
Culture: NO GROWTH
Culture: NO GROWTH
Special Requests: ADEQUATE

## 2019-12-22 MED ORDER — LORAZEPAM 0.5 MG PO TABS: 0.5000 mg | ORAL_TABLET | Freq: Two times a day (BID) | ORAL | 0 refills | Status: AC | PRN

## 2019-12-22 MED ORDER — OXYCODONE HCL 5 MG PO TABS: 5.0000 mg | ORAL_TABLET | Freq: Four times a day (QID) | ORAL | 0 refills | Status: AC | PRN

## 2019-12-22 NOTE — Patient Outreach (Signed)
[facility] [facility]) Care Management  12/22/2019  Regina Harris 1936/05/15 [id number]    Telephone Assessment Recent discharged from [facility]-provider to complete [facility]  RN attempted outreach call today to the pt's grand-daughter ([name]) however unsuccessful. RN able to leave a HIPAA approved voice message requesting a call back.  Plan: Will send outreach letter and attempt another outreach call over the next week.  Raina Mina, RN Care Management Coordinator [place] Office [phone]

## 2019-12-23 ENCOUNTER — Inpatient Hospital Stay: Payer: Medicare Other | Admitting: Oncology

## 2019-12-23 ENCOUNTER — Inpatient Hospital Stay: Payer: Medicare Other

## 2019-12-23 ENCOUNTER — Ambulatory Visit: Payer: Medicare Other

## 2019-12-23 DIAGNOSIS — I129 Hypertensive chronic kidney disease with stage 1 through stage 4 chronic kidney disease, or unspecified chronic kidney disease: Secondary | ICD-10-CM | POA: Diagnosis not present

## 2019-12-23 DIAGNOSIS — E1122 Type 2 diabetes mellitus with diabetic chronic kidney disease: Secondary | ICD-10-CM | POA: Diagnosis not present

## 2019-12-23 DIAGNOSIS — N184 Chronic kidney disease, stage 4 (severe): Secondary | ICD-10-CM | POA: Diagnosis not present

## 2019-12-23 DIAGNOSIS — C9000 Multiple myeloma not having achieved remission: Secondary | ICD-10-CM | POA: Diagnosis not present

## 2019-12-23 DIAGNOSIS — F0150 Vascular dementia without behavioral disturbance: Secondary | ICD-10-CM | POA: Diagnosis not present

## 2019-12-23 DIAGNOSIS — E785 Hyperlipidemia, unspecified: Secondary | ICD-10-CM | POA: Diagnosis not present

## 2019-12-23 LAB — COMP PANEL: LEUKEMIA/LYMPHOMA: Immunophenotypic Profile: 0

## 2019-12-24 ENCOUNTER — Encounter (HOSPITAL_COMMUNITY): Payer: Self-pay | Admitting: Oncology

## 2019-12-24 ENCOUNTER — Other Ambulatory Visit: Payer: Self-pay | Admitting: *Deleted

## 2019-12-24 ENCOUNTER — Inpatient Hospital Stay: Payer: Medicare Other | Admitting: Family Medicine

## 2019-12-24 DIAGNOSIS — E785 Hyperlipidemia, unspecified: Secondary | ICD-10-CM | POA: Diagnosis not present

## 2019-12-24 DIAGNOSIS — F0150 Vascular dementia without behavioral disturbance: Secondary | ICD-10-CM | POA: Diagnosis not present

## 2019-12-24 DIAGNOSIS — E1122 Type 2 diabetes mellitus with diabetic chronic kidney disease: Secondary | ICD-10-CM | POA: Diagnosis not present

## 2019-12-24 DIAGNOSIS — C9000 Multiple myeloma not having achieved remission: Secondary | ICD-10-CM | POA: Diagnosis not present

## 2019-12-24 DIAGNOSIS — N184 Chronic kidney disease, stage 4 (severe): Secondary | ICD-10-CM | POA: Diagnosis not present

## 2019-12-24 DIAGNOSIS — I129 Hypertensive chronic kidney disease with stage 1 through stage 4 chronic kidney disease, or unspecified chronic kidney disease: Secondary | ICD-10-CM | POA: Diagnosis not present

## 2019-12-24 NOTE — Patient Outreach (Signed)
[place] [facility]) Care Management  12/24/2019  KATIYA FIKE 1936-02-02 [id number]    Case Closure-Hospice   Pt discussed in multidisciplinary case discussion.  Update: RN able to verify with Hospice services that pt has enrolled into the Hospice services due to her prognosis on 12/22/2019. Pt no longer eligible for [facility] services and will be monitored under an external case management services.   Plan: Will notify provider of pt's disposition [facility] services.  Raina Mina, RN Care Management Coordinator [place] Office [phone]

## 2019-12-26 DIAGNOSIS — E785 Hyperlipidemia, unspecified: Secondary | ICD-10-CM | POA: Diagnosis not present

## 2019-12-26 DIAGNOSIS — N184 Chronic kidney disease, stage 4 (severe): Secondary | ICD-10-CM | POA: Diagnosis not present

## 2019-12-26 DIAGNOSIS — E1122 Type 2 diabetes mellitus with diabetic chronic kidney disease: Secondary | ICD-10-CM | POA: Diagnosis not present

## 2019-12-26 DIAGNOSIS — I129 Hypertensive chronic kidney disease with stage 1 through stage 4 chronic kidney disease, or unspecified chronic kidney disease: Secondary | ICD-10-CM | POA: Diagnosis not present

## 2019-12-26 DIAGNOSIS — C9000 Multiple myeloma not having achieved remission: Secondary | ICD-10-CM | POA: Diagnosis not present

## 2019-12-26 DIAGNOSIS — F0150 Vascular dementia without behavioral disturbance: Secondary | ICD-10-CM | POA: Diagnosis not present

## 2019-12-28 DIAGNOSIS — I129 Hypertensive chronic kidney disease with stage 1 through stage 4 chronic kidney disease, or unspecified chronic kidney disease: Secondary | ICD-10-CM | POA: Diagnosis not present

## 2019-12-28 DIAGNOSIS — E785 Hyperlipidemia, unspecified: Secondary | ICD-10-CM | POA: Diagnosis not present

## 2019-12-28 DIAGNOSIS — F0150 Vascular dementia without behavioral disturbance: Secondary | ICD-10-CM | POA: Diagnosis not present

## 2019-12-28 DIAGNOSIS — N184 Chronic kidney disease, stage 4 (severe): Secondary | ICD-10-CM | POA: Diagnosis not present

## 2019-12-28 DIAGNOSIS — E1122 Type 2 diabetes mellitus with diabetic chronic kidney disease: Secondary | ICD-10-CM | POA: Diagnosis not present

## 2019-12-28 DIAGNOSIS — C9000 Multiple myeloma not having achieved remission: Secondary | ICD-10-CM | POA: Diagnosis not present

## 2019-12-29 ENCOUNTER — Ambulatory Visit: Payer: Self-pay | Admitting: *Deleted

## 2019-12-30 ENCOUNTER — Ambulatory Visit: Payer: Medicare Other | Admitting: Oncology

## 2019-12-30 DIAGNOSIS — I129 Hypertensive chronic kidney disease with stage 1 through stage 4 chronic kidney disease, or unspecified chronic kidney disease: Secondary | ICD-10-CM | POA: Diagnosis not present

## 2019-12-30 DIAGNOSIS — E785 Hyperlipidemia, unspecified: Secondary | ICD-10-CM | POA: Diagnosis not present

## 2019-12-30 DIAGNOSIS — N184 Chronic kidney disease, stage 4 (severe): Secondary | ICD-10-CM | POA: Diagnosis not present

## 2019-12-30 DIAGNOSIS — C9000 Multiple myeloma not having achieved remission: Secondary | ICD-10-CM | POA: Diagnosis not present

## 2019-12-30 DIAGNOSIS — F0150 Vascular dementia without behavioral disturbance: Secondary | ICD-10-CM | POA: Diagnosis not present

## 2019-12-30 DIAGNOSIS — E1122 Type 2 diabetes mellitus with diabetic chronic kidney disease: Secondary | ICD-10-CM | POA: Diagnosis not present

## 2020-01-01 ENCOUNTER — Other Ambulatory Visit: Payer: Self-pay | Admitting: Hospice and Palliative Medicine

## 2020-01-01 DIAGNOSIS — I129 Hypertensive chronic kidney disease with stage 1 through stage 4 chronic kidney disease, or unspecified chronic kidney disease: Secondary | ICD-10-CM | POA: Diagnosis not present

## 2020-01-01 DIAGNOSIS — C9000 Multiple myeloma not having achieved remission: Secondary | ICD-10-CM | POA: Diagnosis not present

## 2020-01-01 DIAGNOSIS — E785 Hyperlipidemia, unspecified: Secondary | ICD-10-CM | POA: Diagnosis not present

## 2020-01-01 DIAGNOSIS — F0150 Vascular dementia without behavioral disturbance: Secondary | ICD-10-CM | POA: Diagnosis not present

## 2020-01-01 DIAGNOSIS — N184 Chronic kidney disease, stage 4 (severe): Secondary | ICD-10-CM | POA: Diagnosis not present

## 2020-01-01 DIAGNOSIS — E1122 Type 2 diabetes mellitus with diabetic chronic kidney disease: Secondary | ICD-10-CM | POA: Diagnosis not present

## 2020-01-01 MED ORDER — MORPHINE SULFATE (CONCENTRATE) 10 MG /0.5 ML PO SOLN: 5.0000 mg | ORAL | 0 refills | Status: DC | PRN

## 2020-01-01 MED ORDER — GUAIFENESIN 100 MG/5ML PO SOLN: 5.0000 mL | ORAL | 0 refills | Status: AC | PRN

## 2020-01-01 NOTE — Progress Notes (Signed)
I spoke with patient's hospice nurse. Patient has been using morphine from the comfort kit. Refill sent.

## 2020-01-04 DIAGNOSIS — Z8616 Personal history of COVID-19: Secondary | ICD-10-CM | POA: Diagnosis not present

## 2020-01-04 DIAGNOSIS — E785 Hyperlipidemia, unspecified: Secondary | ICD-10-CM | POA: Diagnosis not present

## 2020-01-04 DIAGNOSIS — E039 Hypothyroidism, unspecified: Secondary | ICD-10-CM | POA: Diagnosis not present

## 2020-01-04 DIAGNOSIS — L89102 Pressure ulcer of unspecified part of back, stage 2: Secondary | ICD-10-CM | POA: Diagnosis not present

## 2020-01-04 DIAGNOSIS — E46 Unspecified protein-calorie malnutrition: Secondary | ICD-10-CM | POA: Diagnosis not present

## 2020-01-04 DIAGNOSIS — K219 Gastro-esophageal reflux disease without esophagitis: Secondary | ICD-10-CM | POA: Diagnosis not present

## 2020-01-04 DIAGNOSIS — K2270 Barrett's esophagus without dysplasia: Secondary | ICD-10-CM | POA: Diagnosis not present

## 2020-01-04 DIAGNOSIS — N184 Chronic kidney disease, stage 4 (severe): Secondary | ICD-10-CM | POA: Diagnosis not present

## 2020-01-04 DIAGNOSIS — F0150 Vascular dementia without behavioral disturbance: Secondary | ICD-10-CM | POA: Diagnosis not present

## 2020-01-04 DIAGNOSIS — I129 Hypertensive chronic kidney disease with stage 1 through stage 4 chronic kidney disease, or unspecified chronic kidney disease: Secondary | ICD-10-CM | POA: Diagnosis not present

## 2020-01-04 DIAGNOSIS — C9000 Multiple myeloma not having achieved remission: Secondary | ICD-10-CM | POA: Diagnosis not present

## 2020-01-04 DIAGNOSIS — E1122 Type 2 diabetes mellitus with diabetic chronic kidney disease: Secondary | ICD-10-CM | POA: Diagnosis not present

## 2020-01-04 DIAGNOSIS — M1990 Unspecified osteoarthritis, unspecified site: Secondary | ICD-10-CM | POA: Diagnosis not present

## 2020-01-04 DIAGNOSIS — Z436 Encounter for attention to other artificial openings of urinary tract: Secondary | ICD-10-CM | POA: Diagnosis not present

## 2020-01-06 ENCOUNTER — Encounter: Payer: Medicare Other | Admitting: Urology

## 2020-01-06 DIAGNOSIS — N184 Chronic kidney disease, stage 4 (severe): Secondary | ICD-10-CM | POA: Diagnosis not present

## 2020-01-06 DIAGNOSIS — I129 Hypertensive chronic kidney disease with stage 1 through stage 4 chronic kidney disease, or unspecified chronic kidney disease: Secondary | ICD-10-CM | POA: Diagnosis not present

## 2020-01-06 DIAGNOSIS — C9000 Multiple myeloma not having achieved remission: Secondary | ICD-10-CM | POA: Diagnosis not present

## 2020-01-06 DIAGNOSIS — F0150 Vascular dementia without behavioral disturbance: Secondary | ICD-10-CM | POA: Diagnosis not present

## 2020-01-06 DIAGNOSIS — E785 Hyperlipidemia, unspecified: Secondary | ICD-10-CM | POA: Diagnosis not present

## 2020-01-06 DIAGNOSIS — E1122 Type 2 diabetes mellitus with diabetic chronic kidney disease: Secondary | ICD-10-CM | POA: Diagnosis not present

## 2020-01-07 ENCOUNTER — Other Ambulatory Visit: Payer: Self-pay | Admitting: Hospice and Palliative Medicine

## 2020-01-07 DIAGNOSIS — I129 Hypertensive chronic kidney disease with stage 1 through stage 4 chronic kidney disease, or unspecified chronic kidney disease: Secondary | ICD-10-CM | POA: Diagnosis not present

## 2020-01-07 DIAGNOSIS — N184 Chronic kidney disease, stage 4 (severe): Secondary | ICD-10-CM | POA: Diagnosis not present

## 2020-01-07 DIAGNOSIS — F0150 Vascular dementia without behavioral disturbance: Secondary | ICD-10-CM | POA: Diagnosis not present

## 2020-01-07 DIAGNOSIS — E785 Hyperlipidemia, unspecified: Secondary | ICD-10-CM | POA: Diagnosis not present

## 2020-01-07 DIAGNOSIS — E1122 Type 2 diabetes mellitus with diabetic chronic kidney disease: Secondary | ICD-10-CM | POA: Diagnosis not present

## 2020-01-07 DIAGNOSIS — C9000 Multiple myeloma not having achieved remission: Secondary | ICD-10-CM | POA: Diagnosis not present

## 2020-01-07 MED ORDER — MORPHINE SULFATE (CONCENTRATE) 10 MG /0.5 ML PO SOLN: 5.0000 mg | ORAL | 0 refills | Status: AC | PRN

## 2020-01-07 NOTE — Progress Notes (Signed)
[place]

## 2020-01-09 DIAGNOSIS — E1122 Type 2 diabetes mellitus with diabetic chronic kidney disease: Secondary | ICD-10-CM | POA: Diagnosis not present

## 2020-01-09 DIAGNOSIS — E785 Hyperlipidemia, unspecified: Secondary | ICD-10-CM | POA: Diagnosis not present

## 2020-01-09 DIAGNOSIS — I129 Hypertensive chronic kidney disease with stage 1 through stage 4 chronic kidney disease, or unspecified chronic kidney disease: Secondary | ICD-10-CM | POA: Diagnosis not present

## 2020-01-09 DIAGNOSIS — F0150 Vascular dementia without behavioral disturbance: Secondary | ICD-10-CM | POA: Diagnosis not present

## 2020-01-09 DIAGNOSIS — C9000 Multiple myeloma not having achieved remission: Secondary | ICD-10-CM | POA: Diagnosis not present

## 2020-01-09 DIAGNOSIS — N184 Chronic kidney disease, stage 4 (severe): Secondary | ICD-10-CM | POA: Diagnosis not present

## 2020-01-10 DIAGNOSIS — I129 Hypertensive chronic kidney disease with stage 1 through stage 4 chronic kidney disease, or unspecified chronic kidney disease: Secondary | ICD-10-CM | POA: Diagnosis not present

## 2020-01-10 DIAGNOSIS — F0150 Vascular dementia without behavioral disturbance: Secondary | ICD-10-CM | POA: Diagnosis not present

## 2020-01-10 DIAGNOSIS — E785 Hyperlipidemia, unspecified: Secondary | ICD-10-CM | POA: Diagnosis not present

## 2020-01-10 DIAGNOSIS — C9000 Multiple myeloma not having achieved remission: Secondary | ICD-10-CM | POA: Diagnosis not present

## 2020-01-10 DIAGNOSIS — N184 Chronic kidney disease, stage 4 (severe): Secondary | ICD-10-CM | POA: Diagnosis not present

## 2020-01-10 DIAGNOSIS — E1122 Type 2 diabetes mellitus with diabetic chronic kidney disease: Secondary | ICD-10-CM | POA: Diagnosis not present

## 2020-01-11 ENCOUNTER — Telehealth: Payer: Self-pay | Admitting: *Deleted

## 2020-01-11 DIAGNOSIS — I129 Hypertensive chronic kidney disease with stage 1 through stage 4 chronic kidney disease, or unspecified chronic kidney disease: Secondary | ICD-10-CM | POA: Diagnosis not present

## 2020-01-11 DIAGNOSIS — F0150 Vascular dementia without behavioral disturbance: Secondary | ICD-10-CM | POA: Diagnosis not present

## 2020-01-11 DIAGNOSIS — E1122 Type 2 diabetes mellitus with diabetic chronic kidney disease: Secondary | ICD-10-CM | POA: Diagnosis not present

## 2020-01-11 DIAGNOSIS — N184 Chronic kidney disease, stage 4 (severe): Secondary | ICD-10-CM | POA: Diagnosis not present

## 2020-01-11 DIAGNOSIS — C9000 Multiple myeloma not having achieved remission: Secondary | ICD-10-CM | POA: Diagnosis not present

## 2020-01-11 DIAGNOSIS — E785 Hyperlipidemia, unspecified: Secondary | ICD-10-CM | POA: Diagnosis not present

## 2020-01-29 ENCOUNTER — Ambulatory Visit: Payer: Medicare Other | Admitting: *Deleted

## 2020-02-04 NOTE — Telephone Encounter (Signed)
Regina Harris with Hospice called to report that patient passed at 11:39 this morning with the family at her side. And to thank [place] for all the care and orders to keep patient comfortable

## 2020-02-04 DEATH — deceased

## 2020-02-17 ENCOUNTER — Ambulatory Visit: Payer: Self-pay | Admitting: Urology

## 2021-06-13 IMAGING — CT CT CHEST W/O CM
2 of 3 series · 15 of 36 positions shown, 18 images · non-contrast
Comparison: Chest CT 07/17/2010

CLINICAL DATA: Adenopathy

EXAM:
CT CHEST WITHOUT CONTRAST
TECHNIQUE: Multidetector CT imaging of the chest was performed following the
standard protocol without IV contrast.

[Series 2: thorax · axial · 0.68mm/px · z∈[-934,-642]mm · 12 of 172 slices shown, 15 images]
[im 13/172  mediastinal]
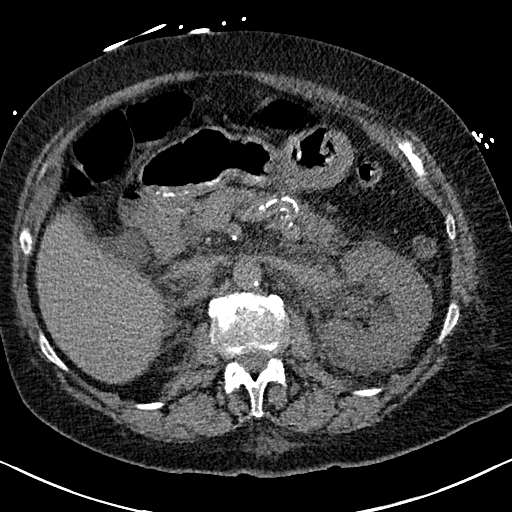
[im 13/172  lung]
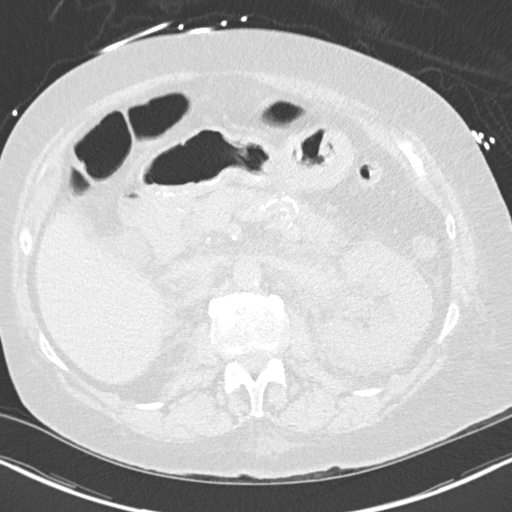
[im 26/172  lung]
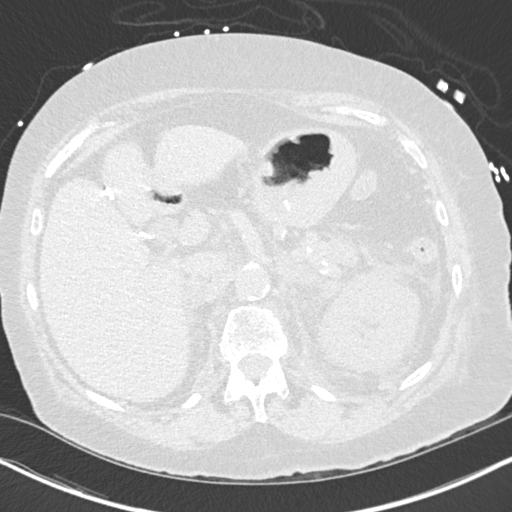
[im 39/172  lung]
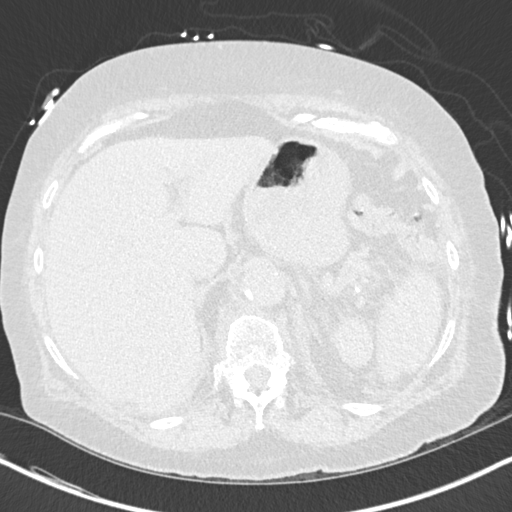
[im 51/172  lung]
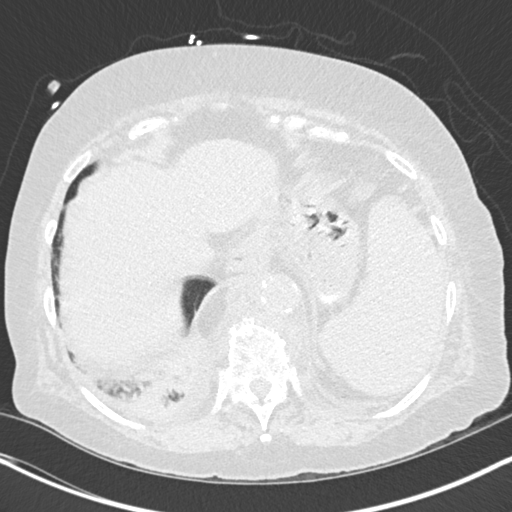
[im 64/172  mediastinal]
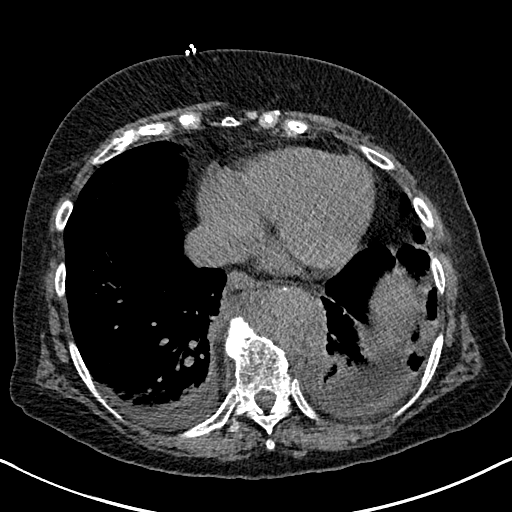
[im 64/172  lung]
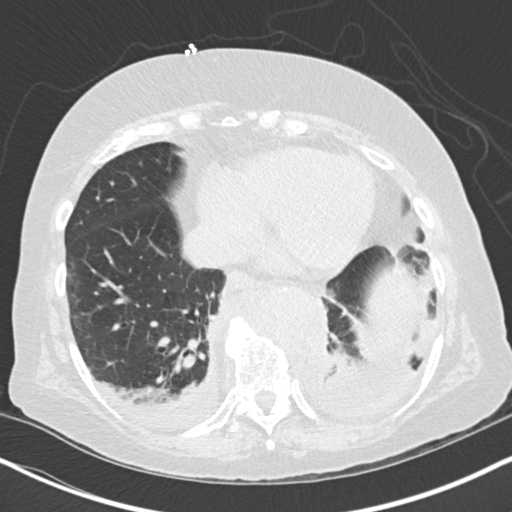
[im 77/172  lung]
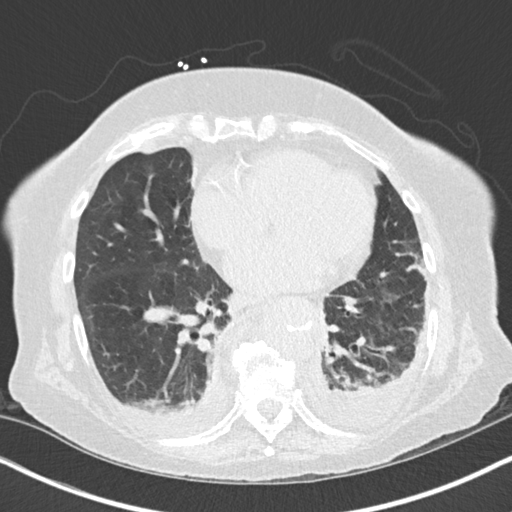
[im 96/172  lung]
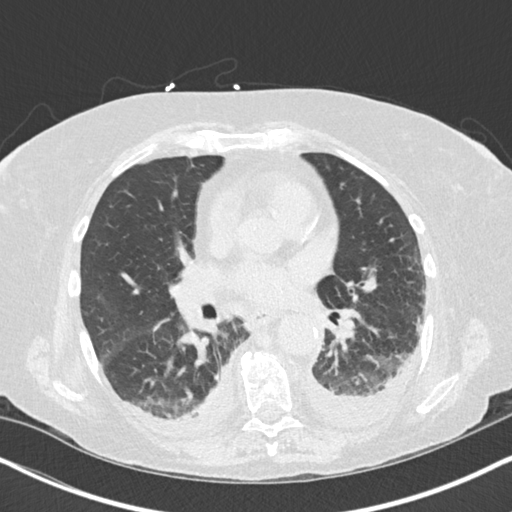
[im 108/172  lung]
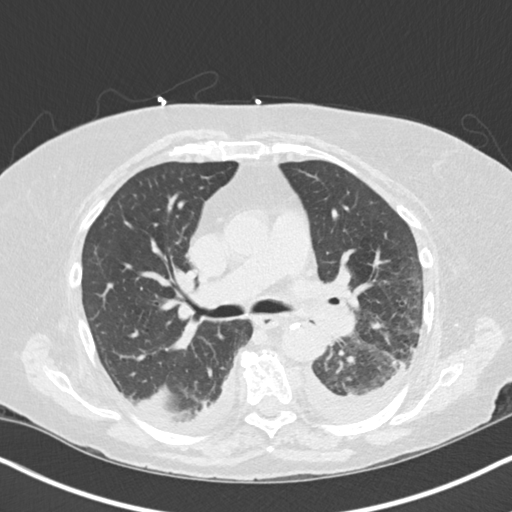
[im 121/172  mediastinal]
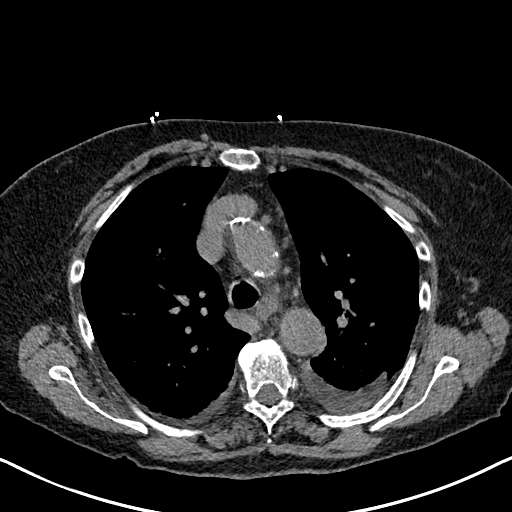
[im 121/172  lung]
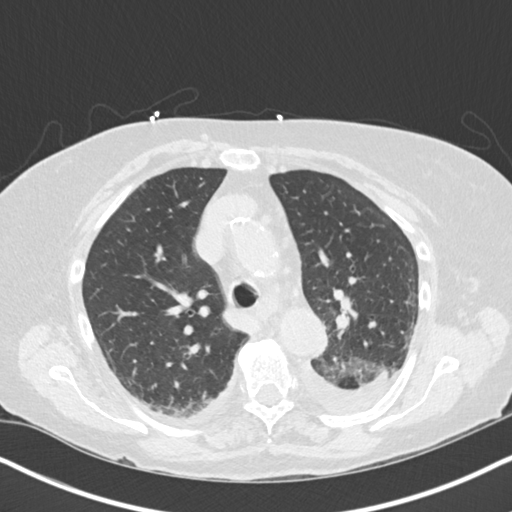
[im 134/172  lung]
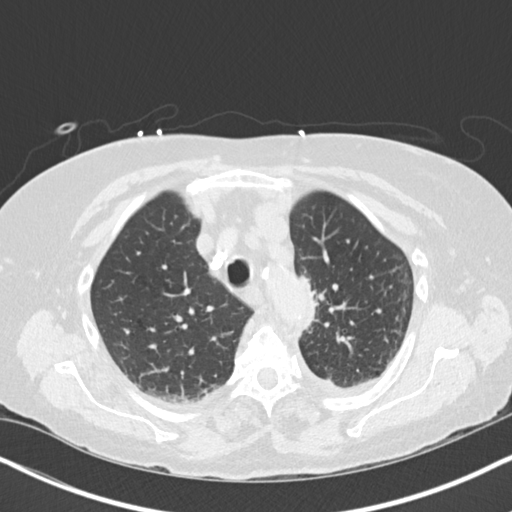
[im 146/172  lung]
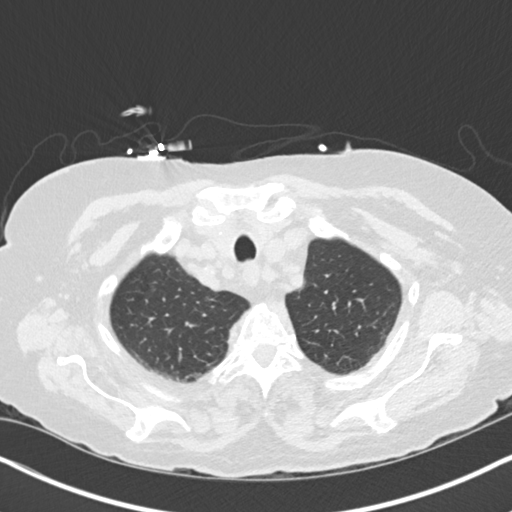
[im 159/172  lung]
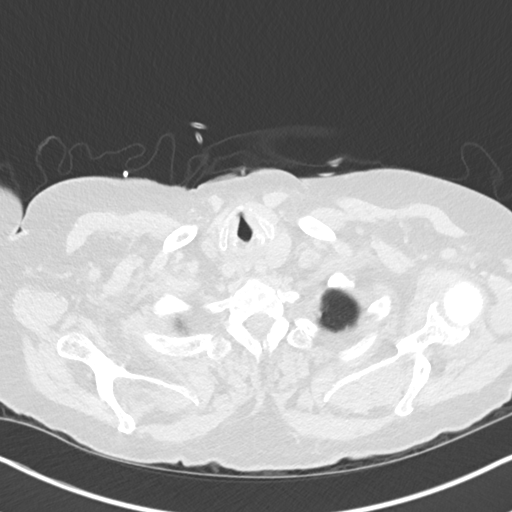

[Series 5: coronal · coronal · 0.68mm/px · 3 of 142 slices shown]
[im 29/142  lung]
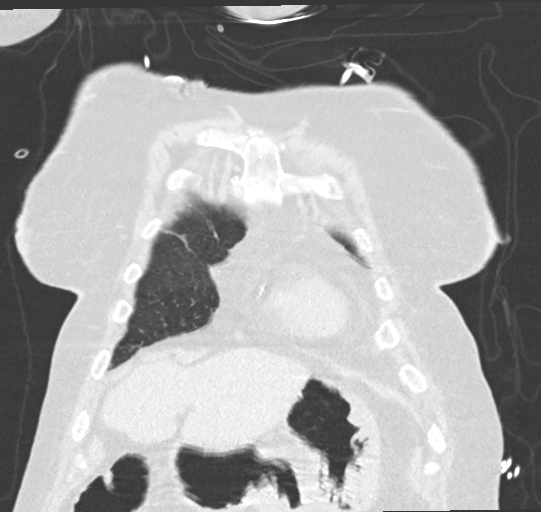
[im 57/142  lung]
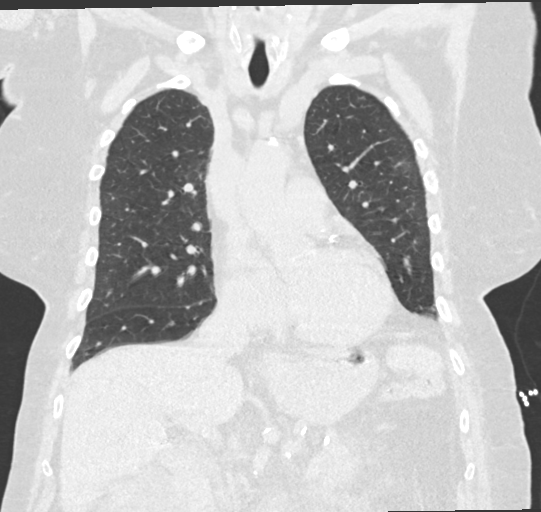
[im 85/142  lung]
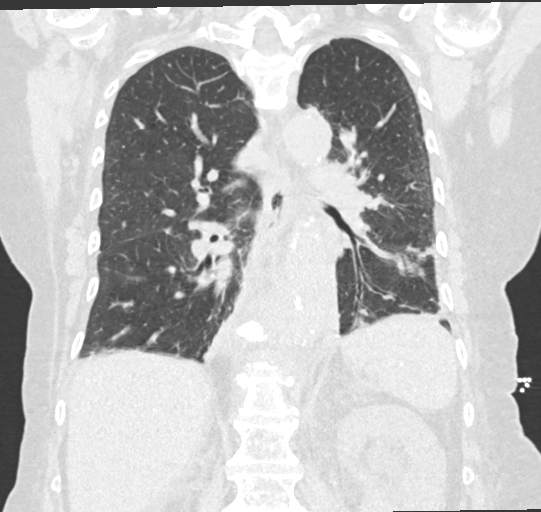

[15 of 36 positions shown; findings below may reference images not displayed]

FINDINGS: Cardiovascular: Normal heart size. No pericardial effusion. Aortic
and coronary atherosclerosis.

Mediastinum/Nodes: Infiltrative posterior mediastinal mass partially
encasing the descending aorta with indistinguishable plane between
the mass and the posterior wall of the aorta separate from areas of
intimal calcification. The infiltrative abnormality extends
approximately 9 cm craniocaudal and 5.6 cm in diameter. The hila and
other mediastinal spaces are negative for adenopathy.

Lungs/Pleura: Small pleural effusions on both sides. There is
subpleural extension of the mass with no visible nodularity along
the pleural surface, although limited without contrast. Dependent
atelectasis. No worrisome pulmonary nodules.

Upper Abdomen: Partial coverage shows left hydronephrosis likely due
to the retroperitoneal mass medial to the left psoas on prior CT.
Right renal cystic change and atherosclerotic calcification.
Cholecystectomy.

Musculoskeletal: The posterior mediastinal mass is in close
proximity to the spine but does not cause visible erosion or
infiltrative lucency. No gross spinal canal mass. No evidence of
hematogenous osseous metastases.
IMPRESSION: 1. Approximately 9 x 6 cm infiltrative posterior mediastinal mass
intimately associated with the descending aorta and thoracic spine.
Lymphoma is favored.
2. Known left hydronephrosis related to infiltrative mass in the
retroperitoneum.
3. Small pleural effusions with lower lobe atelectasis.

## 2021-06-15 IMAGING — CT CT GUIDANCE NEEDLE PLACEMENT
2 of 3 series · 13 of 32 positions shown, 19 images · non-contrast
Comparison: CT abdomen and pelvis-09/16/2020

INDICATION: No known primary, now with retroperitoneal mass worrisome for
lymphoma. Please perform CT-guided biopsy for tissue diagnostic
purposes.

EXAM:
CT GUIDANCE NEEDLE PLACEMENT

[Series 3: i-spiral 5.0 b30f · axial · 0.94mm/px · z∈[-338,-244]mm · 11 of 33 slices shown, 17 images (1 of 2)]
[im 3/33  soft-tissue]
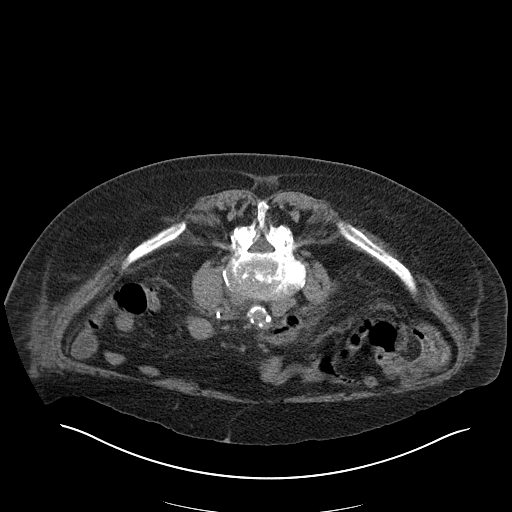
[im 3/33  bone]
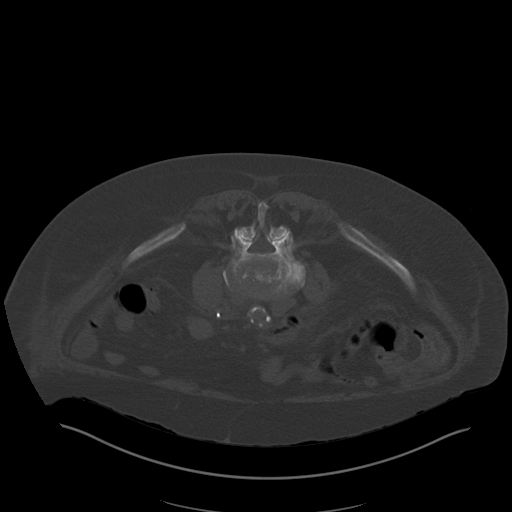
[im 6/33  soft-tissue]
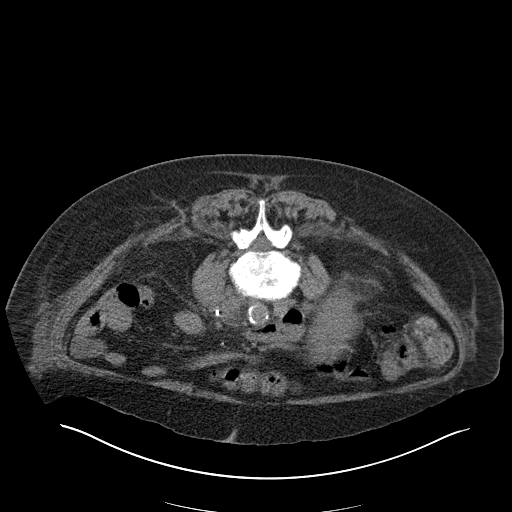
[im 9/33  soft-tissue]
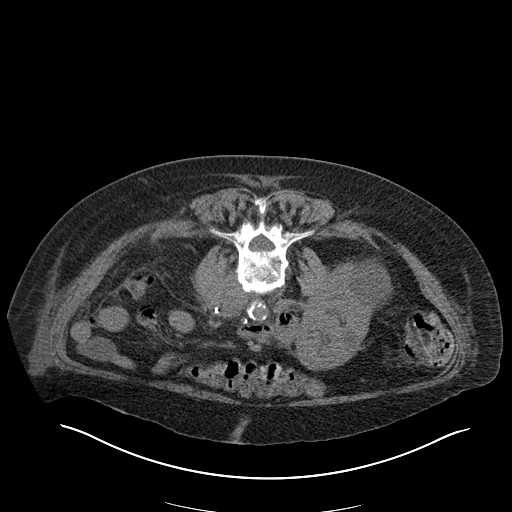
[im 11/33  soft-tissue]
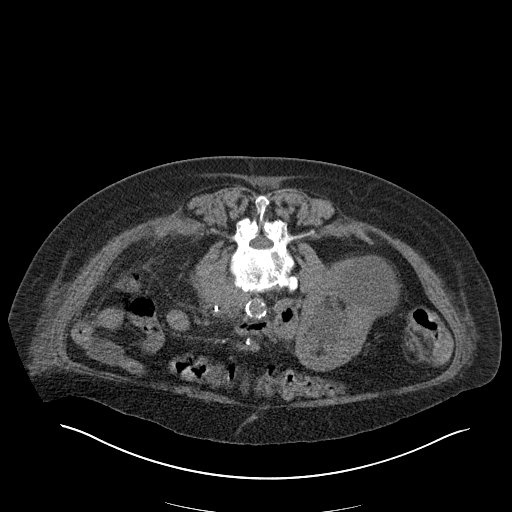
[im 14/33  soft-tissue]
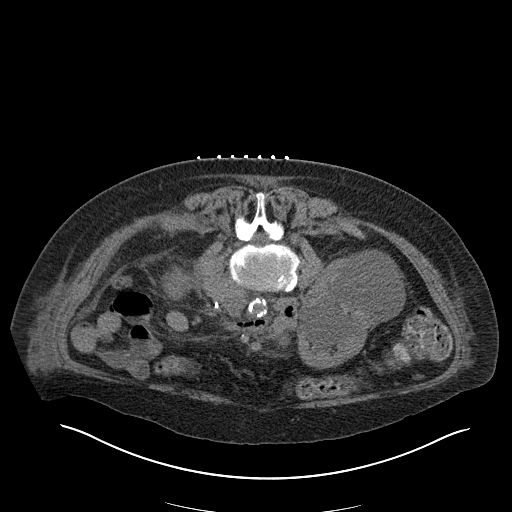
[im 17/33  soft-tissue]
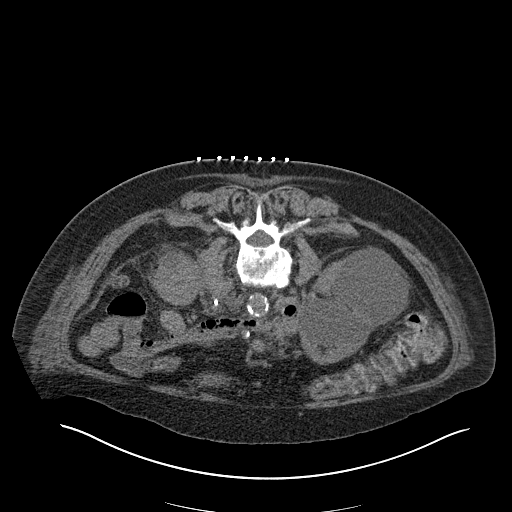
[im 19/33  soft-tissue]
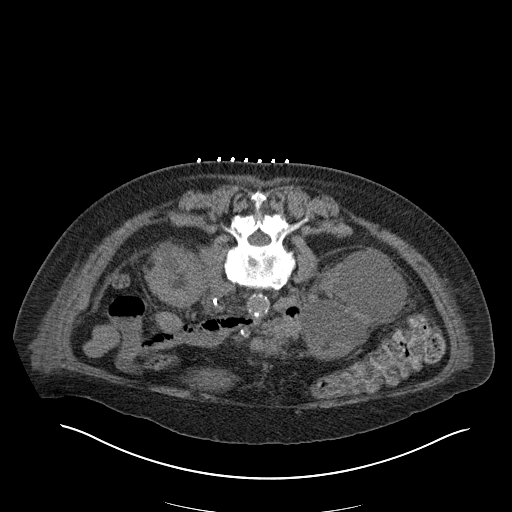
[im 22/33  soft-tissue]
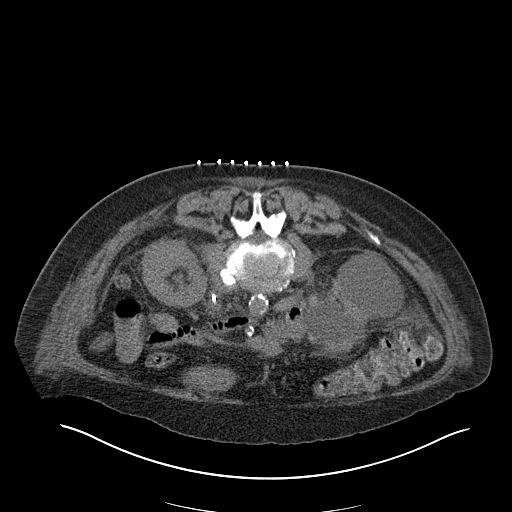
[im 22/33  lung]
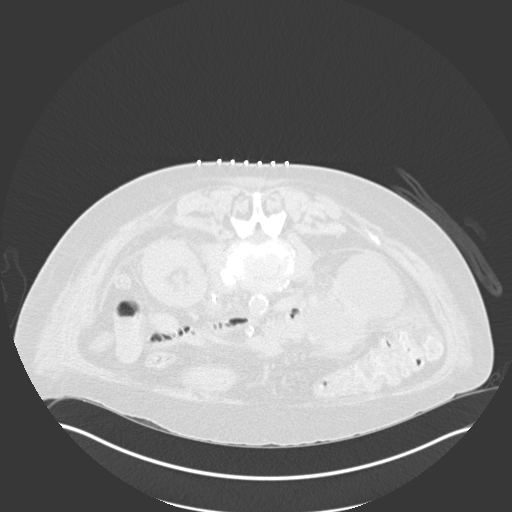
[im 25/33  soft-tissue]
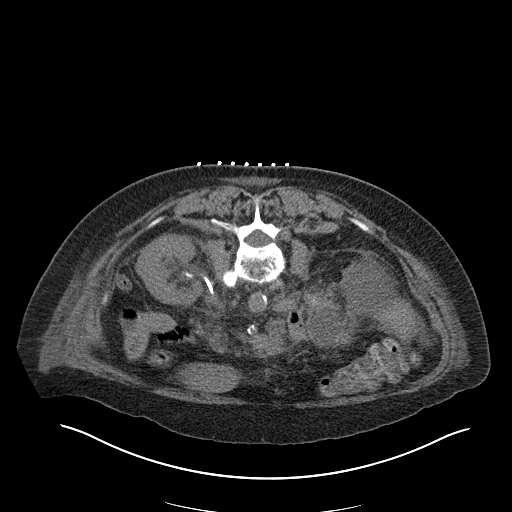
[im 25/33  lung]
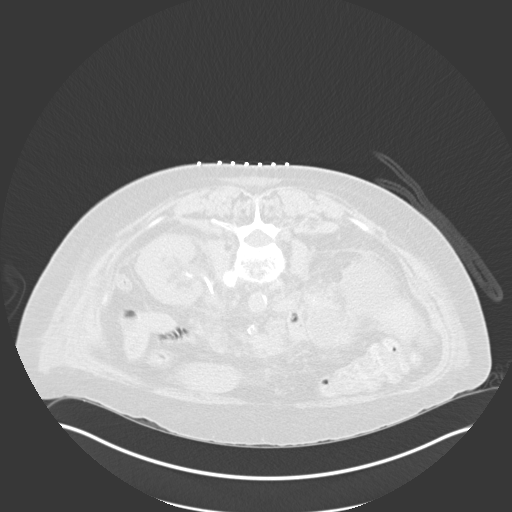
[im 25/33  bone]
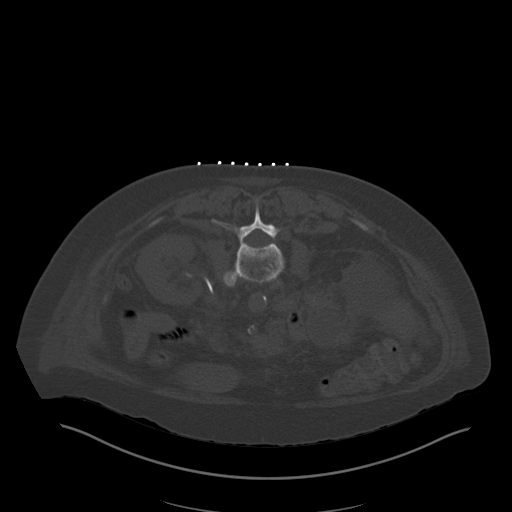
[im 27/33  soft-tissue]
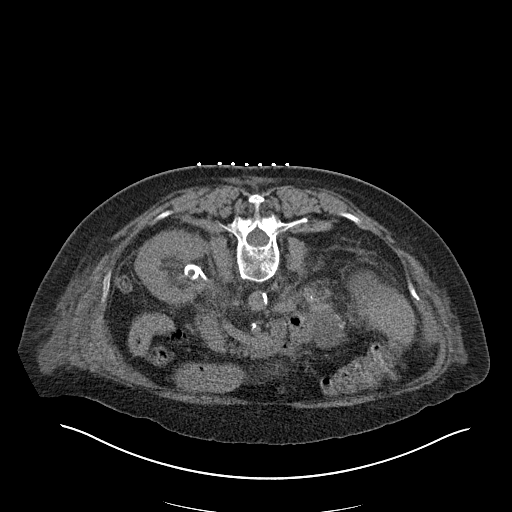
[im 27/33  lung]
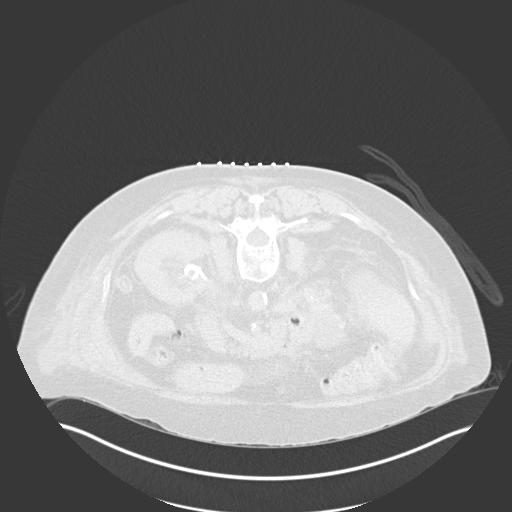
[im 30/33  soft-tissue]
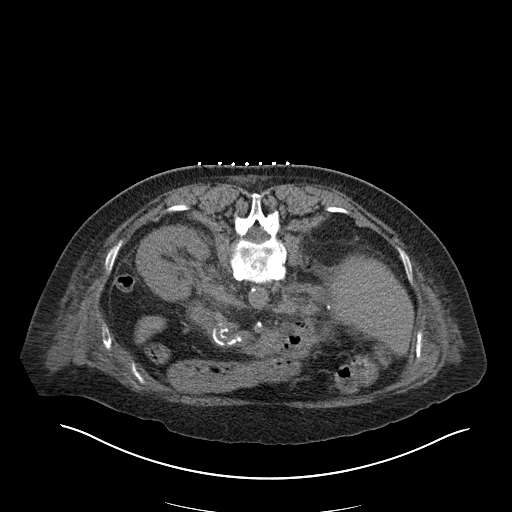
[im 30/33  lung]
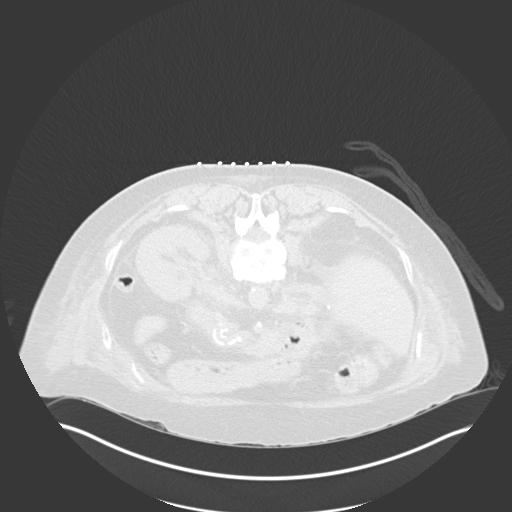

[Series 5: i-spiral 5.0 b30f · axial · 0.94mm/px · z∈[-380,-363]mm · 2 of 32 slices shown (2 of 2)]
[im 3/32  soft-tissue]
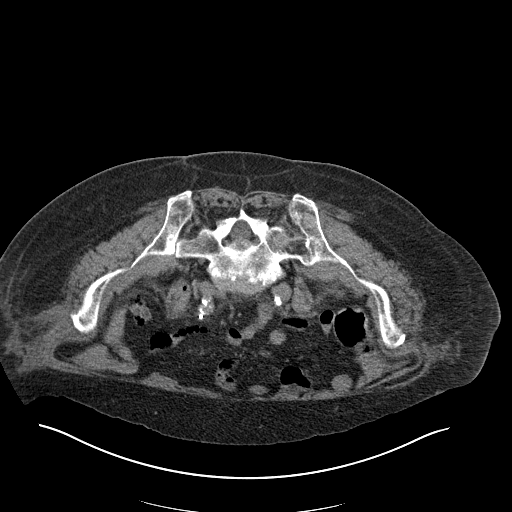
[im 8/32  soft-tissue]
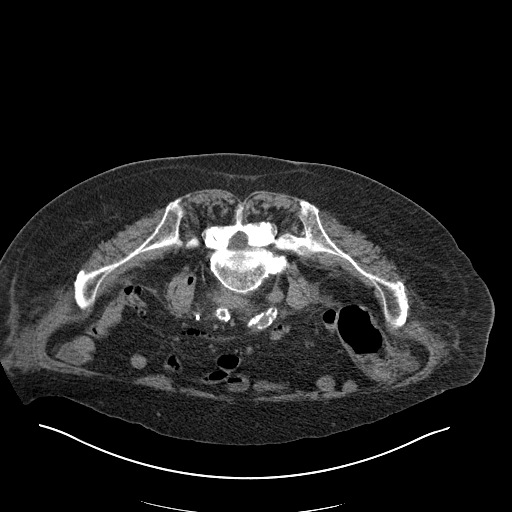

[13 of 32 positions shown; findings below may reference images not displayed]

MEDICATIONS:
None.

ANESTHESIA/SEDATION:
Fentanyl 50 mcg IV; Versed 1 mg IV

Sedation time: 16 minutes; The patient was continuously monitored
during the procedure by the interventional radiology nurse under my
direct supervision.

CONTRAST:  None.

COMPLICATIONS:
None immediate.

PROCEDURE:
Informed consent was obtained from the patient following an
explanation of the procedure, risks, benefits and alternatives. A
time out was performed prior to the initiation of the procedure.

The patient was positioned prone on the CT table and a limited CT
was performed for procedural planning demonstrating unchanged size
and appearance the left-sided retroperitoneal mass, presumed nodal
conglomeration with dominant component measuring approximately 4.5 x
2.6 cm (image 21, series 3). The procedure was planned. The
operative site was prepped and draped in the usual sterile fashion.
Appropriate trajectory was confirmed with a 22 gauge spinal needle
after the adjacent tissues were anesthetized with 1% Lidocaine with
epinephrine.

Under intermittent CT guidance, a 17 gauge coaxial needle was
advanced into the peripheral aspect of the mass.

Appropriate positioning was confirmed and 6 core needle biopsy
samples were obtained with an 18 gauge core needle biopsy device.
The co-axial needle was removed following the administration of a
Gel-Foam slurry and superficial hemostasis was achieved with manual
compression.

A limited postprocedural CT was negative for hemorrhage or
additional complication. A dressing was placed. The patient
tolerated the procedure well without immediate postprocedural
complication.
IMPRESSION: Technically successful CT guided core needle biopsy of left-sided
retroperitoneal mass, presumed nodal conglomeration.
